# Patient Record
Sex: Male | Born: 1940
Health system: Southern US, Community
[De-identification: ages and names within clinical notes are randomized; demographics above are authoritative.]

## PROBLEM LIST (undated history)

## (undated) DIAGNOSIS — G4730 Sleep apnea, unspecified: Secondary | ICD-10-CM

## (undated) DIAGNOSIS — J449 Chronic obstructive pulmonary disease, unspecified: Secondary | ICD-10-CM

## (undated) DIAGNOSIS — I509 Heart failure, unspecified: Secondary | ICD-10-CM

## (undated) DIAGNOSIS — C439 Malignant melanoma of skin, unspecified: Secondary | ICD-10-CM

## (undated) DIAGNOSIS — E119 Type 2 diabetes mellitus without complications: Secondary | ICD-10-CM

## (undated) DIAGNOSIS — M1990 Unspecified osteoarthritis, unspecified site: Secondary | ICD-10-CM

## (undated) DIAGNOSIS — I4892 Unspecified atrial flutter: Secondary | ICD-10-CM

## (undated) DIAGNOSIS — Z923 Personal history of irradiation: Secondary | ICD-10-CM

## (undated) DIAGNOSIS — IMO0001 Reserved for inherently not codable concepts without codable children: Secondary | ICD-10-CM

## (undated) DIAGNOSIS — H269 Unspecified cataract: Secondary | ICD-10-CM

## (undated) DIAGNOSIS — K635 Polyp of colon: Secondary | ICD-10-CM

## (undated) DIAGNOSIS — Z5189 Encounter for other specified aftercare: Secondary | ICD-10-CM

## (undated) DIAGNOSIS — H9190 Unspecified hearing loss, unspecified ear: Secondary | ICD-10-CM

## (undated) DIAGNOSIS — D649 Anemia, unspecified: Secondary | ICD-10-CM

## (undated) DIAGNOSIS — E7800 Pure hypercholesterolemia, unspecified: Secondary | ICD-10-CM

## (undated) DIAGNOSIS — K219 Gastro-esophageal reflux disease without esophagitis: Secondary | ICD-10-CM

## (undated) DIAGNOSIS — C3490 Malignant neoplasm of unspecified part of unspecified bronchus or lung: Secondary | ICD-10-CM

## (undated) DIAGNOSIS — I10 Essential (primary) hypertension: Secondary | ICD-10-CM

## (undated) DIAGNOSIS — G250 Essential tremor: Secondary | ICD-10-CM

## (undated) DIAGNOSIS — C61 Malignant neoplasm of prostate: Secondary | ICD-10-CM

## (undated) HISTORY — PX: CARDIAC CATHETERIZATION: SHX172

## (undated) HISTORY — PX: NECK SURGERY: SHX720

## (undated) HISTORY — DX: Gastro-esophageal reflux disease without esophagitis: K21.9

## (undated) HISTORY — DX: Heart failure, unspecified: I50.9

## (undated) HISTORY — DX: Chronic obstructive pulmonary disease, unspecified: J44.9

## (undated) HISTORY — DX: Anemia, unspecified: D64.9

## (undated) HISTORY — DX: Malignant neoplasm of unspecified part of unspecified bronchus or lung: C34.90

## (undated) HISTORY — PX: OTHER SURGICAL HISTORY: SHX169

## (undated) HISTORY — DX: Essential tremor: G25.0

## (undated) HISTORY — PX: CATARACT EXTRACTION: SUR2

## (undated) HISTORY — PX: PROSTATE SURGERY: SHX751

## (undated) HISTORY — DX: Malignant melanoma of skin, unspecified: C43.9

## (undated) HISTORY — PX: EYE SURGERY: SHX253

## (undated) HISTORY — DX: Encounter for other specified aftercare: Z51.89

## (undated) HISTORY — DX: Polyp of colon: K63.5

## (undated) HISTORY — DX: Unspecified cataract: H26.9

---

## 2002-10-22 ENCOUNTER — Encounter: Payer: Self-pay | Admitting: Orthopedic Surgery

## 2002-10-27 ENCOUNTER — Inpatient Hospital Stay (HOSPITAL_COMMUNITY): Admission: RE | Admit: 2002-10-27 | Discharge: 2002-10-30 | Payer: Self-pay | Admitting: Orthopedic Surgery

## 2002-10-30 ENCOUNTER — Inpatient Hospital Stay (HOSPITAL_COMMUNITY): Admission: EM | Admit: 2002-10-30 | Discharge: 2002-10-31 | Payer: Self-pay | Admitting: Emergency Medicine

## 2002-10-30 ENCOUNTER — Encounter: Payer: Self-pay | Admitting: Emergency Medicine

## 2002-10-31 ENCOUNTER — Encounter: Payer: Self-pay | Admitting: Internal Medicine

## 2002-11-17 ENCOUNTER — Encounter: Admission: RE | Admit: 2002-11-17 | Discharge: 2003-02-15 | Payer: Self-pay | Admitting: Orthopedic Surgery

## 2003-05-04 ENCOUNTER — Inpatient Hospital Stay (HOSPITAL_COMMUNITY): Admission: RE | Admit: 2003-05-04 | Discharge: 2003-05-07 | Payer: Self-pay | Admitting: Orthopedic Surgery

## 2003-05-25 ENCOUNTER — Encounter: Admission: RE | Admit: 2003-05-25 | Discharge: 2003-08-23 | Payer: Self-pay | Admitting: Orthopedic Surgery

## 2003-08-24 ENCOUNTER — Encounter: Admission: RE | Admit: 2003-08-24 | Discharge: 2003-08-25 | Payer: Self-pay | Admitting: Orthopedic Surgery

## 2005-07-03 HISTORY — PX: TOTAL HIP ARTHROPLASTY: SHX124

## 2005-11-21 ENCOUNTER — Inpatient Hospital Stay (HOSPITAL_COMMUNITY): Admission: RE | Admit: 2005-11-21 | Discharge: 2005-11-24 | Payer: Self-pay | Admitting: Orthopedic Surgery

## 2008-08-03 HISTORY — PX: COLONOSCOPY: SHX174

## 2009-07-03 HISTORY — PX: OTHER SURGICAL HISTORY: SHX169

## 2009-08-26 ENCOUNTER — Ambulatory Visit (HOSPITAL_COMMUNITY): Admission: RE | Admit: 2009-08-26 | Discharge: 2009-08-27 | Payer: Self-pay | Admitting: Orthopedic Surgery

## 2009-09-06 ENCOUNTER — Encounter: Admission: RE | Admit: 2009-09-06 | Discharge: 2009-12-07 | Payer: Self-pay | Admitting: Orthopedic Surgery

## 2009-12-08 ENCOUNTER — Encounter: Admission: RE | Admit: 2009-12-08 | Discharge: 2010-03-08 | Payer: Self-pay | Admitting: Orthopedic Surgery

## 2010-09-22 LAB — COMPREHENSIVE METABOLIC PANEL
ALT: 19 U/L (ref 0–53)
AST: 21 U/L (ref 0–37)
Albumin: 3.9 g/dL (ref 3.5–5.2)
Alkaline Phosphatase: 48 U/L (ref 39–117)
BUN: 8 mg/dL (ref 6–23)
CO2: 29 mEq/L (ref 19–32)
Calcium: 9.1 mg/dL (ref 8.4–10.5)
Chloride: 104 mEq/L (ref 96–112)
Creatinine, Ser: 0.87 mg/dL (ref 0.4–1.5)
GFR calc Af Amer: >60 mL/min (ref >60)
GFR calc non Af Amer: >60 mL/min (ref >60)
Glucose, Bld: 169 mg/dL — ABNORMAL HIGH (ref 70–99)
Potassium: 3.9 mEq/L (ref 3.5–5.1)
Sodium: 138 mEq/L (ref 135–145)
Total Bilirubin: 0.8 mg/dL (ref 0.3–1.2)
Total Protein: 6.4 g/dL (ref 6.0–8.3)

## 2010-09-22 LAB — PROTIME-INR
INR: 1.06 (ref 0.00–1.49)
Prothrombin Time: 13.7 seconds (ref 11.6–15.2)

## 2010-09-22 LAB — URINE MICROSCOPIC-ADD ON

## 2010-09-22 LAB — GLUCOSE, CAPILLARY
Glucose-Capillary: 105 mg/dL — ABNORMAL HIGH (ref 70–99)
Glucose-Capillary: 169 mg/dL — ABNORMAL HIGH (ref 70–99)
Glucose-Capillary: 225 mg/dL — ABNORMAL HIGH (ref 70–99)
Glucose-Capillary: 251 mg/dL — ABNORMAL HIGH (ref 70–99)
Glucose-Capillary: 86 mg/dL (ref 70–99)

## 2010-09-22 LAB — URINALYSIS, ROUTINE W REFLEX MICROSCOPIC
Bilirubin Urine: NEGATIVE
Glucose, UA: >1000 mg/dL — AB
Hgb urine dipstick: NEGATIVE
Ketones, ur: NEGATIVE mg/dL
Leukocytes, UA: NEGATIVE
Nitrite: NEGATIVE
Protein, ur: NEGATIVE mg/dL
Specific Gravity, Urine: 1.019 (ref 1.005–1.030)
Urobilinogen, UA: 0.2 mg/dL (ref 0.0–1.0)
pH: 7.5 (ref 5.0–8.0)

## 2010-09-22 LAB — CBC
HCT: 46.4 % (ref 39.0–52.0)
Hemoglobin: 15.7 g/dL (ref 13.0–17.0)
MCHC: 33.9 g/dL (ref 30.0–36.0)
MCV: 93.2 fL (ref 78.0–100.0)
Platelets: 202 10*3/uL (ref 150–400)
RBC: 4.97 MIL/uL (ref 4.22–5.81)
RDW: 13.1 % (ref 11.5–15.5)
WBC: 7.1 10*3/uL (ref 4.0–10.5)

## 2010-09-22 LAB — APTT: aPTT: 28 seconds (ref 24–37)

## 2010-11-18 NOTE — Op Note (Signed)
NAME:  Timothy Guerrero, Timothy Guerrero NO.:  [id number]   MEDICAL RECORD NO.:  [id number]          PATIENT TYPE:  INP   LOCATION:  0007                         FACILITY:  [facility]   PHYSICIAN:  Ollen Gross, M.D.    DATE OF BIRTH:  January 04, 1941   DATE OF PROCEDURE:  11/21/2005  DATE OF DISCHARGE:                                 OPERATIVE REPORT   PREOPERATIVE DIAGNOSIS:  Osteoarthritis, left hip.   POSTOPERATIVE DIAGNOSIS:  Osteoarthritis, left hip.   PROCEDURE:  Left total hip arthroplasty.   SURGEON:  Ollen Gross, M.D.   ASSISTANT:  Alexzandrew L. Julien Girt, P.A.   ANESTHESIA:  General.   ESTIMATED BLOOD LOSS:  300.   DRAINS:  Hemovac times one.   COMPLICATIONS:  None.  Condition stable to recovery.   BRIEF CLINICAL NOTE:  Timothy Guerrero is a 70 year old male with end-stage  osteoarthritis of left hip with intractable pain.  He presents now for left  total hip arthroplasty.   PROCEDURE IN DETAIL:  After successful administration of general anesthetic,  the patient was placed in right lateral decubitus position, left side up and  held with the hip positioner.  Left lower extremity was isolated from his  perineum with plastic drapes and prepped and draped in usual sterile  fashion.  Short posterolateral incision is made with 10 blade through subcu  tissue to level of the fascia lata which was incised in line with the skin  incision.  Sciatic nerve is palpated and protected and short rotators  isolated off the femur.  Capsulectomy is performed and hip dislocated.  Center of femoral head is marked and a trial prosthesis placed such that the  center of trial head corresponds to the center of his native femoral head.  Osteotomy lines marked on the femoral neck and osteotomy made with an  oscillating saw.  Femoral head removed and the hip retracted anteriorly to  gain acetabular closure.   Acetabular tractors were placed.  Acetabular osteophytes and labrum were  removed.   Acetabular reaming begins at 47 mm coursing in increments of 2 up  to 57 mm and a 58 mm Pinnacle acetabular shell was placed in anatomic  position and transfixed with two dome screws.  Trial 36 mm neutral liner was  placed.   The femur was prepared with the canal finder irrigation.  Axial reaming is  performed up to 15.5 mm, proximal reaming to a 20 F.  The sleeve machine to  a large.  20 F large sleeve was placed with a 20 x 15 stem and 36 + 12 neck.  We matched his native anteversion.  The 36+ 0 head and reduced quite easily.  With 36 + 6 the tension was more appropriate.  He had full extension, full  external rotation 70 degrees flexion, 40 degrees abduction, 90 degrees  internal rotation, 90 degrees flexion, 70 degrees internal rotation.  By  placing the left leg on top of the right, he was still a few millimeters  short.  At this time I decided to proceed with the extra large head and  liner  going to 40 mm.  This lead to more effective restoration of leg  length.  The trials were then removed and the permanent apex hole eliminator  was placed in the acetabular shell.  Permanent 40 mm neutral Ultamet metal  liner was placed.  It is a metal-on-metal hip replacement.  The permanent 20  F large sleeve is placed in the proximal femur with a 20 x 15 stem and 36  plus 12 neck again matching his native anteversion.  40 + 6 head is placed  and the hip is reduced to the same stability parameters.  Leg lengths were  then equalized.  Wound was copiously irrigated with saline solution and the  short rotators reattached the femur through drill holes.  Fascia was closed  over Hemovac drain with interrupted #1 Vicryl, subcu closed #1-0 and #2-0  Vicryl subcuticular running 4-0 Monocryl.  Incisions cleaned and dried and  Steri-Strips and bulky sterile dressing applied.  He was then awakened and  transported to recovery in stable condition.      Ollen Gross, M.D.  Electronically Signed      FA/MEDQ  D:  11/21/2005  T:  11/22/2005  Job:  [id number]

## 2010-11-18 NOTE — H&P (Signed)
NAME:  Timothy Guerrero, Timothy Guerrero                ACCOUNT NO.:  [id number]   MEDICAL RECORD NO.:  [id number]          PATIENT TYPE:  INP   LOCATION:  NA                           FACILITY:  [facility]   PHYSICIAN:  Ollen Gross, M.D.    DATE OF BIRTH:  10/20/1940   DATE OF ADMISSION:  11/21/2005  DATE OF DISCHARGE:                                HISTORY & PHYSICAL   DATE OF OFFICE VISIT HISTORY AND PHYSICAL:  Nov 08, 2005.   CHIEF COMPLAINT:  Left hip pain.   HISTORY OF PRESENT ILLNESS:  This is a 70 year old male who has been seen by  Dr. Lequita Halt for ongoing knee pain and also hip pain.  He previously has  undergone a right total knee replacement back in October of 2004, and also a  left total knee replacement about a year ago.  He has done extremely well  with both procedures.  Unfortunately, he has gone on to have continued  problems with his left hip, especially over the past 6 months.  He was seen  in the office where x-rays show end-stage arthritis of the left hip with  bone-on-bone formation.  The right hip looks normal.  It is felt the most  predictable means of improvement in his pain and function is hip  replacement.  Risks and benefits have been discussed, and the patient has  elected to proceed with surgery.   ALLERGIES:  CODEINE causes a rash.   CURRENT MEDICATIONS:  1.  Glipizide.  2.  Hydrochlorothiazide.  3.  Metformin.  4.  Atenolol.  5.  Lisinopril.   PAST MEDICAL HISTORY:  1.  Hypertension.  2.  History of prostate cancer.  3.  Type 2 diabetes mellitus.  4.  Benign essential tremor.   PAST SURGICAL HISTORY:  1.  Prostate surgery.  2.  Left knee ligament surgery.  3.  Left total knee arthroplasty.  4.  Right total knee arthroplasty.   SOCIAL HISTORY:  Married.  Retired.  Nonsmoker.  No alcohol.  Has 5  children.   FAMILY HISTORY:  Significant for a sister with hypertension.  Also a history  of liver disease in the family.   REVIEW OF SYSTEMS:  GENERAL:  No fever,  chills, night sweats.  NEUROLOGIC:  No seizures, syncope or paralysis.  RESPIRATORY:  No shortness of breath,  productive cough or hemoptysis.  CARDIOVASCULAR:  No chest pain, angina or  orthopnea.  GI:  No nausea, vomiting, diarrhea or constipation.  GU:  No  dysuria, hematuria or discharge.  MUSCULOSKELETAL:  Left hip.   PHYSICAL EXAMINATION:  VITAL SIGNS:  Pulse of 56, respirations 12, blood  pressure 142/72.  GENERAL:  A 70 year old white male, well-nourished, well-developed, in no  acute distress.  Alert, oriented and cooperative.  HEENT:  Normocephalic and atraumatic.  Pupils round and reactive.  Oropharynx clear.  Extraocular movements intact.  Does have partial lower  denture.  NECK:  Supple.  CHEST:  Clear, anterior and posterior chest walls.  No rhonchi, rales or  wheezing.  HEART:  Regular rate and rhythm.  No murmur.  S1 and S2 noted.  ABDOMEN:  Soft, nontender.  Bowel sounds present.  RECTAL/BREASTS/GENITALIA:  Not done.  No pertinent to present illness.  EXTREMITIES:  Left hip shows flexion of only 95 degrees.  There is zero  internal rotation, 20 degrees of external rotation, about 20 degrees of  abduction.   IMPRESSION:  1.  Osteoarthritis, left hip.  2.  Hypertension.  3.  History of prostate cancer.  4.  Type 2 diabetes mellitus.  5.  Benign essential tremor.   PLAN:  The patient was admitted to [facility] to undergo a left  total hip arthroplasty.  Surgery will be performed by Dr. Ollen Gross.      Alexzandrew L. Julien Girt, P.A.      Ollen Gross, M.D.  Electronically Signed    ALP/MEDQ  D:  11/20/2005  T:  11/20/2005  Job:  [id number]

## 2010-11-18 NOTE — Consult Note (Signed)
   NAME:  Timothy Guerrero, Timothy Guerrero                    ACCOUNT NO.:  [id number]   MEDICAL RECORD NO.:  [id number]                   PATIENT TYPE:  INP   LOCATION:  0004                                 FACILITY:  [facility]   PHYSICIAN:  Maretta Bees. Vonita Moss, M.D.             DATE OF BIRTH:  07-01-1941   DATE OF CONSULTATION:  10/27/2002  DATE OF DISCHARGE:                                   CONSULTATION   REASON FOR CONSULTATION:  I was asked to see this 70 year old white male  after he was put to sleep for knee surgery.  His Foley catheter could not be  inserted.  Review of the records reveal that he had a radical retropubic  prostatectomy in the past, and while checking the records that he very well  be my patient.  The OR staff could not insert a catheter which was necessary  for postoperative care.   PHYSICAL EXAMINATION:  ABDOMEN:  Soft and nontender.  GENITOURINARY:  Penis, urethra, meatus, scrotum, testicles, epididymis were  unremarkable.  RECTAL:  Prostate __________ rectal examination.   I inserted a Foley catheter.  It triggered a hang up at the bladder neck, so  I then dilated him with __________ from 16 to 20 [place], and then inserted a  16 [place] two-day catheter with return of clear urine.  Foley catheter was  attached to closed drainage, and we will follow this gentleman  postoperatively.                                               Maretta Bees. Vonita Moss, M.D.    LJP/MEDQ  D:  10/27/2002  T:  10/27/2002  Job:  [id number]   cc:   Ollen Gross, M.D.  [address]  [place]  [place] [address]  Fax: [phone]

## 2010-11-18 NOTE — Discharge Summary (Signed)
Timothy Guerrero, Timothy Guerrero                ACCOUNT NO.:  [id number]   MEDICAL RECORD NO.:  [id number]          PATIENT TYPE:  INP   LOCATION:  1605                         FACILITY:  [facility]   PHYSICIAN:  Ollen Gross, M.D.    DATE OF BIRTH:  [birth year]   DATE OF ADMISSION:  11/21/2005  DATE OF DISCHARGE:  11/24/2005                                 DISCHARGE SUMMARY   ADMISSION DIAGNOSES:  1.  Osteoarthritis of the left hip.  2.  Hypertension.  3.  History of prostate cancer.  4.  Type 2 diabetes mellitus.  5.  Benign essential tremor.   DISCHARGE DIAGNOSES:  1.  Osteoarthritis of the left hip status post left total hip arthroplasty.  2.  Mild postoperative blood loss anemia, did not require transfusion.  3.  Postoperative hypokalemia.  4.  Hypertension.  5.  History of prostate cancer.  6.  Type 2 diabetes mellitus.  7.  Benign essential tremor.   PROCEDURE:  On Nov 21, 2005 left total hip surgery by Dr. Lequita Halt, assistant  Alexzandrew Julien Girt, P. A.  Anesthesia, general.   CONSULTANTS:  None.   BRIEF HISTORY:  Timothy Guerrero is a 70 year old male with end-stage  osteoarthritis of the left hip with intractable pain who now presents for  total hip arthroplasty.   LABORATORY DATA:  Hemoglobin 15.5, hematocrit 45.7, hemoglobin dropped to  12.3.  Last admission it was 11.6 and 33.4.  PT and PTT preop 12.8 and 28  respectively.  INR 0.9.  Serial pro time is followed __________ and 1.7.  Chem panel on admission all within normal limits with the exception of  elevated glucose of 266.  Serial BMETs were followed.  His potassium did  drop from 4.1 to 3.7, last noted at 3.2.  Glucose improved to 135 and came  back up to 200 prior to discharge.  Preop UA positive glucose otherwise  negative.  Blood group type O positive.  EKG May 2007, unable to read date,  sinus rhythm, occasional premature contractures otherwise normal,  unconfirmed.  Left hip films Nov 15, 2005 advanced degenerative OA  changes  left hip.  Two-view chest Nov 15, 2005, mild chronic interstitial changes,  no acute cardiopulmonary process.  Left hip and pelvis films Nov 21, 2005,  satisfactory clearance left total hip.   HOSPITAL COURSE:  Admitted to [facility], tolerated the procedure  well, later to the recovery room and orthopedic floor.  Started on PCA and  p.o. analgesic for pain control following surgery.  Doing okay but was not  able to get much sleep and stayed in the PACU through the night and then  later transferred to the floor later that day, had been seen in rounds by  Dr. Lequita Halt.  Hemovac drain was pulled.  PCA was discontinued later that  morning.  Fluids were decreased.  Started to get up out of bed with physical  therapy and once she got up to the floor by day 2 she was doing much better,  under better control, being weaned over to p.o. meds, actually got up an  ambulated 120 feet, doing very well.  Dressings were changed.  Incision  looked good.  Doing so well felt to be ready to go home the following day.  Seen in rounds and discharged home on Nov 24, 2005.   DISCHARGE PLANS:  1.  The patient is discharged home on Nov 24, 2005.  2.  Discharge diagnosis, please see above.   DISCHARGE MEDICATIONS:  Coumadin, Percocet and Robaxin.   DIET:  Diabetic diet.   FOLLOW UP:  Follow up in 2 weeks.  Call for an appointment.   ACTIVITY:  Partial weightbearing 25-50% left lower extremity.  Hip  precautions, total hip protocol.   DISPOSITION:  Home.   CONDITION ON DISCHARGE:  Stable and improved.      Alexzandrew L. Julien Girt, P.A.      Ollen Gross, M.D.  Electronically Signed    ALP/MEDQ  D:  01/03/2006  T:  01/03/2006  Job:  [id number]   cc:   Ollen Gross, M.D.  Fax: [phone]

## 2013-06-13 ENCOUNTER — Emergency Department (HOSPITAL_COMMUNITY): Payer: Medicare Other

## 2013-06-13 ENCOUNTER — Emergency Department (HOSPITAL_COMMUNITY)
Admission: EM | Admit: 2013-06-13 | Discharge: 2013-06-14 | Disposition: A | Discharge status: Home / Self Care | Payer: Medicare Other | Attending: Emergency Medicine | Admitting: Emergency Medicine

## 2013-06-13 ENCOUNTER — Encounter (HOSPITAL_COMMUNITY): Payer: Self-pay | Admitting: Emergency Medicine

## 2013-06-13 DIAGNOSIS — E119 Type 2 diabetes mellitus without complications: Secondary | ICD-10-CM | POA: Insufficient documentation

## 2013-06-13 DIAGNOSIS — I10 Essential (primary) hypertension: Secondary | ICD-10-CM | POA: Insufficient documentation

## 2013-06-13 DIAGNOSIS — Z862 Personal history of diseases of the blood and blood-forming organs and certain disorders involving the immune mechanism: Secondary | ICD-10-CM | POA: Insufficient documentation

## 2013-06-13 DIAGNOSIS — M19029 Primary osteoarthritis, unspecified elbow: Secondary | ICD-10-CM | POA: Insufficient documentation

## 2013-06-13 DIAGNOSIS — Z8639 Personal history of other endocrine, nutritional and metabolic disease: Secondary | ICD-10-CM | POA: Insufficient documentation

## 2013-06-13 DIAGNOSIS — M7021 Olecranon bursitis, right elbow: Secondary | ICD-10-CM

## 2013-06-13 DIAGNOSIS — M7020 Olecranon bursitis, unspecified elbow: Secondary | ICD-10-CM | POA: Insufficient documentation

## 2013-06-13 HISTORY — DX: Pure hypercholesterolemia, unspecified: E78.00

## 2013-06-13 HISTORY — DX: Type 2 diabetes mellitus without complications: E11.9

## 2013-06-13 HISTORY — DX: Essential (primary) hypertension: I10

## 2013-06-13 MED ORDER — IBUPROFEN 400 MG PO TABS
400.0000 mg | ORAL_TABLET | Freq: Once | ORAL | Status: AC
  Administered 2013-06-13: 400 mg via ORAL
  Filled 2013-06-13: qty 1

## 2013-06-13 MED ORDER — HYDROCODONE-ACETAMINOPHEN 5-325 MG PO TABS
1.0000 | ORAL_TABLET | Freq: Once | ORAL | Status: DC
  Filled 2013-06-13: qty 1

## 2013-06-13 NOTE — ED Provider Notes (Signed)
CSN: [id number]     Arrival date & time 06/13/13  2156 History   First MD Initiated Contact with Patient 06/13/13 2235     Chief Complaint  Patient presents with  . Elbow Pain   (Consider location/radiation/quality/duration/timing/severity/associated sxs/prior Treatment) HPI Comments: Chief Complaint: right elbow pain/swelling. Pt is a 72 y/o male  who presents to the ED with c/o right elbow pain. Pt states he noticed this today about 3pm, and the swelling and pain have been getting worse since that time. Pt does not recall any injury to the elbow. He did notice a abrasion yesterday. No reported fever or chills. No previous operations or procedures reported.No problem with the left elbow. Pt does state he had a similar problem with the right knee and had this drained at the [facility] office.  The history is provided by the patient.    Past Medical History  Diagnosis Date  . Diabetes mellitus without complication   . Hypertension   . Hypercholesteremia    Past Surgical History  Procedure Laterality Date  . Prostate surgery    . Joint replacement    . Hip and knee replacement.      . Neck surgery     History reviewed. No pertinent family history. History  Substance Use Topics  . Smoking status: Never Smoker   . Smokeless tobacco: Not on file  . Alcohol Use: No    Review of Systems  Constitutional: Negative for activity change.       All ROS Neg except as noted in HPI  HENT: Negative for nosebleeds.   Eyes: Negative for photophobia and discharge.  Respiratory: Negative for cough, shortness of breath and wheezing.   Cardiovascular: Negative for chest pain and palpitations.  Gastrointestinal: Negative for abdominal pain and blood in stool.  Genitourinary: Negative for dysuria, frequency and hematuria.  Musculoskeletal: Positive for arthralgias. Negative for back pain and neck pain.  Skin: Negative.   Neurological: Negative for dizziness, seizures and speech  difficulty.  Psychiatric/Behavioral: Negative for hallucinations and confusion.    Allergies  Review of patient's allergies indicates no known allergies.  Home Medications  No current outpatient prescriptions on file. BP 162/76  Pulse 81  Temp(Src) 98.6 F (37 C) (Oral)  Resp 20  Ht 6' (1.829 m)  Wt 215 lb (97.523 kg)  BMI 29.15 kg/m2  SpO2 97% Physical Exam  Nursing note and vitals reviewed. Constitutional: He is oriented to person, place, and time. He appears well-developed and well-nourished.  Non-toxic appearance.  HENT:  Head: Normocephalic.  Right Ear: Tympanic membrane and external ear normal.  Left Ear: Tympanic membrane and external ear normal.  Eyes: EOM and lids are normal. Pupils are equal, round, and reactive to light.  Neck: Normal range of motion. Neck supple. Carotid bruit is not present.  Cardiovascular: Normal rate, regular rhythm, normal heart sounds, intact distal pulses and normal pulses.   Pulmonary/Chest: Breath sounds normal. No respiratory distress.  Abdominal: Soft. Bowel sounds are normal. There is no tenderness. There is no guarding.  Musculoskeletal: Normal range of motion.  Increase redness, and tenderness and mod swelling of the right elbow, mostly at the olecranon area.  Lymphadenopathy:       Head (right side): No submandibular adenopathy present.       Head (left side): No submandibular adenopathy present.    He has no cervical adenopathy.  Neurological: He is alert and oriented to person, place, and time. He has normal strength. No cranial nerve deficit  or sensory deficit.  Skin: Skin is warm and dry.  Psychiatric: He has a normal mood and affect. His speech is normal.    ED Course  Procedures (including critical care time) Labs Review Labs Reviewed - No data to display Imaging Review No results found.  EKG Interpretation   None       MDM  No diagnosis found. *I have reviewed nursing notes, vital signs, and all appropriate lab  and imaging results for this patient.** Xray of the elbow reveals DJD changes and question of a spur on the radial head. Temp 98.6, vital signs stable. Plan - keflex and doxycycline ordered. Continue ibuprofen 400mg  bid and add norco for pain. Pt to see MD at [facility] for additional evaluation. Pt placed in a sling.   Kathie Dike, PA-C 06/14/13 0010

## 2013-06-13 NOTE — ED Notes (Signed)
Red swollen rt elbow

## 2013-06-14 MED ORDER — CEPHALEXIN 500 MG PO CAPS
500.0000 mg | ORAL_CAPSULE | Freq: Once | ORAL | Status: AC
  Administered 2013-06-14: 500 mg via ORAL
  Filled 2013-06-14: qty 1

## 2013-06-14 MED ORDER — ONDANSETRON HCL 4 MG PO TABS
4.0000 mg | ORAL_TABLET | Freq: Once | ORAL | Status: AC
  Administered 2013-06-14: 4 mg via ORAL
  Filled 2013-06-14: qty 1

## 2013-06-14 MED ORDER — DOXYCYCLINE HYCLATE 100 MG PO TABS
100.0000 mg | ORAL_TABLET | Freq: Once | ORAL | Status: AC
  Administered 2013-06-14: 100 mg via ORAL
  Filled 2013-06-14: qty 1

## 2013-06-14 MED ORDER — DOXYCYCLINE HYCLATE 100 MG PO CAPS: 100.0000 mg | ORAL_CAPSULE | Freq: Two times a day (BID) | ORAL | Status: AC

## 2013-06-14 MED ORDER — CEPHALEXIN 500 MG PO CAPS: 500.0000 mg | ORAL_CAPSULE | Freq: Four times a day (QID) | ORAL | Status: DC

## 2013-06-14 MED ORDER — HYDROCODONE-ACETAMINOPHEN 5-325 MG PO TABS: 1.0000 | ORAL_TABLET | Freq: Four times a day (QID) | ORAL | Status: DC | PRN

## 2013-06-14 NOTE — ED Notes (Signed)
Pt alert & oriented x4, stable gait. Patient given discharge instructions, paperwork & prescription(s). Patient  instructed to stop at the registration desk to finish any additional paperwork. Patient verbalized understanding. Pt left department w/ no further questions. 

## 2013-06-14 NOTE — ED Provider Notes (Signed)
Elderly male with right bursal effusion of the elbow, minimally tender, no erythema or induration, no significant tenderness with range of motion of the elbow but does have tenderness with palpation over the olecranon. Normal grip, normal range of motion of the wrist and the shoulder, no other joint complaints. Afebrile. Pain medication, sling, orthopedic referral with antibiotic in case this has become infected. He does not have a septic arthritis though he may have infected bursa. Patient amenable to plan.  Medical screening examination/treatment/procedure(s) were conducted as a shared visit with non-physician practitioner(s) and myself.  I personally evaluated the patient during the encounter.        Vida Roller, MD 06/14/13 [phone]

## 2013-11-04 ENCOUNTER — Telehealth: Payer: Self-pay

## 2013-11-04 NOTE — Telephone Encounter (Signed)
Pt returned call. Scheduled for OV with Neil Crouch, PA on 12/08/2013 at 8:30 AM.

## 2013-11-04 NOTE — Telephone Encounter (Signed)
Pt was referred by [facility] in [place] for colonoscopy. Has hx of polyps. I called and left message for a return call to schedule OV first.

## 2013-11-05 NOTE — Telephone Encounter (Signed)
LMOM at the [facility] for Timothy Guerrero that date and time of the appt. [phone]).

## 2013-12-08 ENCOUNTER — Other Ambulatory Visit: Payer: Self-pay

## 2013-12-08 ENCOUNTER — Telehealth: Payer: Self-pay

## 2013-12-08 ENCOUNTER — Encounter: Payer: Self-pay | Admitting: Gastroenterology

## 2013-12-08 ENCOUNTER — Ambulatory Visit (INDEPENDENT_AMBULATORY_CARE_PROVIDER_SITE_OTHER): Payer: Medicare PPO | Admitting: Gastroenterology

## 2013-12-08 VITALS — BP 160/94 | HR 66 | Temp 97.2°F | Resp 20 | Ht 72.0 in | Wt 214.0 lb

## 2013-12-08 DIAGNOSIS — Z860100 Personal history of colon polyps, unspecified: Secondary | ICD-10-CM | POA: Diagnosis not present

## 2013-12-08 DIAGNOSIS — Z1211 Encounter for screening for malignant neoplasm of colon: Secondary | ICD-10-CM

## 2013-12-08 NOTE — Telephone Encounter (Signed)
Patient is straightforward without any problems. Limited time available given provider vacation. Ok to [employer].

## 2013-12-08 NOTE — Assessment & Plan Note (Signed)
Due for surveillance colonoscopy. He has already been given bowel prep and will call us with name so we can give instructions.  I have discussed the risks, alternatives, benefits with regards to but not limited to the risk of reaction to medication, bleeding, infection, perforation and the patient is agreeable to proceed. Written consent to be obtained.

## 2013-12-08 NOTE — Progress Notes (Signed)
Repeat BP improved as noted.

## 2013-12-08 NOTE — Telephone Encounter (Signed)
Pt called and said he has a jug of the Golytely and 4 ducolax tablets which are 5 mg each.  He is now scheduled for 01/12/2014 at 7:30 AM with Dr. Gala Romney for colonoscopy.  He had to be scheduled out since he is diabetic and needs an Am appt. The only thing Dr. Gala Romney had was on 12/25/2013 which would be needed for an OR procedure.   I have made a notation to update triage prior to his procedure.   Mailed the instructions.

## 2013-12-08 NOTE — Patient Instructions (Signed)
Colonoscopy as scheduled. See separate instructions.  

## 2013-12-08 NOTE — Progress Notes (Signed)
Primary Care Physician:  No PCP Per Patient  Primary Gastroenterologist:  Garfield Cornea, MD   Chief Complaint  Patient presents with  . Referral    new patient    HPI:  Timothy Guerrero is a 73 y.o. male here referred by the [facility] for surveillance colonoscopy with history of adenomatous colon polyps. Patient had a colonoscopy in February 2010 by Dr. Leafy Half at [facility]. He had internal grade 1 hemorrhoids. Prior colonoscopy by Dr. Marene Lenz in 2007 however showed polyp with early cancer requiring a one year followup exam which showed no residual polyp. Therefore patient is due for five-year surveillance colonoscopy.  He denies any constipation, diarrhea, melena, rectal bleeding, abdominal pain, vomiting, dysphagia. He seldom has heartburn and takes omeprazole when necessary.   Current Outpatient Prescriptions  Medication Sig Dispense Refill  . aspirin 325 MG tablet Take 325 mg by mouth daily.      [name] atenolol (TENORMIN) 100 MG tablet Take 100 mg by mouth daily.      [name] glipiZIDE (GLUCOTROL) 10 MG tablet Take 10 mg by mouth daily.      . hydrochlorothiazide (HYDRODIURIL) 25 MG tablet Take 25 mg by mouth daily.      [name] ibuprofen (ADVIL,MOTRIN) 400 MG tablet Take 400 mg by mouth 2 (two) times daily as needed. For pain      . lisinopril (PRINIVIL,ZESTRIL) 40 MG tablet Take 40 mg by mouth daily.      . metFORMIN (GLUCOPHAGE) 1000 MG tablet Take 1,000 mg by mouth 2 (two) times daily.      [name] omeprazole (PRILOSEC) 20 MG capsule Take 20 mg by mouth 2 (two) times daily as needed. For acid reflux      . simvastatin (ZOCOR) 40 MG tablet Take 40 mg by mouth at bedtime.       No current facility-administered medications for this visit.    Allergies as of 12/08/2013  . (No Known Allergies)    Past Medical History  Diagnosis Date  . Diabetes mellitus without complication   . Hypertension   . Hypercholesteremia   . Colon polyp     2007, polyp  with early cancer, one year f/u no residual polyp. next TCS 2010, see P[facility]. [facility].    Past Surgical History  Procedure Laterality Date  . Prostate surgery    . Knee replacements      bilateral. over 10 years ago  . Neck surgery    . Total hip arthroplasty  2007    left  . Total shoulder replacement  2011    left  . Colonoscopy  08/2008    Dr. Leafy Half, [place]: normal, internal grade 1 hemorrhoids. next TCS 08/2013    Family History  Problem Relation Age of Onset  . Colon cancer Neg Hx     History   Social History  . Marital Status: Legally Separated    Spouse Name: N/A    Number of Children: N/A  . Years of Education: N/A   Occupational History  . Not on file.   Social History Main Topics  . Smoking status: Never Smoker   . Smokeless tobacco: Not on file  . Alcohol Use: No  . Drug Use: No  . Sexual Activity: Not on file   Other Topics Concern  . Not on file   Social History Narrative   [employer].      ROS:  General: Negative  for anorexia, weight loss, fever, chills, fatigue, weakness. Eyes: Negative for vision changes.  ENT: Negative for hoarseness, difficulty swallowing , nasal congestion. CV: Negative for chest pain, angina, palpitations, dyspnea on exertion, peripheral edema.  Respiratory: Negative for dyspnea at rest, dyspnea on exertion, cough, sputum, wheezing.  GI: See history of present illness. GU:  Negative for dysuria, hematuria, urinary incontinence, urinary frequency, nocturnal urination.  MS: Negative for joint pain, low back pain.  Derm: Negative for rash or itching.  Neuro: Negative for weakness, abnormal sensation, seizure, frequent headaches, memory loss, confusion.  Psych: Negative for anxiety, depression, suicidal ideation, hallucinations.  Endo: Negative for unusual weight change.  Heme: Negative for bruising or bleeding. Allergy: Negative for rash or hives.    Physical Examination:  BP  191/90  Pulse 66  Temp(Src) 97.2 F (36.2 C) (Oral)  Resp 20  Ht 6' (1.829 m)  Wt 214 lb (97.07 kg)  BMI 29.02 kg/m2   General: Well-nourished, well-developed in no acute distress.  Head: Normocephalic, atraumatic.   Eyes: Conjunctiva pink, no icterus. Mouth: Oropharyngeal mucosa moist and pink , no lesions erythema or exudate. Neck: Supple without thyromegaly, masses, or lymphadenopathy.  Lungs: Clear to auscultation bilaterally.  Heart: Regular rate and rhythm, no murmurs rubs or gallops.  Abdomen: Bowel sounds are normal, nontender, nondistended, no hepatosplenomegaly or masses, no abdominal bruits or    hernia , no rebound or guarding.   Rectal: not performed Extremities: No lower extremity edema. No clubbing or deformities.  Neuro: Alert and oriented x 4 , grossly normal neurologically.  Skin: Warm and dry, no rash or jaundice.   Psych: Alert and cooperative, normal mood and affect.

## 2013-12-08 NOTE — Progress Notes (Signed)
No PCP 

## 2013-12-30 ENCOUNTER — Encounter (HOSPITAL_COMMUNITY): Payer: Self-pay | Admitting: Pharmacy Technician

## 2013-12-31 ENCOUNTER — Encounter: Payer: Self-pay | Admitting: Physician Assistant

## 2013-12-31 ENCOUNTER — Ambulatory Visit (INDEPENDENT_AMBULATORY_CARE_PROVIDER_SITE_OTHER): Payer: Commercial Managed Care - HMO | Admitting: Physician Assistant

## 2013-12-31 VITALS — BP 118/80 | HR 72 | Temp 97.9°F | Ht 72.0 in | Wt 206.0 lb

## 2013-12-31 DIAGNOSIS — C439 Malignant melanoma of skin, unspecified: Secondary | ICD-10-CM | POA: Insufficient documentation

## 2013-12-31 NOTE — Patient Instructions (Signed)
Melanoma Melanoma is the least common, but most dangerous, form of skin cancer. This is because it can spread (metastasize) to other organs and can be life-threatening. Melanoma is a cancerous (malignant) tumor that begins in a certain type of cells, called melanocytes. Melanocytes are the cells that produce the color (pigment) called melanin. Melanin colors our skin, hair, eyes, and moles. CAUSES  The exact cause of melanoma is unknown. You may have a higher risk if you:  Spend or have spent a lot of time in the sun. This includes sunlamp and tanning booth exposure.  Have had sunburns. This put you at a particularly increased risk for melanoma. The more blistering sunburns a person has, the higher the risk.  Spend time in parts of the world with more intense sunlight.  Have fair skin that does not tan easily. You may have a lower risk if you have a darker skin color. However, people with darker skin can get melanoma, especially on the hands and feet (acral areas).  Have a close relative (parent, sibling) who has melanoma.  Have a large number of skin moles (more than 100). SYMPTOMS  A skin mole is suspicious if it has any of these 5 traits. This is called the ABCDE's of melanoma:  Asymmetry: Irregular shape, not simply round or oval.  Border: Edge of the mole is irregular, not smooth.  Color: Mole may have multiple colors in it, including brown, black, blue, red, or tan.  Diameter: More than 0.2 inches (6 mm) across.  Evolving: Any unusual change or symptoms in the mole, such as pain, itching, stinging, sensitivity, or bleeding. A mole that is noticeably changing in appearance, or any new mole, should be checked for melanoma. In general, people develop new moles until age 76. New moles after this age should be brought to the attention of your caregiver. DIAGNOSIS  Your caregiver can look at your skin and find lesions or moles that may be suspicious. A patient may also notice a mole  with symptoms or a mole that does not look like most of the other moles on his or her body. This is called the "ugly duckling" sign. A tissue sample (biopsy) examined under a microscope is needed to determine if it is melanoma. The size and extent of the biopsy will depend on the location, size, and appearance of the skin lesion or mole. The biopsy can also reveal whether melanoma has spread to deeper layers of the skin. TREATMENT  Surgery to completely remove the melanoma is required. Lymph nodes may also be removed. If the melanoma has spread to other organs, such as the liver, lungs, bone, or brain, cancer-fighting drugs (chemotherapy) must be used. Your caregiver will discuss your treatment options with you. You can ask about being included in a clinical trial to evaluate new forms of treatment. Melanoma can occasionally recur years after the initial diagnosis. If you have melanoma, you will need follow-up visits with your caregiver for many years. PREVENTION  Risk for melanoma can be reduced by minimizing sun exposure. Practice the 3 S's:  Slip on a shirt.  Slop on sunscreen.  Slap on a hat. Do not spend time in the sun during peak midafternoon hours. Sunscreen/sunblock with SPF 30 or higher and UVA/UVB block should be applied regularly. You should do this even during brief exposure to sunlight. You should also do this on cloudy days and in winter, even though the perceived sunlight is less. Always avoid sunburn! Wear sunglasses that block UV  light. Be sure to see your caregiver if you have any new or changing moles. HOME CARE INSTRUCTIONS   Follow wound care instructions after surgical removal of your melanoma.  Practice good sun avoidance and protective measures as described above.  Let your close family members (parents, children, siblings) know about your diagnosis. This puts them at a higher risk of getting melanoma than the general population. SEEK MEDICAL CARE IF:   You notice any  new moles, or you have any moles that are changing.  You have had a melanoma removed and you notice a new growth near the same location.  You have had a melanoma removed and you experience any new or unexplained health problems. Document Released: 06/19/2005 Document Revised: 09/11/2011 Document Reviewed: 10/08/2009 [facility] Patient Information 2015 [place], [place]. This information is not intended to replace advice given to you by your health care provider. Make sure you discuss any questions you have with your health care provider.

## 2013-12-31 NOTE — Progress Notes (Signed)
Subjective:     Patient ID: Timothy Guerrero, male   DOB: September 17, 1940, 73 y.o.   MRN: [id number]  HPI Pt is here as a new pt He [facility] with a hx of melanoma to the L shoulder and post cerv region He is currently under a 3 month surveillance program The ins co he is currently with would not allow him to f/u w/o referral and they did not recognize the [place] physicians Pt just needing referral  Review of Systems  Skin: Negative.        Objective:   Physical Exam Well healed surgical lesions to the lateral L shoulder and L cervical area    Assessment:     Melanoma    Plan:     Referral back to the Derm F/U here prn

## 2014-01-07 ENCOUNTER — Telehealth: Payer: Self-pay

## 2014-01-07 NOTE — Telephone Encounter (Signed)
Pt was seen in the office by Neil Crouch, PA on 12/08/2013. Scheduled his colonoscopy for 01/12/2014 due to vacations and needed early appt due to being a diabetic.  I have called pt and he has not had any change in his medications and no new problems since he was seen in the office on 12/08/2013.

## 2014-01-08 ENCOUNTER — Telehealth: Payer: Self-pay

## 2014-01-08 NOTE — Telephone Encounter (Signed)
That should be fine. He is straightforward surveillance TCS for h/o polyps.

## 2014-01-08 NOTE — Telephone Encounter (Signed)
Pt called and said he is not feeling good, seems to have a virus with chills, fever and no appetite.  He has rescheduled his colonoscopy from 01/12/2014 to 02/11/2014 at 9:30 AM and is aware to be at the hospital at 8:30 AM. Maudie Mercury is aware.  He is aware that I will have to update triage prior to that appt.

## 2014-01-08 NOTE — Telephone Encounter (Signed)
Routing to Dr. Rourk  

## 2014-01-20 ENCOUNTER — Ambulatory Visit (INDEPENDENT_AMBULATORY_CARE_PROVIDER_SITE_OTHER): Payer: Commercial Managed Care - HMO | Admitting: *Deleted

## 2014-01-20 DIAGNOSIS — Z4800 Encounter for change or removal of nonsurgical wound dressing: Secondary | ICD-10-CM

## 2014-01-20 NOTE — Progress Notes (Signed)
Spoke to patient at front desk. He noticed a possible insect bite to right hand close to wrist 1 week ago while vacationing in [place]. He is concerned that it may be a spider bite. Advised that it is difficult to determine what type of insect unless it was witnessed.  There is a round area of localized erythema approximately 2.5cm wide and an area to the center of approx 2 cm where the top layer of skin has peeled off. The tissue appears healthy and healing. It is a dry wound. No induration, necrosis, drainage, pain or warmth.  Advised patient to continue keeping the area clean with soap and water and to apply an antibiotic ointment and dressing. He should keep it covered most of the day but can remove the bandage at night if he wishes. Offered to clean and dress the wound but patient stated that he has supplies at home and can redress it himself.   Area of erythema was marked with a pen. Advised patient to monitor the size. If it increases in size or if he develops any streaking, increased redness, pain, drainage or warmth he will need to be seen. Patient stated understanding and agreement to plan.

## 2014-02-02 ENCOUNTER — Telehealth: Payer: Self-pay

## 2014-02-02 NOTE — Telephone Encounter (Signed)
Appropriate. Hold diabetes medications the day of the procedure.

## 2014-02-02 NOTE — Telephone Encounter (Signed)
Pt is scheduled for colonoscopy on 02/11/2014. I called to update triage. Pt said he has not had any change in meds and no new medical problems since he was scheduled.  Routing to Laban Emperor, NP to sign off since Neil Crouch, [place] is on vacation.

## 2014-02-04 NOTE — Telephone Encounter (Signed)
Pt has his instructions.

## 2014-02-06 ENCOUNTER — Telehealth: Payer: Self-pay

## 2014-02-06 NOTE — Telephone Encounter (Signed)
Pt called to cancel his TCS on 02/11/14. He just does not want to have it done. Left Kim a message.

## 2014-02-07 NOTE — Telephone Encounter (Signed)
noted 

## 2014-02-09 NOTE — Telephone Encounter (Signed)
noted 

## 2014-02-11 ENCOUNTER — Other Ambulatory Visit: Payer: Self-pay

## 2014-02-11 ENCOUNTER — Ambulatory Visit (HOSPITAL_COMMUNITY): Admission: RE | Admit: 2014-02-11 | Payer: Non-veteran care | Source: Ambulatory Visit | Admitting: Internal Medicine

## 2014-02-11 ENCOUNTER — Encounter (HOSPITAL_COMMUNITY): Admission: RE | Payer: Self-pay | Source: Ambulatory Visit

## 2014-02-11 DIAGNOSIS — Z1211 Encounter for screening for malignant neoplasm of colon: Secondary | ICD-10-CM

## 2014-02-11 SURGERY — COLONOSCOPY
Anesthesia: Moderate Sedation

## 2014-02-11 NOTE — Telephone Encounter (Signed)
Noted  

## 2014-02-11 NOTE — Telephone Encounter (Signed)
Pt called back to reschedule his appt for his colonoscopy. Previously scheduled for today, but he had called and cancelled.  He called today and said he now has someone who can go with him to the hospital for the procedure. He has been rescheduled for 03/04/2014 at 7:30 AM with Dr. Gala Romney. He is aware to be at the hospital at 6:30 AM.  He has his prep and had misplaced his instructions so I am mailing him new instructions.  He said he has not had any change in his meds since he was triaged.

## 2014-02-17 ENCOUNTER — Encounter (HOSPITAL_COMMUNITY): Payer: Self-pay | Admitting: Pharmacy Technician

## 2014-03-04 ENCOUNTER — Ambulatory Visit (HOSPITAL_COMMUNITY)
Admission: RE | Admit: 2014-03-04 | Discharge: 2014-03-04 | Disposition: A | Discharge status: Home / Self Care | Payer: Non-veteran care | Source: Ambulatory Visit | Attending: Internal Medicine | Admitting: Internal Medicine

## 2014-03-04 ENCOUNTER — Encounter (HOSPITAL_COMMUNITY)
Admission: RE | Disposition: A | Discharge status: Home / Self Care | Payer: Self-pay | Source: Ambulatory Visit | Attending: Internal Medicine

## 2014-03-04 ENCOUNTER — Encounter (HOSPITAL_COMMUNITY): Payer: Self-pay | Admitting: *Deleted

## 2014-03-04 DIAGNOSIS — Z860100 Personal history of colon polyps, unspecified: Secondary | ICD-10-CM

## 2014-03-04 DIAGNOSIS — E7800 Pure hypercholesterolemia, unspecified: Secondary | ICD-10-CM | POA: Insufficient documentation

## 2014-03-04 DIAGNOSIS — Z96619 Presence of unspecified artificial shoulder joint: Secondary | ICD-10-CM | POA: Diagnosis not present

## 2014-03-04 DIAGNOSIS — Z96659 Presence of unspecified artificial knee joint: Secondary | ICD-10-CM | POA: Insufficient documentation

## 2014-03-04 DIAGNOSIS — E119 Type 2 diabetes mellitus without complications: Secondary | ICD-10-CM | POA: Diagnosis not present

## 2014-03-04 DIAGNOSIS — D126 Benign neoplasm of colon, unspecified: Secondary | ICD-10-CM

## 2014-03-04 DIAGNOSIS — Z1211 Encounter for screening for malignant neoplasm of colon: Secondary | ICD-10-CM

## 2014-03-04 DIAGNOSIS — K5730 Diverticulosis of large intestine without perforation or abscess without bleeding: Secondary | ICD-10-CM

## 2014-03-04 DIAGNOSIS — I10 Essential (primary) hypertension: Secondary | ICD-10-CM | POA: Insufficient documentation

## 2014-03-04 DIAGNOSIS — Z09 Encounter for follow-up examination after completed treatment for conditions other than malignant neoplasm: Secondary | ICD-10-CM | POA: Diagnosis present

## 2014-03-04 HISTORY — PX: COLONOSCOPY: SHX5424

## 2014-03-04 LAB — GLUCOSE, CAPILLARY: Glucose-Capillary: 127 mg/dL — ABNORMAL HIGH (ref 70–99)

## 2014-03-04 SURGERY — COLONOSCOPY
Anesthesia: Moderate Sedation

## 2014-03-04 MED ORDER — MIDAZOLAM HCL 5 MG/5ML IJ SOLN
INTRAMUSCULAR | Status: DC
Start: 2014-03-04 — End: 2014-03-04
  Filled 2014-03-04: qty 10

## 2014-03-04 MED ORDER — MEPERIDINE HCL 100 MG/ML IJ SOLN
INTRAMUSCULAR | Status: DC | PRN
  Administered 2014-03-04: 50 mg via INTRAVENOUS
  Administered 2014-03-04: 25 mg via INTRAVENOUS

## 2014-03-04 MED ORDER — MIDAZOLAM HCL 5 MG/5ML IJ SOLN
INTRAMUSCULAR | Status: DC | PRN
  Administered 2014-03-04: 2 mg via INTRAVENOUS
  Administered 2014-03-04: 1 mg via INTRAVENOUS

## 2014-03-04 MED ORDER — SODIUM CHLORIDE 0.9 % IV SOLN
INTRAVENOUS | Status: DC
  Administered 2014-03-04: 1000 mL via INTRAVENOUS

## 2014-03-04 MED ORDER — ONDANSETRON HCL 4 MG/2ML IJ SOLN
INTRAMUSCULAR | Status: DC
Start: 2014-03-04 — End: 2014-03-04
  Filled 2014-03-04: qty 2

## 2014-03-04 MED ORDER — ONDANSETRON HCL 4 MG/2ML IJ SOLN
INTRAMUSCULAR | Status: DC | PRN
  Administered 2014-03-04: 4 mg via INTRAVENOUS

## 2014-03-04 MED ORDER — STERILE WATER FOR IRRIGATION IR SOLN
Status: DC | PRN
  Administered 2014-03-04: 08:00:00

## 2014-03-04 MED ORDER — MEPERIDINE HCL 100 MG/ML IJ SOLN
INTRAMUSCULAR | Status: AC
  Filled 2014-03-04: qty 2

## 2014-03-04 NOTE — Op Note (Signed)
[facility] [address] [place], [address]   COLONOSCOPY PROCEDURE REPORT  PATIENT: Timothy Guerrero, Timothy Guerrero.  MR#:         [id number] BIRTHDATE: 10-24-1940 , 72  yrs. old GENDER: Male ENDOSCOPIST: R.  Garfield Cornea, MD FACP FACG REFERRED BY:  Redge Gainer, M.D. PROCEDURE DATE:  03/04/2014 PROCEDURE:     Ileocolonoscopy with biopsy  INDICATIONS: History of high-grade colonic adenoma  INFORMED CONSENT:  The risks, benefits, alternatives and imponderables including but not limited to bleeding, perforation as well as the possibility of a missed lesion have been reviewed.  The potential for biopsy, lesion removal, etc. have also been discussed.  Questions have been answered.  All parties agreeable. Please see the history and physical in the medical record for more information.  MEDICATIONS: Versed 3 mg IV and Demerol 75 mg IV in divided doses. Zofran 4 mg IV  DESCRIPTION OF PROCEDURE:  After a digital rectal exam was performed, the EC-[id number] ([id number])  colonoscope was advanced from the anus through the rectum and colon to the area of the cecum, ileocecal valve and appendiceal orifice.  The cecum was deeply intubated.  These structures were well-seen and photographed for the record.  From the level of the cecum and ileocecal valve, the scope was slowly and cautiously withdrawn.  The mucosal surfaces were carefully surveyed utilizing scope tip deflection to facilitate fold flattening as needed.  The scope was pulled down into the rectum where a thorough examination including retroflexion was performed.    FINDINGS:  Adequate preparation. Normal rectum. Scattered left-sided diverticula; (2) diminutive polyps in mid descending segment; otherwise, the remainder of the colonic mucosa appeared normal. The distal 5 cm of terminal ileal mucosa also appeared normal.  THERAPEUTIC / DIAGNOSTIC MANEUVERS PERFORMED:  The above-mentioned polyps were cold  biopsied/removed  COMPLICATIONS: none  CECAL WITHDRAWAL TIME:  12 minutes  IMPRESSION:  Colonic polyps-removed as described above. Colonic diverticulosis  RECOMMENDATIONS: Followup on pathology.   _______________________________ eSigned:  R. Garfield Cornea, MD FACP [facility] 03/04/2014 8:13 AM   CC:    PATIENT NAME:  Tahje, Borawski. MR#: [id number]

## 2014-03-04 NOTE — Discharge Instructions (Addendum)
Colonoscopy Discharge Instructions  Read the instructions outlined below and refer to this sheet in the next few weeks. These discharge instructions provide you with general information on caring for yourself after you leave the hospital. Your doctor may also give you specific instructions. While your treatment has been planned according to the most current medical practices available, unavoidable complications occasionally occur. If you have any problems or questions after discharge, call Dr. Gala Romney at [phone]. ACTIVITY  You may resume your regular activity, but move at a slower pace for the next 24 hours.   Take frequent rest periods for the next 24 hours.   Walking will help get rid of the air and reduce the bloated feeling in your belly (abdomen).   No driving for 24 hours (because of the medicine (anesthesia) used during the test).    Do not sign any important legal documents or operate any machinery for 24 hours (because of the anesthesia used during the test).  NUTRITION  Drink plenty of fluids.   You may resume your normal diet as instructed by your doctor.   Begin with a light meal and progress to your normal diet. Heavy or fried foods are harder to digest and may make you feel sick to your stomach (nauseated).   Avoid alcoholic beverages for 24 hours or as instructed.  MEDICATIONS  You may resume your normal medications unless your doctor tells you otherwise.  WHAT YOU CAN EXPECT TODAY  Some feelings of bloating in the abdomen.   Passage of more gas than usual.   Spotting of blood in your stool or on the toilet paper.  IF YOU HAD POLYPS REMOVED DURING THE COLONOSCOPY:  No aspirin products for 7 days or as instructed.   No alcohol for 7 days or as instructed.   Eat a soft diet for the next 24 hours.  FINDING OUT THE RESULTS OF YOUR TEST Not all test results are available during your visit. If your test results are not back during the visit, make an appointment  with your caregiver to find out the results. Do not assume everything is normal if you have not heard from your caregiver or the medical facility. It is important for you to follow up on all of your test results.  SEEK IMMEDIATE MEDICAL ATTENTION IF:  You have more than a spotting of blood in your stool.   Your belly is swollen (abdominal distention).   You are nauseated or vomiting.   You have a temperature over 101.   You have abdominal pain or discomfort that is severe or gets worse throughout the day.    Polyp and diverticulosis information provided  Further recommendations to follow pending review of pathology report  Your blood pressure was elevated today. I recommend you have it rechecked by your primary care doctor later in the week  Diverticulosis Diverticulosis is the condition that develops when small pouches (diverticula) form in the wall of your colon. Your colon, or large intestine, is where water is absorbed and stool is formed. The pouches form when the inside layer of your colon pushes through weak spots in the outer layers of your colon. CAUSES  No one knows exactly what causes diverticulosis. RISK FACTORS Being older than 85. Your risk for this condition increases with age. Diverticulosis is rare in people younger than 40 years. By age 74, almost everyone has it. Eating a low-fiber diet. Being frequently constipated. Being overweight. Not getting enough exercise. Smoking. Taking over-the-counter pain medicines, like aspirin  and ibuprofen. SYMPTOMS  Most people with diverticulosis do not have symptoms. DIAGNOSIS  Because diverticulosis often has no symptoms, health care providers often discover the condition during an exam for other colon problems. In many cases, a health care provider will diagnose diverticulosis while using a flexible scope to examine the colon (colonoscopy). TREATMENT  If you have never developed an infection related to diverticulosis, you  may not need treatment. If you have had an infection before, treatment may include: Eating more fruits, vegetables, and grains. Taking a fiber supplement. Taking a live bacteria supplement (probiotic). Taking medicine to relax your colon. HOME CARE INSTRUCTIONS  Drink at least 6-8 glasses of water each day to prevent constipation. Try not to strain when you have a bowel movement. Keep all follow-up appointments. If you have had an infection before: Increase the fiber in your diet as directed by your health care provider or dietitian. Take a dietary fiber supplement if your health care provider approves. Only take medicines as directed by your health care provider. SEEK MEDICAL CARE IF:  You have abdominal pain. You have bloating. You have cramps. You have not gone to the bathroom in 3 days. SEEK IMMEDIATE MEDICAL CARE IF:  Your pain gets worse. Yourbloating becomes very bad. You have a fever or chills, and your symptoms suddenly get worse. You begin vomiting. You have bowel movements that are bloody or black. MAKE SURE YOU: Understand these instructions. Will watch your condition. Will get help right away if you are not doing well or get worse. Document Released: 03/16/2004 Document Revised: 06/24/2013 Document Reviewed: 05/14/2013 [facility] Patient Information 2015 [place], [place]. This information is not intended to replace advice given to you by your health care provider. Make sure you discuss any questions you have with your health care provider. Colon Polyps Polyps are lumps of extra tissue growing inside the body. Polyps can grow in the large intestine (colon). Most colon polyps are noncancerous (benign). However, some colon polyps can become cancerous over time. Polyps that are larger than a pea may be harmful. To be safe, caregivers remove and test all polyps. CAUSES  Polyps form when mutations in the genes cause your cells to grow and divide even though no more tissue is  needed. RISK FACTORS There are a number of risk factors that can increase your chances of getting colon polyps. They include: Being older than 50 years. Family history of colon polyps or colon cancer. Long-term colon diseases, such as colitis or Crohn disease. Being overweight. Smoking. Being inactive. Drinking too much alcohol. SYMPTOMS  Most small polyps do not cause symptoms. If symptoms are present, they may include: Blood in the stool. The stool may look dark red or black. Constipation or diarrhea that lasts longer than 1 week. DIAGNOSIS People often do not know they have polyps until their caregiver finds them during a regular checkup. Your caregiver can use 4 tests to check for polyps: Digital rectal exam. The caregiver wears gloves and feels inside the rectum. This test would find polyps only in the rectum. Barium enema. The caregiver puts a liquid called barium into your rectum before taking X-rays of your colon. Barium makes your colon look white. Polyps are dark, so they are easy to see in the X-ray pictures. Sigmoidoscopy. A thin, flexible tube (sigmoidoscope) is placed into your rectum. The sigmoidoscope has a light and tiny camera in it. The caregiver uses the sigmoidoscope to look at the last third of your colon. Colonoscopy. This test is like  sigmoidoscopy, but the caregiver looks at the entire colon. This is the most common method for finding and removing polyps. TREATMENT  Any polyps will be removed during a sigmoidoscopy or colonoscopy. The polyps are then tested for cancer. PREVENTION  To help lower your risk of getting more colon polyps: Eat plenty of fruits and vegetables. Avoid eating fatty foods. Do not smoke. Avoid drinking alcohol. Exercise every day. Lose weight if recommended by your caregiver. Eat plenty of calcium and folate. Foods that are rich in calcium include milk, cheese, and broccoli. Foods that are rich in folate include chickpeas, kidney beans, and  spinach. HOME CARE INSTRUCTIONS Keep all follow-up appointments as directed by your caregiver. You may need periodic exams to check for polyps. SEEK MEDICAL CARE IF: You notice bleeding during a bowel movement. Document Released: 03/15/2004 Document Revised: 09/11/2011 Document Reviewed: 08/29/2011 [facility] Patient Information 2015 [place], [place]. This information is not intended to replace advice given to you by your health care provider. Make sure you discuss any questions you have with your health care provider.

## 2014-03-04 NOTE — H&P (Signed)
@LOGO @   Primary Care Physician:  Redge Gainer, MD Primary Gastroenterologist:  Dr. Gala Romney  Pre-Procedure History & Physical: HPI:  Timothy Guerrero is a 73 y.o. male here for surveillance colonoscopy. History of high-grade adenoma removed elsewhere 2007 reported negative colonoscopy 2010. No bowel symptoms currently.  Past Medical History  Diagnosis Date  . Diabetes mellitus without complication   . Hypertension   . Hypercholesteremia   . Colon polyp     2007, polyp with early cancer, one year f/u no residual polyp. next TCS 2010, see PSH. [facility].    Past Surgical History  Procedure Laterality Date  . Prostate surgery    . Knee replacements      bilateral. over 10 years ago  . Neck surgery    . Total hip arthroplasty  2007    left  . Total shoulder replacement  2011    left  . Colonoscopy  08/2008    Dr. Leafy Half, [place]: normal, internal grade 1 hemorrhoids. next TCS 08/2013    Prior to Admission medications   Medication Sig Start Date End Date Taking? Authorizing Provider  glipiZIDE (GLUCOTROL) 10 MG tablet Take 10 mg by mouth daily. 05/01/13  Yes Historical Provider, MD  ibuprofen (ADVIL,MOTRIN) 400 MG tablet Take 400 mg by mouth 2 (two) times daily.  05/01/13  Yes Historical Provider, MD  aspirin 325 MG tablet Take 325 mg by mouth daily.    Historical Provider, MD  atenolol (TENORMIN) 100 MG tablet Take 100 mg by mouth daily. 05/01/13   Historical Provider, MD  cloNIDine (CATAPRES) 0.1 MG tablet Take 0.1 mg by mouth 2 (two) times daily.    Historical Provider, MD  hydrochlorothiazide (HYDRODIURIL) 25 MG tablet Take 25 mg by mouth daily. 05/01/13   Historical Provider, MD  lisinopril (PRINIVIL,ZESTRIL) 40 MG tablet Take 40 mg by mouth daily. 05/01/13   Historical Provider, MD  metFORMIN (GLUCOPHAGE) 1000 MG tablet Take 1,000 mg by mouth 2 (two) times daily. 05/01/13   Historical Provider, MD  omeprazole (PRILOSEC) 20 MG capsule  Take 20 mg by mouth 2 (two) times daily as needed. For acid reflux 03/17/13   Historical Provider, MD  simvastatin (ZOCOR) 40 MG tablet Take 40 mg by mouth at bedtime. 05/01/13   Historical Provider, MD  vitamin B-12 (CYANOCOBALAMIN) 100 MCG tablet Take 100 mcg by mouth daily.    Historical Provider, MD    Allergies as of 02/11/2014  . (No Known Allergies)    Family History  Problem Relation Age of Onset  . Colon cancer Neg Hx     History   Social History  . Marital Status: Divorced    Spouse Name: N/A    Number of Children: N/A  . Years of Education: N/A   Occupational History  . Not on file.   Social History Main Topics  . Smoking status: Never Smoker   . Smokeless tobacco: Not on file  . Alcohol Use: No  . Drug Use: No  . Sexual Activity: Not on file   Other Topics Concern  . Not on file   Social History Narrative   [employer].    Review of Systems: See HPI, otherwise negative ROS  Physical Exam: BP 201/82  Pulse 94  Temp(Src) 98.1 F (36.7 C) (Oral)  Resp 17  SpO2 97% General:   Alert,  Well-developed, well-nourished, pleasant and cooperative in NAD Skin:  Intact without significant lesions or rashes. Eyes:  Sclera clear,  no icterus.   Conjunctiva pink. Ears:  Normal auditory acuity. Nose:  No deformity, discharge,  or lesions. Mouth:  No deformity or lesions. Neck:  Supple; no masses or thyromegaly. No significant cervical adenopathy. Lungs:  Clear throughout to auscultation.   No wheezes, crackles, or rhonchi. No acute distress. Heart:  Regular rate and rhythm; no murmurs, clicks, rubs,  or gallops. Abdomen: Non-distended, normal bowel sounds.  Soft and nontender without appreciable mass or hepatosplenomegaly.  Pulses:  Normal pulses noted. Extremities:  Without clubbing or edema.  Impression:   73 year old male with a history of high-grade adenomas due for surveillance colonoscopy this time.  The risks, benefits, limitations, alternatives and  imponderables have been reviewed with the patient. Questions have been answered. All parties are agreeable.        Notice: This dictation was prepared with Dragon dictation along with smaller phrase technology. Any transcriptional errors that result from this process are unintentional and may not be corrected upon review.

## 2014-03-07 ENCOUNTER — Encounter: Payer: Self-pay | Admitting: Internal Medicine

## 2014-03-10 ENCOUNTER — Encounter (HOSPITAL_COMMUNITY): Payer: Self-pay | Admitting: Internal Medicine

## 2014-07-06 ENCOUNTER — Ambulatory Visit (INDEPENDENT_AMBULATORY_CARE_PROVIDER_SITE_OTHER): Payer: Commercial Managed Care - HMO

## 2014-07-06 ENCOUNTER — Ambulatory Visit (INDEPENDENT_AMBULATORY_CARE_PROVIDER_SITE_OTHER): Payer: Commercial Managed Care - HMO | Admitting: Family Medicine

## 2014-07-06 ENCOUNTER — Encounter: Payer: Self-pay | Admitting: Family Medicine

## 2014-07-06 VITALS — BP 162/86 | HR 82 | Temp 97.9°F | Ht 72.0 in | Wt 219.2 lb

## 2014-07-06 DIAGNOSIS — M79601 Pain in right arm: Secondary | ICD-10-CM

## 2014-07-06 DIAGNOSIS — M79604 Pain in right leg: Secondary | ICD-10-CM

## 2014-07-06 DIAGNOSIS — S8991XA Unspecified injury of right lower leg, initial encounter: Secondary | ICD-10-CM

## 2014-07-06 DIAGNOSIS — Z96651 Presence of right artificial knee joint: Secondary | ICD-10-CM

## 2014-07-06 DIAGNOSIS — S79821A Other specified injuries of right thigh, initial encounter: Secondary | ICD-10-CM

## 2014-07-06 DIAGNOSIS — D509 Iron deficiency anemia, unspecified: Secondary | ICD-10-CM

## 2014-07-06 DIAGNOSIS — R58 Hemorrhage, not elsewhere classified: Secondary | ICD-10-CM

## 2014-07-06 DIAGNOSIS — S61411A Laceration without foreign body of right hand, initial encounter: Secondary | ICD-10-CM | POA: Diagnosis not present

## 2014-07-06 DIAGNOSIS — R269 Unspecified abnormalities of gait and mobility: Secondary | ICD-10-CM | POA: Diagnosis not present

## 2014-07-06 LAB — POCT CBC
Granulocyte percent: 77.6 %G (ref 37–80)
HCT, POC: 40.8 % — AB (ref 43.5–53.7)
Hemoglobin: 12.2 g/dL — AB (ref 14.1–18.1)
Lymph, poc: 1.6 (ref 0.6–3.4)
MCH, POC: 25.8 pg — AB (ref 27–31.2)
MCHC: 29.8 g/dL — AB (ref 31.8–35.4)
MCV: 86.4 fL (ref 80–97)
MPV: 8.4 fL (ref 0–99.8)
POC Granulocyte: 6.3 (ref 2–6.9)
POC LYMPH PERCENT: 19.7 %L (ref 10–50)
Platelet Count, POC: 255 10*3/uL (ref 142–424)
RBC: 4.7 M/uL (ref 4.69–6.13)
RDW, POC: 18.4 %
WBC: 8.1 10*3/uL (ref 4.6–10.2)

## 2014-07-06 MED ORDER — IRON 325 (65 FE) MG PO TABS: 325.0000 mg | ORAL_TABLET | ORAL | Status: DC
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
## 2014-07-14 ENCOUNTER — Ambulatory Visit: Payer: Non-veteran care | Admitting: Physical Therapy

## 2014-08-12 ENCOUNTER — Other Ambulatory Visit: Payer: Self-pay | Admitting: Dermatology

## 2014-08-12 DIAGNOSIS — C4359 Malignant melanoma of other part of trunk: Secondary | ICD-10-CM | POA: Diagnosis not present

## 2014-08-12 DIAGNOSIS — Z85820 Personal history of malignant melanoma of skin: Secondary | ICD-10-CM | POA: Diagnosis not present

## 2014-08-12 DIAGNOSIS — D1801 Hemangioma of skin and subcutaneous tissue: Secondary | ICD-10-CM | POA: Diagnosis not present

## 2014-08-12 DIAGNOSIS — L570 Actinic keratosis: Secondary | ICD-10-CM | POA: Diagnosis not present

## 2014-08-12 DIAGNOSIS — Z85828 Personal history of other malignant neoplasm of skin: Secondary | ICD-10-CM | POA: Diagnosis not present

## 2014-08-12 DIAGNOSIS — L821 Other seborrheic keratosis: Secondary | ICD-10-CM | POA: Diagnosis not present

## 2014-08-31 ENCOUNTER — Other Ambulatory Visit: Payer: Self-pay | Admitting: Dermatology

## 2014-08-31 DIAGNOSIS — L905 Scar conditions and fibrosis of skin: Secondary | ICD-10-CM | POA: Diagnosis not present

## 2014-08-31 DIAGNOSIS — Z85828 Personal history of other malignant neoplasm of skin: Secondary | ICD-10-CM | POA: Diagnosis not present

## 2014-08-31 DIAGNOSIS — C4359 Malignant melanoma of other part of trunk: Secondary | ICD-10-CM | POA: Diagnosis not present

## 2014-08-31 DIAGNOSIS — Z85820 Personal history of malignant melanoma of skin: Secondary | ICD-10-CM | POA: Diagnosis not present

## 2014-11-24 ENCOUNTER — Telehealth: Payer: Self-pay | Admitting: Family Medicine

## 2014-11-24 DIAGNOSIS — C7801 Secondary malignant neoplasm of right lung: Secondary | ICD-10-CM

## 2014-11-24 NOTE — Telephone Encounter (Signed)
Referral has been placed. Detailed message left for patient.

## 2014-11-24 NOTE — Telephone Encounter (Signed)
The surgeon already has his records they just need referral for right lower lobe carcinoma.

## 2014-11-24 NOTE — Telephone Encounter (Signed)
Please do a referral for this patient

## 2014-12-08 ENCOUNTER — Other Ambulatory Visit: Payer: Self-pay | Admitting: *Deleted

## 2014-12-08 ENCOUNTER — Institutional Professional Consult (permissible substitution) (INDEPENDENT_AMBULATORY_CARE_PROVIDER_SITE_OTHER): Payer: Commercial Managed Care - HMO | Admitting: Thoracic Surgery (Cardiothoracic Vascular Surgery)

## 2014-12-08 ENCOUNTER — Encounter: Payer: Self-pay | Admitting: Thoracic Surgery (Cardiothoracic Vascular Surgery)

## 2014-12-08 DIAGNOSIS — R918 Other nonspecific abnormal finding of lung field: Secondary | ICD-10-CM | POA: Diagnosis not present

## 2014-12-08 DIAGNOSIS — I10 Essential (primary) hypertension: Secondary | ICD-10-CM

## 2014-12-08 DIAGNOSIS — E119 Type 2 diabetes mellitus without complications: Secondary | ICD-10-CM | POA: Diagnosis not present

## 2014-12-08 DIAGNOSIS — C61 Malignant neoplasm of prostate: Secondary | ICD-10-CM

## 2014-12-08 DIAGNOSIS — Z8546 Personal history of malignant neoplasm of prostate: Secondary | ICD-10-CM | POA: Insufficient documentation

## 2014-12-08 NOTE — Progress Notes (Signed)
PCP is [place], Orson Ape, FNP Referring Provider is Lysbeth Penner, FNP  Chief Complaint  Patient presents with  . Lung Cancer    Surgical eval, PET Scan, Chest CT 09/17/14, CT guided neddle BX 11/06/14    HPI: [name] is a 74 year old gentleman with a remote history of tobacco abuse and history of multiple melanomas who to have a right lower lobe mass on CT scan done at the [place] in [place].   He recently was found to have a 3.5 cm spiculated mass in the right lower lobe on a CT scan that was not present 2 years prior. A PET CT was done and the mass was hypermetabolic. There also was a relatively normal-appearing right paratracheal node that was mildly hypermetabolic as well. He had a bronchoscopic biopsy which was unrevealing and then underwent a CT-guided biopsy on May 6. He never received result from that. We subsequently received word from [facility] that it showed atypical cells.  He has had some issues with chest pain dating back for a couple of years. This is mostly right side and sometimes exertional. He's been worked up with multiple cardiac studies and pulmonary function testing, which have failed to show any underlying cause. Of note his is recent Cardiolite was in September and was low risk for ischemia. He says the pain has been worse since the needle biopsy was done. There is a positional component is worse when he lies on his right side.  He has had no change in appetite or weight loss. He does complain of decreased energy. Has arthritis and has had knee, hip, and shoulder surgeries. He had prostate cancer in 2001 and had a resection. He has had 3 melanomas resected most recently was on his back. He has type 2 diabetes and is on Lantus 60 units daily in addition to Glucotrol and metformin.   Zubrod Score: At the time of surgery this patient's most appropriate activity status/level should be described as: '[]'$     0    Normal activity, no symptoms '[x]'$     1    Restricted in  physical strenuous activity but ambulatory, able to do out light work '[]'$     2    Ambulatory and capable of self care, unable to do work activities, up and about >50 % of waking hours                              '[]'$     3    Only limited self care, in bed greater than 50% of waking hours '[]'$     4    Completely disabled, no self care, confined to bed or chair '[]'$     5    Moribund  Past Medical History  Diagnosis Date  . Diabetes mellitus without complication   . Hypertension   . Hypercholesteremia   . Colon polyp     2007, polyp with early cancer, one year f/u no residual polyp. next TCS 2010, see PSH. [facility].  . Skin melanoma     Past Surgical History  Procedure Laterality Date  . Prostate surgery    . Knee replacements      bilateral. over 10 years ago  . Neck surgery    . Total hip arthroplasty  2007    left  . Total shoulder replacement  2011    left  . Colonoscopy  08/2008    Dr.  Leafy Half, [place]: normal, internal grade 1 hemorrhoids. next TCS 08/2013  . Colonoscopy N/A 03/04/2014    Procedure: COLONOSCOPY;  Surgeon: Daneil Dolin, MD;  Location: [facility];  Service: Endoscopy;  Laterality: N/A;  7:30am    Family History  Problem Relation Age of Onset  . Colon cancer Neg Hx     Social History History  Substance Use Topics  . Smoking status: Former Smoker -- 2.00 packs/day    Types: Cigarettes    Quit date: 07/03/1986  . Smokeless tobacco: Not on file  . Alcohol Use: No    Current Outpatient Prescriptions  Medication Sig Dispense Refill  . aspirin 325 MG tablet Take 325 mg by mouth daily.    . cloNIDine (CATAPRES) 0.2 MG tablet Take 0.2 mg by mouth daily.     [name] glipiZIDE (GLUCOTROL) 10 MG tablet Take 10 mg by mouth daily.    . hydrochlorothiazide (HYDRODIURIL) 25 MG tablet Take 25 mg by mouth daily.    [name] ibuprofen (ADVIL,MOTRIN) 400 MG tablet Take 400 mg by mouth 2 (two) times daily.     . insulin glargine (LANTUS) 100  UNIT/ML injection Inject 60 Units into the skin daily.    [name] lisinopril (PRINIVIL,ZESTRIL) 40 MG tablet Take 40 mg by mouth daily.    . metFORMIN (GLUCOPHAGE) 1000 MG tablet Take 1,000 mg by mouth 2 (two) times daily.    . metoprolol succinate (TOPROL-XL) 100 MG 24 hr tablet Take 100 mg by mouth daily.      No current facility-administered medications for this visit.    No Known Allergies  Review of Systems  Constitutional: Positive for fatigue. Negative for fever, chills, activity change, appetite change and unexpected weight change.  Eyes: Positive for redness (right eye started a week ago). Negative for discharge and visual disturbance.  Respiratory: Positive for cough (non productive) and shortness of breath (with heavy activity).   Cardiovascular: Positive for chest pain (right sided, worse since needle biopsy, CP with exertion with multiple prior negative cardiolites).  Gastrointestinal: Negative.   Endocrine: Negative for cold intolerance and heat intolerance.  Genitourinary: Negative for dysuria, urgency, frequency and difficulty urinating.       Prostatectomy in 2001  Musculoskeletal: Positive for arthralgias.  Neurological: Positive for dizziness. Negative for syncope, numbness and headaches.  Hematological: Negative for adenopathy. Bruises/bleeds easily.  All other systems reviewed and are negative.   BP 130/74 mmHg  Pulse 86  Resp 20  Ht 6' (1.829 m)  Wt 210 lb (95.255 kg)  BMI 28.47 kg/m2  SpO2 97% Physical Exam  Constitutional: He is oriented to person, place, and time. He appears well-developed and well-nourished. No distress.  HENT:  Head: Normocephalic and atraumatic.  Eyes: EOM are normal. Pupils are equal, round, and reactive to light.  Right Sclera red, no exudate  Neck: Neck supple. No thyromegaly present.  Cardiovascular: Normal rate, regular rhythm, normal heart sounds and intact distal pulses.  Exam reveals no gallop and no friction rub.   No murmur  heard. Pulmonary/Chest: Effort normal and breath sounds normal. He has no wheezes. He has no rales.  Abdominal: Soft. There is no tenderness.  Musculoskeletal: He exhibits no edema.  Lymphadenopathy:    He has no cervical adenopathy.  Neurological: He is alert and oriented to person, place, and time. No cranial nerve deficit.  No motor deficit  Skin: Skin is warm and dry.  Psychiatric: He has a normal mood and affect.  Vitals  reviewed.    Diagnostic Tests: I personally reviewed the images and reports of the CT and PET/CT from [facility].  CT showed a spiculated 3.5- 4 cm right lower lobe mass abutting the major fissure. There was no mediastinal or hilar adenopathy.  PET/CT showed the right lower lobe mass was hyper metabolic with an SUV of 45.8. 1 cm right paratracheal node with a fatty hilum and SUV max of 3.8. No other suspicious findings  Pulmonary function testing showed normal FVC and FEV1 and a DLCO of 60%  Impression: 74 year old man with a remote history of tobacco abuse as well as a history of melanoma and prostate cancer who presents with a spiculated right lower lobe nodule abutting the major fissure. It is markedly hypermetabolic on PET CT with an SUV of 11.5. Biopsies done at the [facility] showed atypical cells, but were nondiagnostic.  I discussed our options. We could try to repeat biopsies. However given the previously unsuccessful biopsy attempts in the highly suspicious nature of the lesion I think it is best to go ahead with surgical resection. This will give [place] a definitive diagnosis as well as potentially definitively treat the lesion.  I recommended that we proceed with a right VATS, wedge resection, possible right lower lobectomy depending on frozen section findings. I described the general nature of the operation, including the need for general anesthesia, the incisions to be used, postoperative chest tube drainage, expected hospital stay, and overall  recovery. I reviewed the indications, risks, benefits, and alternatives. He understands the risks include, but are not limited to death, MI, stroke, DVT, PE, bleeding, possible need for transfusion, infection, prolonged air leak, cardiac arrhythmias, as well as the possibility of other unforeseeable complications. He accepts the risks and wishes to proceed.  Plan: Right VATS, wedge resection, possible right lower lobectomy on Thursday, 12/17/2014.  Melrose Nakayama, MD [facility] [phone]

## 2014-12-15 ENCOUNTER — Encounter (HOSPITAL_COMMUNITY)
Admission: RE | Admit: 2014-12-15 | Discharge: 2014-12-15 | Disposition: A | Discharge status: Home / Self Care | Payer: Commercial Managed Care - HMO | Source: Ambulatory Visit | Attending: Thoracic Surgery (Cardiothoracic Vascular Surgery) | Admitting: Thoracic Surgery (Cardiothoracic Vascular Surgery)

## 2014-12-15 ENCOUNTER — Encounter (HOSPITAL_COMMUNITY): Payer: Self-pay

## 2014-12-15 ENCOUNTER — Other Ambulatory Visit: Payer: Self-pay

## 2014-12-15 VITALS — BP 126/60 | HR 77 | Temp 98.3°F | Resp 20 | Ht 72.0 in | Wt 216.5 lb

## 2014-12-15 DIAGNOSIS — I471 Supraventricular tachycardia: Secondary | ICD-10-CM | POA: Diagnosis not present

## 2014-12-15 DIAGNOSIS — I2510 Atherosclerotic heart disease of native coronary artery without angina pectoris: Secondary | ICD-10-CM | POA: Diagnosis not present

## 2014-12-15 DIAGNOSIS — R222 Localized swelling, mass and lump, trunk: Secondary | ICD-10-CM | POA: Diagnosis not present

## 2014-12-15 DIAGNOSIS — Z8546 Personal history of malignant neoplasm of prostate: Secondary | ICD-10-CM | POA: Diagnosis not present

## 2014-12-15 DIAGNOSIS — E119 Type 2 diabetes mellitus without complications: Secondary | ICD-10-CM | POA: Diagnosis not present

## 2014-12-15 DIAGNOSIS — Z85820 Personal history of malignant melanoma of skin: Secondary | ICD-10-CM | POA: Diagnosis not present

## 2014-12-15 DIAGNOSIS — Z01818 Encounter for other preprocedural examination: Secondary | ICD-10-CM | POA: Diagnosis not present

## 2014-12-15 DIAGNOSIS — Z96653 Presence of artificial knee joint, bilateral: Secondary | ICD-10-CM | POA: Diagnosis present

## 2014-12-15 DIAGNOSIS — Z96642 Presence of left artificial hip joint: Secondary | ICD-10-CM | POA: Diagnosis present

## 2014-12-15 DIAGNOSIS — Z01812 Encounter for preprocedural laboratory examination: Secondary | ICD-10-CM | POA: Diagnosis not present

## 2014-12-15 DIAGNOSIS — R0602 Shortness of breath: Secondary | ICD-10-CM | POA: Diagnosis not present

## 2014-12-15 DIAGNOSIS — Z7982 Long term (current) use of aspirin: Secondary | ICD-10-CM | POA: Diagnosis not present

## 2014-12-15 DIAGNOSIS — Z96612 Presence of left artificial shoulder joint: Secondary | ICD-10-CM | POA: Diagnosis present

## 2014-12-15 DIAGNOSIS — D0221 Carcinoma in situ of right bronchus and lung: Secondary | ICD-10-CM | POA: Diagnosis not present

## 2014-12-15 DIAGNOSIS — Z87891 Personal history of nicotine dependence: Secondary | ICD-10-CM | POA: Diagnosis not present

## 2014-12-15 DIAGNOSIS — J9811 Atelectasis: Secondary | ICD-10-CM | POA: Diagnosis not present

## 2014-12-15 DIAGNOSIS — I483 Typical atrial flutter: Secondary | ICD-10-CM | POA: Diagnosis not present

## 2014-12-15 DIAGNOSIS — Z01810 Encounter for preprocedural cardiovascular examination: Secondary | ICD-10-CM | POA: Diagnosis not present

## 2014-12-15 DIAGNOSIS — I4892 Unspecified atrial flutter: Secondary | ICD-10-CM | POA: Diagnosis not present

## 2014-12-15 DIAGNOSIS — R918 Other nonspecific abnormal finding of lung field: Secondary | ICD-10-CM

## 2014-12-15 DIAGNOSIS — K567 Ileus, unspecified: Secondary | ICD-10-CM | POA: Diagnosis not present

## 2014-12-15 DIAGNOSIS — I10 Essential (primary) hypertension: Secondary | ICD-10-CM | POA: Diagnosis not present

## 2014-12-15 DIAGNOSIS — Z79899 Other long term (current) drug therapy: Secondary | ICD-10-CM | POA: Diagnosis not present

## 2014-12-15 DIAGNOSIS — G4730 Sleep apnea, unspecified: Secondary | ICD-10-CM | POA: Diagnosis present

## 2014-12-15 DIAGNOSIS — J941 Fibrothorax: Secondary | ICD-10-CM | POA: Diagnosis not present

## 2014-12-15 DIAGNOSIS — Z4682 Encounter for fitting and adjustment of non-vascular catheter: Secondary | ICD-10-CM | POA: Diagnosis not present

## 2014-12-15 DIAGNOSIS — Z01811 Encounter for preprocedural respiratory examination: Secondary | ICD-10-CM | POA: Diagnosis not present

## 2014-12-15 DIAGNOSIS — E780 Pure hypercholesterolemia: Secondary | ICD-10-CM | POA: Diagnosis present

## 2014-12-15 DIAGNOSIS — J9383 Other pneumothorax: Secondary | ICD-10-CM | POA: Diagnosis not present

## 2014-12-15 DIAGNOSIS — Z794 Long term (current) use of insulin: Secondary | ICD-10-CM | POA: Diagnosis not present

## 2014-12-15 DIAGNOSIS — J939 Pneumothorax, unspecified: Secondary | ICD-10-CM | POA: Diagnosis not present

## 2014-12-15 DIAGNOSIS — C3431 Malignant neoplasm of lower lobe, right bronchus or lung: Secondary | ICD-10-CM | POA: Diagnosis not present

## 2014-12-15 DIAGNOSIS — H9190 Unspecified hearing loss, unspecified ear: Secondary | ICD-10-CM | POA: Diagnosis present

## 2014-12-15 HISTORY — DX: Reserved for inherently not codable concepts without codable children: IMO0001

## 2014-12-15 HISTORY — DX: Sleep apnea, unspecified: G47.30

## 2014-12-15 HISTORY — DX: Unspecified hearing loss, unspecified ear: H91.90

## 2014-12-15 LAB — CBC
HCT: 43 % (ref 39.0–52.0)
Hemoglobin: 14.7 g/dL (ref 13.0–17.0)
MCH: 30.5 pg (ref 26.0–34.0)
MCHC: 34.2 g/dL (ref 30.0–36.0)
MCV: 89.2 fL (ref 78.0–100.0)
Platelets: 162 10*3/uL (ref 150–400)
RBC: 4.82 MIL/uL (ref 4.22–5.81)
RDW: 14.5 % (ref 11.5–15.5)
WBC: 6.9 10*3/uL (ref 4.0–10.5)

## 2014-12-15 LAB — BLOOD GAS, ARTERIAL
Acid-Base Excess: 1.1 mmol/L (ref 0.0–2.0)
Bicarbonate: 25.1 mEq/L — ABNORMAL HIGH (ref 20.0–24.0)
Drawn by: 421801
FIO2: 0.21 %
O2 Saturation: 97.6 %
Patient temperature: 98.6
TCO2: 26.3 mmol/L (ref 0–100)
pCO2 arterial: 39.2 mmHg (ref 35.0–45.0)
pH, Arterial: 7.422 (ref 7.350–7.450)
pO2, Arterial: 99.7 mmHg (ref 80.0–100.0)

## 2014-12-15 LAB — URINALYSIS, ROUTINE W REFLEX MICROSCOPIC
Bilirubin Urine: NEGATIVE
Glucose, UA: >1000 mg/dL — AB
Hgb urine dipstick: NEGATIVE
Ketones, ur: NEGATIVE mg/dL
Leukocytes, UA: NEGATIVE
Nitrite: NEGATIVE
Protein, ur: NEGATIVE mg/dL
Specific Gravity, Urine: 1.038 — ABNORMAL HIGH (ref 1.005–1.030)
Urobilinogen, UA: 0.2 mg/dL (ref 0.0–1.0)
pH: 5 (ref 5.0–8.0)

## 2014-12-15 LAB — COMPREHENSIVE METABOLIC PANEL
ALT: 16 U/L — ABNORMAL LOW (ref 17–63)
AST: 20 U/L (ref 15–41)
Albumin: 3.5 g/dL (ref 3.5–5.0)
Alkaline Phosphatase: 63 U/L (ref 38–126)
Anion gap: 12 (ref 5–15)
BUN: 15 mg/dL (ref 6–20)
CO2: 21 mmol/L — ABNORMAL LOW (ref 22–32)
Calcium: 9.6 mg/dL (ref 8.9–10.3)
Chloride: 106 mmol/L (ref 101–111)
Creatinine, Ser: 0.93 mg/dL (ref 0.61–1.24)
GFR calc Af Amer: >60 mL/min (ref >60)
GFR calc non Af Amer: >60 mL/min (ref >60)
Glucose, Bld: 293 mg/dL — ABNORMAL HIGH (ref 65–99)
Potassium: 4.2 mmol/L (ref 3.5–5.1)
Sodium: 139 mmol/L (ref 135–145)
Total Bilirubin: 0.5 mg/dL (ref 0.3–1.2)
Total Protein: 6.5 g/dL (ref 6.5–8.1)

## 2014-12-15 LAB — PROTIME-INR
INR: 1.02 (ref 0.00–1.49)
Prothrombin Time: 13.6 seconds (ref 11.6–15.2)

## 2014-12-15 LAB — SURGICAL PCR SCREEN
MRSA, PCR: NEGATIVE
Staphylococcus aureus: NEGATIVE

## 2014-12-15 LAB — APTT: aPTT: 26 seconds (ref 24–37)

## 2014-12-15 LAB — URINE MICROSCOPIC-ADD ON

## 2014-12-15 LAB — GLUCOSE, CAPILLARY: Glucose-Capillary: 253 mg/dL — ABNORMAL HIGH (ref 65–99)

## 2014-12-15 LAB — TYPE AND SCREEN
ABO/RH(D): O POS
Antibody Screen: NEGATIVE

## 2014-12-15 NOTE — Progress Notes (Signed)
PCP is Stevan Born, FNP. Patient denied having any acute cardiac or pulmonary issues. Patient informed Nurse that he had a cardiac cath at the [facility] in [place], [place].  Nurse inquired about blood glucose and patient informed Nurse that he did not check it this morning, however the highest glucose has been was 325 and the lowest was 55. CBG upon arrival to short stay was 253. Patient stated he had consumed a egg omelette biscuit and drank some coffee this morning, and that he had taken 50 units of Lantus this morning.   Patients friend Luellen Pucker at chair side during PAT visit.

## 2014-12-15 NOTE — Pre-Procedure Instructions (Signed)
Timothy Guerrero  12/15/2014    Your procedure is scheduled on: Thursday December 17, 2014 at 10:36 AM.  Report to [facility] Admitting at 8:30 A.M.  Call this number if you have problems the morning of surgery: [phone]    Remember:  Do not eat food or drink liquids after midnight.  Take these medicines the morning of surgery with A SIP OF WATER: Clonidine (Catapres), Metoprolol (Toprol XL), and Spiriva inhaler   Take 25 units of Lantus the morning of your surgery   Do NOT take any diabetic pills the morning of your surgery (Ex. Glipizide/Glucotrol or Metformin/Glucophage)   Bring CPAP mask the day of your surgery   Do not wear jewelry.  Do not wear lotions, powders, or cologne.  You may NOT wear deodorant.  Men may shave face and neck.  Do not bring valuables to the hospital.  [facility] is not responsible for any belongings or valuables.  Contacts, dentures or bridgework may not be worn into surgery.  Leave your suitcase in the car.  After surgery it may be brought to your room.  For patients admitted to the hospital, discharge time will be determined by your treatment team.  Patients discharged the day of surgery will not be allowed to drive home.   Name and phone number of your driver:    Special instructions: Shower using CHG soap the night before and the morning of your surgery  Please read over the following fact sheets that you were given. Pain Booklet, Coughing and Deep Breathing, Blood Transfusion Information, MRSA Information and Surgical Site Infection Prevention

## 2014-12-15 NOTE — Progress Notes (Addendum)
Anesthesia Chart Review:  Pt is 74 year old male scheduled for VATS/wedge resection, possible R lower lobectomy on 12/17/14 with Dr. Roxan Hockey.   PMH includes: CAD (cath [facility]/19/12 at [facility]:  50% mid CX, 90% proximal RCA lesion (small nondominant vessel, not amenable to PCI)), HTN, DM, hypercholesterolemia, OSA, melanoma. Pt is hard of hearing. Former smoker. BMI 29.   Medications include: ASA, clonidine, glipizide, HCTZ, insulin, lisinopril, metformin, metoprolol.   Preoperative labs reviewed.  Glucose 293. HgbA1c 9.9. Pt reports morning blood sugars have been between 55 and 325. Notified Ryan in Dr. Leonarda Salon office.   Chest x-ray 12/15/2014 reviewed. R lung mass, likely in the superior segment of the right lower lobe.   EKG 12/15/2014: NSR. Nonspecific T wave abnormality  Echo 09/10/2014: -LV is normal in size -LV concentric hypertrophy of moderate degree is present -LV systolic function is normal -LV longitudinal strain is normal -mitral flow pattern and tissue doppler imaging suggest presence of LV diastolic dysfunction in the form of impaired relaxation (Grade I mild diastolic dysfunction)  Nuclear stress test 03/11/2014: 1. There is no evidence of ischemia 2. Normal LV cavity.  3. Normal LV regional wall motion 4. LV EF 55%  PFT 06/19/2014: -FVC and FEV1 normal.  -diffusing capacity is 59% of predicted which is moderately low -lung volumes showed a TLC of 6.15L which is 87% of predicted.   Willeen Cass, FNP-BC [facility]/Anesthesiology Phone: [phone] 12/16/2014 1:51 PM

## 2014-12-16 LAB — ABO/RH: ABO/RH(D): O POS

## 2014-12-16 LAB — HEMOGLOBIN A1C
Hgb A1c MFr Bld: 9.9 % — ABNORMAL HIGH (ref 4.8–5.6)
Mean Plasma Glucose: 237 mg/dL

## 2014-12-16 MED ORDER — CEFUROXIME SODIUM 1.5 G IJ SOLR
1.5000 g | INTRAMUSCULAR | Status: AC
  Administered 2014-12-17: 1.5 g via INTRAVENOUS
  Filled 2014-12-16: qty 1.5

## 2014-12-17 ENCOUNTER — Inpatient Hospital Stay (HOSPITAL_COMMUNITY): Payer: Commercial Managed Care - HMO | Admitting: Certified Registered"

## 2014-12-17 ENCOUNTER — Inpatient Hospital Stay (HOSPITAL_COMMUNITY)
Admission: RE | Admit: 2014-12-17 | Discharge: 2014-12-23 | DRG: 164 | Disposition: A | Discharge status: Home / Self Care | Payer: Commercial Managed Care - HMO | Source: Ambulatory Visit | Attending: Thoracic Surgery (Cardiothoracic Vascular Surgery) | Admitting: Thoracic Surgery (Cardiothoracic Vascular Surgery)

## 2014-12-17 ENCOUNTER — Encounter (HOSPITAL_COMMUNITY)
Admission: RE | Disposition: A | Discharge status: Home / Self Care | Payer: Self-pay | Source: Ambulatory Visit | Attending: Thoracic Surgery (Cardiothoracic Vascular Surgery)

## 2014-12-17 ENCOUNTER — Encounter (HOSPITAL_COMMUNITY): Payer: Self-pay | Admitting: Certified Registered"

## 2014-12-17 ENCOUNTER — Inpatient Hospital Stay (HOSPITAL_COMMUNITY): Payer: Commercial Managed Care - HMO | Admitting: Emergency Medicine

## 2014-12-17 ENCOUNTER — Inpatient Hospital Stay (HOSPITAL_COMMUNITY): Payer: Commercial Managed Care - HMO

## 2014-12-17 DIAGNOSIS — I4892 Unspecified atrial flutter: Secondary | ICD-10-CM | POA: Diagnosis not present

## 2014-12-17 DIAGNOSIS — R918 Other nonspecific abnormal finding of lung field: Secondary | ICD-10-CM

## 2014-12-17 DIAGNOSIS — E119 Type 2 diabetes mellitus without complications: Secondary | ICD-10-CM | POA: Diagnosis present

## 2014-12-17 DIAGNOSIS — Z79899 Other long term (current) drug therapy: Secondary | ICD-10-CM | POA: Diagnosis not present

## 2014-12-17 DIAGNOSIS — C3431 Malignant neoplasm of lower lobe, right bronchus or lung: Secondary | ICD-10-CM | POA: Diagnosis present

## 2014-12-17 DIAGNOSIS — Z87891 Personal history of nicotine dependence: Secondary | ICD-10-CM | POA: Diagnosis not present

## 2014-12-17 DIAGNOSIS — Z794 Long term (current) use of insulin: Secondary | ICD-10-CM

## 2014-12-17 DIAGNOSIS — Z96612 Presence of left artificial shoulder joint: Secondary | ICD-10-CM | POA: Diagnosis present

## 2014-12-17 DIAGNOSIS — Z96642 Presence of left artificial hip joint: Secondary | ICD-10-CM | POA: Diagnosis present

## 2014-12-17 DIAGNOSIS — H9190 Unspecified hearing loss, unspecified ear: Secondary | ICD-10-CM | POA: Diagnosis present

## 2014-12-17 DIAGNOSIS — I483 Typical atrial flutter: Secondary | ICD-10-CM | POA: Diagnosis not present

## 2014-12-17 DIAGNOSIS — R222 Localized swelling, mass and lump, trunk: Secondary | ICD-10-CM | POA: Diagnosis not present

## 2014-12-17 DIAGNOSIS — K567 Ileus, unspecified: Secondary | ICD-10-CM | POA: Diagnosis not present

## 2014-12-17 DIAGNOSIS — Z7982 Long term (current) use of aspirin: Secondary | ICD-10-CM | POA: Diagnosis not present

## 2014-12-17 DIAGNOSIS — Z8546 Personal history of malignant neoplasm of prostate: Secondary | ICD-10-CM

## 2014-12-17 DIAGNOSIS — Z01810 Encounter for preprocedural cardiovascular examination: Secondary | ICD-10-CM

## 2014-12-17 DIAGNOSIS — I10 Essential (primary) hypertension: Secondary | ICD-10-CM | POA: Diagnosis present

## 2014-12-17 DIAGNOSIS — Z09 Encounter for follow-up examination after completed treatment for conditions other than malignant neoplasm: Secondary | ICD-10-CM

## 2014-12-17 DIAGNOSIS — Z96653 Presence of artificial knee joint, bilateral: Secondary | ICD-10-CM | POA: Diagnosis present

## 2014-12-17 DIAGNOSIS — Z01812 Encounter for preprocedural laboratory examination: Secondary | ICD-10-CM | POA: Diagnosis not present

## 2014-12-17 DIAGNOSIS — G4730 Sleep apnea, unspecified: Secondary | ICD-10-CM | POA: Diagnosis present

## 2014-12-17 DIAGNOSIS — I471 Supraventricular tachycardia: Secondary | ICD-10-CM | POA: Diagnosis not present

## 2014-12-17 DIAGNOSIS — E780 Pure hypercholesterolemia: Secondary | ICD-10-CM | POA: Diagnosis present

## 2014-12-17 DIAGNOSIS — Z01818 Encounter for other preprocedural examination: Secondary | ICD-10-CM | POA: Diagnosis not present

## 2014-12-17 DIAGNOSIS — I2510 Atherosclerotic heart disease of native coronary artery without angina pectoris: Secondary | ICD-10-CM | POA: Diagnosis present

## 2014-12-17 DIAGNOSIS — Z902 Acquired absence of lung [part of]: Secondary | ICD-10-CM

## 2014-12-17 DIAGNOSIS — Z85820 Personal history of malignant melanoma of skin: Secondary | ICD-10-CM

## 2014-12-17 DIAGNOSIS — J939 Pneumothorax, unspecified: Secondary | ICD-10-CM

## 2014-12-17 HISTORY — PX: LOBECTOMY: SHX5089

## 2014-12-17 HISTORY — PX: VIDEO ASSISTED THORACOSCOPY (VATS)/WEDGE RESECTION: SHX6174

## 2014-12-17 HISTORY — DX: Unspecified atrial flutter: I48.92

## 2014-12-17 HISTORY — PX: OTHER SURGICAL HISTORY: SHX169

## 2014-12-17 HISTORY — DX: Malignant neoplasm of prostate: C61

## 2014-12-17 HISTORY — PX: LYMPH NODE DISSECTION: SHX5087

## 2014-12-17 LAB — GLUCOSE, CAPILLARY
Glucose-Capillary: 180 mg/dL — ABNORMAL HIGH (ref 65–99)
Glucose-Capillary: 189 mg/dL — ABNORMAL HIGH (ref 65–99)
Glucose-Capillary: 247 mg/dL — ABNORMAL HIGH (ref 65–99)
Glucose-Capillary: 266 mg/dL — ABNORMAL HIGH (ref 65–99)

## 2014-12-17 SURGERY — VIDEO ASSISTED THORACOSCOPY (VATS)/WEDGE RESECTION
Anesthesia: General | Site: Chest | Laterality: Right

## 2014-12-17 MED ORDER — POTASSIUM CHLORIDE 10 MEQ/50ML IV SOLN
10.0000 meq | Freq: Every day | INTRAVENOUS | Status: DC | PRN
  Administered 2014-12-19 (×2): 10 meq via INTRAVENOUS
  Filled 2014-12-17 (×3): qty 50

## 2014-12-17 MED ORDER — CLONIDINE HCL 0.2 MG PO TABS: 0.2000 mg | ORAL_TABLET | Freq: Once | ORAL | Status: DC

## 2014-12-17 MED ORDER — GLYCOPYRROLATE 0.2 MG/ML IJ SOLN
INTRAMUSCULAR | Status: AC
  Filled 2014-12-17: qty 4

## 2014-12-17 MED ORDER — FENTANYL CITRATE (PF) 250 MCG/5ML IJ SOLN
INTRAMUSCULAR | Status: AC
  Filled 2014-12-17: qty 5

## 2014-12-17 MED ORDER — ROCURONIUM BROMIDE 100 MG/10ML IV SOLN
INTRAVENOUS | Status: DC | PRN
  Administered 2014-12-17 (×3): 10 mg via INTRAVENOUS
  Administered 2014-12-17: 5 mg via INTRAVENOUS
  Administered 2014-12-17: 50 mg via INTRAVENOUS
  Administered 2014-12-17: 5 mg via INTRAVENOUS
  Administered 2014-12-17: 20 mg via INTRAVENOUS

## 2014-12-17 MED ORDER — NEOSTIGMINE METHYLSULFATE 10 MG/10ML IV SOLN
INTRAVENOUS | Status: AC
  Filled 2014-12-17: qty 1

## 2014-12-17 MED ORDER — LABETALOL HCL 5 MG/ML IV SOLN
INTRAVENOUS | Status: AC
  Filled 2014-12-17: qty 8

## 2014-12-17 MED ORDER — ONDANSETRON HCL 4 MG/2ML IJ SOLN
INTRAMUSCULAR | Status: DC | PRN
  Administered 2014-12-17: 4 mg via INTRAVENOUS

## 2014-12-17 MED ORDER — METOPROLOL SUCCINATE ER 100 MG PO TB24
100.0000 mg | ORAL_TABLET | Freq: Every day | ORAL | Status: DC
  Administered 2014-12-18 – 2014-12-23 (×6): 100 mg via ORAL
  Filled 2014-12-17 (×7): qty 1

## 2014-12-17 MED ORDER — PROPOFOL 10 MG/ML IV BOLUS
INTRAVENOUS | Status: AC
  Filled 2014-12-17: qty 20

## 2014-12-17 MED ORDER — CLONIDINE HCL 0.2 MG PO TABS
0.2000 mg | ORAL_TABLET | Freq: Every day | ORAL | Status: DC
  Administered 2014-12-18 – 2014-12-22 (×5): 0.2 mg via ORAL
  Filled 2014-12-17 (×6): qty 1

## 2014-12-17 MED ORDER — HYDROCHLOROTHIAZIDE 25 MG PO TABS
25.0000 mg | ORAL_TABLET | Freq: Every day | ORAL | Status: DC
Start: 2014-12-18 — End: 2014-12-23
  Administered 2014-12-18 – 2014-12-23 (×6): 25 mg via ORAL
  Filled 2014-12-17 (×6): qty 1

## 2014-12-17 MED ORDER — ASPIRIN 325 MG PO TABS
325.0000 mg | ORAL_TABLET | Freq: Every day | ORAL | Status: DC
  Administered 2014-12-18 – 2014-12-22 (×5): 325 mg via ORAL
  Filled 2014-12-17 (×6): qty 1

## 2014-12-17 MED ORDER — HYDROMORPHONE HCL 1 MG/ML IJ SOLN
0.2500 mg | INTRAMUSCULAR | Status: DC | PRN
  Administered 2014-12-17 (×4): 0.5 mg via INTRAVENOUS

## 2014-12-17 MED ORDER — BISACODYL 5 MG PO TBEC
10.0000 mg | DELAYED_RELEASE_TABLET | Freq: Every day | ORAL | Status: DC
  Administered 2014-12-18 – 2014-12-21 (×4): 10 mg via ORAL
  Filled 2014-12-17 (×4): qty 2

## 2014-12-17 MED ORDER — FENTANYL CITRATE (PF) 100 MCG/2ML IJ SOLN
INTRAMUSCULAR | Status: DC | PRN
  Administered 2014-12-17 (×3): 50 ug via INTRAVENOUS
  Administered 2014-12-17: 150 ug via INTRAVENOUS

## 2014-12-17 MED ORDER — DIPHENHYDRAMINE HCL 50 MG/ML IJ SOLN: 12.5000 mg | Freq: Four times a day (QID) | INTRAMUSCULAR | Status: DC | PRN

## 2014-12-17 MED ORDER — ACETAMINOPHEN 160 MG/5ML PO SOLN
1000.0000 mg | Freq: Four times a day (QID) | ORAL | Status: AC
  Administered 2014-12-21: 1000 mg via ORAL
  Filled 2014-12-17: qty 40

## 2014-12-17 MED ORDER — OXYCODONE HCL 5 MG PO TABS
5.0000 mg | ORAL_TABLET | ORAL | Status: DC | PRN
  Administered 2014-12-17 – 2014-12-18 (×2): 10 mg via ORAL
  Filled 2014-12-17: qty 2

## 2014-12-17 MED ORDER — ONDANSETRON HCL 4 MG/2ML IJ SOLN: 4.0000 mg | Freq: Four times a day (QID) | INTRAMUSCULAR | Status: DC | PRN

## 2014-12-17 MED ORDER — METOPROLOL SUCCINATE ER 100 MG PO TB24
100.0000 mg | ORAL_TABLET | Freq: Every day | ORAL | Status: DC
  Administered 2014-12-17: 100 mg via ORAL
  Filled 2014-12-17: qty 1

## 2014-12-17 MED ORDER — OXYCODONE HCL 5 MG/5ML PO SOLN: 5.0000 mg | Freq: Once | ORAL | Status: DC | PRN

## 2014-12-17 MED ORDER — FENTANYL CITRATE (PF) 100 MCG/2ML IJ SOLN
INTRAMUSCULAR | Status: AC
Start: 2014-12-17 — End: 2014-12-17
  Filled 2014-12-17: qty 2

## 2014-12-17 MED ORDER — SODIUM CHLORIDE 0.9 % IJ SOLN: 9.0000 mL | INTRAMUSCULAR | Status: DC | PRN

## 2014-12-17 MED ORDER — LABETALOL HCL 5 MG/ML IV SOLN
10.0000 mg | INTRAVENOUS | Status: DC | PRN
  Administered 2014-12-22: 10 mg via INTRAVENOUS
  Filled 2014-12-17 (×3): qty 4

## 2014-12-17 MED ORDER — BUPIVACAINE 0.5 % ON-Q PUMP SINGLE CATH 400 ML
INJECTION | Status: DC | PRN
  Administered 2014-12-17: 400 mL

## 2014-12-17 MED ORDER — INSULIN ASPART 100 UNIT/ML ~~LOC~~ SOLN
0.0000 [IU] | SUBCUTANEOUS | Status: DC
  Administered 2014-12-17 (×2): 8 [IU] via SUBCUTANEOUS
  Administered 2014-12-17: 12 [IU] via SUBCUTANEOUS

## 2014-12-17 MED ORDER — FENTANYL CITRATE (PF) 100 MCG/2ML IJ SOLN
100.0000 ug | Freq: Once | INTRAMUSCULAR | Status: AC
Start: 2014-12-17 — End: 2014-12-17
  Administered 2014-12-17: 50 ug via INTRAVENOUS

## 2014-12-17 MED ORDER — PHENYLEPHRINE 40 MCG/ML (10ML) SYRINGE FOR IV PUSH (FOR BLOOD PRESSURE SUPPORT)
PREFILLED_SYRINGE | INTRAVENOUS | Status: AC
  Filled 2014-12-17: qty 10

## 2014-12-17 MED ORDER — ONDANSETRON HCL 4 MG/2ML IJ SOLN
INTRAMUSCULAR | Status: AC
  Filled 2014-12-17: qty 2

## 2014-12-17 MED ORDER — HEMOSTATIC AGENTS (NO CHARGE) OPTIME
TOPICAL | Status: DC | PRN
  Administered 2014-12-17: 1 via TOPICAL

## 2014-12-17 MED ORDER — LIDOCAINE HCL (CARDIAC) 20 MG/ML IV SOLN
INTRAVENOUS | Status: AC
  Filled 2014-12-17: qty 5

## 2014-12-17 MED ORDER — DIPHENHYDRAMINE HCL 12.5 MG/5ML PO ELIX
12.5000 mg | ORAL_SOLUTION | Freq: Four times a day (QID) | ORAL | Status: DC | PRN
  Filled 2014-12-17: qty 5

## 2014-12-17 MED ORDER — ARTIFICIAL TEARS OP OINT
TOPICAL_OINTMENT | OPHTHALMIC | Status: AC
  Filled 2014-12-17: qty 3.5

## 2014-12-17 MED ORDER — LABETALOL HCL 5 MG/ML IV SOLN: 10.0000 mg | INTRAVENOUS | Status: DC | PRN

## 2014-12-17 MED ORDER — TRAMADOL HCL 50 MG PO TABS: 50.0000 mg | ORAL_TABLET | Freq: Four times a day (QID) | ORAL | Status: DC | PRN

## 2014-12-17 MED ORDER — HYDROMORPHONE HCL 1 MG/ML IJ SOLN
INTRAMUSCULAR | Status: AC
  Filled 2014-12-17: qty 2

## 2014-12-17 MED ORDER — MIDAZOLAM HCL 2 MG/2ML IJ SOLN
2.0000 mg | Freq: Once | INTRAMUSCULAR | Status: AC
  Administered 2014-12-17: 1 mg via INTRAVENOUS

## 2014-12-17 MED ORDER — LACTATED RINGERS IV SOLN
INTRAVENOUS | Status: DC
  Administered 2014-12-17: 10:00:00 via INTRAVENOUS

## 2014-12-17 MED ORDER — LABETALOL HCL 5 MG/ML IV SOLN
5.0000 mg | Freq: Once | INTRAVENOUS | Status: AC
  Administered 2014-12-17: 5 mg via INTRAVENOUS

## 2014-12-17 MED ORDER — OXYCODONE HCL 5 MG PO TABS: 5.0000 mg | ORAL_TABLET | Freq: Once | ORAL | Status: DC | PRN

## 2014-12-17 MED ORDER — BUPIVACAINE HCL (PF) 0.5 % IJ SOLN
INTRAMUSCULAR | Status: DC | PRN
  Administered 2014-12-17: 10 mL

## 2014-12-17 MED ORDER — SENNOSIDES-DOCUSATE SODIUM 8.6-50 MG PO TABS
1.0000 | ORAL_TABLET | Freq: Every day | ORAL | Status: DC
  Administered 2014-12-17 – 2014-12-22 (×4): 1 via ORAL
  Filled 2014-12-17 (×8): qty 1

## 2014-12-17 MED ORDER — CETYLPYRIDINIUM CHLORIDE 0.05 % MT LIQD
7.0000 mL | Freq: Two times a day (BID) | OROMUCOSAL | Status: DC
  Administered 2014-12-17 – 2014-12-22 (×10): 7 mL via OROMUCOSAL

## 2014-12-17 MED ORDER — LIDOCAINE HCL (CARDIAC) 20 MG/ML IV SOLN
INTRAVENOUS | Status: DC | PRN
  Administered 2014-12-17: 100 mg via INTRAVENOUS

## 2014-12-17 MED ORDER — LABETALOL HCL 5 MG/ML IV SOLN
10.0000 mg | INTRAVENOUS | Status: AC | PRN
  Administered 2014-12-17 (×3): 10 mg via INTRAVENOUS

## 2014-12-17 MED ORDER — BUPIVACAINE HCL (PF) 0.5 % IJ SOLN
INTRAMUSCULAR | Status: AC
  Filled 2014-12-17: qty 10

## 2014-12-17 MED ORDER — GLYCOPYRROLATE 0.2 MG/ML IJ SOLN
INTRAMUSCULAR | Status: DC | PRN
  Administered 2014-12-17: .8 mg via INTRAVENOUS

## 2014-12-17 MED ORDER — PROMETHAZINE HCL 25 MG/ML IJ SOLN: 6.2500 mg | INTRAMUSCULAR | Status: DC | PRN

## 2014-12-17 MED ORDER — LISINOPRIL 40 MG PO TABS
40.0000 mg | ORAL_TABLET | Freq: Every day | ORAL | Status: DC
  Administered 2014-12-18 – 2014-12-23 (×6): 40 mg via ORAL
  Filled 2014-12-17 (×6): qty 1

## 2014-12-17 MED ORDER — LABETALOL HCL 5 MG/ML IV SOLN
INTRAVENOUS | Status: DC | PRN
  Administered 2014-12-17 (×4): 5 mg via INTRAVENOUS

## 2014-12-17 MED ORDER — DEXTROSE-NACL 5-0.9 % IV SOLN
INTRAVENOUS | Status: DC
  Administered 2014-12-17 – 2014-12-18 (×2): via INTRAVENOUS

## 2014-12-17 MED ORDER — 0.9 % SODIUM CHLORIDE (POUR BTL) OPTIME
TOPICAL | Status: DC | PRN
  Administered 2014-12-17: 1000 mL
  Administered 2014-12-17: 2000 mL

## 2014-12-17 MED ORDER — MIDAZOLAM HCL 2 MG/2ML IJ SOLN
INTRAMUSCULAR | Status: AC
  Filled 2014-12-17: qty 2

## 2014-12-17 MED ORDER — ROCURONIUM BROMIDE 50 MG/5ML IV SOLN
INTRAVENOUS | Status: AC
  Filled 2014-12-17: qty 1

## 2014-12-17 MED ORDER — HYDROCHLOROTHIAZIDE 25 MG PO TABS: 25.0000 mg | ORAL_TABLET | Freq: Once | ORAL | Status: DC

## 2014-12-17 MED ORDER — PROPOFOL 10 MG/ML IV BOLUS
INTRAVENOUS | Status: DC | PRN
  Administered 2014-12-17: 200 mg via INTRAVENOUS

## 2014-12-17 MED ORDER — ALBUTEROL SULFATE (2.5 MG/3ML) 0.083% IN NEBU
2.5000 mg | INHALATION_SOLUTION | RESPIRATORY_TRACT | Status: DC
  Filled 2014-12-17: qty 3

## 2014-12-17 MED ORDER — NEOSTIGMINE METHYLSULFATE 10 MG/10ML IV SOLN
INTRAVENOUS | Status: DC | PRN
  Administered 2014-12-17: 5 mg via INTRAVENOUS

## 2014-12-17 MED ORDER — FENTANYL 10 MCG/ML IV SOLN
INTRAVENOUS | Status: DC
  Administered 2014-12-17: 15:00:00 via INTRAVENOUS
  Administered 2014-12-17: 100 ug via INTRAVENOUS
  Administered 2014-12-18: 60 ug via INTRAVENOUS
  Administered 2014-12-18: 20 ug via INTRAVENOUS
  Administered 2014-12-18: 80 ug via INTRAVENOUS
  Administered 2014-12-18: 11:00:00 via INTRAVENOUS
  Administered 2014-12-18: 190 ug via INTRAVENOUS
  Administered 2014-12-18: 0 ug via INTRAVENOUS
  Administered 2014-12-19: 40 ug via INTRAVENOUS
  Administered 2014-12-19: 20 ug via INTRAVENOUS
  Administered 2014-12-19: 30 ug via INTRAVENOUS
  Administered 2014-12-19: 10 ug via INTRAVENOUS
  Filled 2014-12-17 (×3): qty 50

## 2014-12-17 MED ORDER — ACETAMINOPHEN 325 MG PO TABS
ORAL_TABLET | ORAL | Status: AC
  Administered 2014-12-17: 975 mg via ORAL
  Filled 2014-12-17: qty 3

## 2014-12-17 MED ORDER — PANTOPRAZOLE SODIUM 40 MG PO TBEC
40.0000 mg | DELAYED_RELEASE_TABLET | Freq: Every day | ORAL | Status: DC
  Administered 2014-12-18 – 2014-12-23 (×6): 40 mg via ORAL
  Filled 2014-12-17 (×5): qty 1

## 2014-12-17 MED ORDER — EPHEDRINE SULFATE 50 MG/ML IJ SOLN
INTRAMUSCULAR | Status: DC | PRN
  Administered 2014-12-17 (×3): 5 mg via INTRAVENOUS

## 2014-12-17 MED ORDER — OXYCODONE HCL 5 MG PO TABS
ORAL_TABLET | ORAL | Status: AC
  Administered 2014-12-17: 10 mg via ORAL
  Filled 2014-12-17: qty 2

## 2014-12-17 MED ORDER — NALOXONE HCL 0.4 MG/ML IJ SOLN: 0.4000 mg | INTRAMUSCULAR | Status: DC | PRN

## 2014-12-17 MED ORDER — DEXTROSE 5 % IV SOLN
1.5000 g | Freq: Two times a day (BID) | INTRAVENOUS | Status: AC
  Administered 2014-12-17 – 2014-12-18 (×2): 1.5 g via INTRAVENOUS
  Filled 2014-12-17 (×2): qty 1.5

## 2014-12-17 MED ORDER — LABETALOL HCL 5 MG/ML IV SOLN
INTRAVENOUS | Status: AC
  Filled 2014-12-17: qty 4

## 2014-12-17 MED ORDER — PHENYLEPHRINE HCL 10 MG/ML IJ SOLN
INTRAMUSCULAR | Status: DC | PRN
  Administered 2014-12-17 (×5): 40 ug via INTRAVENOUS

## 2014-12-17 MED ORDER — TIOTROPIUM BROMIDE MONOHYDRATE 18 MCG IN CAPS
18.0000 ug | ORAL_CAPSULE | Freq: Every day | RESPIRATORY_TRACT | Status: DC
  Administered 2014-12-18 – 2014-12-23 (×6): 18 ug via RESPIRATORY_TRACT
  Filled 2014-12-17 (×2): qty 5

## 2014-12-17 MED ORDER — BUPIVACAINE 0.5 % ON-Q PUMP SINGLE CATH 400 ML
400.0000 mL | INJECTION | Status: DC
  Filled 2014-12-17: qty 400

## 2014-12-17 MED ORDER — ACETAMINOPHEN 500 MG PO TABS
1000.0000 mg | ORAL_TABLET | Freq: Four times a day (QID) | ORAL | Status: AC
  Administered 2014-12-17: 1000 mg via ORAL
  Administered 2014-12-17: 975 mg via ORAL
  Administered 2014-12-18 – 2014-12-22 (×14): 1000 mg via ORAL
  Filled 2014-12-17 (×20): qty 2

## 2014-12-17 SURGICAL SUPPLY — 96 items
ADH SKN CLS APL DERMABOND .7 (GAUZE/BANDAGES/DRESSINGS) ×1
APL SKNCLS STERI-STRIP NONHPOA (GAUZE/BANDAGES/DRESSINGS) ×1
BAG SPEC RTRVL LRG 6X4 10 (ENDOMECHANICALS) ×1
BENZOIN TINCTURE PRP APPL 2/3 (GAUZE/BANDAGES/DRESSINGS) ×2 IMPLANT
CANISTER SUCTION 2500CC (MISCELLANEOUS) ×2 IMPLANT
CATH KIT ON Q 5IN SLV (PAIN MANAGEMENT) ×2 IMPLANT
CATH THORACIC 28FR (CATHETERS) ×2 IMPLANT
CATH THORACIC 36FR (CATHETERS) IMPLANT
CATH THORACIC 36FR RT ANG (CATHETERS) IMPLANT
CLIP TI MEDIUM 6 (CLIP) ×4 IMPLANT
CONN ST 1/4X3/8  BEN (MISCELLANEOUS) ×1
CONN ST 1/4X3/8 BEN (MISCELLANEOUS) ×1 IMPLANT
CONN Y 3/8X3/8X3/8  BEN (MISCELLANEOUS)
CONN Y 3/8X3/8X3/8 BEN (MISCELLANEOUS) IMPLANT
CONT SPEC 4OZ CLIKSEAL STRL BL (MISCELLANEOUS) ×24 IMPLANT
COVER SURGICAL LIGHT HANDLE (MISCELLANEOUS) ×2 IMPLANT
CUTTER ECHEON FLEX ENDO 45 340 (ENDOMECHANICALS) ×2 IMPLANT
DERMABOND ADVANCED (GAUZE/BANDAGES/DRESSINGS) ×1
DERMABOND ADVANCED .7 DNX12 (GAUZE/BANDAGES/DRESSINGS) ×1 IMPLANT
DRAIN CHANNEL 28F RND 3/8 FF (WOUND CARE) ×2 IMPLANT
DRAIN CHANNEL 32F RND 10.7 FF (WOUND CARE) IMPLANT
DRAPE LAPAROSCOPIC ABDOMINAL (DRAPES) ×2 IMPLANT
DRAPE WARM FLUID 44X44 (DRAPE) ×2 IMPLANT
DRSG TEGADERM 4X4.75 (GAUZE/BANDAGES/DRESSINGS) ×2 IMPLANT
ELECT BLADE 6.5 EXT (BLADE) ×4 IMPLANT
ELECT REM PT RETURN 9FT ADLT (ELECTROSURGICAL) ×2
ELECTRODE REM PT RTRN 9FT ADLT (ELECTROSURGICAL) ×1 IMPLANT
GAUZE SPONGE 4X4 12PLY STRL (GAUZE/BANDAGES/DRESSINGS) ×2 IMPLANT
GLOVE BIO SURGEON STRL SZ7.5 (GLOVE) ×2 IMPLANT
GLOVE BIOGEL PI IND STRL 6.5 (GLOVE) ×4 IMPLANT
GLOVE BIOGEL PI INDICATOR 6.5 (GLOVE) ×4
GLOVE ECLIPSE 6.5 STRL STRAW (GLOVE) ×4 IMPLANT
GLOVE SURG SIGNA 7.5 PF LTX (GLOVE) ×4 IMPLANT
GLOVE SURG SS PI 6.5 STRL IVOR (GLOVE) ×2 IMPLANT
GOWN STRL REUS W/ TWL LRG LVL3 (GOWN DISPOSABLE) ×3 IMPLANT
GOWN STRL REUS W/ TWL XL LVL3 (GOWN DISPOSABLE) ×2 IMPLANT
GOWN STRL REUS W/TWL LRG LVL3 (GOWN DISPOSABLE) ×6
GOWN STRL REUS W/TWL XL LVL3 (GOWN DISPOSABLE) ×4
HANDLE STAPLE ENDO GIA SHORT (STAPLE)
HEMOSTAT SURGICEL 2X14 (HEMOSTASIS) ×2 IMPLANT
KIT BASIN OR (CUSTOM PROCEDURE TRAY) ×2 IMPLANT
KIT ROOM TURNOVER OR (KITS) ×2 IMPLANT
KIT SUCTION CATH 14FR (SUCTIONS) IMPLANT
NS IRRIG 1000ML POUR BTL (IV SOLUTION) ×4 IMPLANT
PACK CHEST (CUSTOM PROCEDURE TRAY) ×2 IMPLANT
PAD ARMBOARD 7.5X6 YLW CONV (MISCELLANEOUS) ×4 IMPLANT
PENCIL BUTTON HOLSTER BLD 10FT (ELECTRODE) ×2 IMPLANT
POUCH ENDO CATCH II 15MM (MISCELLANEOUS) ×2 IMPLANT
POUCH SPECIMEN RETRIEVAL 10MM (ENDOMECHANICALS) ×2 IMPLANT
RELOAD GOLD ECHELON 45 (STAPLE) ×14 IMPLANT
RELOAD GREEN ECHELON 45 (STAPLE) ×4 IMPLANT
SCISSORS ENDO CVD 5DCS (MISCELLANEOUS) IMPLANT
SEALANT PROGEL (MISCELLANEOUS) IMPLANT
SEALANT SURG COSEAL 4ML (VASCULAR PRODUCTS) IMPLANT
SEALANT SURG COSEAL 8ML (VASCULAR PRODUCTS) IMPLANT
SOLUTION ANTI FOG 6CC (MISCELLANEOUS) ×2 IMPLANT
SPECIMEN JAR MEDIUM (MISCELLANEOUS) IMPLANT
SPONGE GAUZE 4X4 12PLY STER LF (GAUZE/BANDAGES/DRESSINGS) ×2 IMPLANT
SPONGE INTESTINAL PEANUT (DISPOSABLE) ×2 IMPLANT
STAPLE RELOAD 2.5MM WHITE (STAPLE) ×6 IMPLANT
STAPLER ENDO GIA 12MM SHORT (STAPLE) IMPLANT
STAPLER VASCULAR ECHELON 35 (CUTTER) ×2 IMPLANT
SUT PROLENE 4 0 RB 1 (SUTURE)
SUT PROLENE 4-0 RB1 .5 CRCL 36 (SUTURE) IMPLANT
SUT SILK  1 MH (SUTURE) ×2
SUT SILK 1 MH (SUTURE) ×2 IMPLANT
SUT SILK 1 TIES 10X30 (SUTURE) ×2 IMPLANT
SUT SILK 2 0 SH (SUTURE) IMPLANT
SUT SILK 2 0SH CR/8 30 (SUTURE) IMPLANT
SUT SILK 3 0 SH 30 (SUTURE) ×2 IMPLANT
SUT SILK 3 0SH CR/8 30 (SUTURE) IMPLANT
SUT VIC AB 0 CTX 27 (SUTURE) IMPLANT
SUT VIC AB 1 CTX 27 (SUTURE) ×2 IMPLANT
SUT VIC AB 2-0 CT1 27 (SUTURE)
SUT VIC AB 2-0 CT1 TAPERPNT 27 (SUTURE) IMPLANT
SUT VIC AB 2-0 CTX 36 (SUTURE) ×2 IMPLANT
SUT VIC AB 3-0 MH 27 (SUTURE) ×4 IMPLANT
SUT VIC AB 3-0 SH 27 (SUTURE)
SUT VIC AB 3-0 SH 27X BRD (SUTURE) IMPLANT
SUT VIC AB 3-0 X1 27 (SUTURE) ×2 IMPLANT
SUT VICRYL 0 UR6 27IN ABS (SUTURE) IMPLANT
SUT VICRYL 2 TP 1 (SUTURE) IMPLANT
SWAB COLLECTION DEVICE MRSA (MISCELLANEOUS) IMPLANT
SYSTEM SAHARA CHEST DRAIN ATS (WOUND CARE) ×2 IMPLANT
TAPE CLOTH SURG 4X10 WHT LF (GAUZE/BANDAGES/DRESSINGS) ×2 IMPLANT
TIP APPLICATOR SPRAY EXTEND 16 (VASCULAR PRODUCTS) IMPLANT
TOWEL OR 17X24 6PK STRL BLUE (TOWEL DISPOSABLE) IMPLANT
TOWEL OR 17X26 10 PK STRL BLUE (TOWEL DISPOSABLE) ×2 IMPLANT
TRAP SPECIMEN MUCOUS 40CC (MISCELLANEOUS) IMPLANT
TRAY FOLEY CATH 16FRSI W/METER (SET/KITS/TRAYS/PACK) ×2 IMPLANT
TROCAR FLEXIPATH THORACIC 15MM (ENDOMECHANICALS) ×2 IMPLANT
TROCAR XCEL BLADELESS 5X75MML (TROCAR) ×2 IMPLANT
TROCAR XCEL NON-BLD 5MMX100MML (ENDOMECHANICALS) IMPLANT
TUBE ANAEROBIC SPECIMEN COL (MISCELLANEOUS) IMPLANT
TUNNELER SHEATH ON-Q 11GX8 DSP (PAIN MANAGEMENT) ×2 IMPLANT
WATER STERILE IRR 1000ML POUR (IV SOLUTION) ×2 IMPLANT

## 2014-12-17 NOTE — Interval H&P Note (Signed)
History and Physical Interval Note:  12/17/2014 9:47 AM  Timothy Guerrero  has presented today for surgery, with the diagnosis of RLL MASS  The various methods of treatment have been discussed with the patient and family. After consideration of risks, benefits and other options for treatment, the patient has consented to  Procedure(s): VIDEO ASSISTED THORACOSCOPY (VATS)/WEDGE RESECTION (Right) POSSIBLE RIGHT LOWER LOBECTOMY (Right) as a surgical intervention .  The patient's history has been reviewed, patient examined, no change in status, stable for surgery.  I have reviewed the patient's chart and labs.  Questions were answered to the patient's satisfaction.     Melrose Nakayama

## 2014-12-17 NOTE — Anesthesia Procedure Notes (Addendum)
Procedure Name: Intubation Date/Time: 12/17/2014 10:47 AM Performed by: Barrington Ellison Pre-anesthesia Checklist: Patient identified, Emergency Drugs available, Suction available, Patient being monitored and Timeout performed Patient Re-evaluated:Patient Re-evaluated prior to inductionOxygen Delivery Method: Circle system utilized Preoxygenation: Pre-oxygenation with 100% oxygen Intubation Type: IV induction Ventilation: Mask ventilation without difficulty Laryngoscope Size: Mac and 3 Grade View: Grade II Tube type: Oral Endobronchial tube: Left and Double lumen EBT and 39 Fr Number of attempts: 1 Airway Equipment and Method: Stylet and Fiberoptic brochoscope Placement Confirmation: ETT inserted through vocal cords under direct vision,  positive ETCO2 and breath sounds checked- equal and bilateral Secured at: 30 cm Tube secured with: Tape Dental Injury: Teeth and Oropharynx as per pre-operative assessment

## 2014-12-17 NOTE — Anesthesia Postprocedure Evaluation (Signed)
Anesthesia Post Note  Patient: Timothy Guerrero  Procedure(s) Performed: Procedure(s) (LRB): RIGHT VIDEO ASSISTED THORACOSCOPY (VATS) (Right) RIGHT LOWER LOBECTOMY (Right) LYMPH NODE DISSECTION (Right)  Anesthesia type: General  Patient location: PACU  Post pain: Pain level controlled and Adequate analgesia  Post assessment: Post-op Vital signs reviewed, Patient's Cardiovascular Status Stable, Respiratory Function Stable, Patent Airway and Pain level controlled  Last Vitals:  Filed Vitals:   12/17/14 1600  BP: 144/71  Pulse: 83  Temp:   Resp: 14    Post vital signs: Reviewed and stable  Level of consciousness: awake, alert  and oriented  Complications: No apparent anesthesia complications

## 2014-12-17 NOTE — Progress Notes (Signed)
      [address]       [place] [address]             [phone]      Some incisional pain, using PCA  BP 145/63 mmHg  Pulse 87  Temp(Src) 98 F (36.7 C) (Oral)  Resp 28  Wt 216 lb 8 oz (98.204 kg)  SpO2 94%   Intake/Output Summary (Last 24 hours) at 12/17/14 1925 Last data filed at 12/17/14 1800  Gross per 24 hour  Intake 1898.17 ml  Output    878 ml  Net 1020.17 ml    No air leak  Doing well early postop  Labetalol PRN for HTN  Steven C. Roxan Hockey, MD [facility] [phone]

## 2014-12-17 NOTE — Anesthesia Preprocedure Evaluation (Addendum)
Anesthesia Evaluation  Patient identified by MRN, date of birth, ID band Patient awake    Reviewed: Allergy & Precautions, NPO status , Patient's Chart, lab work & pertinent test results, reviewed documented beta blocker date and time   Airway Mallampati: II  TM Distance: >3 FB Neck ROM: Full    Dental  (+) Poor Dentition, Partial Lower, Missing, Dental Advisory Given   Pulmonary sleep apnea , former smoker,  RLL lung mass breath sounds clear to auscultation        Cardiovascular hypertension, Pt. on medications and Pt. on home beta blockers + CAD Rhythm:Regular Rate:Normal     Neuro/Psych negative neurological ROS     GI/Hepatic negative GI ROS, Neg liver ROS,   Endo/Other  diabetes, Type 2, Oral Hypoglycemic Agents  Renal/GU negative Renal ROS     Musculoskeletal   Abdominal   Peds  Hematology negative hematology ROS (+)   Anesthesia Other Findings   Reproductive/Obstetrics                           Anesthesia Physical Anesthesia Plan  ASA: III  Anesthesia Plan: General   Post-op Pain Management:    Induction: Intravenous  Airway Management Planned: Double Lumen EBT  Additional Equipment: Arterial line, CVP and Ultrasound Guidance Line Placement  Intra-op Plan:   Post-operative Plan: Extubation in OR  Informed Consent: I have reviewed the patients History and Physical, chart, labs and discussed the procedure including the risks, benefits and alternatives for the proposed anesthesia with the patient or authorized representative who has indicated his/her understanding and acceptance.   Dental advisory given  Plan Discussed with: CRNA  Anesthesia Plan Comments:         Anesthesia Quick Evaluation

## 2014-12-17 NOTE — Brief Op Note (Addendum)
12/17/2014      [address]       ,[place] [address]             [phone]     12/17/2014  2:17 PM  PATIENT:  Timothy Guerrero  74 y.o. male  PRE-OPERATIVE DIAGNOSIS:   RIGHT LOWER LOBE LUNG MASS  POST-OPERATIVE DIAGNOSIS:  RIGHT LOWER LOBE NON-SMALL CELL CARCINOMA, CLINICAL STAGE IB  PROCEDURE:  Procedure(s):  RIGHT VIDEO ASSISTED THORACOSCOPY (VATS) THORACOSCOPIC RLL SUPERIOR SEGMENTECTOMY THORACOSCOPIC RIGHT LOWER LOBECTOMY MEDIASTINAL LYMPH NODE DISSECTION PLEURAL BX ON-Q LOCAL ANESTHETIC CATHETER PLACEMENT  SURGEON:  Surgeon(s): Melrose Nakayama, MD  PHYSICIAN ASSISTANT: WAYNE GOLD PA-C  ANESTHESIA:   general  SPECIMEN:  Source of Specimen:  AS ABOVE  DISPOSITION OF SPECIMEN:  Pathology  DRAINS: 2 Chest Tube(s) in the RIGHT HEMITHORAX   PATIENT CONDITION:  PACU - hemodynamically stable.  PRE-OPERATIVE WEIGHT: 98kg  FROZEN: NON-SMALL CELL CARCINOMA, FAVORS SQUAMOUS CELL FOR LUNG MASS. PLEURAL PLAQUE- BENIGN CALCIFICATION. BRONCHIAL MARGIN NEGATIVE  COMPLICATIONS: NO KNOWN  EBL: 150 ML

## 2014-12-17 NOTE — Transfer of Care (Signed)
Immediate Anesthesia Transfer of Care Note  Patient: Timothy Guerrero  Procedure(s) Performed: Procedure(s): RIGHT VIDEO ASSISTED THORACOSCOPY (VATS) (Right) RIGHT LOWER LOBECTOMY (Right) LYMPH NODE DISSECTION (Right)  Patient Location: PACU  Anesthesia Type:General  Level of Consciousness: awake  Airway & Oxygen Therapy: Patient Spontanous Breathing and Patient connected to face mask oxygen  Post-op Assessment: Report given to RN  Post vital signs: Reviewed and stable  Last Vitals:  Filed Vitals:   12/17/14 1005  BP: 240/104  Pulse: 82  Temp:   Resp: 9    Complications: No apparent anesthesia complications

## 2014-12-17 NOTE — H&P (View-Only) (Signed)
PCP is Oxford, Orson Ape, FNP Referring Provider is Lysbeth Penner, FNP  Chief Complaint  Patient presents with  . Lung Cancer    Surgical eval, PET Scan, Chest CT 09/17/14, CT guided neddle BX 11/06/14    HPI: Timothy Guerrero is a 74 year old gentleman with a remote history of tobacco abuse and history of multiple melanomas who to have a right lower lobe mass on CT scan done at the [place] in [place].   He recently was found to have a 3.5 cm spiculated mass in the right lower lobe on a CT scan that was not present 2 years prior. A PET CT was done and the mass was hypermetabolic. There also was a relatively normal-appearing right paratracheal node that was mildly hypermetabolic as well. He had a bronchoscopic biopsy which was unrevealing and then underwent a CT-guided biopsy on May 6. He never received result from that. We subsequently received word from [facility] that it showed atypical cells.  He has had some issues with chest pain dating back for a couple of years. This is mostly right side and sometimes exertional. He's been worked up with multiple cardiac studies and pulmonary function testing, which have failed to show any underlying cause. Of note his is recent Cardiolite was in September and was low risk for ischemia. He says the pain has been worse since the needle biopsy was done. There is a positional component is worse when he lies on his right side.  He has had no change in appetite or weight loss. He does complain of decreased energy. Has arthritis and has had knee, hip, and shoulder surgeries. He had prostate cancer in 2001 and had a resection. He has had 3 melanomas resected most recently was on his back. He has type 2 diabetes and is on Lantus 60 units daily in addition to Glucotrol and metformin.   Zubrod Score: At the time of surgery this patient's most appropriate activity status/level should be described as: '[]'$     0    Normal activity, no symptoms '[x]'$     1    Restricted in  physical strenuous activity but ambulatory, able to do out light work '[]'$     2    Ambulatory and capable of self care, unable to do work activities, up and about >50 % of waking hours                              '[]'$     3    Only limited self care, in bed greater than 50% of waking hours '[]'$     4    Completely disabled, no self care, confined to bed or chair '[]'$     5    Moribund  Past Medical History  Diagnosis Date  . Diabetes mellitus without complication   . Hypertension   . Hypercholesteremia   . Colon polyp     2007, polyp with early cancer, one year f/u no residual polyp. next TCS 2010, see PSH. [facility].  . Skin melanoma     Past Surgical History  Procedure Laterality Date  . Prostate surgery    . Knee replacements      bilateral. over 10 years ago  . Neck surgery    . Total hip arthroplasty  2007    left  . Total shoulder replacement  2011    left  . Colonoscopy  08/2008    Dr.  Leafy Half, [place]: normal, internal grade 1 hemorrhoids. next TCS 08/2013  . Colonoscopy N/A 03/04/2014    Procedure: COLONOSCOPY;  Surgeon: Daneil Dolin, MD;  Location: [facility];  Service: Endoscopy;  Laterality: N/A;  7:30am    Family History  Problem Relation Age of Onset  . Colon cancer Neg Hx     Social History History  Substance Use Topics  . Smoking status: Former Smoker -- 2.00 packs/day    Types: Cigarettes    Quit date: 07/03/1986  . Smokeless tobacco: Not on file  . Alcohol Use: No    Current Outpatient Prescriptions  Medication Sig Dispense Refill  . aspirin 325 MG tablet Take 325 mg by mouth daily.    . cloNIDine (CATAPRES) 0.2 MG tablet Take 0.2 mg by mouth daily.     [name] glipiZIDE (GLUCOTROL) 10 MG tablet Take 10 mg by mouth daily.    . hydrochlorothiazide (HYDRODIURIL) 25 MG tablet Take 25 mg by mouth daily.    [name] ibuprofen (ADVIL,MOTRIN) 400 MG tablet Take 400 mg by mouth 2 (two) times daily.     . insulin glargine (LANTUS) 100  UNIT/ML injection Inject 60 Units into the skin daily.    [name] lisinopril (PRINIVIL,ZESTRIL) 40 MG tablet Take 40 mg by mouth daily.    . metFORMIN (GLUCOPHAGE) 1000 MG tablet Take 1,000 mg by mouth 2 (two) times daily.    . metoprolol succinate (TOPROL-XL) 100 MG 24 hr tablet Take 100 mg by mouth daily.      No current facility-administered medications for this visit.    No Known Allergies  Review of Systems  Constitutional: Positive for fatigue. Negative for fever, chills, activity change, appetite change and unexpected weight change.  Eyes: Positive for redness (right eye started a week ago). Negative for discharge and visual disturbance.  Respiratory: Positive for cough (non productive) and shortness of breath (with heavy activity).   Cardiovascular: Positive for chest pain (right sided, worse since needle biopsy, CP with exertion with multiple prior negative cardiolites).  Gastrointestinal: Negative.   Endocrine: Negative for cold intolerance and heat intolerance.  Genitourinary: Negative for dysuria, urgency, frequency and difficulty urinating.       Prostatectomy in 2001  Musculoskeletal: Positive for arthralgias.  Neurological: Positive for dizziness. Negative for syncope, numbness and headaches.  Hematological: Negative for adenopathy. Bruises/bleeds easily.  All other systems reviewed and are negative.   BP 130/74 mmHg  Pulse 86  Resp 20  Ht 6' (1.829 m)  Wt 210 lb (95.255 kg)  BMI 28.47 kg/m2  SpO2 97% Physical Exam  Constitutional: He is oriented to person, place, and time. He appears well-developed and well-nourished. No distress.  HENT:  Head: Normocephalic and atraumatic.  Eyes: EOM are normal. Pupils are equal, round, and reactive to light.  Right Sclera red, no exudate  Neck: Neck supple. No thyromegaly present.  Cardiovascular: Normal rate, regular rhythm, normal heart sounds and intact distal pulses.  Exam reveals no gallop and no friction rub.   No murmur  heard. Pulmonary/Chest: Effort normal and breath sounds normal. He has no wheezes. He has no rales.  Abdominal: Soft. There is no tenderness.  Musculoskeletal: He exhibits no edema.  Lymphadenopathy:    He has no cervical adenopathy.  Neurological: He is alert and oriented to person, place, and time. No cranial nerve deficit.  No motor deficit  Skin: Skin is warm and dry.  Psychiatric: He has a normal mood and affect.  Vitals  reviewed.    Diagnostic Tests: I personally reviewed the images and reports of the CT and PET/CT from [facility].  CT showed a spiculated 3.5- 4 cm right lower lobe mass abutting the major fissure. There was no mediastinal or hilar adenopathy.  PET/CT showed the right lower lobe mass was hyper metabolic with an SUV of 35.3. 1 cm right paratracheal node with a fatty hilum and SUV max of 3.8. No other suspicious findings  Pulmonary function testing showed normal FVC and FEV1 and a DLCO of 60%  Impression: 74 year old man with a remote history of tobacco abuse as well as a history of melanoma and prostate cancer who presents with a spiculated right lower lobe nodule abutting the major fissure. It is markedly hypermetabolic on PET CT with an SUV of 11.5. Biopsies done at the [facility] showed atypical cells, but were nondiagnostic.  I discussed our options. We could try to repeat biopsies. However given the previously unsuccessful biopsy attempts in the highly suspicious nature of the lesion I think it is best to go ahead with surgical resection. This will give [place] a definitive diagnosis as well as potentially definitively treat the lesion.  I recommended that we proceed with a right VATS, wedge resection, possible right lower lobectomy depending on frozen section findings. I described the general nature of the operation, including the need for general anesthesia, the incisions to be used, postoperative chest tube drainage, expected hospital stay, and overall  recovery. I reviewed the indications, risks, benefits, and alternatives. He understands the risks include, but are not limited to death, MI, stroke, DVT, PE, bleeding, possible need for transfusion, infection, prolonged air leak, cardiac arrhythmias, as well as the possibility of other unforeseeable complications. He accepts the risks and wishes to proceed.  Plan: Right VATS, wedge resection, possible right lower lobectomy on Thursday, 12/17/2014.  Melrose Nakayama, MD [facility] [phone]

## 2014-12-18 ENCOUNTER — Inpatient Hospital Stay (HOSPITAL_COMMUNITY): Payer: Commercial Managed Care - HMO

## 2014-12-18 ENCOUNTER — Encounter (HOSPITAL_COMMUNITY): Payer: Self-pay | Admitting: Thoracic Surgery (Cardiothoracic Vascular Surgery)

## 2014-12-18 LAB — POCT I-STAT 3, ART BLOOD GAS (G3+)
Acid-Base Excess: 1 mmol/L (ref 0.0–2.0)
Bicarbonate: 26.9 mEq/L — ABNORMAL HIGH (ref 20.0–24.0)
O2 Saturation: 90 %
Patient temperature: 97.8
TCO2: 28 mmol/L (ref 0–100)
pCO2 arterial: 47.7 mmHg — ABNORMAL HIGH (ref 35.0–45.0)
pH, Arterial: 7.357 (ref 7.350–7.450)
pO2, Arterial: 59 mmHg — ABNORMAL LOW (ref 80.0–100.0)

## 2014-12-18 LAB — BASIC METABOLIC PANEL
Anion gap: 5 (ref 5–15)
BUN: 12 mg/dL (ref 6–20)
CO2: 28 mmol/L (ref 22–32)
Calcium: 8.3 mg/dL — ABNORMAL LOW (ref 8.9–10.3)
Chloride: 110 mmol/L (ref 101–111)
Creatinine, Ser: 0.74 mg/dL (ref 0.61–1.24)
GFR calc Af Amer: >60 mL/min (ref >60)
GFR calc non Af Amer: >60 mL/min (ref >60)
Glucose, Bld: 103 mg/dL — ABNORMAL HIGH (ref 65–99)
Potassium: 3.6 mmol/L (ref 3.5–5.1)
Sodium: 143 mmol/L (ref 135–145)

## 2014-12-18 LAB — GLUCOSE, CAPILLARY
Glucose-Capillary: 205 mg/dL — ABNORMAL HIGH (ref 65–99)
Glucose-Capillary: 100 mg/dL — ABNORMAL HIGH (ref 65–99)
Glucose-Capillary: 140 mg/dL — ABNORMAL HIGH (ref 65–99)
Glucose-Capillary: 315 mg/dL — ABNORMAL HIGH (ref 65–99)
Glucose-Capillary: 223 mg/dL — ABNORMAL HIGH (ref 65–99)

## 2014-12-18 LAB — CBC
HCT: 37 % — ABNORMAL LOW (ref 39.0–52.0)
Hemoglobin: 11.9 g/dL — ABNORMAL LOW (ref 13.0–17.0)
MCH: 29.7 pg (ref 26.0–34.0)
MCHC: 32.2 g/dL (ref 30.0–36.0)
MCV: 92.3 fL (ref 78.0–100.0)
Platelets: 164 10*3/uL (ref 150–400)
RBC: 4.01 MIL/uL — ABNORMAL LOW (ref 4.22–5.81)
RDW: 15.1 % (ref 11.5–15.5)
WBC: 10.9 10*3/uL — ABNORMAL HIGH (ref 4.0–10.5)

## 2014-12-18 MED ORDER — ENOXAPARIN SODIUM 40 MG/0.4ML ~~LOC~~ SOLN
40.0000 mg | SUBCUTANEOUS | Status: DC
  Administered 2014-12-18 – 2014-12-23 (×6): 40 mg via SUBCUTANEOUS
  Filled 2014-12-18 (×8): qty 0.4

## 2014-12-18 MED ORDER — INSULIN ASPART 100 UNIT/ML ~~LOC~~ SOLN
0.0000 [IU] | Freq: Every day | SUBCUTANEOUS | Status: DC
  Administered 2014-12-18 – 2014-12-20 (×3): 2 [IU] via SUBCUTANEOUS

## 2014-12-18 MED ORDER — SODIUM CHLORIDE 0.45 % IV SOLN
INTRAVENOUS | Status: DC
  Administered 2014-12-18: 08:00:00 via INTRAVENOUS

## 2014-12-18 MED ORDER — POTASSIUM CHLORIDE 10 MEQ/50ML IV SOLN
10.0000 meq | INTRAVENOUS | Status: AC
  Administered 2014-12-18 (×3): 10 meq via INTRAVENOUS
  Filled 2014-12-18 (×2): qty 50

## 2014-12-18 MED ORDER — METOCLOPRAMIDE HCL 5 MG/ML IJ SOLN
10.0000 mg | Freq: Four times a day (QID) | INTRAMUSCULAR | Status: AC
  Administered 2014-12-18 (×4): 10 mg via INTRAVENOUS
  Filled 2014-12-18 (×3): qty 2

## 2014-12-18 MED ORDER — INSULIN ASPART 100 UNIT/ML ~~LOC~~ SOLN
0.0000 [IU] | Freq: Three times a day (TID) | SUBCUTANEOUS | Status: DC
  Administered 2014-12-18: 2 [IU] via SUBCUTANEOUS
  Administered 2014-12-18: 11 [IU] via SUBCUTANEOUS
  Administered 2014-12-19 – 2014-12-20 (×4): 5 [IU] via SUBCUTANEOUS
  Administered 2014-12-20: 8 [IU] via SUBCUTANEOUS
  Administered 2014-12-20 – 2014-12-21 (×4): 5 [IU] via SUBCUTANEOUS
  Administered 2014-12-22: 2 [IU] via SUBCUTANEOUS
  Administered 2014-12-22: 3 [IU] via SUBCUTANEOUS
  Administered 2014-12-23: 2 [IU] via SUBCUTANEOUS

## 2014-12-18 MED ORDER — ALBUTEROL SULFATE (2.5 MG/3ML) 0.083% IN NEBU: 2.5000 mg | INHALATION_SOLUTION | Freq: Four times a day (QID) | RESPIRATORY_TRACT | Status: DC | PRN

## 2014-12-18 NOTE — Progress Notes (Signed)
Utilization review completed.  

## 2014-12-18 NOTE — Progress Notes (Signed)
Inpatient Diabetes Program Recommendations  AACE/ADA: New Consensus Statement on Inpatient Glycemic Control (2013)  Target Ranges:  Prepandial:   less than 140 mg/dL      Peak postprandial:   less than 180 mg/dL (1-2 hours)      Critically ill patients:  140 - 180 mg/dL   Reason for Visit: Hyperglycemia  Diabetes history: DM2 Outpatient Diabetes medications: Lantus 50 units QD, metformin 1000 mg bid, glipizide 10 mg QD Current orders for Inpatient glycemic control: Novolog moderate tidwc and hs  Results for Timothy Guerrero, Timothy Guerrero (MRN [id number]) as of 12/18/2014 09:38  Ref. Range 12/17/2014 17:05 12/17/2014 19:46 12/17/2014 23:25 12/18/2014 04:17 12/18/2014 07:41  Glucose-Capillary Latest Ref Range: 65-99 mg/dL 247 (H) 266 (H) 205 (H) 100 (H) 140 (H)   Results for Timothy Guerrero, Timothy Guerrero (MRN [id number]) as of 12/18/2014 09:38  Ref. Range 12/15/2014 11:12  Hemoglobin A1C Latest Ref Range: 4.8-5.6 % 9.9 (H)    Blood sugars much improved today. Pt has not received Lantus since being inpatient. HgbA1C of 9.9% indicates poor glycemic control prior to admission. Will speak with pt regarding home meds and monitoring this afternoon.  Thank you. Lorenda Peck, RD, LDN, CDE Inpatient Diabetes Coordinator [phone]

## 2014-12-18 NOTE — Op Note (Signed)
Timothy Guerrero, Timothy Guerrero                ACCOUNT NO.:  [id number]  MEDICAL RECORD NO.:  [id number]  LOCATION:                                 FACILITY:  PHYSICIAN:  Revonda Standard. Roxan Hockey, M.D.DATE OF BIRTH:  [birth year]  DATE OF PROCEDURE:  12/17/2014 DATE OF DISCHARGE:                              OPERATIVE REPORT   PREOPERATIVE DIAGNOSIS:  Right lower lobe mass.  POSTOPERATIVE DIAGNOSIS:  Non-small cell carcinoma, right lower lobe, clinical stage IB.  PROCEDURE:   Right video-assisted thoracoscopy Pleural biopsy Thoracoscopic right lower lobe superior segmentectomy Thoracoscopic right lower lobectomy Mediastinal lymph node dissection ON-Q local anesthetic catheter placement.  SURGEONS:  Revonda Standard. Roxan Hockey, M.D.  ASSISTANT:  John Giovanni, P.A.-C.  ANESTHESIA:  General.  FINDINGS:  Nodular pleural plaques, frozen section revealed benign fibrosis and calcification.  A 3 cm mass in the superior segment of the right lower lobe with possible involvement through the major fissure. Frozen section revealed non-small cell carcinoma with involvement of the parenchymal margin.  Right lower lobectomy, bronchial margins negative.  CLINICAL NOTE:  Timothy Guerrero is a 74 year old gentleman, with a remote history of tobacco abuse and also with a history of multiple cutaneous melanomas.  He recently was found to have a 3.5 cm spiculated mass in the right lower lobe.  This was a new mass that was not present 2 years prior.  A PET-CT was done and the mass was hypermetabolic.  A bronchoscopic biopsy was unrevealing and a CT-guided biopsy was done, which showed atypical cells.  Given the highly suspicious nature of the lesion, the patient was advised to undergo a wedge resection for diagnosis and possible lobectomy if this turned out to be a lung primary.  The indications, risks, benefits, and alternatives were discussed in detail with the patient.  He understood and accepted the risks and agreed  to proceed.  OPERATIVE NOTE:  Timothy Guerrero was brought to the preoperative holding area on December 17, 2014. Anesthesia placed a central line and an arterial blood pressure monitoring line.  He was taken to the operating room, anesthetized, and intubated.  Intravenous antibiotics were administered. A Foley catheter was placed.  Sequential compression devices were placed on the calves for DVT prophylaxis.  He was placed in a left lateral decubitus position and the right chest was prepped and draped in usual sterile fashion.  Single lung ventilation of the left lung was initiated and was tolerated well throughout the procedure.  An incision made in the seventh intercostal space in the midaxillary Line. It was carried through the skin and subcutaneous tissue.  The chest was entered using a 5 mm port and the thoracoscope was advanced into the chest.  Inspection revealed no pleural fluid, but there was extensive plaquing on the parietal pleura which had a nodular appearance.  This was a worrisome appearance and the decision made to biopsy the plaque prior to proceeding with any lung resection.  A small working incision 5 cm in length was made in the fifth interspace anterolaterally.  No rib spreading was performed during the procedure.  A representative parietal pleural plaque was identified and was removed and sent for frozen section.  While awaiting the results of the frozen, a general inspection of the chest was made.  There was a relatively large, but otherwise benign-appearing lymph node in the paraesophageal (level 8) region.  The pleura overlying this was incised and the node was removed and sent for frozen section as well.  Frozen section subsequently returned showing benign fibrotic reaction and calcification in the pleural plaque and the lymph node was benign as well.  The decision was made to proceed with wedge resection.  The inferior ligament was divided with electrocautery up to  the level of the inferior pulmonary vein.  The pleura was divided at the hilum posteriorly.  The mass was palpable in the superior segment.  The major fissure was obliterated in a small area.  It was unclear if there was definite invasion of the right upper lobe, but this could not be ruled out.  The decision was made to take a small cuff of the right upper lobe en bloc with the mass to ensure an adequate margin.  The major fissure then was dissected out.  It was relatively complete anteriorly.  The basilar segmental branches of the lower lobe were easily visible.  The pleura overlying these was divided more laterally between the superior segment and the upper lobe.  There was an incomplete fissure.  The superior segmental branch of the pulmonary artery as well as the posterior ascending branch to the upper lobe were identified and preserved.  The staple lines were begun in the fissure here, taking a portion of the right upper lobe.  These were done with sequential firings of the endoscopic stapler using gold cartridges, more laterally the lymph nodes were dissected off the bronchus intermedius in this area and the upper lobe bronchus to ensure the bronchus was not captured in the staple line.  After completing this staple line and freeing up the mass superiorly, it was determined that a wedge resection would compromise the superior segmental artery and possibly the bronchus as well.  The decision was made to proceed with the superior segmentectomy.  The superior segmental arterial branches were dissected out.  There were 2 branches  The more antrior branch was larger, it was dissected out and encircled and divided with an endoscopic vascular stapler.  The second branch likewise was divided with the endoscopic vascular stapler.  The segmentectomy was completed with sequential firings of the endoscopic Stapler again using the gold cartridges. The specimen was placed into an endoscopic  retrieval bag and removed through the working incision and sent for frozen section.  The margin appeared relatively close.  A second port incision was made anterior to the first for additional instrumentation.  While awaiting the results of the frozen section, additional lymph nodes were removed, including the right paratracheal nodes and a level 10 hilar node, these all grossly appeared benign.  The frozen section subsequently returned showing non-small cell carcinoma, probable squamous cell carcinoma.  It was definitely not metastatic melanoma. There was a tumor at the closest margin, as suspected.  The decision was made to proceed with lobectomy as discussed with the patient preoperatively.  The basilar segmental branches of the PA were already dissected out from the previous dissection and removal of lymph nodes.  These were encircled and divided with an endoscopic vascular stapler.  The inferior pulmonary vein was nearly completely dissected out.  Dissection was completed and this vessel was also divided with an endoscopic vascular stapler.  The bronchus was now isolated. A stapler, using a  green cartridge, was placed across the bronchus and closed.  A test inflation of the right lung showed good aeration of the upper and middle lobes. The stapler was fired transecting the right lower lobe bronchus.  The right lower lobe was placed into an endoscopic retrieval bag and removed through the incision and sent for frozen section.  The bronchial margin was free of tumor.  It was noted during the procedure that the patient had an azygos lobe of the right upper lobe.  The subcarinal space was relatively unapproachable and no dissection was performed in this area.  The chest was copiously irrigated with warm saline.  A test inflation to 30 cm of water revealed no leakage from the bronchial stump.  An ON-Q local anesthetic catheter was placed through a separate stab incision posteriorly  and tunneled into a subpleural location.  It was primed with 5 mL of 0.5% Marcaine.  A 28-French chest tube was placed through the more anterior port incision and a 28-French Blake drain was placed through the more posterior port incision.  Both were secured at the skin with #1 silk sutures.  The upper and middle lobes were reinflated.  There was good aeration of both.  The incision was closed in 3 layers with a running #1 Vicryl fascial suture, followed by a 2-0 Vicryl subcutaneous suture and a 3-0 Vicryl subcuticular suture.  All sponge, needle, and instrument counts were correct at the end of the procedure.  The patient was extubated in the operating room and taken the postanesthetic care unit in good condition.     Revonda Standard Roxan Hockey, M.D.     SCH/MEDQ  D:  12/17/2014  T:  12/18/2014  Job:  [id number]

## 2014-12-18 NOTE — Discharge Summary (Signed)
Physician Discharge Summary  Patient ID: Timothy Guerrero MRN: [id number] DOB/AGE: Jul 09, 1940 74 y.o.  Admit date: 12/17/2014 Discharge date: 12/18/2014  Admission Diagnoses: Right lower lobe mass  Patient Active Problem List   Diagnosis Date Noted  . S/P lobectomy of lung 12/17/2014  . Prostate cancer 12/08/2014  . Diabetes mellitus 12/08/2014  . Hypertension 12/08/2014  . Lung mass 12/08/2014  . Melanoma of skin, site unspecified 12/31/2013  . History of colonic polyps 12/08/2013   Discharge Diagnoses: Squamous cell carcinoma right lower lobe, stage IA  Patient Active Problem List   Diagnosis Date Noted  . S/P lobectomy of lung 12/17/2014  . Prostate cancer 12/08/2014  . Diabetes mellitus 12/08/2014  . Hypertension 12/08/2014  . Lung mass 12/08/2014  . Melanoma of skin, site unspecified 12/31/2013  . History of colonic polyps 12/08/2013   Discharged Condition: good  History of Present Illness:  [name] is a 74 yo male with known history of tobacco abuse and multiple melanomas.  He underwent a CT scan which showed a right lower lobe mass.  This was measured to be a 3.5 spiculated mass in the right lower lobe.  Further workup with PET CT scan was done and the mass was found to be hypermetabolic.  There was also a a right paratracheal lymph which was mildly hypermetabolic.  He underwent a bronchoscopic biopsy which was unrevealing.  He underwent a CT guided biopsy on 11/06/2014, which showed atypical cells.  He was referred to [facility] and evaluated by Dr. Roxan Hockey on 12/08/2014 at which time he felt he should undergo surgical resection for definitive diagnosis.  The risks and benefits of the procedure were explained to the patient and he was agreeable to proceed.   Hospital Course:   [name] presented to [facility] on 12[facility]17/2014.  He was taken to the operating room and underwent Right Video Assisted Thoracoscopy with RLL Superior Segmentectomy, RLL Lobectomy lymph node  dissection, and pleural biopsy.  He tolerated the procedure without difficulty, was extubated and taken to the SICU in stable condition.  He progressed well post operatively.  His chest tubes did not show evidence of air leak and were transitioned to water seal on POD #1. They were able to be d/c's without difficulty.  He was having issues with hypertension and was restarted on his home blood pressure medications.  He was felt to possibly have an early ileus with bowel distention.  He was placed on reglan and this resolved.  He was felt medically stable for transfer to the step down unit on POD #1. He has had post op atrial flutter and he has been seen by cardiology and started on eliquis and amiodarone. He is otherwise progressing quite well and is stable for discharge.  Treatments: surgery:   Right video-assisted thoracoscopy, pleural biopsy, right lower lobe superior segmentectomy, thoracoscopic right lower lobectomy, mediastinal lymph node dissection, and ON-Q local anesthetic catheter placement.  Disposition: 01-Home or Self Care   Discharge Medications:    Medication List    STOP taking these medications        aspirin 325 MG tablet     ibuprofen 400 MG tablet  Commonly known as:  ADVIL,MOTRIN      TAKE these medications        amiodarone 400 MG tablet  Commonly known as:  PACERONE  Take 1 tablet (400 mg total) by mouth 2 (two) times daily. For 7 days, then once daily     apixaban 5 MG  Tabs tablet  Commonly known as:  ELIQUIS  Take 1 tablet (5 mg total) by mouth 2 (two) times daily.     cholecalciferol 1000 UNITS tablet  Commonly known as:  VITAMIN D  Take 1,000 Units by mouth daily.     cloNIDine 0.3 MG tablet  Commonly known as:  CATAPRES  Take 1 tablet (0.3 mg total) by mouth daily.     glipiZIDE 10 MG tablet  Commonly known as:  GLUCOTROL  Take 10 mg by mouth daily.     hydrochlorothiazide 25 MG tablet  Commonly known as:  HYDRODIURIL  Take 25 mg by mouth daily.      insulin glargine 100 UNIT/ML injection  Commonly known as:  LANTUS  Inject 50 Units into the skin daily. Takes in the morning     lisinopril 40 MG tablet  Commonly known as:  PRINIVIL,ZESTRIL  Take 40 mg by mouth daily.     metFORMIN 1000 MG tablet  Commonly known as:  GLUCOPHAGE  Take 1,000 mg by mouth 2 (two) times daily.     metoprolol succinate 100 MG 24 hr tablet  Commonly known as:  TOPROL-XL  Take 100 mg by mouth daily.     oxyCODONE 5 MG immediate release tablet  Commonly known as:  Oxy IR/ROXICODONE  Take 1-2 tablets (5-10 mg total) by mouth every 4 (four) hours as needed for severe pain.     tiotropium 18 MCG inhalation capsule  Commonly known as:  SPIRIVA  Place 18 mcg into inhaler and inhale daily.       Follow-up Information    Follow up with Melrose Nakayama, MD On 01/05/2015.   Specialty:  Cardiothoracic Surgery   Why:  Appointment is at 12:00   Contact information:   [place] [address] [phone]       Follow up with [facility] On 01/05/2015.   Why:  Appointment is at 11:30   Contact information:   [facility]       Follow up with Minus Breeding, MD.   Specialty:  Cardiology   Why:  the office will contact you regarding an appointment to be seen in the atrial fibrillation clinic in a few weeks   Contact information:   [place] [address] [phone]       Signed: Ellwood Handler 12/18/2014, 11:13 AM

## 2014-12-18 NOTE — Progress Notes (Signed)
1 Day Post-Op Procedure(s) (LRB): RIGHT VIDEO ASSISTED THORACOSCOPY (VATS) (Right) RIGHT LOWER LOBECTOMY (Right) LYMPH NODE DISSECTION (Right) Subjective: Hurts when I cough Denies nausea  Objective: Vital signs in last 24 hours: Temp:  [96.8 F (36 C)-98.5 F (36.9 C)] 97.8 F (36.6 C) (06/17 0400) Pulse Rate:  [69-89] 78 (06/17 0700) Cardiac Rhythm:  [-] Normal sinus rhythm (06/17 0600) Resp:  [9-28] 15 (06/17 0700) BP: (117-257)/(50-105) 171/75 mmHg (06/17 0700) SpO2:  [88 %-100 %] 97 % (06/17 0700) Arterial Line BP: (135-221)/(54-119) 215/81 mmHg (06/17 0700) Weight:  [216 lb 8 oz (98.204 kg)] 216 lb 8 oz (98.204 kg) (06/16 0857)  Hemodynamic parameters for last 24 hours:    Intake/Output from previous day: 06/16 0701 - 06/17 0700 In: 3813.2 [P.O.:240; I.V.:3523.2; IV Piggyback:50] Out: 1953 [TGPQD:8264; Blood:100; Chest Tube:698] Intake/Output this shift:    General appearance: alert and no distress Neurologic: intact Heart: regular rate and rhythm Lungs: diminished breath sounds bibasilar Abdomen: distended, tympanitic, nontender no air leak, serosanguinous output from CT  Lab Results:  Recent Labs  12/15/14 1113 12/18/14 0500  WBC 6.9 10.9*  HGB 14.7 11.9*  HCT 43.0 37.0*  PLT 162 164   BMET:  Recent Labs  12/15/14 1113 12/18/14 0500  NA 139 143  K 4.2 3.6  CL 106 110  CO2 21* 28  GLUCOSE 293* 103*  BUN 15 12  CREATININE 0.93 0.74  CALCIUM 9.6 8.3*    PT/INR:  Recent Labs  12/15/14 1113  LABPROT 13.6  INR 1.02   ABG    Component Value Date/Time   PHART 7.357 12/18/2014 0418   HCO3 26.9* 12/18/2014 0418   TCO2 28 12/18/2014 0418   O2SAT 90.0 12/18/2014 0418   CBG (last 3)   Recent Labs  12/17/14 1946 12/17/14 2325 12/18/14 0417  GLUCAP 266* 205* 100*    Assessment/Plan: S/P Procedure(s) (LRB): RIGHT VIDEO ASSISTED THORACOSCOPY (VATS) (Right) RIGHT LOWER LOBECTOMY (Right) LYMPH NODE DISSECTION (Right) -  POD # 1  RLL  CV- hypertension, continue PO meds, restart lisinopril, labetaolo PRN  RESP_ CXR looks good, no air leak, CT to water seal  RENAL- creatinine OK, keep Foley today  PAIN CONTROL- well controlled with PCA  GI- distended but nontender, probable ileus- reglan  DVT prophylaxis- SCD + enoxaparin  Transfer to stepdown when bed available   LOS: 1 day    Melrose Nakayama 12/18/2014

## 2014-12-19 ENCOUNTER — Inpatient Hospital Stay (HOSPITAL_COMMUNITY): Payer: Commercial Managed Care - HMO

## 2014-12-19 LAB — COMPREHENSIVE METABOLIC PANEL
ALT: 12 U/L — ABNORMAL LOW (ref 17–63)
AST: 14 U/L — ABNORMAL LOW (ref 15–41)
Albumin: 2.4 g/dL — ABNORMAL LOW (ref 3.5–5.0)
Alkaline Phosphatase: 41 U/L (ref 38–126)
Anion gap: 4 — ABNORMAL LOW (ref 5–15)
BUN: 9 mg/dL (ref 6–20)
CO2: 31 mmol/L (ref 22–32)
Calcium: 8.3 mg/dL — ABNORMAL LOW (ref 8.9–10.3)
Chloride: 105 mmol/L (ref 101–111)
Creatinine, Ser: 0.78 mg/dL (ref 0.61–1.24)
GFR calc Af Amer: >60 mL/min (ref >60)
GFR calc non Af Amer: >60 mL/min (ref >60)
Glucose, Bld: 157 mg/dL — ABNORMAL HIGH (ref 65–99)
Potassium: 3.5 mmol/L (ref 3.5–5.1)
Sodium: 140 mmol/L (ref 135–145)
Total Bilirubin: 0.8 mg/dL (ref 0.3–1.2)
Total Protein: 4.9 g/dL — ABNORMAL LOW (ref 6.5–8.1)

## 2014-12-19 LAB — GLUCOSE, CAPILLARY
Glucose-Capillary: 207 mg/dL — ABNORMAL HIGH (ref 65–99)
Glucose-Capillary: 225 mg/dL — ABNORMAL HIGH (ref 65–99)
Glucose-Capillary: 203 mg/dL — ABNORMAL HIGH (ref 65–99)
Glucose-Capillary: 213 mg/dL — ABNORMAL HIGH (ref 65–99)

## 2014-12-19 LAB — CBC
HCT: 35.3 % — ABNORMAL LOW (ref 39.0–52.0)
Hemoglobin: 11.4 g/dL — ABNORMAL LOW (ref 13.0–17.0)
MCH: 29.8 pg (ref 26.0–34.0)
MCHC: 32.3 g/dL (ref 30.0–36.0)
MCV: 92.2 fL (ref 78.0–100.0)
Platelets: 151 10*3/uL (ref 150–400)
RBC: 3.83 MIL/uL — ABNORMAL LOW (ref 4.22–5.81)
RDW: 15.2 % (ref 11.5–15.5)
WBC: 9.3 10*3/uL (ref 4.0–10.5)

## 2014-12-19 MED ORDER — POTASSIUM CHLORIDE CRYS ER 20 MEQ PO TBCR
20.0000 meq | EXTENDED_RELEASE_TABLET | Freq: Once | ORAL | Status: AC
  Administered 2014-12-19: 20 meq via ORAL
  Filled 2014-12-19: qty 1

## 2014-12-19 MED ORDER — METFORMIN HCL 500 MG PO TABS
1000.0000 mg | ORAL_TABLET | Freq: Two times a day (BID) | ORAL | Status: DC
  Administered 2014-12-19 – 2014-12-23 (×8): 1000 mg via ORAL
  Filled 2014-12-19 (×10): qty 2

## 2014-12-19 MED ORDER — POTASSIUM CHLORIDE CRYS ER 20 MEQ PO TBCR: 40.0000 meq | EXTENDED_RELEASE_TABLET | Freq: Once | ORAL | Status: DC

## 2014-12-19 NOTE — Progress Notes (Addendum)
      [address]       [place] [address]             [phone]       2 Days Post-Op Procedure(s) (LRB): RIGHT VIDEO ASSISTED THORACOSCOPY (VATS) (Right) RIGHT LOWER LOBECTOMY (Right) LYMPH NODE DISSECTION (Right)  Subjective: Patient's throat does not hurt as much this am.  Objective: Vital signs in last 24 hours: Temp:  [98.2 F (36.8 C)-98.9 F (37.2 C)] 98.2 F (36.8 C) (06/18 0741) Pulse Rate:  [69-85] 78 (06/18 0722) Cardiac Rhythm:  [-] Normal sinus rhythm (06/18 0800) Resp:  [17-21] 17 (06/18 0722) BP: (135-179)/(67-93) 167/73 mmHg (06/18 0722) SpO2:  [97 %-100 %] 99 % (06/18 0722)     Intake/Output from previous day: 06/17 0701 - 06/18 0700 In: 1700 [P.O.:600; I.V.:900; IV Piggyback:200] Out: 2865 [DDUKG:2542; Chest Tube:410]   Physical Exam:  Cardiovascular: RRR Pulmonary: Diminished at right base, mostly clear on left; no rales, wheezes, or rhonchi. Abdomen: Soft, non tender, bowel sounds present. Extremities: Mild bilateral lower extremity edema. Wounds: Dressing is clean and dry.   Chest Tubes: to water seal, no air leak  Lab Results: CBC: Recent Labs  12/18/14 0500 12/19/14 0420  WBC 10.9* 9.3  HGB 11.9* 11.4*  HCT 37.0* 35.3*  PLT 164 151   BMET:  Recent Labs  12/18/14 0500 12/19/14 0420  NA 143 140  K 3.6 3.5  CL 110 105  CO2 28 31  GLUCOSE 103* 157*  BUN 12 9  CREATININE 0.74 0.78  CALCIUM 8.3* 8.3*    PT/INR: No results for input(s): LABPROT, INR in the last 72 hours. ABG:  INR: Will add last result for INR, ABG once components are confirmed Will add last 4 CBG results once components are confirmed  Assessment/Plan:  1. CV - SR in the 70's. SBP increasing into the 150-160's. On Toprol XL 100 mg daily, Catapres 0.2 mg daily, HCTZ 25 mg daily, and Lisinopril 40 mg daily as taken pre op. 2.  Pulmonary - Chest tubes with 450 cc for last 24 hours? Chest tubes to water seal. CXR this am shows small right  pneumothorax and right base atelectasis. Wait until am to remove one chest tube.Encourage incentive spirometer. 3. H and H stable at 11.4 and 35.3 4. Supplement potassium 5. DM-CBGs 315/223/207. On Insulin PRN. Will restart Metformin as taken pre op.  6. Decrease IVF  ZIMMERMAN,DONIELLE MPA-C 12/19/2014,9:40 AM  patient examined and medical record reviewed,agree with above note.cont chest tubes to water seal Tharon Aquas Trigt III 12/19/2014

## 2014-12-20 ENCOUNTER — Inpatient Hospital Stay (HOSPITAL_COMMUNITY): Payer: Commercial Managed Care - HMO

## 2014-12-20 LAB — GLUCOSE, CAPILLARY
Glucose-Capillary: 212 mg/dL — ABNORMAL HIGH (ref 65–99)
Glucose-Capillary: 235 mg/dL — ABNORMAL HIGH (ref 65–99)
Glucose-Capillary: 276 mg/dL — ABNORMAL HIGH (ref 65–99)
Glucose-Capillary: 242 mg/dL — ABNORMAL HIGH (ref 65–99)

## 2014-12-20 MED ORDER — INSULIN GLARGINE 100 UNIT/ML ~~LOC~~ SOLN
10.0000 [IU] | Freq: Every day | SUBCUTANEOUS | Status: DC
  Administered 2014-12-20: 10 [IU] via SUBCUTANEOUS
  Filled 2014-12-20 (×2): qty 0.1

## 2014-12-20 NOTE — Progress Notes (Addendum)
      [address]       [place] [address]             [phone]       3 Days Post-Op Procedure(s) (LRB): RIGHT VIDEO ASSISTED THORACOSCOPY (VATS) (Right) RIGHT LOWER LOBECTOMY (Right) LYMPH NODE DISSECTION (Right)  Subjective: Patient feels fairly well this am.  Objective: Vital signs in last 24 hours: Temp:  [98.2 F (36.8 C)-98.8 F (37.1 C)] 98.2 F (36.8 C) (06/19 0737) Pulse Rate:  [66-81] 73 (06/19 0352) Cardiac Rhythm:  [-] Normal sinus rhythm (06/19 0735) Resp:  [19-22] 20 (06/19 0352) BP: (130-169)/(65-79) 169/66 mmHg (06/19 0352) SpO2:  [97 %-100 %] 97 % (06/19 0759)     Intake/Output from previous day: 06/18 0701 - 06/19 0700 In: 9485 [P.O.:960; I.V.:334] Out: 1510 [Urine:1200; Chest Tube:310]   Physical Exam:  Cardiovascular: RRR Pulmonary: Coarse with rub on right,clear on left Abdomen: Soft, non tender, bowel sounds present. Extremities: Mild bilateral lower extremity edema. Wounds: Dressing is clean and dry.   Chest Tubes: to water seal, no air leak  Lab Results: CBC:  Recent Labs  12/18/14 0500 12/19/14 0420  WBC 10.9* 9.3  HGB 11.9* 11.4*  HCT 37.0* 35.3*  PLT 164 151   BMET:   Recent Labs  12/18/14 0500 12/19/14 0420  NA 143 140  K 3.6 3.5  CL 110 105  CO2 28 31  GLUCOSE 103* 157*  BUN 12 9  CREATININE 0.74 0.78  CALCIUM 8.3* 8.3*    PT/INR: No results for input(s): LABPROT, INR in the last 72 hours. ABG:  INR: Will add last result for INR, ABG once components are confirmed Will add last 4 CBG results once components are confirmed  Assessment/Plan:  1. CV - SR in the 70's. SBP mostly in the 150-160's. On Toprol XL 100 mg daily, Catapres 0.2 mg daily, HCTZ 25 mg daily, and Lisinopril 40 mg daily as taken pre op. 2.  Pulmonary - Chest tubes with 310 cc for last 24 hours. Chest tubes to water seal. CXR this am shows small right pneumothorax and bibasilar atelectasis. Per Dr. Prescott Gum, remove  anterior chest tube today.Encourage incentive spirometer. 3. Last H and H stable at 11.4 and 35.3 5. DM-CBGs 203/213/212. On Metformin. Will increase Insulin for better glucose control. 6. Remove On Q  ZIMMERMAN,DONIELLE MPA-C 12/20/2014,10:09 AM  CXR clear- will get ant CT out and leave post tube for persistent drainage patient examined and medical record reviewed,agree with above note. Tharon Aquas Trigt III 12/20/2014

## 2014-12-21 ENCOUNTER — Inpatient Hospital Stay (HOSPITAL_COMMUNITY): Payer: Commercial Managed Care - HMO

## 2014-12-21 LAB — GLUCOSE, CAPILLARY
Glucose-Capillary: 239 mg/dL — ABNORMAL HIGH (ref 65–99)
Glucose-Capillary: 207 mg/dL — ABNORMAL HIGH (ref 65–99)
Glucose-Capillary: 221 mg/dL — ABNORMAL HIGH (ref 65–99)
Glucose-Capillary: 170 mg/dL — ABNORMAL HIGH (ref 65–99)

## 2014-12-21 MED ORDER — INSULIN GLARGINE 100 UNIT/ML ~~LOC~~ SOLN
50.0000 [IU] | Freq: Every day | SUBCUTANEOUS | Status: DC
  Administered 2014-12-21 – 2014-12-23 (×3): 50 [IU] via SUBCUTANEOUS
  Filled 2014-12-21 (×3): qty 0.5

## 2014-12-21 MED ORDER — GLIPIZIDE 10 MG PO TABS
10.0000 mg | ORAL_TABLET | Freq: Every day | ORAL | Status: DC
  Administered 2014-12-21 – 2014-12-23 (×3): 10 mg via ORAL
  Filled 2014-12-21 (×5): qty 1

## 2014-12-21 MED ORDER — METOPROLOL TARTRATE 1 MG/ML IV SOLN
5.0000 mg | Freq: Once | INTRAVENOUS | Status: AC
  Administered 2014-12-21: 5 mg via INTRAVENOUS
  Filled 2014-12-21: qty 5

## 2014-12-21 MED ORDER — LEVALBUTEROL HCL 0.63 MG/3ML IN NEBU: 0.6300 mg | INHALATION_SOLUTION | Freq: Three times a day (TID) | RESPIRATORY_TRACT | Status: DC | PRN

## 2014-12-21 NOTE — Care Management Note (Signed)
Case Management Note  Patient Details  Name: Timothy Guerrero MRN: [id number] Date of Birth: [birth year]  Subjective/Objective:                 Pt from home alone s/p  VAT's/ RLL lobectomy.   Action/Plan: Return to home when medically stable.CM to f/u with d/c disposition.  Expected Discharge Date:                  Expected Discharge Plan:  Home/Self Care  In-House Referral:     Discharge planning Services  CM Consult  Post Acute Care Choice:    Choice offered to:     DME Arranged:    DME Agency:     HH Arranged:    HH Agency:     Status of Service:  In process, will continue to follow  Medicare Important Message Given:    Date Medicare IM Given:    Medicare IM give by:    Date Additional Medicare IM Given:    Additional Medicare Important Message give by:     If discussed at [place] of Stay Meetings, dates discussed:    Additional Comments: Pt states will live with girlfriend([name]), [phone] , once d/c for one week then home alone. Whitman Hero [place], RN 12/21/2014, 3:24 PM

## 2014-12-21 NOTE — Progress Notes (Addendum)
430 mcg IV fentanyl wasted in sink with Pricilla Holm RN.  Verified.

## 2014-12-21 NOTE — Progress Notes (Signed)
Report called to Santiago Glad, Therapist, sports, all questions answered.  No complaints. Patient aware.

## 2014-12-21 NOTE — Progress Notes (Signed)
Utilization review completed.  

## 2014-12-21 NOTE — Progress Notes (Signed)
Had burst of SVT this am 0735, tolerated without event, now SR with PAC's.  Dr. Skipper Cliche aware and ordered metoprolol po given now.  Had another burst of SVT when ambulating, HR 150's.  Back to bed now SR with frequent PAC's.  Erin Barrett, notified and 5 mg of metoprolol ordered.  Patient without complaints.  Chest tube and rt. Central line removed intact with out event, tolerated.  Continue to watch.

## 2014-12-21 NOTE — Progress Notes (Addendum)
Medicare Important Message given? YES (If response is "NO", the following Medicare IM given date fields will be blank) Date Medicare IM given:12/21/2014 Medicare IM given by: Whitman Hero

## 2014-12-21 NOTE — Progress Notes (Signed)
4 Days Post-Op Procedure(s) (LRB): RIGHT VIDEO ASSISTED THORACOSCOPY (VATS) (Right) RIGHT LOWER LOBECTOMY (Right) LYMPH NODE DISSECTION (Right) Subjective: No complaints this AM  Objective: Vital signs in last 24 hours: Temp:  [98.3 F (36.8 C)-99.8 F (37.7 C)] 99.8 F (37.7 C) (06/20 0716) Pulse Rate:  [74-96] 74 (06/19 2310) Cardiac Rhythm:  [-] Normal sinus rhythm (06/20 0426) Resp:  [19-27] 27 (06/20 0733) BP: (118-171)/(56-84) 171/84 mmHg (06/20 0426) SpO2:  [95 %-97 %] 97 % (06/20 0733)  Hemodynamic parameters for last 24 hours:    Intake/Output from previous day: 06/19 0701 - 06/20 0700 In: 1210 [P.O.:1080; I.V.:130] Out: 1350 [Urine:1000; Chest Tube:350] Intake/Output this shift:    General appearance: alert and no distress Neurologic: intact Heart: slightly irregular Lungs: diminished breath sounds right base Wound: clean and dry no air leak  Lab Results:  Recent Labs  12/19/14 0420  WBC 9.3  HGB 11.4*  HCT 35.3*  PLT 151   BMET:  Recent Labs  12/19/14 0420  NA 140  K 3.5  CL 105  CO2 31  GLUCOSE 157*  BUN 9  CREATININE 0.78  CALCIUM 8.3*    PT/INR: No results for input(s): LABPROT, INR in the last 72 hours. ABG    Component Value Date/Time   PHART 7.357 12/18/2014 0418   HCO3 26.9* 12/18/2014 0418   TCO2 28 12/18/2014 0418   O2SAT 90.0 12/18/2014 0418   CBG (last 3)   Recent Labs  12/20/14 1611 12/20/14 2114 12/21/14 0714  GLUCAP 276* 242* 239*    Assessment/Plan: S/P Procedure(s) (LRB): RIGHT VIDEO ASSISTED THORACOSCOPY (VATS) (Right) RIGHT LOWER LOBECTOMY (Right) LYMPH NODE DISSECTION (Right) -  Doing well CBG elevated- increase lantus to home dose DC chest tube Dc PCA  DC central line Continue IS Transfer to 2W Possibly home tomorrow Path still pending   LOS: 4 days    Timothy Guerrero 12/21/2014

## 2014-12-22 ENCOUNTER — Inpatient Hospital Stay (HOSPITAL_COMMUNITY): Payer: Commercial Managed Care - HMO

## 2014-12-22 LAB — GLUCOSE, CAPILLARY
Glucose-Capillary: 156 mg/dL — ABNORMAL HIGH (ref 65–99)
Glucose-Capillary: 165 mg/dL — ABNORMAL HIGH (ref 65–99)
Glucose-Capillary: 81 mg/dL (ref 65–99)
Glucose-Capillary: 142 mg/dL — ABNORMAL HIGH (ref 65–99)

## 2014-12-22 MED ORDER — AMIODARONE HCL 200 MG PO TABS
400.0000 mg | ORAL_TABLET | Freq: Two times a day (BID) | ORAL | Status: DC
  Administered 2014-12-22 – 2014-12-23 (×3): 400 mg via ORAL
  Filled 2014-12-22 (×4): qty 2

## 2014-12-22 NOTE — Progress Notes (Addendum)
[address]       [place] [address]             [phone]      5 Days Post-Op Procedure(s) (LRB): RIGHT VIDEO ASSISTED THORACOSCOPY (VATS) (Right) RIGHT LOWER LOBECTOMY (Right) LYMPH NODE DISSECTION (Right) Subjective: Having paroxysms of SVT/Afib- has had palps  in past but no specific H/O Afib Overall feels pretty well Objective: Vital signs in last 24 hours: Temp:  [97.9 F (36.6 C)-98.3 F (36.8 C)] 97.9 F (36.6 C) (06/21 0520) Pulse Rate:  [74-82] 77 (06/21 0520) Cardiac Rhythm:  [-] Normal sinus rhythm (06/20 1945) Resp:  [19-21] 20 (06/20 2057) BP: (135-172)/(49-80) 164/69 mmHg (06/21 0650) SpO2:  [97 %-100 %] 97 % (06/21 0520)  Hemodynamic parameters for last 24 hours:    Intake/Output from previous day: 06/20 0701 - 06/21 0700 In: 620 [P.O.:560; I.V.:60] Out: 1476 [Urine:1225; Stool:1; Chest Tube:250] Intake/Output this shift:    General appearance: alert, cooperative and no distress Heart: slightly irregular Lungs: clear to auscultation bilaterally Abdomen: mild distension, soft, nontender Extremities: no edema Wound: incis healing well  Lab Results: No results for input(s): WBC, HGB, HCT, PLT in the last 72 hours. BMET: No results for input(s): NA, K, CL, CO2, GLUCOSE, BUN, CREATININE, CALCIUM in the last 72 hours.  PT/INR: No results for input(s): LABPROT, INR in the last 72 hours. ABG    Component Value Date/Time   PHART 7.357 12/18/2014 0418   HCO3 26.9* 12/18/2014 0418   TCO2 28 12/18/2014 0418   O2SAT 90.0 12/18/2014 0418   CBG (last 3)   Recent Labs  12/21/14 1656 12/21/14 2053 12/22/14 0611  GLUCAP 221* 170* 156*    Meds Scheduled Meds: . acetaminophen  1,000 mg Oral 4 times per day   Or  . acetaminophen (TYLENOL) oral liquid 160 mg/5 mL  1,000 mg Oral 4 times per day  . antiseptic oral rinse  7 mL Mouth Rinse BID  . aspirin  325 mg Oral Daily  . bisacodyl  10 mg Oral Daily  . cloNIDine  0.2 mg  Oral Daily  . enoxaparin (LOVENOX) injection  40 mg Subcutaneous Q24H  . glipiZIDE  10 mg Oral QAC breakfast  . hydrochlorothiazide  25 mg Oral Daily  . insulin aspart  0-15 Units Subcutaneous TID WC  . insulin aspart  0-5 Units Subcutaneous QHS  . insulin glargine  50 Units Subcutaneous Daily  . lisinopril  40 mg Oral Daily  . metFORMIN  1,000 mg Oral BID WC  . metoprolol succinate  100 mg Oral Daily  . pantoprazole  40 mg Oral Daily  . senna-docusate  1 tablet Oral QHS  . tiotropium  18 mcg Inhalation Daily   Continuous Infusions: . sodium chloride 10 mL/hr at 12/20/14 2000   PRN Meds:.diphenhydrAMINE **OR** diphenhydrAMINE, labetalol, levalbuterol, naloxone **AND** sodium chloride, ondansetron (ZOFRAN) IV, oxyCODONE, potassium chloride, traMADol  Xrays Dg Chest 2 View  12/22/2014   CLINICAL DATA:  Right upper lobectomy.  EXAM: CHEST  2 VIEW  COMPARISON:  12/21/2014.  FINDINGS: Interim removal right IJ line. Interval removal of right chest tube. Mediastinum hilar structures stable. Heart size normal. Stable right apical pneumothorax. Stable bibasilar subsegmental atelectasis. Tiny calcified pulmonary nodules noted most likely from prior granulomas disease. Small right pleural effusion. Left shoulder replacement.  IMPRESSION: 1. Interim removal of right IJ line and right chest tube. 2. Stable small right apical pneumothorax. 3. Mild bibasilar subsegmental atelectasis. Small right  pleural effusion.   Electronically Signed   By: Marcello Moores  Register   On: 12/22/2014 07:38   Dg Chest 1v Repeat Same Day  12/21/2014   CLINICAL DATA:  Post chest tube removal  EXAM: CHEST - 1 VIEW SAME DAY  COMPARISON:  Prior film same day  FINDINGS: Cardiomediastinal silhouette is stable. Right lower chest tube has been removed. Stable small right apical pneumothorax. No acute infiltrate or pulmonary edema. Mild right basilar atelectasis. Left lung is clear. Right IJ central line has been removed.  IMPRESSION: Right  lower chest tube has been removed. Stable small right apical pneumothorax. No infiltrate or pulmonary edema. Small right basilar atelectasis.   Electronically Signed   By: Lahoma Crocker M.D.   On: 12/21/2014 11:46   Dg Chest Port 1 View  12/21/2014   CLINICAL DATA:  Pneumothorax.  EXAM: PORTABLE CHEST - 1 VIEW  COMPARISON:  12/20/2014 .  FINDINGS: Interim removal of bilateral most chest tube. Medial most chest tube in stable position. Right IJ line stable position. Stable small right pneumothorax. Postsurgical changes right lung. Low lung volumes with basilar atelectasis. Stable cardiomegaly with normal pulmonary vascularity. Left shoulder replacement.  IMPRESSION: 1. Interim removal of lateral most chest tube. Remaining medial most chest tube is in stable position. Stable small right apical pneumothorax. 2. Postsurgical changes right lung. Persistent bibasilar atelectasis. 3. Stable cardiomegaly.  No pulmonary venous congestion.   Electronically Signed   By: Marcello Moores  Register   On: 12/21/2014 07:46   pppppp DIAGNOSIS Diagnosis 1. Pleura, biopsy, right - FIBROSIS AND DYSTROPHIC CALCIFICATIONS. - THERE IS NO EVIDENCE OF MALIGNANCY. 2. Lymph node, biopsy, 8 - THERE IS NO EVIDENCE OF CARCINOMA IN 1 OF 1 LYMPH NODE (0/1). 3. Lung, wedge biopsy/resection, right lower lobe superior segment - INVASIVE SQUAMOUS CELL CARCINOMA, POORLY DIFFERENTIATED, SPANNING 2.8 CM. - CARCINOMA IS PRESENT AT THE SURGICAL RESECTION MARGIN OF SPECIMEN #3. - SEE ONCOLOGY TABLE BELOW. 4. Lung, resection (segmental or lobe), right lower lobe - BENIGN LUNG PARENCHYMA WITH HEMORRHAGE AND SCATTERED FOCI OF ANTHRACOSIS. - THERE IS NO EVIDENCE OF CARCINOMA IN ONE OF LYMPH NODE (0/1). - THERE IS NO EVIDENCE OF MALIGNANCY. 5. Pleura, biopsy, right #2 - FIBROSIS AND DYSTROPHIC CALCIFICATIONS. - THERE IS NO EVIDENCE OF MALIGNANCY. 6. Lymph node, biopsy, 12R - THERE IS NO EVIDENCE OF CARCINOMA IN 1 OF 1 LYMPH NODE (0/1) 7. Lymph  node, biopsy, 12R #2 - THERE IS NO EVIDENCE OF CARCINOMA IN 1 OF 1 LYMPH NODE (0/1) 8. Lymph node, biopsy, 11R - THERE IS NO EVIDENCE OF CARCINOMA IN 1 OF 1 LYMPH NODE (0/1) 9. Lymph node, biopsy, 12R #3 - THERE IS NO EVIDENCE OF CARCINOMA IN 1 OF 1 LYMPH NODE (0/1) 10. Lymph node, biopsy, 12R #4 - THERE IS NO EVIDENCE OF CARCINOMA IN 1 OF 1 LYMPH NODE (0/1) 11. Lymph node, biopsy, 12R #5 - THERE IS NO EVIDENCE OF CARCINOMA IN 1 OF 1 LYMPH NODE (0/1) 12. Lymph node, biopsy, 4R 1 of 4 Duplicate copy FINAL for GENTLE, HOGE ([id number]) Diagnosis(continued) - THERE IS NO EVIDENCE OF CARCINOMA IN 1 OF 1 LYMPH NODE (0/1) 13. Lymph node, biopsy, 10R - THERE IS NO EVIDENCE OF CARCINOMA IN 1 OF 1 LYMPH NODE (0/1) 14. Lymph node, biopsy, 4R #2 - THERE IS NO EVIDENCE OF CARCINOMA IN 1 OF 1 LYMPH NODE (0/1) 15. Lymph node, biopsy, 4R #3 - THERE IS NO EVIDENCE OF CARCINOMA IN 1 OF 1 LYMPH NODE (0/1) Microscopic Comment  3. LUNG Specimen, including laterality: Right lower lobe Procedure: Complete lobectomy Specimen integrity (intact/disrupted): Intact (with staples) Tumor site: Right lower lobe, subpleural Tumor focality: Unifocal Maximum tumor size (cm): 2.8 cm (gross measurement) Histologic type: Squamous cell carcinoma Grade: High grade (poorly differentiated) Margins: Negative for carcinoma Distance to closest margin (cm): At least 4.0 cm to margins (gross measurement) Visceral pleura invasion: Not identified Tumor extension: Confined to lung parenchyma Treatment effect (if treated with neoadjuvant therapy): N/A Lymph -Vascular invasion: Not identified Lymph nodes: Number examined - 12; Number N1 nodes positive 0; Number N2 nodes positive 0 TNM code: pT1b, pNX Ancillary studies: Can be performed upon clinician request Non-neoplastic lung: Foci of acute inflammation with necrosis, anthracosis Enid Cutter MD Pathologist, Electronic Signature (Case signed 12/21/2014) Intraoperative  Diagnosis 1. RIGHT LUNG PLEURAL BIOPSY, FROZEN SECTION: FIBROSIS AND CALCIFICATIONS. (JBK) 2. LYMPH NODE, LEVEL 8, FROZEN SECTION: ANTHRACOSIS. (JBK) 3. LUNG, RIGHT LOWER LOBE SUPERIOR SEGMENT, MARGIN, FROZEN SECTION: MALIGNANT (JBK) 4. LUNG, RIGHT LOBE RESECTION, BRONCHIAL MARGIN, FROZEN SECTION: NEGATIVE FOR MALIGNANCY. (JBK) Specimen Gross and Clinical Information Specimen(s) Obtained: 1. Pleura, biopsy, right 2. Lymph node, biopsy, 8 2 of 4 Duplicate copy FINAL for BECK, COFER ([id number]) Specimen(s) Obtained:(continued) 3. Lung, wedge biopsy/resection, right lower lobe superior segment 4. Lung, resection (segmental or lobe), right lower lobe 5. Pleura, biopsy, right #2 6. Lymph node, biopsy, 12R 7. Lymph node, biopsy, 12R #2 8. Lymph node, biopsy, 11R 9. Lymph node, biopsy, 12R #3 10. Lymph node, biopsy, 12R #4 11. Lymph node, biopsy, 12R #5 12. Lymph node, biopsy, 4R 13. Lymph node, biopsy, 10R 14. Lymph node, biopsy, 4R #2 15. Lymph node, biopsy, 4R #3 Specimen Clinical Information 1. right lower lobe lung mass (cm) Gross 1. Received fresh is a 2.4 cm in diameter and up to 0.2 cm thick gray white to vaguely yellow fibrous-like tissue with scattered nodularity and gritty cut surfaces. Representative sections are submitted in block A for frozen section, with remaining specimen submitted in block B for routine histology. 2. Received fresh is a 1.1 x 1 x 0.3 cm aggregate of tan to anthracotic soft tissue, submitted in one block for frozen section. 3. Received fresh is a 48 gram, 9 x 5 x 4 cm wedge of lung, clinically right lower lobe superior segment. The pleura is tan pink to red purple with scattered anthracosis, and few scattered adhesions. There are are three stapled areas, one of which has a suture and is clinically identified as closest margin. Found just beneath this stapled margin, and grossly involved with the staple line is a 2.8 x 2.6 x 2.3 cm tan white to  anthracotic firm ill defined mass. Separate from the mass is a second area of tan red vaguely nodular and indurated parenchyma, 1.4 x 1.1 x 0.9 cm. Tissue at the staple line clinically identified as closest margin is submitted for frozen section in block A. Additional sections for routine histology are submitted as follows: B= mass. C= mass with overlying pleura. D= mass and and adjacent parenchyma. E= sections of nodular area of parenchyma separate from mass. Total 5 blocks. 4. Specimen: Right lower lobectomy. Specimen integrity (intact/incised/disrupted): There are staple lines over the superior segment. Size, weight: 198 grams, 11 x 9.6 x 4 cm. Pleura: Tan pink to hyperemic with moderate scattered anthracosis. Lesion: No residual lesion or mass is identified. Margin(s): Grossly free of lesions. Hilar vessels: Unremarkable. Nonneoplastic parenchyma: Tan red to dark red, spongy. Lymph nodes, level 12: At the hilum is  a 0.8 cm soft anthracotic lymph node. Block Summary: A shave of the bronchial margin is submitted in block A for frozen section. Additional sections for routine histology are submitted as follows: B= vascular margins. C-E= tissue adjacent to stapled areas over superior segment. F= random parenchyma. G= one node, bisected. 3 of 4 Duplicate copy FINAL for MAXIMUM, REILAND ([id number]) Gross(continued) Total 7 blocks. 5. Received fresh are three irregular pieces of tan pink to yellow, soft to firm fibromembranous and fibrous-like tissues ranging from 1 x 0.6 x 0.2 cm to 3.1 x 2.5 x 0.5 cm. The firm areas have nodular tissues with gritty cut surfaces. Representative sections in one block. 6. Received fresh labeled 12R node, is a 1 cm soft to rubbery anthracotic node, bisected, one block. 7. Received fresh labeled 12R node #2, is a 0.6 cm soft anthracotic node, bisected, one block. 8. Received fresh labeled 11R node, is a 1.7 x 1 x 0.3 cm aggregate of anthracotic, soft to  rubbery tissue, submitted in one block. 9. Received fresh labeled 12R node #3 is a 0.9 cm soft rubbery, anthracotic node which is sectioned and submitted in one block. 10. Received in fresh labeled 12R node #4 is a 1 x 0.8 x 0.3 cm aggregate of anthracotic soft to rubbery tissue, submitted in one block. 11. Received fresh labeled 12R node #5, is a 0.6 x 0.4 x 0.2 cm aggregate of anthracotic soft tissue, submitted in one block. 12. Received fresh labeled labeled 4R node, is a 0.9 cm gray white to anthracotic soft to rubbery node, bisected, one block. 13. Received fresh labeled 10R node, is a 1 x 0.9 x 0.3 cm tan yellow to anthracotic soft tissue in toto, one block. 14. Received fresh labeled 4R node# 2 is a 1.6 x 1.4 x 0.3 cm aggregate of tan yellow to anthracotic soft rubbery tissue, entirely submitted in one block. 15. Received fresh labeled 4R node #3 is a 1 cm soft rubbery, anthracotic node, bisected, one block. (SW:gt, 12/21/14) Report signed out from the following location(s) Technical component and interpretation was performed at [facility], [place], [place] [address]. CLIA #: [id number], 4 of Assessment/Plan: S/P Procedure(s) (LRB): RIGHT VIDEO ASSISTED THORACOSCOPY (VATS) (Right) RIGHT LOWER LOBECTOMY (Right) LYMPH NODE DISSECTION (Right)  1 Dysrhythmias- currently on beta blocker Toprol XL 100 daily, may need to consider amio of other agents. 2 hypertension- will increase clonidine to 0.3 3 path noted 4 cont rehab/pulm toilet 5 pntx/space is stable   LOS: 5 days    GOLD,WAYNE E 12/22/2014  Patient seen and examined, agree with above Discussed pathology results- stage IA Will add amiodarone PO for SVT  Kymani Laursen C. Roxan Hockey, MD [facility] [phone]

## 2014-12-22 NOTE — Care Management Note (Signed)
Case Management Note CM note started by Whitman Hero RNCM  Patient Details  Name: Timothy Guerrero MRN: [id number] Date of Birth: 11/01/1940  Subjective/Objective:                 Pt from home alone s/p  VAT's/ RLL lobectomy.   Action/Plan: Return to home when medically stable.CM to f/u with d/c disposition.  Expected Discharge Date:                  Expected Discharge Plan:  Home/Self Care  In-House Referral:     Discharge planning Services  CM Consult  Post Acute Care Choice:    Choice offered to:     DME Arranged:    DME Agency:     HH Arranged:    HH Agency:     Status of Service:  In process, will continue to follow  Medicare Important Message Given:  Yes Date Medicare IM Given:  12/22/14 Medicare IM give by:  Marvetta Gibbons RN, BSN  Date Additional Medicare IM Given:    Additional Medicare Important Message give by:     If discussed at [place] of Stay Meetings, dates discussed:    Additional Comments: Pt states will live with girlfriend(Timothy Guerrero), [phone] , once d/c for one week then home alone.  Dawayne Patricia, RN 12/22/2014, 10:35 AM

## 2014-12-23 ENCOUNTER — Encounter (HOSPITAL_COMMUNITY): Payer: Self-pay | Admitting: Cardiology

## 2014-12-23 DIAGNOSIS — I483 Typical atrial flutter: Secondary | ICD-10-CM

## 2014-12-23 DIAGNOSIS — I4892 Unspecified atrial flutter: Secondary | ICD-10-CM | POA: Diagnosis present

## 2014-12-23 DIAGNOSIS — I2510 Atherosclerotic heart disease of native coronary artery without angina pectoris: Secondary | ICD-10-CM

## 2014-12-23 LAB — GLUCOSE, CAPILLARY: Glucose-Capillary: 126 mg/dL — ABNORMAL HIGH (ref 65–99)

## 2014-12-23 MED ORDER — OXYCODONE HCL 5 MG PO TABS: 5.0000 mg | ORAL_TABLET | ORAL | Status: DC | PRN

## 2014-12-23 MED ORDER — AMIODARONE HCL 400 MG PO TABS: 400.0000 mg | ORAL_TABLET | Freq: Two times a day (BID) | ORAL | Status: DC

## 2014-12-23 MED ORDER — APIXABAN 5 MG PO TABS: 5.0000 mg | ORAL_TABLET | Freq: Two times a day (BID) | ORAL | Status: DC

## 2014-12-23 MED ORDER — CLONIDINE HCL 0.3 MG PO TABS
0.3000 mg | ORAL_TABLET | Freq: Every day | ORAL | Status: DC
  Administered 2014-12-23: 0.3 mg via ORAL
  Filled 2014-12-23: qty 1

## 2014-12-23 MED ORDER — CLONIDINE HCL 0.3 MG PO TABS: 0.3000 mg | ORAL_TABLET | Freq: Every day | ORAL | Status: DC

## 2014-12-23 NOTE — Discharge Instructions (Signed)
Video-Assisted Thoracic Surgery Care After Refer to this sheet in the next few weeks. These instructions provide you with information on caring for yourself after your procedure. Your caregiver may also give you more specific instructions. Your procedure has been planned according to current medical practices, but problems sometimes occur. Call your caregiver if you have any problems or questions after your procedure. HOME CARE INSTRUCTIONS   Only take over-the-counter or prescription medications as directed.  Only take pain medications (narcotics) as directed.  Do not drive until your caregiver approves. Driving while taking narcotics or soon after surgery can be dangerous, so discuss the specific timing with your caregiver.  Avoid activities that use your chest muscles, such as lifting heavy objects, for at least 3-4 weeks.   Take deep breaths to expand the lungs and to protect against pneumonia.  Do breathing exercises as directed by your caregiver. If you were given an incentive spirometer to help with breathing, use it as directed.  You may resume a normal diet and activities when you feel you are able to or as directed.  Do not take a bath until your caregiver says it is OK. Use the shower instead.   Keep the bandage (dressing) covering the area where the chest tube was inserted (incision site) dry for 48 hours. After 48 hours, remove the dressing unless there is new drainage.  Remove dressings as directed by your caregiver.  Change dressings if necessary or as directed.  Keep all follow-up appointments. It is important for you to see your caregiver after surgery to discuss appropriate follow-up care and surveillance, if it is necessary. SEEK MEDICAL CARE:  You feel excessive or increasing pain at an incision site.  You notice bleeding, skin irritation, drainage, swelling, or redness at an incision site.  There is a bad smell coming from an incision or dressing.  It feels  like your heart is fluttering or beating rapidly.  Your pain medication does not relieve your pain. SEEK IMMEDIATE MEDICAL CARE IF:   You have a fever.   You have chest pain.  You have a rash.  You have shortness of breath.  You have trouble breathing.   You feel weak, lightheaded, dizzy, or faint.  MAKE SURE YOU:   Understand these instructions.   Will watch your condition.   Will get help right away if you are not doing well or get worse. Document Released: 10/14/2012 Document Reviewed: 10/14/2012 [facility] Patient Information 2015 [place]. This information is not intended to replace advice given to you by your health care provider. Make sure you discuss any questions you have with your health care provider. Apixaban oral tablets What is this medicine? APIXABAN (a PIX a ban) is an anticoagulant (blood thinner). It is used to lower the chance of stroke in people with a medical condition called atrial fibrillation. It is also used to treat or prevent blood clots in the lungs or in the veins. This medicine may be used for other purposes; ask your health care provider or pharmacist if you have questions. COMMON BRAND NAME(S): Eliquis What should I tell my health care provider before I take this medicine? They need to know if you have any of these conditions: -bleeding disorders -bleeding in the brain -blood in your stools (black or tarry stools) or if you have blood in your vomit -history of stomach bleeding -kidney disease -liver disease -mechanical heart valve -an unusual or allergic reaction to apixaban, other medicines, foods, dyes, or preservatives -pregnant or  trying to get pregnant -breast-feeding How should I use this medicine? Take this medicine by mouth with a glass of water. Follow the directions on the prescription label. You can take it with or without food. If it upsets your stomach, take it with food. Take your medicine at regular intervals. Do not  take it more often than directed. Do not stop taking except on your doctor's advice. Stopping this medicine may increase your risk of a blot clot. Be sure to refill your prescription before you run out of medicine. Talk to your pediatrician regarding the use of this medicine in children. Special care may be needed. Overdosage: If you think you have taken too much of this medicine contact a poison control center or emergency room at once. NOTE: This medicine is only for you. Do not share this medicine with others. What if I miss a dose? If you miss a dose, take it as soon as you can. If it is almost time for your next dose, take only that dose. Do not take double or extra doses. What may interact with this medicine? This medicine may interact with the following: -aspirin and aspirin-like medicines -certain medicines for fungal infections like ketoconazole and itraconazole -certain medicines for seizures like carbamazepine and phenytoin -certain medicines that treat or prevent blood clots like warfarin, enoxaparin, and dalteparin -clarithromycin -NSAIDs, medicines for pain and inflammation, like ibuprofen or naproxen -rifampin -ritonavir -St. John's wort This list may not describe all possible interactions. Give your health care provider a list of all the medicines, herbs, non-prescription drugs, or dietary supplements you use. Also tell them if you smoke, drink alcohol, or use illegal drugs. Some items may interact with your medicine. What should I watch for while using this medicine? Notify your doctor or health care professional and seek emergency treatment if you develop breathing problems; changes in vision; chest pain; severe, sudden headache; pain, swelling, warmth in the leg; trouble speaking; sudden numbness or weakness of the face, arm, or leg. These can be signs that your condition has gotten worse. If you are going to have surgery, tell your doctor or health care professional that you  are taking this medicine. Tell your health care professional that you use this medicine before you have a spinal or epidural procedure. Sometimes people who take this medicine have bleeding problems around the spine when they have a spinal or epidural procedure. This bleeding is very rare. If you have a spinal or epidural procedure while on this medicine, call your health care professional immediately if you have back pain, numbness or tingling (especially in your legs and feet), muscle weakness, paralysis, or loss of bladder or bowel control. Avoid sports and activities that might cause injury while you are using this medicine. Severe falls or injuries can cause unseen bleeding. Be careful when using sharp tools or knives. Consider using an Copy. Take special care brushing or flossing your teeth. Report any injuries, bruising, or red spots on the skin to your doctor or health care professional. What side effects may I notice from receiving this medicine? Side effects that you should report to your doctor or health care professional as soon as possible: -allergic reactions like skin rash, itching or hives, swelling of the face, lips, or tongue -signs and symptoms of bleeding such as bloody or black, tarry stools; red or dark-brown urine; spitting up blood or brown material that looks like coffee grounds; red spots on the skin; unusual bruising or bleeding from the eye,  gums, or nose This list may not describe all possible side effects. Call your doctor for medical advice about side effects. You may report side effects to FDA at 1-800-FDA-1088. Where should I keep my medicine? Keep out of the reach of children. Store at room temperature between 20 and 25 degrees C (68 and 77 degrees F). Throw away any unused medicine after the expiration date. NOTE: This sheet is a summary. It may not cover all possible information. If you have questions about this medicine, talk to your doctor, pharmacist, or  health care provider.  2015, [employer] Standard. (2013-02-21 11:59:24) Atrial Fibrillation Atrial fibrillation is a condition that causes your heart to beat irregularly. It may also cause your heart to beat faster than normal. Atrial fibrillation can prevent your heart from pumping blood normally. It increases your risk of stroke and heart problems. HOME CARE  Take medications as told by your doctor.  Only take medications that your doctor says are safe. Some medications can make the condition worse or happen again.  If blood thinners were prescribed by your doctor, take them exactly as told. Too much can cause bleeding. Too little and you will not have the needed protection against stroke and other problems.  Perform blood tests at home if told by your doctor.  Perform blood tests exactly as told by your doctor.  Do not drink alcohol.  Do not drink beverages with caffeine such as coffee, soda, and some teas.  Maintain a healthy weight.  Do not use diet pills unless your doctor says they are safe. They may make heart problems worse.  Follow diet instructions as told by your doctor.  Exercise regularly as told by your doctor.  Keep all follow-up appointments. GET HELP IF:  You notice a change in the speed, rhythm, or strength of your heartbeat.  You suddenly begin peeing (urinating) more often.  You get tired more easily when moving or exercising. GET HELP RIGHT AWAY IF:   You have chest or belly (abdominal) pain.  You feel sick to your stomach (nauseous).  You are short of breath.  You suddenly have swollen feet and ankles.  You feel dizzy.  You face, arms, or legs feel numb or weak.  There is a change in your vision or speech. MAKE SURE YOU:   Understand these instructions.  Will watch your condition.  Will get help right away if you are not doing well or get worse. Document Released: 03/28/2008 Document Revised: 11/03/2013 Document Reviewed:  07/30/2012 [facility] Patient Information 2015 [place], [place]. This information is not intended to replace advice given to you by your health care provider. Make sure you discuss any questions you have with your health care provider.

## 2014-12-23 NOTE — Consult Note (Signed)
CARDIOLOGY CONSULT NOTE  Patient ID: Timothy Guerrero MRN: [id number] DOB/AGE: 01/14/1941 74 y.o.  Admit date: 12/17/2014 Primary Physician [place], Orson Ape, FNP Primary Cardiologist None Chief Complaint  Atrial fib.    HPI:  The patient is admitted for RLL lobectomy for non small cell cancer.  He has been noted to have intermittent atrial flutter on telemetry.   He has had a history of chest pain and [place]  He apparently failed a stress perfusion study.  However, follow up cath demonstrated apparently small vessel disease.  He was told there was a blockage but that it needed to be managed medically.  We don't yet have these records.    He reports that he's had some chest discomfort on and off for a couple of years. It seems to happen more with exertion. She says it's a stable pattern. He describes a 5 out of 10 intensity discomfort that's below his sternum and upper epigastric area. There is no other radiation. He does get short of breath with this but doesn't describe any associated symptoms such as nausea vomiting or diaphoresis. He thinks his heart might go fast with this. However, when he's been in flutter for very short periods of time here in the hospital he hasn't had any symptoms. He doesn't have any irregular rhythms. He denies any presyncope or syncope. Aside from doing a little slow dancing he's not particularly active. He did have a workup mentioned above for this chest discomfort. During this workup he was incidentally found to have a lung nodule.  The patient denies any weight gain or edema. He's had no PND or orthopnea.   Past Medical History  Diagnosis Date  . Diabetes mellitus without complication   . Hypertension   . Hypercholesteremia   . Colon polyp     2007, polyp with early cancer, one year f/u no residual polyp. next TCS 2010, see P[facility]. [facility].  . Skin melanoma   . Sleep apnea     wears CPAP sometimes  . Shortness of  breath dyspnea     due to lung mass  . HOH (hard of hearing)     Past Surgical History  Procedure Laterality Date  . Prostate surgery    . Knee replacements      bilateral. over 10 years ago  . Neck surgery    . Total hip arthroplasty  2007    left  . Total shoulder replacement  2011    left  . Colonoscopy  08/2008    Dr. Leafy Half, [place]: normal, internal grade 1 hemorrhoids. next TCS 08/2013  . Colonoscopy N/A 03/04/2014    Procedure: COLONOSCOPY;  Surgeon: Daneil Dolin, MD;  [facility];  Service: Endoscopy;  Laterality: N/A;  7:30am  . Cardiac catheterization      no PCI  . Video assisted thoracoscopy (vats)/wedge resection Right 12/17/2014    Procedure: RIGHT VIDEO ASSISTED THORACOSCOPY (VATS);  Surgeon: Melrose Nakayama, MD;  Location: [place];  Service: Thoracic;  Laterality: Right;  . Lobectomy Right 12/17/2014    Procedure: RIGHT LOWER LOBECTOMY;  Surgeon: Melrose Nakayama, MD;  Location: [place];  Service: Thoracic;  Laterality: Right;  . Lymph node dissection Right 12/17/2014    Procedure: LYMPH NODE DISSECTION;  Surgeon: Melrose Nakayama, MD;  Location: [place];  Service: Thoracic;  Laterality: Right;    No Known Allergies Prescriptions  prior to admission  Medication Sig Dispense Refill Last Dose  . aspirin 325 MG tablet Take 325 mg by mouth daily.   12/16/2014 at Unknown time  . cholecalciferol (VITAMIN D) 1000 UNITS tablet Take 1,000 Units by mouth daily.   12/16/2014 at Unknown time  . cloNIDine (CATAPRES) 0.2 MG tablet Take 0.2 mg by mouth daily.    12/16/2014 at Unknown time  . glipiZIDE (GLUCOTROL) 10 MG tablet Take 10 mg by mouth daily.   12/16/2014 at Unknown time  . hydrochlorothiazide (HYDRODIURIL) 25 MG tablet Take 25 mg by mouth daily.   12/16/2014 at Unknown time  . ibuprofen (ADVIL,MOTRIN) 400 MG tablet Take 400 mg by mouth daily.    12/16/2014 at Unknown time  . insulin glargine (LANTUS) 100 UNIT/ML injection Inject 50  Units into the skin daily. Takes in the morning   12/17/2014 25 unitts at 0630  . lisinopril (PRINIVIL,ZESTRIL) 40 MG tablet Take 40 mg by mouth daily.   12/16/2014 at Unknown time  . metFORMIN (GLUCOPHAGE) 1000 MG tablet Take 1,000 mg by mouth 2 (two) times daily.   12/16/2014 at Unknown time  . metoprolol succinate (TOPROL-XL) 100 MG 24 hr tablet Take 100 mg by mouth daily.    12/16/2014 at Unknown time  . tiotropium (SPIRIVA) 18 MCG inhalation capsule Place 18 mcg into inhaler and inhale daily.   12/17/2014 at Unknown time   Family History  Problem Relation Age of Onset  . Colon cancer Neg Hx     History   Social History  . Marital Status: Divorced    Spouse Name: N/A  . Number of Children: 3  . Years of Education: N/A   Occupational History  . retired    Social History Main Topics  . Smoking status: Former Smoker -- 2.00 packs/day    Types: Cigarettes    Quit date: 07/03/1986  . Smokeless tobacco: Not on file  . Alcohol Use: No  . Drug Use: No  . Sexual Activity: Not on file   Other Topics Concern  . Not on file   Social History Narrative   [employer].     ROS:    As stated in the HPI and negative for all other systems.  Physical Exam: Blood pressure 186/76, pulse 91, temperature 97.7 F (36.5 C), temperature source Oral, resp. rate 18, height 6' (1.829 m), weight 211 lb 6.4 oz (95.89 kg), SpO2 100 %.  GENERAL:  Well appearing HEENT:  Pupils equal round and reactive, fundi not visualized, oral mucosa unremarkable NECK:  No jugular venous distention, waveform within normal limits, carotid upstroke brisk and symmetric, no bruits, no thyromegaly LYMPHATICS:  No cervical, inguinal adenopathy LUNGS:   Decreased breath sounds right lung BACK:  No CVA tenderness CHEST:  Thoracotomy scars HEART:  PMI not displaced or sustained,S1 and S2 within normal limits, no S3, no S4, no clicks, no rubs, no murmurs ABD:  Flat, positive bowel sounds normal in frequency in pitch, no bruits, no  rebound, no guarding, no midline pulsatile mass, no hepatomegaly, no splenomegaly EXT:  2 plus pulses throughout, no edema, no cyanosis no clubbing SKIN:  No rashes no nodules NEURO:  Cranial nerves II through XII grossly intact, motor grossly intact throughout PSYCH:  Cognitively intact, oriented to person place and time   Labs: Lab Results  Component Value Date   BUN 9 12/19/2014   Lab Results  Component Value Date   CREATININE 0.78 12/19/2014   Lab Results  Component Value Date  NA 140 12/19/2014   K 3.5 12/19/2014   CL 105 12/19/2014   CO2 31 12/19/2014   No results found for: TROPONINI Lab Results  Component Value Date   WBC 9.3 12/19/2014   HGB 11.4* 12/19/2014   HCT 35.3* 12/19/2014   MCV 92.2 12/19/2014   PLT 151 12/19/2014   No results found for: CHOL, HDL, LDLCALC, LDLDIRECT, TRIG, CHOLHDL Lab Results  Component Value Date   ALT 12* 12/19/2014   AST 14* 12/19/2014   ALKPHOS 41 12/19/2014   BILITOT 0.8 12/19/2014     Radiology:   CXR: Right lower chest tube has been removed. Stable small right apical pneumothorax. No infiltrate or pulmonary edema. Small right basilar  Atelectasis.  EKG: 12/15/14  Sinus rhythm, rate 86, axis within normal limits, intervals within normal limits, no acute ST-T wave changes.   ASSESSMENT AND PLAN:   ATRIAL FLUTTER:  I reviewed his rhythm strips. He has short bursts of P [place] did not see fibrillation. He has no contraindication to anticoagulation and he will start Eliquis.  We will try to get the records from the [facility] as he's had complete cardiac workup. I will set him up to be seen in the Atrial Fib clinic.  Further outpatient monitoring to see if he is having further paroxysms would be indicated.  Rather than committing him to long-term anticoagulation flutter ablation might also be appropriate in his future.  Timothy Guerrero has a CHA2DS2 - VASc score of 4 with a risk of stroke of 4%  .  He will stop ASA  also he takes Motrin daily and he was told to stop this. He actually doesn't need it apparently for pain.   CAD:  I will try to get his [place] records.  He needs continued risk reduction.  He can follow up with me in six months or so for this.  I can see him in my [place] clinic.       SignedMinus Breeding 12/23/2014, 9:42 AM

## 2014-12-23 NOTE — Progress Notes (Signed)
[address]       [place] [address]             [phone]      6 Days Post-Op Procedure(s) (LRB): RIGHT VIDEO ASSISTED THORACOSCOPY (VATS) (Right) RIGHT LOWER LOBECTOMY (Right) LYMPH NODE DISSECTION (Right) Subjective: Still with PAF albeit better rate control  Objective: Vital signs in last 24 hours: Temp:  [98.3 F (36.8 C)-98.7 F (37.1 C)] 98.7 F (37.1 C) (06/22 0534) Pulse Rate:  [76-86] 78 (06/22 0829) Cardiac Rhythm:  [-] Normal sinus rhythm (06/22 0820) Resp:  [16-18] 18 (06/22 0829) BP: (128-151)/(66-84) 151/66 mmHg (06/22 0534) SpO2:  [98 %-100 %] 99 % (06/22 0829) Weight:  [211 lb 6.4 oz (95.89 kg)] 211 lb 6.4 oz (95.89 kg) (06/21 1721)  Hemodynamic parameters for last 24 hours:    Intake/Output from previous day: 06/21 0701 - 06/22 0700 In: 840 [P.O.:840] Out: 300 [Urine:300] Intake/Output this shift:    General appearance: alert, cooperative and no distress Heart: regular rate and rhythm Lungs: clear to auscultation bilaterally Abdomen: soft , non-tender Extremities: no edema Wound: incis healing well  Lab Results: No results for input(s): WBC, HGB, HCT, PLT in the last 72 hours. BMET: No results for input(s): NA, K, CL, CO2, GLUCOSE, BUN, CREATININE, CALCIUM in the last 72 hours.  PT/INR: No results for input(s): LABPROT, INR in the last 72 hours. ABG    Component Value Date/Time   PHART 7.357 12/18/2014 0418   HCO3 26.9* 12/18/2014 0418   TCO2 28 12/18/2014 0418   O2SAT 90.0 12/18/2014 0418   CBG (last 3)   Recent Labs  12/22/14 1613 12/22/14 2110 12/23/14 0613  GLUCAP 81 142* 126*    Meds Scheduled Meds: . amiodarone  400 mg Oral BID  . antiseptic oral rinse  7 mL Mouth Rinse BID  . aspirin  325 mg Oral Daily  . bisacodyl  10 mg Oral Daily  . cloNIDine  0.2 mg Oral Daily  . enoxaparin (LOVENOX) injection  40 mg Subcutaneous Q24H  . glipiZIDE  10 mg Oral QAC breakfast  . hydrochlorothiazide  25 mg  Oral Daily  . insulin aspart  0-15 Units Subcutaneous TID WC  . insulin aspart  0-5 Units Subcutaneous QHS  . insulin glargine  50 Units Subcutaneous Daily  . lisinopril  40 mg Oral Daily  . metFORMIN  1,000 mg Oral BID WC  . metoprolol succinate  100 mg Oral Daily  . pantoprazole  40 mg Oral Daily  . senna-docusate  1 tablet Oral QHS  . tiotropium  18 mcg Inhalation Daily   Continuous Infusions: . sodium chloride 10 mL/hr at 12/20/14 2000   PRN Meds:.diphenhydrAMINE **OR** diphenhydrAMINE, labetalol, levalbuterol, naloxone **AND** sodium chloride, ondansetron (ZOFRAN) IV, oxyCODONE, potassium chloride, traMADol  Xrays Dg Chest 2 View  12/22/2014   CLINICAL DATA:  Right upper lobectomy.  EXAM: CHEST  2 VIEW  COMPARISON:  12/21/2014.  FINDINGS: Interim removal right IJ line. Interval removal of right chest tube. Mediastinum hilar structures stable. Heart size normal. Stable right apical pneumothorax. Stable bibasilar subsegmental atelectasis. Tiny calcified pulmonary nodules noted most likely from prior granulomas disease. Small right pleural effusion. Left shoulder replacement.  IMPRESSION: 1. Interim removal of right IJ line and right chest tube. 2. Stable small right apical pneumothorax. 3. Mild bibasilar subsegmental atelectasis. Small right pleural effusion.   Electronically Signed   By: Marcello Moores  Register   On: 12/22/2014 07:38   Dg Chest  1v Repeat Same Day  12/21/2014   CLINICAL DATA:  Post chest tube removal  EXAM: CHEST - 1 VIEW SAME DAY  COMPARISON:  Prior film same day  FINDINGS: Cardiomediastinal silhouette is stable. Right lower chest tube has been removed. Stable small right apical pneumothorax. No acute infiltrate or pulmonary edema. Mild right basilar atelectasis. Left lung is clear. Right IJ central line has been removed.  IMPRESSION: Right lower chest tube has been removed. Stable small right apical pneumothorax. No infiltrate or pulmonary edema. Small right basilar atelectasis.    Electronically Signed   By: Lahoma Crocker M.D.   On: 12/21/2014 11:46    Assessment/Plan: S/P Procedure(s) (LRB): RIGHT VIDEO ASSISTED THORACOSCOPY (VATS) (Right) RIGHT LOWER LOBECTOMY (Right) LYMPH NODE DISSECTION (Right)  1 pretty stable, cont po amio load for afib 2 cont to push rehab and pulm toilet 3 recheck K,Mg, also check TSH  LOS: 6 days    Kahleb Mcclane E 12/23/2014

## 2014-12-23 NOTE — Progress Notes (Signed)
Discussed with nurse, filled out form to obtain Medical record from [facility] in [place]. Followup with a-fib clinic already arranged.   Hilbert Corrigan PA Pager: [phone]

## 2014-12-24 ENCOUNTER — Telehealth: Payer: Self-pay | Admitting: Cardiology

## 2014-12-24 NOTE — Telephone Encounter (Signed)
I called the patient back.  I stopped his clonidine.  He will not be taking amiodarone.  He will take 20 mg of lisinopril.  He will stop the HCTZ.  Other meds will remain as listed.  He has follow up on 6/30 in the A Fib Clinic.  His blood pressure is still running slightly low.

## 2014-12-24 NOTE — Telephone Encounter (Signed)
Pt called in wanting to speak with Dr. Percival Spanish about some of the medications he is currently taking, he says that his BP is dropping and would like to know what to do. Please call  Thanks

## 2014-12-24 NOTE — Telephone Encounter (Signed)
Returned call to patient he stated he was discharged from hospital yesterday.Stated he is very weak,low B/P 80/50,70/50 pulse 70's.Stated he is taking clonidine 0.3 mg daily.Stated he thinks clonidine is too strong.Stated he took clonidine in the past,took 0.1 mg and that caused low B/P, he was unable to take.Advised I will send message to Dr.Hochrein for advice.

## 2014-12-25 ENCOUNTER — Telehealth: Payer: Self-pay | Admitting: Cardiology

## 2014-12-25 MED ORDER — LISINOPRIL 40 MG PO TABS
20.0000 mg | ORAL_TABLET | Freq: Every day | ORAL | Status: DC
Start: 2014-12-25 — End: 2015-02-10

## 2014-12-25 NOTE — Telephone Encounter (Signed)
Spoke with pt, he voiced understanding of medication changes. He is aware of the time and location of his f/u appt in the atrial fib clinic.

## 2014-12-25 NOTE — Telephone Encounter (Signed)
[facility] from [facility] for appointment on 02/10/15 with Dr Percival Spanish..  Records given to [facility] (medical records) for Dr Hochrein's schedule on 02/10/15. lp

## 2014-12-30 ENCOUNTER — Other Ambulatory Visit: Payer: Self-pay | Admitting: Thoracic Surgery (Cardiothoracic Vascular Surgery)

## 2014-12-30 DIAGNOSIS — R918 Other nonspecific abnormal finding of lung field: Secondary | ICD-10-CM

## 2014-12-31 ENCOUNTER — Encounter (HOSPITAL_COMMUNITY): Payer: Self-pay | Admitting: Nurse Practitioner

## 2014-12-31 ENCOUNTER — Ambulatory Visit (HOSPITAL_COMMUNITY)
Admit: 2014-12-31 | Discharge: 2014-12-31 | Disposition: A | Discharge status: Home / Self Care | Payer: Non-veteran care | Source: Ambulatory Visit | Attending: Nurse Practitioner | Admitting: Nurse Practitioner

## 2014-12-31 VITALS — BP 170/100 | HR 85 | Ht 72.0 in | Wt 213.6 lb

## 2014-12-31 DIAGNOSIS — I4891 Unspecified atrial fibrillation: Secondary | ICD-10-CM | POA: Diagnosis not present

## 2014-12-31 DIAGNOSIS — I4892 Unspecified atrial flutter: Secondary | ICD-10-CM | POA: Diagnosis not present

## 2014-12-31 DIAGNOSIS — R9431 Abnormal electrocardiogram [ECG] [EKG]: Secondary | ICD-10-CM | POA: Insufficient documentation

## 2014-12-31 MED ORDER — HYDROCHLOROTHIAZIDE 25 MG PO TABS: 25.0000 mg | ORAL_TABLET | Freq: Every day | ORAL | Status: DC

## 2014-12-31 NOTE — Progress Notes (Signed)
Patient ID: Timothy Guerrero, male   DOB: October 29, 1940, 74 y.o.   MRN: [id number]    Primary Care Physician: Lysbeth Penner, FNP Referring Physician: Dr. Gilmer Mor Timothy Guerrero is a 74 y.o. male with a h/o tobacco abuse, HTN, CAD medically managed, DM, who had a rt video assisted thoracoscopy with RLL superior segmentectomy, RLL lobectomy, lymph node dissection and pleural biopsy. He was noted to have aflutter on telemetry from which pt was asymptomatic. He was started on apixaban for chadsvasc of at least 4 and amiodarone 400 mg bid, for which he is being evaluated in the afib clinic.  However, he had issues with hypotension with systolic BP around 88, day after discharge, and was instructed to stop HCTZ, clonidine and amiodarone. Pt is not aware of any aflutter. Denies any palpitations, shortness of breath or fatigue. He feels like he is improving daily. Denies any previous issues with atrial arrhythmia. Has had a cardiology work up in the past at the [place] in [place], [place], and was told he had small vessel disease and would be medically managed. Doing well with blood thinner wihout any signs of bleeding. BP  today though is poorly controlled.  Today, he denies symptoms of palpitations, chest pain, shortness of breath, orthopnea, PND, lower extremity edema, dizziness, presyncope, syncope, or neurologic sequela. The patient is tolerating medications without difficulties and is otherwise without complaint today.   Past Medical History  Diagnosis Date  . Diabetes mellitus without complication     10 years  . Hypertension     10 years  . Hypercholesteremia   . Colon polyp     2007, polyp with early cancer, one year f/u no residual polyp. next TCS 2010, see PSH. [facility].  . Skin melanoma   . Sleep apnea     wears CPAP sometimes  . Shortness of breath dyspnea     due to lung mass  . HOH (hard of hearing)   . Prostate cancer   . Atrial flutter     post op following lobectomy    Past Surgical History  Procedure Laterality Date  . Prostate surgery    . Knee replacements      bilateral. over 10 years ago  . Neck surgery    . Total hip arthroplasty  2007    left  . Total shoulder replacement  2011    left  . Colonoscopy  08/2008    Dr. Leafy Half, [place]: normal, internal grade 1 hemorrhoids. next TCS 08/2013  . Colonoscopy N/A 03/04/2014    Procedure: COLONOSCOPY;  Surgeon: Daneil Dolin, MD;  Location: [facility];  Service: Endoscopy;  Laterality: N/A;  7:30am  . Cardiac catheterization      no PCI  . Video assisted thoracoscopy (vats)/wedge resection Right 12/17/2014    Procedure: RIGHT VIDEO ASSISTED THORACOSCOPY (VATS);  Surgeon: Melrose Nakayama, MD;  Location: [facility];  Service: Thoracic;  Laterality: Right;  . Lobectomy Right 12/17/2014    Procedure: RIGHT LOWER LOBECTOMY;  Surgeon: Melrose Nakayama, MD;  Location: [facility];  Service: Thoracic;  Laterality: Right;  . Lymph node dissection Right 12/17/2014    Procedure: LYMPH NODE DISSECTION;  Surgeon: Melrose Nakayama, MD;  Location: [facility];  Service: Thoracic;  Laterality: Right;    Current Outpatient Prescriptions  Medication Sig Dispense Refill  . apixaban (ELIQUIS) 5 MG TABS tablet Take 1 tablet (5 mg total) by mouth 2 (  two) times daily. 60 tablet 1  . cholecalciferol (VITAMIN D) 1000 UNITS tablet Take 1,000 Units by mouth daily.    [name] glipiZIDE (GLUCOTROL) 10 MG tablet Take 10 mg by mouth daily.    . insulin glargine (LANTUS) 100 UNIT/ML injection Inject 50 Units into the skin daily. Takes in the morning    . isosorbide mononitrate (IMDUR) 60 MG 24 hr tablet Take 60 mg by mouth daily.    [name] lisinopril (PRINIVIL,ZESTRIL) 40 MG tablet Take 0.5 tablets (20 mg total) by mouth daily.    . metFORMIN (GLUCOPHAGE) 1000 MG tablet Take 1,000 mg by mouth 2 (two) times daily.    . metoprolol succinate (TOPROL-XL) 100 MG 24 hr tablet Take 100 mg by mouth daily.     [name] oxyCODONE (OXY  IR/ROXICODONE) 5 MG immediate release tablet Take 1-2 tablets (5-10 mg total) by mouth every 4 (four) hours as needed for severe pain. 50 tablet 0  . tiotropium (SPIRIVA) 18 MCG inhalation capsule Place 18 mcg into inhaler and inhale daily.    . hydrochlorothiazide (HYDRODIURIL) 25 MG tablet Take 1 tablet (25 mg total) by mouth daily.     No current facility-administered medications for this encounter.    No Known Allergies  History   Social History  . Marital Status: Divorced    Spouse Name: N/A  . Number of Children: 3  . Years of Education: N/A   Occupational History  . retired    Social History Main Topics  . Smoking status: Former Smoker -- 2.00 packs/day    Types: Cigarettes    Quit date: 07/03/1986  . Smokeless tobacco: Not on file  . Alcohol Use: No  . Drug Use: No  . Sexual Activity: Not on file   Other Topics Concern  . Not on file   Social History Narrative   [employer].  Lives alone.    Family History  Problem Relation Age of Onset  . Colon cancer Neg Hx   . Cancer Mother     unknown type  . COPD Sister   . Breast cancer Sister   . Prostate cancer Son     died age 49    ROS- All systems are reviewed and negative except as per the HPI above  Physical Exam: Filed Vitals:   12/31/14 1322 12/31/14 1357  BP: 178/102 170/100  Pulse: 85   Height: 6' (1.829 m)   Weight: 213 lb 9.6 oz (96.888 kg)     GEN- The patient is well appearing, alert and oriented x 3 today.   Head- normocephalic, atraumatic Eyes-  Sclera clear, conjunctiva pink Ears- hearing intact Oropharynx- clear Neck- supple, no JVP Lymph- no cervical lymphadenopathy Lungs- Clear to ausculation bilaterally, normal work of breathing, incisions healing without signs of infection Heart- Regular rate and rhythm, no murmurs, rubs or gallops, PMI not laterally displaced GI- soft, NT, ND, + BS Extremities- no clubbing, cyanosis, or edema MS- no significant deformity or atrophy Skin- no rash or  lesion Psych- euthymic mood, full affect Neuro- strength and sensation are intact  EKG-NSR with PR int 162 ms, QRS 88 ms, Qtc 480 ms. Epic records reviewed  Assessment and Plan:  1. Asymptomatic paroxysmal aflutter In NSR today Off amiodarone Continue metoprolol Would only pursue antiarrhythmic/ablation if aflutter is persistent and symptomatic  2. Chadsvasc score of at least 4 Continue apixaban No bleeding issues  3. S/p RLL lobectomy Doing well post procedure  4. HTN Poorly controlled Restart HCTZ 25 mg qd in  addition to lisinopril and metoprolol Has appointment with Dr. Roxan Hockey 7/5 at which time  BP can be reassessed. May need additional medication  F/u with Dr. Percival Spanish as scheduled 8/10 and afib clinic as needed.

## 2014-12-31 NOTE — Patient Instructions (Signed)
Restart HCTZ 

## 2015-01-01 ENCOUNTER — Telehealth: Payer: Self-pay | Admitting: *Deleted

## 2015-01-01 ENCOUNTER — Telehealth: Payer: Self-pay | Admitting: Cardiology

## 2015-01-01 NOTE — Telephone Encounter (Signed)
Received a call from patient B/P at present 117/64.Stated he wants to know what to do this weekend if B/P becomes elevated again.Advised he can take clonidine as directed by PCP.Advised he can call Dr on call if needed.Advised he just restarted HCTZ 25 mg yesterday.Message sent to [place] for advice.

## 2015-01-01 NOTE — Telephone Encounter (Signed)
Pt called in stating that his BP has been elevated his Dr. Percival Spanish adjusted his medications and he would like to speak with a nurse about it. Please advise  Thanks

## 2015-01-01 NOTE — Telephone Encounter (Signed)
Returned call to patient he stated he saw Roderic Palau NP in AFib clinic 12/31/14, B/P elevated 178/102 and she started HCTZ 25 mg daily.Stated B/P elevated this morning 213/103.He called PCP and was told to take clonidine 0.2 mg 1/2 tablet now and if B/P still elevated in 1/2 hour take the other 1/2 tablet.Advised to monitor B/P and if continues to be elevated to call back.Message sent to [place].

## 2015-01-01 NOTE — Telephone Encounter (Signed)
Patient recently discharged from the hospital. Calls today because his blood pressure is elevated at 211/113. He was at rehab when this was taken and the nurse advised him to call his PCP. Did not complain of any headache or chest pain.  Reviewed his chart and noted Dr Percival Spanish d/c his clonidine about 1 week ago due to hypotension. He has clonidine 0.2 mg strength on hand.  Asked him to take 1/2 tablet now and rck in 30 minutes. If blood pressure has not dropped below 150/90 take the 2nd half.  In the meantime call Dr Hochrein's office and let them know so they can adjust his medication since he recently changed it.  Patient stated understanding and agreement to plan.

## 2015-01-01 NOTE — Telephone Encounter (Signed)
His BP is not controlled.  I have told him to start clonidine 0.1 mg bid.

## 2015-01-05 ENCOUNTER — Ambulatory Visit
Admission: RE | Admit: 2015-01-05 | Discharge: 2015-01-05 | Disposition: A | Discharge status: Home / Self Care | Payer: Commercial Managed Care - HMO | Source: Ambulatory Visit | Attending: Thoracic Surgery (Cardiothoracic Vascular Surgery) | Admitting: Thoracic Surgery (Cardiothoracic Vascular Surgery)

## 2015-01-05 ENCOUNTER — Ambulatory Visit (INDEPENDENT_AMBULATORY_CARE_PROVIDER_SITE_OTHER): Payer: Self-pay | Admitting: Thoracic Surgery (Cardiothoracic Vascular Surgery)

## 2015-01-05 ENCOUNTER — Encounter: Payer: Self-pay | Admitting: Thoracic Surgery (Cardiothoracic Vascular Surgery)

## 2015-01-05 VITALS — BP 115/74 | HR 93 | Resp 16 | Ht 72.0 in | Wt 209.0 lb

## 2015-01-05 DIAGNOSIS — R918 Other nonspecific abnormal finding of lung field: Secondary | ICD-10-CM

## 2015-01-05 DIAGNOSIS — J90 Pleural effusion, not elsewhere classified: Secondary | ICD-10-CM | POA: Diagnosis not present

## 2015-01-05 DIAGNOSIS — C3431 Malignant neoplasm of lower lobe, right bronchus or lung: Secondary | ICD-10-CM

## 2015-01-05 NOTE — Progress Notes (Signed)
[address]       [place] [address]             [phone]      HPI:  Mr. Weigelt returns for a scheduled postoperative follow-up visit.  He is a 74 year old man who had a thoracoscopic right lower lobectomy on June 16 for what turned out to be a T1b, N0, stage IA non-small cell carcinoma. Postoperative course was, again by atrial fibrillation. In retrospect he said he been having some palpitations prior to surgery and may have been having atrial fibrillation previously. He was seen by cardiology and started on apixaban.  He is not having any significant incisional pain. He has not taken any oxycodone since discharge. He does complain of a numbness and uncomfortable sensation along the right costal margin. He also complains of a lump in his upper abdomen. Finally he is complaining of a knot on the left side of his neck that is more tender. That not has been there about 2 months.  He has not had any shortness of breath or swelling in his legs.  Past Medical History  Diagnosis Date  . Diabetes mellitus without complication     10 years  . Hypertension     10 years  . Hypercholesteremia   . Colon polyp     2007, polyp with early cancer, one year f/u no residual polyp. next TCS 2010, see PSH. [facility].  . Skin melanoma   . Sleep apnea     wears CPAP sometimes  . Shortness of breath dyspnea     due to lung mass  . HOH (hard of hearing)   . Prostate cancer   . Atrial flutter     post op following lobectomy      Current Outpatient Prescriptions  Medication Sig Dispense Refill  . apixaban (ELIQUIS) 5 MG TABS tablet Take 1 tablet (5 mg total) by mouth 2 (two) times daily. 60 tablet 1  . cholecalciferol (VITAMIN D) 1000 UNITS tablet Take 1,000 Units by mouth daily.    . cloNIDine (CATAPRES) 0.1 MG tablet Take 0.1 mg by mouth 2 (two) times daily.    [name] glipiZIDE (GLUCOTROL) 10 MG tablet Take 10 mg by mouth daily.    . insulin glargine  (LANTUS) 100 UNIT/ML injection Inject 50 Units into the skin daily. Takes in the morning    . metFORMIN (GLUCOPHAGE) 1000 MG tablet Take 1,000 mg by mouth 2 (two) times daily.     . isosorbide mononitrate (IMDUR) 60 MG 24 hr tablet Take 60 mg by mouth daily.    [name] lisinopril (PRINIVIL,ZESTRIL) 40 MG tablet Take 0.5 tablets (20 mg total) by mouth daily.    . metoprolol succinate (TOPROL-XL) 100 MG 24 hr tablet Take 100 mg by mouth daily.     [name] oxyCODONE (OXY IR/ROXICODONE) 5 MG immediate release tablet Take 1-2 tablets (5-10 mg total) by mouth every 4 (four) hours as needed for severe pain. 50 tablet 0  . tiotropium (SPIRIVA) 18 MCG inhalation capsule Place 18 mcg into inhaler and inhale daily.     No current facility-administered medications for this visit.    Physical Exam BP 115/74 mmHg  Pulse 93  Resp 16  Ht 6' (1.829 m)  Wt 209 lb (94.802 kg)  BMI 28.34 kg/m2  SpO9 13% 74 year old man in no acute distress Well-developed and well-nourished Neck 1 cm subcutaneous nodule left posterior neck consistent with sebaceous cyst  Lungs diminished breath sounds right base, otherwise clear Cardiac regular rate and rhythm normal S1 and S2  Diagnostic Tests: I reviewed his chest x-ray. It shows postoperative changes.  Impression: 74 year old man who is to a half weeks out from a thoracoscopic right lower lobectomy for a stage IA non-small cell carcinoma. He is doing extremely well at this point in time.  I reassured him that the sensations he feels along his costal margin and the "bulge" in his right upper quadrant are normal. The bulge is due to laxity of the abdominal musculature. Both of those issues improve with time in most cases. He is not having to take any narcotic Pain relievers.  May begin driving, appropriate precautions were discussed. There are no restrictions on physical activities although he was cautioned to start with very mild activity and build up from there as tolerated.  He  has stage IA disease and she does not need adjuvant chemotherapy. However I do want him to be seen and evaluated by an oncologist. He's requesting to do that at [facility]. We will arrange an appointment with Dr. Whitney Muse.  Plan: Refer to Dr. Ancil Linsey.  I will see him back in 2 months with a chest x-ray  Melrose Nakayama, MD [facility] [phone]

## 2015-01-12 DIAGNOSIS — L720 Epidermal cyst: Secondary | ICD-10-CM | POA: Diagnosis not present

## 2015-01-12 DIAGNOSIS — L0213 Carbuncle of neck: Secondary | ICD-10-CM | POA: Diagnosis not present

## 2015-01-12 DIAGNOSIS — Z85828 Personal history of other malignant neoplasm of skin: Secondary | ICD-10-CM | POA: Diagnosis not present

## 2015-01-12 DIAGNOSIS — L0293 Carbuncle, unspecified: Secondary | ICD-10-CM | POA: Diagnosis not present

## 2015-01-12 DIAGNOSIS — Z85820 Personal history of malignant melanoma of skin: Secondary | ICD-10-CM | POA: Diagnosis not present

## 2015-01-15 ENCOUNTER — Telehealth: Payer: Self-pay | Admitting: Cardiology

## 2015-01-15 NOTE — Telephone Encounter (Signed)
Spoke to patient   He states his blood pressure has been elevated most of the time since last office vist in 12/2014 with Roderic Palau Feeling well other than increased blood pressure. Range 180-200/80. Today 's 197/97. Patient wanted to know if he should increase clonidine from 1/2 tablet of 0.2 mg (0.1 mg) twice a day. RN informed patient will defer to Dr Percival Spanish and Roderic Palau NP

## 2015-01-15 NOTE — Telephone Encounter (Signed)
Increase to the whole clonidine pill twice daily 0.'2mg'$  bid.

## 2015-01-15 NOTE — Telephone Encounter (Signed)
Please call,having problems with his blood pressure.

## 2015-01-18 ENCOUNTER — Encounter (HOSPITAL_COMMUNITY): Payer: Commercial Managed Care - HMO | Attending: Hematology & Oncology | Admitting: Hematology & Oncology

## 2015-01-18 ENCOUNTER — Encounter (HOSPITAL_COMMUNITY): Payer: Self-pay | Admitting: Hematology & Oncology

## 2015-01-18 VITALS — BP 199/91 | HR 77 | Temp 98.1°F | Resp 20 | Ht 72.0 in | Wt 218.2 lb

## 2015-01-18 DIAGNOSIS — M2550 Pain in unspecified joint: Secondary | ICD-10-CM | POA: Diagnosis not present

## 2015-01-18 DIAGNOSIS — Z8546 Personal history of malignant neoplasm of prostate: Secondary | ICD-10-CM

## 2015-01-18 DIAGNOSIS — Z902 Acquired absence of lung [part of]: Secondary | ICD-10-CM

## 2015-01-18 DIAGNOSIS — Z803 Family history of malignant neoplasm of breast: Secondary | ICD-10-CM

## 2015-01-18 DIAGNOSIS — C3431 Malignant neoplasm of lower lobe, right bronchus or lung: Secondary | ICD-10-CM

## 2015-01-18 DIAGNOSIS — Z87891 Personal history of nicotine dependence: Secondary | ICD-10-CM

## 2015-01-18 DIAGNOSIS — J929 Pleural plaque without asbestos: Secondary | ICD-10-CM

## 2015-01-18 DIAGNOSIS — Z8042 Family history of malignant neoplasm of prostate: Secondary | ICD-10-CM

## 2015-01-18 DIAGNOSIS — R918 Other nonspecific abnormal finding of lung field: Secondary | ICD-10-CM

## 2015-01-18 NOTE — Progress Notes (Signed)
[place] [facility] NOTE  Patient Care Team: Lysbeth Penner, FNP as PCP - General (Family Medicine) Daneil Dolin, MD as Consulting Physician (Gastroenterology)  CHIEF COMPLAINTS/PURPOSE OF CONSULTATION:  CT of the chest on 09/09/2014 at [facility] with stable 2 cm adrenal nodule, felt to be an adrenal adenoma on the right, pleural plaques, calcified indicating asbestos related pleural disease, 2.2 cm irregular nodule in the superior segment of the right lower lobe  PET/CT reported as showing mass in RL was hypermetabolic (not available) PET/CT showed the right lower lobe mass was hyper metabolic with an SUV of 57.8. 1 cm right paratracheal node with a fatty hilum and SUV max of 3.8. No other suspicious findings  PFT's with normal FVC and FEV1 and DLCO of 60% Remote history of tobacco abuse History of multiple melanomas Prostate Cancer in 2001, s/p prostatectomy Cardiolite in 03/2014 low risk for ischemia  R vats,  R pleural biopsy, RLL superior segmentectomy, Thorascopic RL lobectomy, Mediastinal LN dissection Poorly differentiated Squamous cell carcinoma, T1b, N0 M0  HISTORY OF PRESENTING ILLNESS:  Timothy Guerrero 74 y.o. male is here because of newly diagnosed Stage IB squamous cell carcinoma of the R lung.  He has done well since surgery.  He has minimal to no post-operative pain.  The patient is here alone today and prefers to go by [place].  His hobbies include fishing and playing golf.  He is unable to do both right now due to his recent surgery.  He also says that his appetite since then has been terrible. He does however note a slow and steady improvement in his energy, his appetite although still poor is getting better.  The patient notes he was a heavy smoker but quit many years ago.  He had a CT scan done at the [place] in [place].  CT imaging showed a mass in the RLL that was new. He ultimately underwent a CT-guided biopsy on May  6, by report it showed "atypical cells."  He had previously undergone a bronchoscopy that was non-diagnostic as well.  Repeat biopsy was discussed with the patient by Dr. Roxan Hockey, ultimately however it was decided to proceed with surgical resection.  As previously noted, he did remarkably well with surgery and is making a nice recovery.  Final pathology showed a poorly differentiated squamous cell carcinoma, T1b, N0M0. He is here today for additional discussion and recommendations of ongoing follow-up. He notes he has been instructed he will not need chemotherapy and is relieved about that.  The patient has complaints of pain in his chest when he exerts himself.  This is apparently more chronic in nature and has been evaluated several times. He had a Cardiolite in September that was low risk for ischemia. He previously had a stress test completed a couple of years ago but did not have one before his surgery.  He was diagnosed with Prostate cancer 15 years ago. His Primary, Dr. Laurance Flatten follows his PSA levels.  Follow up in 2 months.  He no longer sees his Urologist for his prostate.  He is up to date on his screening colonoscopies.   MEDICAL HISTORY:  Past Medical History  Diagnosis Date  . Diabetes mellitus without complication     10 years  . Hypertension     10 years  . Hypercholesteremia   . Colon polyp     2007, polyp with early cancer, one year f/u no residual polyp. next  TCS 2010, see PSH. [facility].  . Skin melanoma   . Sleep apnea     wears CPAP sometimes  . Shortness of breath dyspnea     due to lung mass  . HOH (hard of hearing)   . Prostate cancer   . Atrial flutter     post op following lobectomy  . Lung cancer     SURGICAL HISTORY: Past Surgical History  Procedure Laterality Date  . Prostate surgery    . Knee replacements      bilateral. over 10 years ago  . Neck surgery    . Total hip arthroplasty  2007    left  . Total shoulder replacement   2011    left  . Colonoscopy  08/2008    Dr. Leafy Half, [place]: normal, internal grade 1 hemorrhoids. next TCS 08/2013  . Colonoscopy N/A 03/04/2014    Procedure: COLONOSCOPY;  Surgeon: Daneil Dolin, MD;  [facility];  Service: Endoscopy;  Laterality: N/A;  7:30am  . Cardiac catheterization      no PCI  . Video assisted thoracoscopy (vats)/wedge resection Right 12/17/2014    Procedure: RIGHT VIDEO ASSISTED THORACOSCOPY (VATS);  Surgeon: Melrose Nakayama, MD;  Location: [place];  Service: Thoracic;  Laterality: Right;  . Lobectomy Right 12/17/2014    Procedure: RIGHT LOWER LOBECTOMY;  Surgeon: Melrose Nakayama, MD;  Location: [place];  Service: Thoracic;  Laterality: Right;  . Lymph node dissection Right 12/17/2014    Procedure: LYMPH NODE DISSECTION;  Surgeon: Melrose Nakayama, MD;  Location: [facility];  Service: Thoracic;  Laterality: Right;    SOCIAL HISTORY: History   Social History  . Marital Status: Divorced    Spouse Name: N/A  . Number of Children: 3  . Years of Education: N/A   Occupational History  . retired    Social History Main Topics  . Smoking status: Former Smoker -- 2.00 packs/day    Types: Cigarettes    Quit date: 07/03/1986  . Smokeless tobacco: Not on file  . Alcohol Use: No  . Drug Use: No  . Sexual Activity: Not on file   Other Topics Concern  . Not on file   Social History Narrative   [employer].  Lives alone.  3 children, 1 passed from prostate cancer at age 76 16 grandchildren Ex-smoker. Started at 31, stopped in 1988.  2.5 ppd  FAMILY HISTORY: Family History  Problem Relation Age of Onset  . Colon cancer Neg Hx   . Cancer Mother     unknown type  . COPD Sister   . Breast cancer Sister   . Prostate cancer Son     died age 6   has no family status information on file.  Mother deceased, 74, possibly cancer Father deceased, 81, car wreck 2 sisters both deceased; youngest at 26, breast cancer and oldest at  3, COPD 1 brother living  ALLERGIES:  has No Known Allergies.  MEDICATIONS:  Current Outpatient Prescriptions  Medication Sig Dispense Refill  . apixaban (ELIQUIS) 5 MG TABS tablet Take 1 tablet (5 mg total) by mouth 2 (two) times daily. 60 tablet 1  . Cholecalciferol 1000 UNITS TBDP Take 1,000 Int'l Units by mouth daily.    . cloNIDine (CATAPRES) 0.2 MG tablet Take 0.2 mg by mouth 2 (two) times daily.    [name] glipiZIDE (GLUCOTROL) 10 MG tablet Take 10 mg by mouth daily.    [name]  insulin glargine (LANTUS) 100 UNIT/ML injection Inject 50 Units into the skin daily. Takes in the morning    . lisinopril (PRINIVIL,ZESTRIL) 40 MG tablet Take 0.5 tablets (20 mg total) by mouth daily.    . metFORMIN (GLUCOPHAGE) 1000 MG tablet Take 1,000 mg by mouth 2 (two) times daily.     . metoprolol succinate (TOPROL-XL) 100 MG 24 hr tablet Take 100 mg by mouth daily.     [name] tiotropium (SPIRIVA) 18 MCG inhalation capsule Place 18 mcg into inhaler and inhale daily.     No current facility-administered medications for this visit.    Review of Systems  Constitutional: Negative.   HENT: Negative.   Eyes: Negative.   Respiratory: Negative.   Cardiovascular: Positive for chest pain. Negative for palpitations, orthopnea, claudication, leg swelling and PND.       During self-exertion    Gastrointestinal: Negative.   Genitourinary: Negative.   Musculoskeletal: Positive for joint pain.  Skin: Negative.   Neurological: Negative.   Endo/Heme/Allergies: Negative.   Psychiatric/Behavioral: Negative.   All other systems reviewed and are negative.  14 point ROS was done and is otherwise as detailed above or in HPI  PHYSICAL EXAMINATION: ECOG PERFORMANCE STATUS: 0 - Asymptomatic  Filed Vitals:   01/18/15 1432  BP: 199/91  Pulse: 77  Temp: 98.1 F (36.7 C)  Resp: 20   Filed Weights   01/18/15 1432  Weight: 218 lb 3.2 oz (98.975 kg)     Physical Exam  Constitutional: He is oriented to person, place, and  time and well-developed, well-nourished, and in no distress.  HENT:  Head: Normocephalic and atraumatic.  Right Ear: External ear normal.  Left Ear: External ear normal.  Nose: Nose normal.  Mouth/Throat: Oropharynx is clear and moist. No oropharyngeal exudate.  Eyes: Conjunctivae and EOM are normal. Pupils are equal, round, and reactive to light. Right eye exhibits no discharge. Left eye exhibits no discharge. No scleral icterus.  Neck: Normal range of motion. Neck supple. No tracheal deviation present. No thyromegaly present.  Cardiovascular: Normal rate, regular rhythm and normal heart sounds.  Exam reveals no gallop and no friction rub.   No murmur heard. Pulmonary/Chest: Effort normal and breath sounds normal. He has no wheezes. He has no rales.  Well healed surgical incision sites  Abdominal: Soft. Bowel sounds are normal. He exhibits no distension and no mass. There is no tenderness. There is no rebound and no guarding.  Musculoskeletal: Normal range of motion. He exhibits no edema.  Lymphadenopathy:    He has no cervical adenopathy.  Neurological: He is alert and oriented to person, place, and time. He has normal reflexes. No cranial nerve deficit. Gait normal. Coordination normal.  Skin: Skin is warm and dry. No rash noted.  Psychiatric: Mood, memory, affect and judgment normal.  Nursing note and vitals reviewed.   PATHOLOGY: FINAL DIAGNOSIS Diagnosis 1. Pleura, biopsy, right - FIBROSIS AND DYSTROPHIC CALCIFICATIONS. - THERE IS NO EVIDENCE OF MALIGNANCY. 2. Lymph node, biopsy, 8 - THERE IS NO EVIDENCE OF CARCINOMA IN 1 OF 1 LYMPH NODE (0/1). 3. Lung, wedge biopsy/resection, right lower lobe superior segment - INVASIVE SQUAMOUS CELL CARCINOMA, POORLY DIFFERENTIATED, SPANNING 2.8 CM. - CARCINOMA IS PRESENT AT THE SURGICAL RESECTION MARGIN OF SPECIMEN #3. - SEE ONCOLOGY TABLE BELOW. 4. Lung, resection (segmental or lobe), right lower lobe - BENIGN LUNG PARENCHYMA WITH  HEMORRHAGE AND SCATTERED FOCI OF ANTHRACOSIS. - THERE IS NO EVIDENCE OF CARCINOMA IN ONE OF LYMPH NODE (0/1). - THERE  IS NO EVIDENCE OF MALIGNANCY. 5. Pleura, biopsy, right #2 - FIBROSIS AND DYSTROPHIC CALCIFICATIONS. - THERE IS NO EVIDENCE OF MALIGNANCY. 6. Lymph node, biopsy, 12R - THERE IS NO EVIDENCE OF CARCINOMA IN 1 OF 1 LYMPH NODE (0/1) 7. Lymph node, biopsy, 12R #2 - THERE IS NO EVIDENCE OF CARCINOMA IN 1 OF 1 LYMPH NODE (0/1) 8. Lymph node, biopsy, 11R - THERE IS NO EVIDENCE OF CARCINOMA IN 1 OF 1 LYMPH NODE (0/1) 9. Lymph node, biopsy, 12R #3 - THERE IS NO EVIDENCE OF CARCINOMA IN 1 OF 1 LYMPH NODE (0/1) 10. Lymph node, biopsy, 12R #4 - THERE IS NO EVIDENCE OF CARCINOMA IN 1 OF 1 LYMPH NODE (0/1) 1 of 4 Amended copy Amended FINAL for Timothy Guerrero, Timothy Guerrero ([id number]) Diagnosis(continued) 11. Lymph node, biopsy, 12R #5 - THERE IS NO EVIDENCE OF CARCINOMA IN 1 OF 1 LYMPH NODE (0/1) 12. Lymph node, biopsy, 4R - THERE IS NO EVIDENCE OF CARCINOMA IN 1 OF 1 LYMPH NODE (0/1) 13. Lymph node, biopsy, 10R - THERE IS NO EVIDENCE OF CARCINOMA IN 1 OF 1 LYMPH NODE (0/1) 14. Lymph node, biopsy, 4R #2 - THERE IS NO EVIDENCE OF CARCINOMA IN 1 OF 1 LYMPH NODE (0/1) 15. Lymph node, biopsy, 4R #3 - THERE IS NO EVIDENCE OF CARCINOMA IN 1 OF 1 LYMPH NODE (0/1) Microscopic Comment 3. LUNG Specimen, including laterality: Right lower lobe Procedure: Complete lobectomy Specimen integrity (intact/disrupted): Intact (with staples) Tumor site: Right lower lobe, subpleural Tumor focality: Unifocal Maximum tumor size (cm): 2.8 cm (gross measurement) Histologic type: Squamous cell carcinoma Grade: High grade (poorly differentiated) Margins: Negative for carcinoma Distance to closest margin (cm): At least 4.0 cm to margins (gross measurement) Visceral pleura invasion: Not identified Tumor extension: Confined to lung parenchyma Treatment effect (if treated with neoadjuvant therapy): N/A Lymph  -Vascular invasion: Not identified Lymph nodes: Number examined - 12; Number N1 nodes positive 0; Number N2 nodes positive 0 TNM code: pT1b, pN0 Ancillary studies: Can be performed upon clinician request Non-neoplastic lung: Foci of acute inflammation with necrosis, anthracosis Enid Cutter MD Pathologist, Electronic Signature (Case signed 12/21/2014) Corrected Report Signer Enid Cutter MD Pathologist, Electronic Signature (Case signed 12/24/2014) 2 of 4 Amended copy Amended FINAL for Timothy Guerrero, Timothy Guerrero ([id number]) Intraoperative Diagnosis 1. RIGHT LUNG PLEURAL BIOPSY, FROZEN SECTION: FIBROSIS AND CALCIFICATIONS. (JBK) 2. LYMPH NODE, LEVEL 8, FROZEN SECTION: ANTHRACOSIS. (JBK) 3. LUNG, RIGHT LOWER LOBE SUPERIOR SEGMENT, MARGIN, FROZEN SECTION: MALIGNANT (JBK) 4. LUNG, RIGHT LOBE RESECTION, BRONCHIAL MARGIN, FROZEN SECTION: NEGATIVE FOR MALIGNANCY. (JBK) Specimen Gross and Clinical Information Specimen(s) Obtained: 1. Pleura, biopsy, right 2. Lymph node, biopsy, 8 3. Lung, wedge biopsy/resection, right lower lobe superior segment 4. Lung, resection (segmental or lobe), right lower lobe 5. Pleura, biopsy, right #2 6. Lymph node, biopsy, 12R 7. Lymph node, biopsy, 12R #2 8. Lymph node, biopsy, 11R 9. Lymph node, biopsy, 12R #3 10. Lymph node, biopsy, 12R #4 11. Lymph node, biopsy, 12R #5 12. Lymph node, biopsy, 4R 13. Lymph node, biopsy, 10R 14. Lymph node, biopsy, 4R #2 15. Lymph node, biopsy, 4R #3 Specimen Clinical Information 1. right lower lobe lung mass (cm) Gross 1. Received fresh is a 2.4 cm in diameter and up to 0.2 cm thick gray white to vaguely yellow fibrous-like tissue with scattered nodularity and gritty cut surfaces. Representative sections are submitted in block A for frozen section, with remaining specimen submitted in block B for routine histology. 2. Received fresh is a 1.1 x 1 x  0.3 cm aggregate of tan to anthracotic soft tissue, submitted in one block for  frozen section. 3. Received fresh is a 48 gram, 9 x 5 x 4 cm wedge of lung, clinically right lower lobe superior segment. The pleura is tan pink to red purple with scattered anthracosis, and few scattered adhesions. There are are three stapled areas, one of which has a suture and is clinically identified as closest margin. Found just beneath this stapled margin, and grossly involved with the staple line is a 2.8 x 2.6 x 2.3 cm tan white to anthracotic firm ill defined mass. Separate from the mass is a second area of tan red vaguely nodular and indurated parenchyma, 1.4 x 1.1 x 0.9 cm. Tissue at the staple line clinically identified as closest margin is submitted for frozen section in block A. Additional sections for routine histology are submitted as follows: B= mass. C= mass with overlying pleura. D= mass and and adjacent parenchyma. E= sections of nodular area of parenchyma separate from mass. Total 5 blocks. 4. Specimen: Right lower lobectomy. Specimen integrity (intact/incised/disrupted): There are staple lines over the superior segment. Size, weight: 198 grams, 11 x 9.6 x 4 cm. Pleura: Tan pink to hyperemic with moderate scattered anthracosis. Lesion: No residual lesion or mass is identified. Margin(s): Grossly free of lesions. 3 of 4 Amended copy Amended FINAL for Timothy Guerrero, Timothy Guerrero ([id number]) Gross(continued) Hilar vessels: Unremarkable. Nonneoplastic parenchyma: Tan red to dark red, spongy. Lymph nodes, level 12: At the hilum is a 0.8 cm soft anthracotic lymph node. Block Summary: A shave of the bronchial margin is submitted in block A for frozen section. Additional sections for routine histology are submitted as follows: B= vascular margins. C-E= tissue adjacent to stapled areas over superior segment. F= random parenchyma. G= one node, bisected. Total 7 blocks. 5. Received fresh are three irregular pieces of tan pink to yellow, soft to firm fibromembranous and fibrous-like  tissues ranging from 1 x 0.6 x 0.2 cm to 3.1 x 2.5 x 0.5 cm. The firm areas have nodular tissues with gritty cut surfaces. Representative sections in one block. 6. Received fresh labeled 12R node, is a 1 cm soft to rubbery anthracotic node, bisected, one block. 7. Received fresh labeled 12R node #2, is a 0.6 cm soft anthracotic node, bisected, one block. 8. Received fresh labeled 11R node, is a 1.7 x 1 x 0.3 cm aggregate of anthracotic, soft to rubbery tissue, submitted in one block. 9. Received fresh labeled 12R node #3 is a 0.9 cm soft rubbery, anthracotic node which is sectioned and submitted in one block. 10. Received in fresh labeled 12R node #4 is a 1 x 0.8 x 0.3 cm aggregate of anthracotic soft to rubbery tissue, submitted in one block. 11. Received fresh labeled 12R node #5, is a 0.6 x 0.4 x 0.2 cm aggregate of anthracotic soft tissue, submitted in one block. 12. Received fresh labeled labeled 4R node, is a 0.9 cm gray white to anthracotic soft to rubbery node, bisected, one block. 13. Received fresh labeled 10R node, is a 1 x 0.9 x 0.3 cm tan yellow to anthracotic soft tissue in toto, one block. 14. Received fresh labeled 4R node# 2 is a 1.6 x 1.4 x 0.3 cm aggregate of tan yellow to anthracotic soft rubbery tissue, entirely submitted in one block. 15. Received fresh labeled 4R node #3 is a 1 cm soft rubbery, anthracotic node, bisected, one block. (SW:gt, 12/21/14) Report signed out from the following location(s) Technical Component was  performed at [facility]. [address],[place] [address].[id number]., Interpretation was performed at [address], [place], [place] [address]. CLIA #: [id number], 4 of  LABORATORY DATA:  I have reviewed the data as listed Lab Results  Component Value Date   WBC 9.3 12/19/2014   HGB 11.4* 12/19/2014   HCT 35.3* 12/19/2014   MCV 92.2 12/19/2014   PLT 151 12/19/2014      RADIOGRAPHIC STUDIES: I have personally reviewed the radiological images as listed and agreed with the findings in the report. CLINICAL DATA: Follow-up from right lower lobectomy and lymph node dissection on December 17, 2014, currently no symptoms  EXAM: CHEST 2 VIEW  COMPARISON: Chest x-ray of December 22, 2014  FINDINGS: The left lung is well-expanded. There is stable volume loss on the right. The pulmonary interstitial markings have improved and normalized. The right-sided pneumothorax is no longer evident. There is a small right pleural effusion which appears smaller. The heart and pulmonary vascularity are normal. The mediastinum is normal in width. There is a prosthetic left shoulder.  IMPRESSION: Interval resolution of mild pulmonary interstitial edema. Interval resolution of the small right pneumothorax and interval decrease in the small right pleural effusion.   Electronically Signed  By: David Martinique M.D.  On: 01/05/2015 11:47  ASSESSMENT & PLAN:  T1bN0M0 squamous cell carcinoma of the RLL Prior history of tobacco abuse Pleural plaques suggestive of prior asbestos exposure   Patient and I reviewed his diagnosis in detail. I discussed with the patient that there is no indication for chemotherapy for his disease stage. I advised him that chemotherapy is typically only offered for Stage IB patients who we deem high risk, with features such as vascular invasion, tumor size > 4cm, visceral pleural involvement or inadequate LN sampling.  He has none of these features.  We reviewed the patient guidelines for NSCLC in detail.  Moving forward I reviewed surveillance guidelines:  NCCN guidelines for Non-Small Cell Lung Cancer Surveillance are as follows:  A. H+P every 6-12 months and CT of chest every 6 months for the first two years  B. Low-dose spiral CT chest annually after first two years of surveillance CTs   C. PET scan not typically indicated for  surveillance  D. Smoking cessation advice, counseling, and pharmacotherapy  We also addressed survivorship issues. He seems to be doing well recovering and emotionally is coping well. I advised him we will recommend an annual influenza vaccine, staying updated with his pneumococcal vaccination. It believes he obtains these through the [place].  I will see him back in 3 months to regroup. Additional follow-up with be in accordance with guidelines as detailed above.   All questions were answered. The patient knows to call the clinic with any problems, questions or concerns.   This document serves as a record of services personally performed by Ancil Linsey, MD. It was created on her behalf by Janace Hoard, a trained medical scribe. The creation of this record is based on the scribe's personal observations and the provider's statements to them. This document has been checked and approved by the attending provider.  I have reviewed the above documentation for accuracy and completeness, and I agree with the above.  This note was electronically signed.   Kelby Fam. Whitney Muse, MD

## 2015-01-18 NOTE — Telephone Encounter (Signed)
Spoke with pt about increasing his Clonidine to 0.2 mg BID, pt verbally understand he has 0.2 mg at home he is going to take 1 tablet twice a day

## 2015-01-18 NOTE — Patient Instructions (Signed)
[place] at [facility] Discharge Instructions  RECOMMENDATIONS MADE BY THE CONSULTANT AND ANY TEST RESULTS WILL BE SENT TO YOUR REFERRING PHYSICIAN.  Exam completed by Dr Whitney Muse today Return in 3 months to see the doctor and to have lab work completed. Please call the clinic if you have any questions or concerns     Thank you for choosing [place] at [facility] to provide your oncology and hematology care.  To afford each patient quality time with our provider, please arrive at least 15 minutes before your scheduled appointment time.    You need to re-schedule your appointment should you arrive 10 or more minutes late.  We strive to give you quality time with our providers, and arriving late affects you and other patients whose appointments are after yours.  Also, if you no show three or more times for appointments you may be dismissed from the clinic at the providers discretion.     Again, thank you for choosing [facility].  Our hope is that these requests will decrease the amount of time that you wait before being seen by our physicians.       _____________________________________________________________  Should you have questions after your visit to [facility], please contact our office at [phone] between the hours of 8:30 a.m. and 4:30 p.m.  Voicemails left after 4:30 p.m. will not be returned until the following business day.  For prescription refill requests, have your pharmacy contact our office.

## 2015-01-25 DIAGNOSIS — L300 Nummular dermatitis: Secondary | ICD-10-CM | POA: Diagnosis not present

## 2015-01-25 DIAGNOSIS — Z85820 Personal history of malignant melanoma of skin: Secondary | ICD-10-CM | POA: Diagnosis not present

## 2015-01-25 DIAGNOSIS — D1801 Hemangioma of skin and subcutaneous tissue: Secondary | ICD-10-CM | POA: Diagnosis not present

## 2015-01-25 DIAGNOSIS — L570 Actinic keratosis: Secondary | ICD-10-CM | POA: Diagnosis not present

## 2015-01-25 DIAGNOSIS — Z85828 Personal history of other malignant neoplasm of skin: Secondary | ICD-10-CM | POA: Diagnosis not present

## 2015-01-25 DIAGNOSIS — L821 Other seborrheic keratosis: Secondary | ICD-10-CM | POA: Diagnosis not present

## 2015-01-25 DIAGNOSIS — L723 Sebaceous cyst: Secondary | ICD-10-CM | POA: Diagnosis not present

## 2015-02-10 ENCOUNTER — Encounter: Payer: Self-pay | Admitting: Cardiology

## 2015-02-10 ENCOUNTER — Ambulatory Visit (INDEPENDENT_AMBULATORY_CARE_PROVIDER_SITE_OTHER): Payer: Commercial Managed Care - HMO | Admitting: Cardiology

## 2015-02-10 VITALS — BP 150/100 | HR 83 | Ht 72.0 in | Wt 220.0 lb

## 2015-02-10 DIAGNOSIS — I4892 Unspecified atrial flutter: Secondary | ICD-10-CM

## 2015-02-10 DIAGNOSIS — I10 Essential (primary) hypertension: Secondary | ICD-10-CM

## 2015-02-10 NOTE — Progress Notes (Signed)
Cardiology Office Note   Date:  02/10/2015   ID:  Timothy Guerrero, Timothy Guerrero February 28, 1941, MRN [id number]  PCP:  No primary care provider on file.  Cardiologist:   Minus Breeding, MD   Chief Complaint  Patient presents with  . Atrial Fibrillation      History of Present Illness: Timothy Guerrero is a 74 y.o. male who presents for follow-up of atrial flutter. He was doing this paroxysmally in the hospital when he was admitted recently for lobectomy for non-small cell lung cancer. We started him on anticoagulation. I did send him to the Atrial Fibrillation Clinic. However, they did not think ablation was indicated inject him on his anticoagulation. I been on the phone multiple times with him also to discuss his medications and blood pressure management. Finally I today reviewed extensive outpatient medical records to understand his past cardiac history. He's had nonobstructive disease and small vessel disease with high-grade stenosis and a nondominant right coronary artery on cath in 2013 in the [place]. He had a stress test in 2015 which was negative. He's been managed medically. He's had a well preserved ejection fraction.  Since going home he's had some palpitations but is not sure that he's had any fibrillation. He denies any chest pressure, neck or arm discomfort. He's had some pain related to his thoracotomy incision. He's had a little shortness of breath but is not having any PND or orthopnea. He's had no weight gain or edema.   Past Medical History  Diagnosis Date  . Diabetes mellitus without complication     10 years  . Hypertension     10 years  . Hypercholesteremia   . Colon polyp     2007, polyp with early cancer, one year f/u no residual polyp. next TCS 2010, see PSH. [facility].  . Skin melanoma   . Sleep apnea     wears CPAP sometimes  . Shortness of breath dyspnea     due to lung mass  . HOH (hard of hearing)   . Prostate cancer   . Atrial flutter     post op  following lobectomy  . Lung cancer     Past Surgical History  Procedure Laterality Date  . Prostate surgery    . Knee replacements      bilateral. over 10 years ago  . Neck surgery    . Total hip arthroplasty  2007    left  . Total shoulder replacement  2011    left  . Colonoscopy  08/2008    Dr. Leafy Half, [place]: normal, internal grade 1 hemorrhoids. next TCS 08/2013  . Colonoscopy N/A 03/04/2014    Procedure: COLONOSCOPY;  Surgeon: Daneil Dolin, MD;  [facility];  Service: Endoscopy;  Laterality: N/A;  7:30am  . Cardiac catheterization      no PCI  . Video assisted thoracoscopy (vats)/wedge resection Right 12/17/2014    Procedure: RIGHT VIDEO ASSISTED THORACOSCOPY (VATS);  Surgeon: Melrose Nakayama, MD;  Location: [place];  Service: Thoracic;  Laterality: Right;  . Lobectomy Right 12/17/2014    Procedure: RIGHT LOWER LOBECTOMY;  Surgeon: Melrose Nakayama, MD;  Location: [facility];  Service: Thoracic;  Laterality: Right;  . Lymph node dissection Right 12/17/2014    Procedure: LYMPH NODE DISSECTION;  Surgeon: Melrose Nakayama, MD;  Location: [facility];  Service: Thoracic;  Laterality: Right;     Current Outpatient Prescriptions  Medication Sig  Dispense Refill  . apixaban (ELIQUIS) 5 MG TABS tablet Take 1 tablet (5 mg total) by mouth 2 (two) times daily. 60 tablet 1  . Cholecalciferol 1000 UNITS TBDP Take 1,000 Int'l Units by mouth daily.    . cloNIDine (CATAPRES) 0.2 MG tablet Take 0.2 mg by mouth 2 (two) times daily.    [name] glipiZIDE (GLUCOTROL) 10 MG tablet Take 10 mg by mouth daily.    . insulin glargine (LANTUS) 100 UNIT/ML injection Inject 50 Units into the skin daily. Takes in the morning    . lisinopril (PRINIVIL,ZESTRIL) 40 MG tablet Take 40 mg by mouth daily.    . metFORMIN (GLUCOPHAGE) 1000 MG tablet Take 1,000 mg by mouth 2 (two) times daily.     . metoprolol succinate (TOPROL-XL) 100 MG 24 hr tablet Take 100 mg by mouth daily.     [name]  tiotropium (SPIRIVA) 18 MCG inhalation capsule Place 18 mcg into inhaler and inhale daily.     No current facility-administered medications for this visit.    Allergies:   Review of patient's allergies indicates no known allergies.    ROS:  Please see the history of present illness.   Otherwise, review of systems are positive for none.   All other systems are reviewed and negative.    PHYSICAL EXAM: VS:  BP 150/100 mmHg  Pulse 83  Ht 6' (1.829 m)  Wt 220 lb (99.791 kg)  BMI 29.83 kg/m2 , BMI Body mass index is 29.83 kg/(m^2). GENERAL:  Well appearing HEENT:  Pupils equal round and reactive, fundi not visualized, oral mucosa unremarkable NECK:  No jugular venous distention, waveform within normal limits, carotid upstroke brisk and symmetric, no bruits, no thyromegaly LYMPHATICS:  No cervical, inguinal adenopathy LUNGS:  Clear to auscultation bilaterally BACK:  No CVA tenderness CHEST: Well healed right thoracotomy scar HEART:  PMI not displaced or sustained,S1 and S2 within normal limits, no S3, no S4, no clicks, no rubs, no murmurs ABD:  Flat, positive bowel sounds normal in frequency in pitch, no bruits, no rebound, no guarding, no midline pulsatile mass, no hepatomegaly, no splenomegaly EXT:  2 plus pulses throughout, no edema, no cyanosis no clubbing SKIN:  No rashes no nodules NEURO:  Cranial nerves II through XII grossly intact, motor grossly intact throughout PSYCH:  Cognitively intact, oriented to person place and time    EKG:  EKG is ordered today. The ekg ordered today demonstrates sinus rhythm, rate 83, axis within normal limits, intervals within normal limits, no acute ST-T wave changes.   Recent Labs: 12/19/2014: ALT 12*; BUN 9; Creatinine, Ser 0.78; Hemoglobin 11.4*; Platelets 151; Potassium 3.5; Sodium 140    Lipid Panel No results found for: CHOL, TRIG, HDL, CHOLHDL, VLDL, LDLCALC, LDLDIRECT    Wt Readings from Last 3 Encounters:  02/10/15 220 lb (99.791  kg)  01/18/15 218 lb 3.2 oz (98.975 kg)  01/05/15 209 lb (94.802 kg)      Other studies Reviewed: Additional studies/ records that were reviewed today include: Hospital records. Review of the above records demonstrates:  Please see elsewhere in the note.     ASSESSMENT AND PLAN:  ATRIAL FLUTTER:  The patient had a long discussion about this. I think it is very likely that he has paroxysmal flutter. He would always detect this. Timothy Guerrero has a CHA2DS2 - VASc score of 4 with a risk of stroke of 4%.  I think anticoagulation chronically is indicated. He understands and accepts this risk. He however  does not want to keep pain for Eliquis. We will send him back to the Coumadin Clinic. He will be switched over to warfarin.  HTN:  His blood pressure is elevated today but is well controlled otherwise. He will continue the meds as listed.   CAD:  As mentioned he has small vessel disease but no symptoms. I will continue to manage this medically.  Current medicines are reviewed at length with the patient today.  The patient does not have concerns regarding medicines.  The following changes have been made:  As above  Labs/ tests ordered today include: None  No orders of the defined types were placed in this encounter.     Disposition:   FU with me in six months.     Signed, Minus Breeding, MD  02/10/2015 4:02 PM    [facility]

## 2015-02-10 NOTE — Patient Instructions (Signed)
Medication Instructions:  Your physician recommends that you continue on your current medications as directed. Please refer to the Current Medication list given to you today.  Follow-Up: Follow up in 6 months with Dr. Hochrein.  You will receive a letter in the mail 2 months before you are due.  Please call us when you receive this letter to schedule your follow up appointment.  Thank you for choosing [facility]!!       

## 2015-02-15 ENCOUNTER — Ambulatory Visit (INDEPENDENT_AMBULATORY_CARE_PROVIDER_SITE_OTHER): Payer: Commercial Managed Care - HMO | Admitting: Family Medicine

## 2015-02-15 ENCOUNTER — Ambulatory Visit: Payer: Commercial Managed Care - HMO | Admitting: Family Medicine

## 2015-02-15 ENCOUNTER — Encounter: Payer: Self-pay | Admitting: Family Medicine

## 2015-02-15 VITALS — BP 160/90 | HR 72 | Temp 97.5°F | Ht 72.0 in | Wt 218.2 lb

## 2015-02-15 DIAGNOSIS — I4892 Unspecified atrial flutter: Secondary | ICD-10-CM | POA: Diagnosis not present

## 2015-02-15 DIAGNOSIS — Z902 Acquired absence of lung [part of]: Secondary | ICD-10-CM

## 2015-02-15 DIAGNOSIS — R5383 Other fatigue: Secondary | ICD-10-CM | POA: Insufficient documentation

## 2015-02-15 DIAGNOSIS — Z9889 Other specified postprocedural states: Secondary | ICD-10-CM | POA: Diagnosis not present

## 2015-02-15 DIAGNOSIS — E119 Type 2 diabetes mellitus without complications: Secondary | ICD-10-CM

## 2015-02-15 DIAGNOSIS — I10 Essential (primary) hypertension: Secondary | ICD-10-CM

## 2015-02-15 LAB — POCT GLYCOSYLATED HEMOGLOBIN (HGB A1C): Hemoglobin A1C: 7.6

## 2015-02-15 LAB — POCT INR: INR: 1.1

## 2015-02-15 MED ORDER — CLONIDINE HCL 0.1 MG PO TABS
0.2000 mg | ORAL_TABLET | Freq: Once | ORAL | Status: AC
  Administered 2015-02-15: 0.2 mg via ORAL

## 2015-02-15 MED ORDER — HYDROCHLOROTHIAZIDE 25 MG PO TABS: 25.0000 mg | ORAL_TABLET | Freq: Every day | ORAL | Status: DC

## 2015-02-15 MED ORDER — WARFARIN SODIUM 5 MG PO TABS: 5.0000 mg | ORAL_TABLET | Freq: Every day | ORAL | Status: DC

## 2015-02-15 NOTE — Assessment & Plan Note (Signed)
Now with some exertional dyspnea Unclear whether this is due to lobectomy or more serious etiology

## 2015-02-15 NOTE — Progress Notes (Signed)
Patient ID: Timothy Guerrero, male   DOB: [birth year], 74 y.o.   MRN: [id number]   HPI  Patient presents today for discussion about anticoagulation and hypertension  Anticoagulation Patient has atrial flutter, he can afford his eliquis  He understands the risks involved with anticoagulation He would like to use warfarin No tachycardia  Hypertension Current headache described as mild, has noticed that his headaches have been associated with high blood pressure recently. No chest pain, dyspnea, palpitations, leg edema currently Has had more dyspnea on exertion since his surgery  Fatigue Describes 4 months of generalized fatigue. He feels run down and feels like he gets plenty of sleep but still never feels refreshed. He sleeping about 7-9 hours per night with a 2-3 hour nap during the day. He wonders if he should return to exercise - I encouraged him to do so.   PMH: Smoking status noted ROS: Per HPI, otherwise negative except for below Some exertional dyspnea, no orthopnea or PND No chest pain   Objective: BP 160/90 mmHg  Pulse 72  Temp(Src) 97.5 F (36.4 C) (Oral)  Ht 6' (1.829 m)  Wt 218 lb 3.2 oz (98.975 kg)  BMI 29.59 kg/m2 Gen: NAD, alert, cooperative with exam HEENT: NCAT CV: RRR, good S1/S2, no murmur Resp: CTABL, no wheezes, non-labored Ext: No edema, warm Neuro: Alert and oriented, No gross deficits  Assessment and plan:  S/P lobectomy of lung Now with some exertional dyspnea Unclear whether this is due to lobectomy or more serious etiology  Hypertension Extremity elevated today, initially 195/95, then down to 160/90 with just rest.  Considering that he has a headache currently on when he had and gave him his evening dose of clonidine in the clinic Start HCTZ Follow up 2 weeks  Atrial flutter Rate controlled On eliquis but wants warfarin due to cost Co-pay is 47$ per month, too much for him Start warfarin 5 mg Friday and come back Monday for INR check  with our clinical pharmacist for titration   Fatigue Generalized fatigue for 4 + months TSH, CBC  No clear etiology yet Encouraged return to exercise, he agrees    Orders Placed This Encounter  Procedures  . CBC with Differential  . TSH  . POCT glycosylated hemoglobin (Hb A1C)  . POCT INR    Meds ordered this encounter  Medications  . cloNIDine (CATAPRES) tablet 0.2 mg    Sig:   . warfarin (COUMADIN) 5 MG tablet    Sig: Take 1 tablet (5 mg total) by mouth daily.    Dispense:  30 tablet    Refill:  3  . hydrochlorothiazide (HYDRODIURIL) 25 MG tablet    Sig: Take 1 tablet (25 mg total) by mouth daily.    Dispense:  90 tablet    Refill:  [place], MD [place] 02/15/2015, 5:36 PM

## 2015-02-15 NOTE — Assessment & Plan Note (Signed)
Generalized fatigue for 4 + months TSH, CBC  No clear etiology yet Encouraged return to exercise, he agrees

## 2015-02-15 NOTE — Assessment & Plan Note (Signed)
Extremity elevated today, initially 195/95, then down to 160/90 with just rest.  Considering that he has a headache currently on when he had and gave him his evening dose of clonidine in the clinic Start HCTZ Follow up 2 weeks

## 2015-02-15 NOTE — Assessment & Plan Note (Signed)
Rate controlled On eliquis but wants warfarin due to cost Co-pay is 47$ per month, too much for him Start warfarin 5 mg Friday and come back Monday for INR check with our clinical pharmacist for titration

## 2015-02-15 NOTE — Patient Instructions (Signed)
Stop eliquis and start warfarin on Mod[place] 8/22  Make an appointment to see our clinical pharmacist on that Wednesday (2 days later)  Start HCTZ again  Nice to meet you!

## 2015-02-16 ENCOUNTER — Other Ambulatory Visit: Payer: Self-pay | Admitting: Family Medicine

## 2015-02-16 DIAGNOSIS — R7989 Other specified abnormal findings of blood chemistry: Secondary | ICD-10-CM | POA: Insufficient documentation

## 2015-02-16 LAB — CBC WITH DIFFERENTIAL/PLATELET
Basophils Absolute: 0.1 10*3/uL (ref 0.0–0.2)
Basos: 1 %
EOS (ABSOLUTE): 0.4 10*3/uL (ref 0.0–0.4)
Eos: 5 %
Hematocrit: 36.1 % — ABNORMAL LOW (ref 37.5–51.0)
Hemoglobin: 11.7 g/dL — ABNORMAL LOW (ref 12.6–17.7)
Immature Grans (Abs): 0 10*3/uL (ref 0.0–0.1)
Immature Granulocytes: 0 %
Lymphocytes Absolute: 1.6 10*3/uL (ref 0.7–3.1)
Lymphs: 23 %
MCH: 27.3 pg (ref 26.6–33.0)
MCHC: 32.4 g/dL (ref 31.5–35.7)
MCV: 84 fL (ref 79–97)
Monocytes Absolute: 0.8 10*3/uL (ref 0.1–0.9)
Monocytes: 11 %
Neutrophils Absolute: 4.1 10*3/uL (ref 1.4–7.0)
Neutrophils: 60 %
Platelets: 243 10*3/uL (ref 150–379)
RBC: 4.28 x10E6/uL (ref 4.14–5.80)
RDW: 15 % (ref 12.3–15.4)
WBC: 6.9 10*3/uL (ref 3.4–10.8)

## 2015-02-16 LAB — TSH: TSH: 6.31 u[IU]/mL — ABNORMAL HIGH (ref 0.450–4.500)

## 2015-02-17 ENCOUNTER — Other Ambulatory Visit (INDEPENDENT_AMBULATORY_CARE_PROVIDER_SITE_OTHER): Payer: Commercial Managed Care - HMO

## 2015-02-17 DIAGNOSIS — R7989 Other specified abnormal findings of blood chemistry: Secondary | ICD-10-CM

## 2015-02-17 DIAGNOSIS — R946 Abnormal results of thyroid function studies: Secondary | ICD-10-CM | POA: Diagnosis not present

## 2015-02-18 LAB — T4, FREE: Free T4: 1.11 ng/dL (ref 0.82–1.77)

## 2015-02-18 LAB — TSH: TSH: 4.73 u[IU]/mL — ABNORMAL HIGH (ref 0.450–4.500)

## 2015-02-24 ENCOUNTER — Other Ambulatory Visit: Payer: Self-pay | Admitting: *Deleted

## 2015-02-24 DIAGNOSIS — R7989 Other specified abnormal findings of blood chemistry: Secondary | ICD-10-CM

## 2015-02-24 MED ORDER — LEVOTHYROXINE SODIUM 50 MCG PO TABS: 50.0000 ug | ORAL_TABLET | Freq: Every day | ORAL | Status: DC

## 2015-03-01 ENCOUNTER — Telehealth: Payer: Self-pay | Admitting: Family Medicine

## 2015-03-01 ENCOUNTER — Ambulatory Visit (INDEPENDENT_AMBULATORY_CARE_PROVIDER_SITE_OTHER): Payer: Commercial Managed Care - HMO | Admitting: Pharmacist

## 2015-03-01 DIAGNOSIS — I4892 Unspecified atrial flutter: Secondary | ICD-10-CM | POA: Diagnosis not present

## 2015-03-01 LAB — POCT INR: INR: 2

## 2015-03-01 NOTE — Patient Instructions (Signed)
Anticoagulation Dose Instructions as of 03/01/2015      Dorene Grebe Tue Wed Thu Fri Sat   New Dose 5 mg 5 mg 5 mg 5 mg 5 mg 5 mg 5 mg    Description        Continue warfarin '5mg'$  - take 1 tablet daily     INR was 2.0 today (goal today is 2.0 to 3.0)

## 2015-03-01 NOTE — Telephone Encounter (Signed)
Spoke with patient and given an appointment today with Tammy to check protime.

## 2015-03-05 ENCOUNTER — Other Ambulatory Visit: Payer: Self-pay | Admitting: Thoracic Surgery (Cardiothoracic Vascular Surgery)

## 2015-03-05 DIAGNOSIS — R918 Other nonspecific abnormal finding of lung field: Secondary | ICD-10-CM

## 2015-03-09 ENCOUNTER — Encounter: Payer: Self-pay | Admitting: Thoracic Surgery (Cardiothoracic Vascular Surgery)

## 2015-03-09 ENCOUNTER — Ambulatory Visit (INDEPENDENT_AMBULATORY_CARE_PROVIDER_SITE_OTHER): Payer: Self-pay | Admitting: Thoracic Surgery (Cardiothoracic Vascular Surgery)

## 2015-03-09 ENCOUNTER — Ambulatory Visit (INDEPENDENT_AMBULATORY_CARE_PROVIDER_SITE_OTHER): Payer: Commercial Managed Care - HMO | Admitting: Pharmacist Clinician (PhC)/ Clinical Pharmacy Specialist

## 2015-03-09 ENCOUNTER — Ambulatory Visit
Admission: RE | Admit: 2015-03-09 | Discharge: 2015-03-09 | Disposition: A | Discharge status: Home / Self Care | Payer: Commercial Managed Care - HMO | Source: Ambulatory Visit | Attending: Thoracic Surgery (Cardiothoracic Vascular Surgery) | Admitting: Thoracic Surgery (Cardiothoracic Vascular Surgery)

## 2015-03-09 VITALS — BP 103/67 | HR 98 | Resp 16 | Ht 71.0 in | Wt 216.0 lb

## 2015-03-09 DIAGNOSIS — Z902 Acquired absence of lung [part of]: Secondary | ICD-10-CM

## 2015-03-09 DIAGNOSIS — C3490 Malignant neoplasm of unspecified part of unspecified bronchus or lung: Secondary | ICD-10-CM | POA: Insufficient documentation

## 2015-03-09 DIAGNOSIS — Z9889 Other specified postprocedural states: Secondary | ICD-10-CM

## 2015-03-09 DIAGNOSIS — R0602 Shortness of breath: Secondary | ICD-10-CM | POA: Diagnosis not present

## 2015-03-09 DIAGNOSIS — I4892 Unspecified atrial flutter: Secondary | ICD-10-CM

## 2015-03-09 DIAGNOSIS — R918 Other nonspecific abnormal finding of lung field: Secondary | ICD-10-CM

## 2015-03-09 DIAGNOSIS — C3491 Malignant neoplasm of unspecified part of right bronchus or lung: Secondary | ICD-10-CM

## 2015-03-09 DIAGNOSIS — C3431 Malignant neoplasm of lower lobe, right bronchus or lung: Secondary | ICD-10-CM

## 2015-03-09 LAB — POCT INR: INR: 2.4

## 2015-03-09 NOTE — Progress Notes (Signed)
[address]       [place] [address]             [phone]       HPI:  Timothy Guerrero returns today for a scheduled follow-up visit.  He is a 74 year old man who had a thoracoscopic right upper lobectomy for stage IA non-small cell carcinoma in June. He saw Dr. Whitney Muse. He does not require adjuvant chemotherapy.  He feels well. He's not having any incisional pain. He does have some laxity of the abdominal musculature in the right upper quadrant. Overall he feels well.  He recently saw Dr. Percival Spanish regarding his atrial flutter. He has switched from Eliquis to warfarin due to cost.  Past Medical History  Diagnosis Date  . Diabetes mellitus without complication     10 years  . Hypertension     10 years  . Hypercholesteremia   . Colon polyp     2007, polyp with early cancer, one year f/u no residual polyp. next TCS 2010, see PSH. [facility].  . Skin melanoma   . Sleep apnea     wears CPAP sometimes  . Shortness of breath dyspnea     due to lung mass  . HOH (hard of hearing)   . Prostate cancer   . Atrial flutter     post op following lobectomy  . Lung cancer       Current Outpatient Prescriptions  Medication Sig Dispense Refill  . Cholecalciferol 1000 UNITS TBDP Take 1,000 Int'l Units by mouth daily.    . cloNIDine (CATAPRES) 0.2 MG tablet Take 0.2 mg by mouth 2 (two) times daily.    [name] glipiZIDE (GLUCOTROL) 10 MG tablet Take 10 mg by mouth daily.    . hydrochlorothiazide (HYDRODIURIL) 25 MG tablet Take 1 tablet (25 mg total) by mouth daily. 90 tablet 3  . insulin glargine (LANTUS) 100 UNIT/ML injection Inject 50 Units into the skin daily. Takes in the morning    . levothyroxine (SYNTHROID, LEVOTHROID) 50 MCG tablet Take 1 tablet (50 mcg total) by mouth daily. 30 tablet 3  . lisinopril (PRINIVIL,ZESTRIL) 40 MG tablet Take 40 mg by mouth daily.    . metFORMIN (GLUCOPHAGE) 1000 MG tablet Take 1,000 mg by mouth 2 (two) times daily.     .  metoprolol succinate (TOPROL-XL) 100 MG 24 hr tablet Take 100 mg by mouth daily.     [name] tiotropium (SPIRIVA) 18 MCG inhalation capsule Place 18 mcg into inhaler and inhale daily.    [name] warfarin (COUMADIN) 5 MG tablet Take 1 tablet (5 mg total) by mouth daily. 30 tablet 3   No current facility-administered medications for this visit.    Physical Exam BP 103/67 mmHg  Pulse 98  Resp 16  Ht '5\' 11"'$  (1.803 m)  Wt 216 lb (97.977 kg)  BMI 30.14 kg/m2  SpO33 5% 74 year old man in no acute distress Well-developed and well-nourished Alert and oriented 3 with no focal deficits Cardiac regular rate and rhythm, no murmur Lungs diminished breath sounds right base, otherwise clear Incisions well healed  Diagnostic Tests: I personally reviewed his chest x-ray. Shows postoperative changes. There is no evidence recurrent disease.  Impression: Timothy Guerrero is a 74 year old gentleman who had a thoracoscopic right upper lobectomy for stage IA non-small cell carcinoma in June 2016. He is recovering from surgery without any significant issues.  He is being followed by Dr. Percival Spanish for atrial flutter and is seen  him recently. He is on warfarin for that.  He saw Dr. Whitney Muse. He has a follow-up appointment scheduled with her.  Plan: I will plan to see Timothy Guerrero back in 9 months for a 1 year follow-up visit after he has the CT scan done up in [place].  Melrose Nakayama, MD [facility] [phone]

## 2015-03-31 ENCOUNTER — Encounter: Payer: Self-pay | Admitting: Pharmacist

## 2015-03-31 ENCOUNTER — Ambulatory Visit (INDEPENDENT_AMBULATORY_CARE_PROVIDER_SITE_OTHER): Payer: Commercial Managed Care - HMO | Admitting: Pharmacist

## 2015-03-31 DIAGNOSIS — Z23 Encounter for immunization: Secondary | ICD-10-CM | POA: Diagnosis not present

## 2015-03-31 DIAGNOSIS — I4892 Unspecified atrial flutter: Secondary | ICD-10-CM

## 2015-03-31 LAB — POCT INR: INR: 2.4

## 2015-03-31 NOTE — Patient Instructions (Signed)
Anticoagulation Dose Instructions as of 03/31/2015      Timothy Guerrero Tue Wed Thu Fri Sat   New Dose 5 mg 5 mg 5 mg 5 mg 5 mg 5 mg 5 mg    Description        Continue warfarin '5mg'$  - take 1 tablet daily     INR was 2.4 today

## 2015-04-02 ENCOUNTER — Encounter: Payer: Self-pay | Admitting: Family Medicine

## 2015-04-02 ENCOUNTER — Ambulatory Visit (INDEPENDENT_AMBULATORY_CARE_PROVIDER_SITE_OTHER): Payer: Commercial Managed Care - HMO | Admitting: Family Medicine

## 2015-04-02 VITALS — BP 146/88 | HR 77 | Temp 97.4°F | Ht 72.0 in | Wt 214.4 lb

## 2015-04-02 DIAGNOSIS — S39012A Strain of muscle, fascia and tendon of lower back, initial encounter: Secondary | ICD-10-CM | POA: Diagnosis not present

## 2015-04-02 MED ORDER — MELOXICAM 7.5 MG PO TABS: 7.5000 mg | ORAL_TABLET | Freq: Every day | ORAL | Status: DC

## 2015-04-02 MED ORDER — TIZANIDINE HCL 2 MG PO TABS: 2.0000 mg | ORAL_TABLET | Freq: Three times a day (TID) | ORAL | Status: DC | PRN

## 2015-04-02 MED ORDER — PREDNISONE 20 MG PO TABS: ORAL_TABLET | ORAL | Status: DC

## 2015-04-02 NOTE — Progress Notes (Signed)
BP 146/88 mmHg  Pulse 77  Temp(Src) 97.4 F (36.3 C) (Oral)  Ht 6' (1.829 m)  Wt 214 lb 6.4 oz (97.251 kg)  BMI 29.07 kg/m2   Subjective:    Patient ID: Timothy Guerrero, male    DOB: May 18, 1941, 74 y.o.   MRN: [id number]  HPI: Timothy Guerrero is a 74 y.o. male presenting on 04/02/2015 for Back Pain   HPI Low back pain Patient presents today with a 2 day history of low back pain. He feels like he hurt himself while helping somebody move and lifting something. The pain is midline lower lumbar and left-sided paraspinal per patient. He denies any numbness, weakness, tingling in the lower extremities. He denies any fevers or chills. He has been having a lot of difficulties sleeping because of pain and took a friend's hydrocodone to help last night so that he could sleep. He had a pinched nerve in his lower back a long time ago but has not had any other issues with his back. Pain is described as a 5 out of 10 and nonradiating. He denies loss of bowel or bladder function. Improved with standing and walking and worsened with prolonged sitting.  Relevant past medical, surgical, family and social history reviewed and updated as indicated. Interim medical history since our last visit reviewed. Allergies and medications reviewed and updated.  Review of Systems  Constitutional: Negative for fever.  HENT: Negative for ear discharge and ear pain.   Eyes: Negative for discharge and visual disturbance.  Respiratory: Negative for shortness of breath and wheezing.   Cardiovascular: Negative for chest pain and leg swelling.  Gastrointestinal: Negative for abdominal pain, diarrhea and constipation.  Genitourinary: Negative for difficulty urinating.  Musculoskeletal: Positive for back pain. Negative for gait problem.  Skin: Negative for rash.  Neurological: Negative for syncope, weakness, light-headedness, numbness and headaches.  All other systems reviewed and are negative.   Per HPI unless  specifically indicated above     Medication List       This list is accurate as of: 04/02/15  2:27 PM.  Always use your most recent med list.               Cholecalciferol 1000 UNITS Tbdp  Take 1,000 Int'l Units by mouth daily.     cloNIDine 0.2 MG tablet  Commonly known as:  CATAPRES  Take 0.2 mg by mouth 2 (two) times daily.     glipiZIDE 10 MG tablet  Commonly known as:  GLUCOTROL  Take 10 mg by mouth daily.     hydrochlorothiazide 25 MG tablet  Commonly known as:  HYDRODIURIL  Take 1 tablet (25 mg total) by mouth daily.     insulin glargine 100 UNIT/ML injection  Commonly known as:  LANTUS  Inject 50 Units into the skin daily. Takes in the morning     levothyroxine 50 MCG tablet  Commonly known as:  SYNTHROID, LEVOTHROID  Take 1 tablet (50 mcg total) by mouth daily.     lisinopril 40 MG tablet  Commonly known as:  PRINIVIL,ZESTRIL  Take 40 mg by mouth daily.     meloxicam 7.5 MG tablet  Commonly known as:  MOBIC  Take 1 tablet (7.5 mg total) by mouth daily.     metFORMIN 1000 MG tablet  Commonly known as:  GLUCOPHAGE  Take 1,000 mg by mouth 2 (two) times daily.     metoprolol succinate 100 MG 24 hr tablet  Commonly known as:  TOPROL-XL  Take 100 mg by mouth daily.     predniSONE 20 MG tablet  Commonly known as:  DELTASONE  2 po at same time daily for 5 days     tiotropium 18 MCG inhalation capsule  Commonly known as:  SPIRIVA  Place 18 mcg into inhaler and inhale daily.     tiZANidine 2 MG tablet  Commonly known as:  ZANAFLEX  Take 1 tablet (2 mg total) by mouth every 8 (eight) hours as needed for muscle spasms.     warfarin 5 MG tablet  Commonly known as:  COUMADIN  Take 1 tablet (5 mg total) by mouth daily.           Objective:    BP 146/88 mmHg  Pulse 77  Temp(Src) 97.4 F (36.3 C) (Oral)  Ht 6' (1.829 m)  Wt 214 lb 6.4 oz (97.251 kg)  BMI 29.07 kg/m2  Wt Readings from Last 3 Encounters:  04/02/15 214 lb 6.4 oz (97.251 kg)    03/09/15 216 lb (97.977 kg)  02/15/15 218 lb 3.2 oz (98.975 kg)    Physical Exam  Constitutional: He is oriented to person, place, and time. He appears well-developed and well-nourished. No distress.  Eyes: Conjunctivae and EOM are normal. Right eye exhibits no discharge. No scleral icterus.  Cardiovascular: Normal rate, regular rhythm, normal heart sounds and intact distal pulses.   No murmur heard. Pulmonary/Chest: Effort normal and breath sounds normal. No respiratory distress. He has no wheezes.  Musculoskeletal: Normal range of motion. He exhibits no edema.       Lumbar back: He exhibits tenderness (midline low back tenderness around L4-L5 and left paraspinal tenderness in same region.). He exhibits normal range of motion, no swelling, no edema and no deformity.  Neurological: He is alert and oriented to person, place, and time. Coordination normal.  Skin: Skin is warm and dry. No rash noted. He is not diaphoretic. No erythema.  Psychiatric: He has a normal mood and affect. His behavior is normal.  Vitals reviewed.   Results for orders placed or performed in visit on 03/31/15  POCT INR  Result Value Ref Range   INR 2.4       Assessment & Plan:   Problem List Items Addressed This Visit    None    Visit Diagnoses    Low back strain, initial encounter    -  Primary    Acute low back pain 2 days, no radicular symptoms we will give Mobic, prednisone, Flexeril. Recheck INR in 2 weeks    Relevant Medications    predniSONE (DELTASONE) 20 MG tablet    meloxicam (MOBIC) 7.5 MG tablet    tiZANidine (ZANAFLEX) 2 MG tablet      patient was instructed not to start Mobic until after prednisone was finished.  Follow up plan: Return if symptoms worsen or fail to improve.  Caryl Pina, MD [facility] 04/02/2015, 2:27 PM

## 2015-04-07 ENCOUNTER — Telehealth: Payer: Self-pay | Admitting: Family Medicine

## 2015-04-07 NOTE — Telephone Encounter (Signed)
Pt's back is better after taking steroid med, instructed to take the mobic for a couple days, then if back is still good not to take anything else. To let us know how he is doing.

## 2015-04-21 ENCOUNTER — Encounter (HOSPITAL_COMMUNITY): Payer: Commercial Managed Care - HMO | Attending: Hematology & Oncology | Admitting: Hematology & Oncology

## 2015-04-21 ENCOUNTER — Encounter (HOSPITAL_COMMUNITY): Payer: Self-pay | Admitting: Hematology & Oncology

## 2015-04-21 ENCOUNTER — Encounter (HOSPITAL_BASED_OUTPATIENT_CLINIC_OR_DEPARTMENT_OTHER): Payer: Commercial Managed Care - HMO

## 2015-04-21 VITALS — BP 160/74 | HR 90 | Temp 98.4°F | Resp 18 | Wt 214.8 lb

## 2015-04-21 DIAGNOSIS — Z87891 Personal history of nicotine dependence: Secondary | ICD-10-CM

## 2015-04-21 DIAGNOSIS — Z902 Acquired absence of lung [part of]: Secondary | ICD-10-CM | POA: Diagnosis not present

## 2015-04-21 DIAGNOSIS — C3491 Malignant neoplasm of unspecified part of right bronchus or lung: Secondary | ICD-10-CM

## 2015-04-21 DIAGNOSIS — J929 Pleural plaque without asbestos: Secondary | ICD-10-CM | POA: Diagnosis not present

## 2015-04-21 DIAGNOSIS — C3431 Malignant neoplasm of lower lobe, right bronchus or lung: Secondary | ICD-10-CM | POA: Diagnosis not present

## 2015-04-21 DIAGNOSIS — Z9889 Other specified postprocedural states: Secondary | ICD-10-CM | POA: Insufficient documentation

## 2015-04-21 DIAGNOSIS — R918 Other nonspecific abnormal finding of lung field: Secondary | ICD-10-CM | POA: Diagnosis not present

## 2015-04-21 LAB — COMPREHENSIVE METABOLIC PANEL
ALT: 18 U/L (ref 17–63)
AST: 18 U/L (ref 15–41)
Albumin: 3.6 g/dL (ref 3.5–5.0)
Alkaline Phosphatase: 58 U/L (ref 38–126)
Anion gap: 6 (ref 5–15)
BUN: 13 mg/dL (ref 6–20)
CO2: 30 mmol/L (ref 22–32)
Calcium: 9.6 mg/dL (ref 8.9–10.3)
Chloride: 102 mmol/L (ref 101–111)
Creatinine, Ser: 0.87 mg/dL (ref 0.61–1.24)
GFR calc Af Amer: >60 mL/min (ref >60)
GFR calc non Af Amer: >60 mL/min (ref >60)
Glucose, Bld: 257 mg/dL — ABNORMAL HIGH (ref 65–99)
Potassium: 3.9 mmol/L (ref 3.5–5.1)
Sodium: 138 mmol/L (ref 135–145)
Total Bilirubin: 0.4 mg/dL (ref 0.3–1.2)
Total Protein: 6.7 g/dL (ref 6.5–8.1)

## 2015-04-21 LAB — CBC WITH DIFFERENTIAL/PLATELET
Basophils Absolute: 0 10*3/uL (ref 0.0–0.1)
Basophils Relative: 0 %
Eosinophils Absolute: 0.3 10*3/uL (ref 0.0–0.7)
Eosinophils Relative: 4 %
HCT: 38.5 % — ABNORMAL LOW (ref 39.0–52.0)
Hemoglobin: 12.1 g/dL — ABNORMAL LOW (ref 13.0–17.0)
Lymphocytes Relative: 16 %
Lymphs Abs: 1.3 10*3/uL (ref 0.7–4.0)
MCH: 26.5 pg (ref 26.0–34.0)
MCHC: 31.4 g/dL (ref 30.0–36.0)
MCV: 84.2 fL (ref 78.0–100.0)
Monocytes Absolute: 0.6 10*3/uL (ref 0.1–1.0)
Monocytes Relative: 7 %
Neutro Abs: 6.1 10*3/uL (ref 1.7–7.7)
Neutrophils Relative %: 73 %
Platelets: 243 10*3/uL (ref 150–400)
RBC: 4.57 MIL/uL (ref 4.22–5.81)
RDW: 16.9 % — ABNORMAL HIGH (ref 11.5–15.5)
WBC: 8.4 10*3/uL (ref 4.0–10.5)

## 2015-04-21 NOTE — Patient Instructions (Signed)
..  [facility] at [facility] Discharge Instructions  RECOMMENDATIONS MADE BY THE CONSULTANT AND ANY TEST RESULTS WILL BE SENT TO YOUR REFERRING PHYSICIAN.  Return in 4 months CT scans in December   Thank you for choosing [place] at [facility] to provide your oncology and hematology care.  To afford each patient quality time with our provider, please arrive at least 15 minutes before your scheduled appointment time.    You need to re-schedule your appointment should you arrive 10 or more minutes late.  We strive to give you quality time with our providers, and arriving late affects you and other patients whose appointments are after yours.  Also, if you no show three or more times for appointments you may be dismissed from the clinic at the providers discretion.     Again, thank you for choosing [facility].  Our hope is that these requests will decrease the amount of time that you wait before being seen by our physicians.       _____________________________________________________________  Should you have questions after your visit to [facility], please contact our office at [phone] between the hours of 8:30 a.m. and 4:30 p.m.  Voicemails left after 4:30 p.m. will not be returned until the following business day.  For prescription refill requests, have your pharmacy contact our office.

## 2015-04-21 NOTE — Progress Notes (Signed)
[facility] NOTE  Patient Care Team: Timothy Euler, MD as PCP - General (Family Medicine) Timothy Dolin, MD as Consulting Physician (Gastroenterology)  CHIEF COMPLAINTS/PURPOSE OF CONSULTATION:  CT of the chest on 09/09/2014 at [facility] with stable 2 cm adrenal nodule, felt to be an adrenal adenoma on the right, pleural plaques, calcified indicating asbestos related pleural disease, 2.2 cm irregular nodule in the superior segment of the right lower lobe  PET/CT reported as showing mass in RL was hypermetabolic (not available) PET/CT showed the right lower lobe mass was hyper metabolic with an SUV of 68.3. 1 cm right paratracheal node with a fatty hilum and SUV max of 3.8. No other suspicious findings  PFT's with normal FVC and FEV1 and DLCO of 60% Remote history of tobacco abuse History of multiple melanomas Prostate Cancer in 2001, s/p prostatectomy Cardiolite in 03/2014 low risk for ischemia  R vats,  R pleural biopsy, RLL superior segmentectomy, Thorascopic RL lobectomy, Mediastinal LN dissection Poorly differentiated Squamous cell carcinoma, T1b, N0 M0  HISTORY OF PRESENTING ILLNESS:  Timothy Guerrero 74 y.o. male is here because of newly diagnosed Stage IB squamous cell carcinoma of the R lung.  He has done well since surgery.  He has minimal to no post-operative pain.  The patient is here alone today and prefers to go by Timothy Guerrero.  He states that he has had his flu shot with his PCP.  Timothy Guerrero says he's been feeling okay, and eating okay. He confirms that he's been staying "out of trouble" and that his breathing has been well. He says his breathing used to be short but it's gotten better. He says he hasn't had many problems.  Timothy Guerrero states that he hasn't really been out much, but he has been fishing; but he notes he hasn't golfed in over a year.  He denies being worried about anything, and also denies smoking, drinking  ETOH.  He denies any headaches and blurry vision. He has ongoing follow-up with Timothy Guerrero.                               MEDICAL HISTORY:  Past Medical History  Diagnosis Date  . Diabetes mellitus without complication ([place])     10 years  . Hypertension     10 years  . Hypercholesteremia   . Colon polyp     2007, polyp with early cancer, one year f/u no residual polyp. next TCS 2010, see PSH. [facility].  . Skin melanoma ([place])   . Sleep apnea     wears CPAP sometimes  . Shortness of breath dyspnea     due to lung mass  . HOH (hard of hearing)   . Prostate cancer ([place])   . Atrial flutter ([place])     post op following lobectomy  . Lung cancer [facility])     SURGICAL HISTORY: Past Surgical History  Procedure Laterality Date  . Prostate surgery    . Knee replacements      bilateral. over 10 years ago  . Neck surgery    . Total hip arthroplasty  2007    left  . Total shoulder replacement  2011    left  . Colonoscopy  08/2008    Dr. Leafy Guerrero, [place]: normal, internal grade 1 hemorrhoids. next TCS 08/2013  . Colonoscopy  N/A 03/04/2014    Procedure: COLONOSCOPY;  Surgeon: Timothy Dolin, MD;  [facility];  Service: Endoscopy;  Laterality: N/A;  7:30am  . Cardiac catheterization      no PCI  . Video assisted thoracoscopy (vats)/wedge resection Right 12/17/2014    Procedure: RIGHT VIDEO ASSISTED THORACOSCOPY (VATS);  Surgeon: Timothy Nakayama, MD;  Location: [facility];  Service: Thoracic;  Laterality: Right;  . Lobectomy Right 12/17/2014    Procedure: RIGHT LOWER LOBECTOMY;  Surgeon: Timothy Nakayama, MD;  Location: [facility];  Service: Thoracic;  Laterality: Right;  . Lymph node dissection Right 12/17/2014    Procedure: LYMPH NODE DISSECTION;  Surgeon: Timothy Nakayama, MD;  Location: [facility];  Service: Thoracic;  Laterality: Right;  . Bottom lobe of right lung removed  12/17/14    SOCIAL HISTORY: Social History   Social  History  . Marital Status: Divorced    Spouse Name: N/A  . Number of Children: 3  . Years of Education: N/A   Occupational History  . retired    Social History Main Topics  . Smoking status: Former Smoker -- 2.00 packs/day    Types: Cigarettes    Quit date: 07/03/1986  . Smokeless tobacco: Not on file  . Alcohol Use: No  . Drug Use: No  . Sexual Activity: Not on file   Other Topics Concern  . Not on file   Social History Narrative   [employer].  Lives alone.  3 children, 1 passed from prostate cancer at age 62 16 grandchildren Ex-smoker. Started at 19, stopped in 1988.  2.5 ppd  FAMILY HISTORY: Family History  Problem Relation Age of Onset  . Colon cancer Neg Hx   . Cancer Mother     unknown type  . COPD Sister   . Breast cancer Sister   . Prostate cancer Son     died age 60   has no family status information on file.  Mother deceased, 40, possibly cancer Father deceased, 83, car wreck 2 sisters both deceased; youngest at 48, breast cancer and oldest at 63, COPD 1 brother living  ALLERGIES:  has No Known Allergies.  MEDICATIONS:  Current Outpatient Prescriptions  Medication Sig Dispense Refill  . Cholecalciferol 1000 UNITS TBDP Take 1,000 Int'l Units by mouth daily.    . cloNIDine (CATAPRES) 0.2 MG tablet Take 0.2 mg by mouth 2 (two) times daily.    [name] glipiZIDE (GLUCOTROL) 10 MG tablet Take 10 mg by mouth daily.    . hydrochlorothiazide (HYDRODIURIL) 25 MG tablet Take 1 tablet (25 mg total) by mouth daily. 90 tablet 3  . insulin glargine (LANTUS) 100 UNIT/ML injection Inject 50 Units into the skin daily. Takes in the morning    . levothyroxine (SYNTHROID, LEVOTHROID) 50 MCG tablet Take 1 tablet (50 mcg total) by mouth daily. 30 tablet 3  . lisinopril (PRINIVIL,ZESTRIL) 40 MG tablet Take 40 mg by mouth daily.    . meloxicam (MOBIC) 7.5 MG tablet Take 1 tablet (7.5 mg total) by mouth daily. 30 tablet 0  . metFORMIN (GLUCOPHAGE) 1000 MG tablet Take 1,000 mg by mouth 2  (two) times daily.     . metoprolol succinate (TOPROL-XL) 100 MG 24 hr tablet Take 100 mg by mouth daily.     [name] tiotropium (SPIRIVA) 18 MCG inhalation capsule Place 18 mcg into inhaler and inhale daily.    [name] warfarin (COUMADIN) 5 MG tablet Take 1 tablet (5 mg total) by mouth daily. 30 tablet 3  No current facility-administered medications for this visit.    Review of Systems  Constitutional: Negative.   HENT: Negative.   Eyes: Negative.   Respiratory: Negative.   Cardiovascular: Positive for chest pain. Negative for palpitations, orthopnea, claudication, leg swelling and PND.       During self-exertion    Gastrointestinal: Negative.   Genitourinary: Negative.   Musculoskeletal: Positive for joint pain.  Skin: Negative.   Neurological: Negative.   Endo/Heme/Allergies: Negative.   Psychiatric/Behavioral: Negative.   All other systems reviewed and are negative.  14 point ROS was done and is otherwise as detailed above or in HPI    PHYSICAL EXAMINATION: ECOG PERFORMANCE STATUS: 0 - Asymptomatic  Filed Vitals:   04/21/15 1356  BP: 160/74  Pulse: 90  Temp: 98.4 F (36.9 C)  Resp: 18   Filed Weights   04/21/15 1356  Weight: 214 lb 12.8 oz (97.433 kg)    Physical Exam  Constitutional: He is oriented to person, place, and time and well-developed, well-nourished, and in no distress.  HENT:  Head: Normocephalic and atraumatic.  Right Ear: External ear normal.  Left Ear: External ear normal.  Nose: Nose normal.  Mouth/Throat: Oropharynx is clear and moist. No oropharyngeal exudate.  Eyes: Conjunctivae and EOM are normal. Pupils are equal, round, and reactive to light. Right eye exhibits no discharge. Left eye exhibits no discharge. No scleral icterus.  Neck: Normal range of motion. Neck supple. No tracheal deviation present. No thyromegaly present.  Cardiovascular: Normal rate, regular rhythm and normal heart sounds.  Exam reveals no gallop and no friction rub.   No  murmur heard. Pulmonary/Chest: Effort normal and breath sounds normal. He has no wheezes. He has no rales.  Well healed surgical incision sites  Abdominal: Soft. Bowel sounds are normal. He exhibits no distension and no mass. There is no tenderness. There is no rebound and no guarding.  Musculoskeletal: Normal range of motion. He exhibits no edema.  Lymphadenopathy:    He has no cervical adenopathy.  Neurological: He is alert and oriented to person, place, and time. He has normal reflexes. No cranial nerve deficit. Gait normal. Coordination normal.  Skin: Skin is warm and dry. No rash noted.  Psychiatric: Mood, memory, affect and judgment normal.  Nursing note and vitals reviewed.   PATHOLOGY: FINAL DIAGNOSIS Diagnosis 1. Pleura, biopsy, right - FIBROSIS AND DYSTROPHIC CALCIFICATIONS. - THERE IS NO EVIDENCE OF MALIGNANCY. 2. Lymph node, biopsy, 8 - THERE IS NO EVIDENCE OF CARCINOMA IN 1 OF 1 LYMPH NODE (0/1). 3. Lung, wedge biopsy/resection, right lower lobe superior segment - INVASIVE SQUAMOUS CELL CARCINOMA, POORLY DIFFERENTIATED, SPANNING 2.8 CM. - CARCINOMA IS PRESENT AT THE SURGICAL RESECTION MARGIN OF SPECIMEN #3. - SEE ONCOLOGY TABLE BELOW. 4. Lung, resection (segmental or lobe), right lower lobe - BENIGN LUNG PARENCHYMA WITH HEMORRHAGE AND SCATTERED FOCI OF ANTHRACOSIS. - THERE IS NO EVIDENCE OF CARCINOMA IN ONE OF LYMPH NODE (0/1). - THERE IS NO EVIDENCE OF MALIGNANCY. 5. Pleura, biopsy, right #2 - FIBROSIS AND DYSTROPHIC CALCIFICATIONS. - THERE IS NO EVIDENCE OF MALIGNANCY. 6. Lymph node, biopsy, 12R - THERE IS NO EVIDENCE OF CARCINOMA IN 1 OF 1 LYMPH NODE (0/1) 7. Lymph node, biopsy, 12R #2 - THERE IS NO EVIDENCE OF CARCINOMA IN 1 OF 1 LYMPH NODE (0/1) 8. Lymph node, biopsy, 11R - THERE IS NO EVIDENCE OF CARCINOMA IN 1 OF 1 LYMPH NODE (0/1) 9. Lymph node, biopsy, 12R #3 - THERE IS NO EVIDENCE OF CARCINOMA IN 1  OF 1 LYMPH NODE (0/1) 10. Lymph node, biopsy, 12R #4 -  THERE IS NO EVIDENCE OF CARCINOMA IN 1 OF 1 LYMPH NODE (0/1) 1 of 4 Amended copy Amended FINAL for RYLEIGH, BUENGER ([id number]) Diagnosis(continued) 11. Lymph node, biopsy, 12R #5 - THERE IS NO EVIDENCE OF CARCINOMA IN 1 OF 1 LYMPH NODE (0/1) 12. Lymph node, biopsy, 4R - THERE IS NO EVIDENCE OF CARCINOMA IN 1 OF 1 LYMPH NODE (0/1) 13. Lymph node, biopsy, 10R - THERE IS NO EVIDENCE OF CARCINOMA IN 1 OF 1 LYMPH NODE (0/1) 14. Lymph node, biopsy, 4R #2 - THERE IS NO EVIDENCE OF CARCINOMA IN 1 OF 1 LYMPH NODE (0/1) 15. Lymph node, biopsy, 4R #3 - THERE IS NO EVIDENCE OF CARCINOMA IN 1 OF 1 LYMPH NODE (0/1) Microscopic Comment 3. LUNG Specimen, including laterality: Right lower lobe Procedure: Complete lobectomy Specimen integrity (intact/disrupted): Intact (with staples) Tumor site: Right lower lobe, subpleural Tumor focality: Unifocal Maximum tumor size (cm): 2.8 cm (gross measurement) Histologic type: Squamous cell carcinoma Grade: High grade (poorly differentiated) Margins: Negative for carcinoma Distance to closest margin (cm): At least 4.0 cm to margins (gross measurement) Visceral pleura invasion: Not identified Tumor extension: Confined to lung parenchyma Treatment effect (if treated with neoadjuvant therapy): N/A Lymph -Vascular invasion: Not identified Lymph nodes: Number examined - 12; Number N1 nodes positive 0; Number N2 nodes positive 0 TNM code: pT1b, pN0 Ancillary studies: Can be performed upon clinician request Non-neoplastic lung: Foci of acute inflammation with necrosis, anthracosis Enid Cutter MD Pathologist, Electronic Signature (Case signed 12/21/2014) Corrected Report Signer Enid Cutter MD Pathologist, Electronic Signature (Case signed 12/24/2014) 2 of 4 Amended copy Amended FINAL for DAVINDER, HAFF ([id number]) Intraoperative Diagnosis 1. RIGHT LUNG PLEURAL BIOPSY, FROZEN SECTION: FIBROSIS AND CALCIFICATIONS. (JBK) 2. LYMPH NODE, LEVEL 8, FROZEN  SECTION: ANTHRACOSIS. (JBK) 3. LUNG, RIGHT LOWER LOBE SUPERIOR SEGMENT, MARGIN, FROZEN SECTION: MALIGNANT (JBK) 4. LUNG, RIGHT LOBE RESECTION, BRONCHIAL MARGIN, FROZEN SECTION: NEGATIVE FOR MALIGNANCY. (JBK) Specimen Gross and Clinical Information Specimen(s) Obtained: 1. Pleura, biopsy, right 2. Lymph node, biopsy, 8 3. Lung, wedge biopsy/resection, right lower lobe superior segment 4. Lung, resection (segmental or lobe), right lower lobe 5. Pleura, biopsy, right #2 6. Lymph node, biopsy, 12R 7. Lymph node, biopsy, 12R #2 8. Lymph node, biopsy, 11R 9. Lymph node, biopsy, 12R #3 10. Lymph node, biopsy, 12R #4 11. Lymph node, biopsy, 12R #5 12. Lymph node, biopsy, 4R 13. Lymph node, biopsy, 10R 14. Lymph node, biopsy, 4R #2 15. Lymph node, biopsy, 4R #3 Specimen Clinical Information 1. right lower lobe lung mass (cm) Gross 1. Received fresh is a 2.4 cm in diameter and up to 0.2 cm thick gray white to vaguely yellow fibrous-like tissue with scattered nodularity and gritty cut surfaces. Representative sections are submitted in block A for frozen section, with remaining specimen submitted in block B for routine histology. 2. Received fresh is a 1.1 x 1 x 0.3 cm aggregate of tan to anthracotic soft tissue, submitted in one block for frozen section. 3. Received fresh is a 48 gram, 9 x 5 x 4 cm wedge of lung, clinically right lower lobe superior segment. The pleura is tan pink to red purple with scattered anthracosis, and few scattered adhesions. There are are three stapled areas, one of which has a suture and is clinically identified as closest margin. Found just beneath this stapled margin, and grossly involved with the staple line is a 2.8 x 2.6 x 2.3 cm tan  white to anthracotic firm ill defined mass. Separate from the mass is a second area of tan red vaguely nodular and indurated parenchyma, 1.4 x 1.1 x 0.9 cm. Tissue at the staple line clinically identified as closest margin is  submitted for frozen section in block A. Additional sections for routine histology are submitted as follows: B= mass. C= mass with overlying pleura. D= mass and and adjacent parenchyma. E= sections of nodular area of parenchyma separate from mass. Total 5 blocks. 4. Specimen: Right lower lobectomy. Specimen integrity (intact/incised/disrupted): There are staple lines over the superior segment. Size, weight: 198 grams, 11 x 9.6 x 4 cm. Pleura: Tan pink to hyperemic with moderate scattered anthracosis. Lesion: No residual lesion or mass is identified. Margin(s): Grossly free of lesions. 3 of 4 Amended copy Amended FINAL for JIMMY, PLESSINGER ([id number]) Gross(continued) Hilar vessels: Unremarkable. Nonneoplastic parenchyma: Tan red to dark red, spongy. Lymph nodes, level 12: At the hilum is a 0.8 cm soft anthracotic lymph node. Block Summary: A shave of the bronchial margin is submitted in block A for frozen section. Additional sections for routine histology are submitted as follows: B= vascular margins. C-E= tissue adjacent to stapled areas over superior segment. F= random parenchyma. G= one node, bisected. Total 7 blocks. 5. Received fresh are three irregular pieces of tan pink to yellow, soft to firm fibromembranous and fibrous-like tissues ranging from 1 x 0.6 x 0.2 cm to 3.1 x 2.5 x 0.5 cm. The firm areas have nodular tissues with gritty cut surfaces. Representative sections in one block. 6. Received fresh labeled 12R node, is a 1 cm soft to rubbery anthracotic node, bisected, one block. 7. Received fresh labeled 12R node #2, is a 0.6 cm soft anthracotic node, bisected, one block. 8. Received fresh labeled 11R node, is a 1.7 x 1 x 0.3 cm aggregate of anthracotic, soft to rubbery tissue, submitted in one block. 9. Received fresh labeled 12R node #3 is a 0.9 cm soft rubbery, anthracotic node which is sectioned and submitted in one block. 10. Received in fresh labeled 12R node #4  is a 1 x 0.8 x 0.3 cm aggregate of anthracotic soft to rubbery tissue, submitted in one block. 11. Received fresh labeled 12R node #5, is a 0.6 x 0.4 x 0.2 cm aggregate of anthracotic soft tissue, submitted in one block. 12. Received fresh labeled labeled 4R node, is a 0.9 cm gray white to anthracotic soft to rubbery node, bisected, one block. 13. Received fresh labeled 10R node, is a 1 x 0.9 x 0.3 cm tan yellow to anthracotic soft tissue in toto, one block. 14. Received fresh labeled 4R node# 2 is a 1.6 x 1.4 x 0.3 cm aggregate of tan yellow to anthracotic soft rubbery tissue, entirely submitted in one block. 15. Received fresh labeled 4R node #3 is a 1 cm soft rubbery, anthracotic node, bisected, one block. (SW:gt, 12/21/14) Report signed out from the following location(s) Technical Component was performed at [facility]. [address],[place] [id number].FIEP:[id number]., Interpretation was performed at [facility] [address], [place], [place] [address]. CLIA #: [id number], 4 of  LABORATORY DATA:  I have reviewed the data as listed Lab Results  Component Value Date   WBC 8.4 04/21/2015   HGB 12.1* 04/21/2015   HCT 38.5* 04/21/2015   MCV 84.2 04/21/2015   PLT 243 04/21/2015   CMP     Component Value Date/Time   NA 143 05/13/2015 1133   NA 138 04/21/2015  1313   K 4.1 05/13/2015 1133   CL 103 05/13/2015 1133   CO2 25 05/13/2015 1133   GLUCOSE 182* 05/13/2015 1133   GLUCOSE 257* 04/21/2015 1313   BUN 12 05/13/2015 1133   BUN 13 04/21/2015 1313   CREATININE 0.84 05/13/2015 1133   CALCIUM 9.5 05/13/2015 1133   PROT 6.5 05/13/2015 1133   PROT 6.7 04/21/2015 1313   ALBUMIN 4.1 05/13/2015 1133   ALBUMIN 3.6 04/21/2015 1313   AST 17 05/13/2015 1133   ALT 14 05/13/2015 1133   ALKPHOS 62 05/13/2015 1133   BILITOT 0.2 05/13/2015 1133   BILITOT 0.4 04/21/2015 1313   GFRNONAA 87 05/13/2015 1133   GFRAA 100 05/13/2015 1133       RADIOGRAPHIC STUDIES: I have personally reviewed the radiological images as listed and agreed with the findings in the report. CLINICAL DATA: Follow-up from right lower lobectomy and lymph node dissection on December 17, 2014, currently no symptoms  EXAM: CHEST 2 VIEW  COMPARISON: Chest x-ray of December 22, 2014  FINDINGS: The left lung is well-expanded. There is stable volume loss on the right. The pulmonary interstitial markings have improved and normalized. The right-sided pneumothorax is no longer evident. There is a small right pleural effusion which appears smaller. The heart and pulmonary vascularity are normal. The mediastinum is normal in width. There is a prosthetic left shoulder.  IMPRESSION: Interval resolution of mild pulmonary interstitial edema. Interval resolution of the small right pneumothorax and interval decrease in the small right pleural effusion.   Electronically Signed  By: David Martinique M.D.  On: 01/05/2015 11:47  ASSESSMENT & PLAN:  T1bN0M0 squamous cell carcinoma of the RLL Prior history of tobacco abuse Pleural plaques suggestive of prior asbestos exposure   He will be due for upcoming surveillance CT imaging of the chest. We will order this for him.  Moving forward I reviewed surveillance guidelines:  NCCN guidelines for Non-Small Cell Lung Cancer Surveillance are as follows:  A. H+P every 6-12 months and CT of chest every 6 months for the first two years  B. Low-dose spiral CT chest annually after first two years of surveillance CTs   C. PET scan not typically indicated for surveillance  D. Smoking cessation advice, counseling, and pharmacotherapy  We also addressed survivorship issues. He seems to be doing well recovering and emotionally is coping well. I advised him we will recommend an annual influenza vaccine, staying updated with his pneumococcal vaccination. It believes he obtains these through the [place].  I will see him back  in 3 months to regroup. Additional follow-up with be in accordance with guidelines as detailed above. He is to continue follow-up with Dr Timothy Guerrero as scheduled.   All questions were answered. The patient knows to call the clinic with any problems, questions or concerns.   This document serves as a record of services personally performed by Ancil Linsey, MD. It was created on her behalf by Toni Amend, a trained medical scribe. The creation of this record is based on the scribe's personal observations and the provider's statements to them. This document has been checked and approved by the attending provider.  I have reviewed the above documentation for accuracy and completeness, and I agree with the above.  This note was electronically signed.   Kelby Fam. Whitney Muse, MD

## 2015-04-23 NOTE — Progress Notes (Signed)
LABS DRAWN

## 2015-04-27 LAB — HM DIABETES EYE EXAM

## 2015-04-30 DIAGNOSIS — Z85820 Personal history of malignant melanoma of skin: Secondary | ICD-10-CM | POA: Diagnosis not present

## 2015-04-30 DIAGNOSIS — L821 Other seborrheic keratosis: Secondary | ICD-10-CM | POA: Diagnosis not present

## 2015-04-30 DIAGNOSIS — D1801 Hemangioma of skin and subcutaneous tissue: Secondary | ICD-10-CM | POA: Diagnosis not present

## 2015-04-30 DIAGNOSIS — L570 Actinic keratosis: Secondary | ICD-10-CM | POA: Diagnosis not present

## 2015-04-30 DIAGNOSIS — Z85828 Personal history of other malignant neoplasm of skin: Secondary | ICD-10-CM | POA: Diagnosis not present

## 2015-05-05 ENCOUNTER — Ambulatory Visit: Payer: Self-pay | Admitting: Pharmacist

## 2015-05-11 ENCOUNTER — Other Ambulatory Visit: Payer: Self-pay | Admitting: Family Medicine

## 2015-05-13 ENCOUNTER — Encounter: Payer: Self-pay | Admitting: Pharmacist

## 2015-05-13 ENCOUNTER — Ambulatory Visit (INDEPENDENT_AMBULATORY_CARE_PROVIDER_SITE_OTHER): Payer: Commercial Managed Care - HMO | Admitting: Pharmacist

## 2015-05-13 VITALS — BP 156/82 | HR 69 | Ht 72.0 in | Wt 218.5 lb

## 2015-05-13 DIAGNOSIS — E119 Type 2 diabetes mellitus without complications: Secondary | ICD-10-CM | POA: Diagnosis not present

## 2015-05-13 DIAGNOSIS — I4892 Unspecified atrial flutter: Secondary | ICD-10-CM | POA: Diagnosis not present

## 2015-05-13 DIAGNOSIS — I10 Essential (primary) hypertension: Secondary | ICD-10-CM

## 2015-05-13 DIAGNOSIS — Z0000 Encounter for general adult medical examination without abnormal findings: Secondary | ICD-10-CM

## 2015-05-13 DIAGNOSIS — Z1211 Encounter for screening for malignant neoplasm of colon: Secondary | ICD-10-CM

## 2015-05-13 DIAGNOSIS — Z794 Long term (current) use of insulin: Secondary | ICD-10-CM

## 2015-05-13 LAB — POCT INR: INR: 2

## 2015-05-13 MED ORDER — ACETAMINOPHEN 500 MG PO TABS: 500.0000 mg | ORAL_TABLET | Freq: Four times a day (QID) | ORAL | Status: DC | PRN

## 2015-05-13 NOTE — Progress Notes (Signed)
Patient ID: Timothy Guerrero, male   DOB: 1941/02/28, 74 y.o.   MRN: [id number]    Subjective:   Timothy Guerrero is a 74 y.o. divorced white male who presents for an Initial Medicare Annual Wellness Visit.  He is retired and was a Administrator for 25 years.  He lives in [place] by himself.  He is also due to have INR rechecked.  Patient BP is elevated in office today but is improved slightly from last visit.  He reports that he has episodes of hypotension and hypoglycemia after lobectomy and thoracopy with wedge resection in June 2016.      Current Medications (verified) Outpatient Encounter Prescriptions as of 05/13/2015  Medication Sig  . Cholecalciferol 1000 UNITS TBDP Take 1,000 Int'l Units by mouth daily.  . cloNIDine (CATAPRES) 0.2 MG tablet Take 0.2 mg by mouth 2 (two) times daily.  [name] glipiZIDE (GLUCOTROL) 10 MG tablet Take 10 mg by mouth daily.  . hydrochlorothiazide (HYDRODIURIL) 25 MG tablet Take 1 tablet (25 mg total) by mouth daily.  . insulin glargine (LANTUS) 100 UNIT/ML injection Inject 52-54 Units into the skin daily.  [name] levothyroxine (SYNTHROID, LEVOTHROID) 50 MCG tablet TAKE 1 TABLET EVERY DAY  . lisinopril (PRINIVIL,ZESTRIL) 40 MG tablet Take 40 mg by mouth every evening.  . metFORMIN (GLUCOPHAGE) 1000 MG tablet Take 1,000 mg by mouth 2 (two) times daily.   . metoprolol succinate (TOPROL-XL) 100 MG 24 hr tablet Take 100 mg by mouth daily.   [name] tiotropium (SPIRIVA) 18 MCG inhalation capsule Place 18 mcg into inhaler and inhale daily.  [name] warfarin (COUMADIN) 5 MG tablet TAKE 1 TABLET EVERY DAY  . acetaminophen (TYLENOL) 500 MG tablet Take 1 tablet (500 mg total) by mouth every 6 (six) hours as needed.  . [DISCONTINUED] meloxicam (MOBIC) 7.5 MG tablet Take 1 tablet (7.5 mg total) by mouth daily. (Patient not taking: Reported on 05/13/2015)   No facility-administered encounter medications on file as of 05/13/2015.    Allergies (verified) Review of patient's allergies  indicates no known allergies.   History: Past Medical History  Diagnosis Date  . Diabetes mellitus without complication ([place])     10 years  . Hypertension     10 years  . Hypercholesteremia   . Colon polyp     2007, polyp with early cancer, one year f/u no residual polyp. next TCS 2010, see PSH. [facility].  . Skin melanoma ([place])   . Sleep apnea     wears CPAP sometimes  . Shortness of breath dyspnea     due to lung mass  . HOH (hard of hearing)   . Prostate cancer ([place])   . Atrial flutter ([place])     post op following lobectomy  . Lung cancer ([place])   . Cataract   . Benign essential tremor    Past Surgical History  Procedure Laterality Date  . Prostate surgery    . Knee replacements      bilateral. over 10 years ago  . Neck surgery    . Total hip arthroplasty  2007    left  . Total shoulder replacement  2011    left  . Colonoscopy  08/2008    Dr. Leafy Half, [place]: normal, internal grade 1 hemorrhoids. next TCS 08/2013  . Colonoscopy N/A 03/04/2014    Procedure: COLONOSCOPY;  Surgeon: Daneil Dolin, MD;  [facility];  Service: Endoscopy;  Laterality: N/A;  7:30am  .  Cardiac catheterization      no PCI  . Video assisted thoracoscopy (vats)/wedge resection Right 12/17/2014    Procedure: RIGHT VIDEO ASSISTED THORACOSCOPY (VATS);  Surgeon: Melrose Nakayama, MD;  Location: [place];  Service: Thoracic;  Laterality: Right;  . Lobectomy Right 12/17/2014    Procedure: RIGHT LOWER LOBECTOMY;  Surgeon: Melrose Nakayama, MD;  Location: [place];  Service: Thoracic;  Laterality: Right;  . Lymph node dissection Right 12/17/2014    Procedure: LYMPH NODE DISSECTION;  Surgeon: Melrose Nakayama, MD;  Location: [facility];  Service: Thoracic;  Laterality: Right;  . Bottom lobe of right lung removed  12/17/14   Family History  Problem Relation Age of Onset  . Colon cancer Neg Hx   . Cancer Mother     unknown type  . COPD Sister   . Breast  cancer Sister   . Cancer Sister 22    breast  . Prostate cancer Son     died age 83   Social History   Occupational History  . retired    Social History Main Topics  . Smoking status: Former Smoker -- 2.00 packs/day    Types: Cigarettes    Quit date: 07/03/1986  . Smokeless tobacco: Never Used  . Alcohol Use: No  . Drug Use: No  . Sexual Activity: Yes    Do you feel safe at home?  Yes  Dietary issues and exercise activities discussed: Current Exercise Habits:: Structured exercise class, Type of exercise: strength training/weights, Time (Minutes): 45, Frequency (Times/Week): 3, Weekly Exercise (Minutes/Week): 135, Intensity: Moderate  Current Dietary habits:  Patient in not following any specific diet at this time.    Cardiac Risk Factors include: advanced age (>44men, >67 women);diabetes mellitus;dyslipidemia;hypertension;male gender  Objective:    Today's Vitals   05/13/15 1012  BP: 156/82  Pulse: 69  Height: 6' (1.829 m)  Weight: 218 lb 8 oz (99.111 kg)  PainSc: 2   PainLoc: Head   Body mass index is 29.63 kg/(m^2).   INR was 2.0 today in office   Activities of Daily Living In your present state of health, do you have any difficulty performing the following activities: 05/13/2015 12/18/2014  Hearing? Tempie Donning  Vision? Y N  Difficulty concentrating or making decisions? Y N  Walking or climbing stairs? N N  Dressing or bathing? N N  Doing errands, shopping? N N  Preparing Food and eating ? N -  Using the Toilet? N -  In the past six months, have you accidently leaked urine? N -  Do you have problems with loss of bowel control? N -  Managing your Medications? N -  Managing your Finances? N -  Housekeeping or managing your Housekeeping? N -    Are there smokers in your home (other than you)? No    Depression Screen PHQ 2/9 Scores 05/13/2015 04/02/2015 02/15/2015 07/06/2014  PHQ - 2 Score 0 0 5 0  PHQ- 9 Score - - 11 -    Fall Risk Fall Risk  05/13/2015  04/02/2015 02/15/2015 07/06/2014 12/31/2013  Falls in the past year? No No No No No    Cognitive Function: MMSE - Mini Mental State Exam 05/13/2015  Orientation to time 5  Orientation to Place 5  Registration 3  Attention/ Calculation 5  Recall 3  Language- name 2 objects 2  Language- repeat 1  Language- follow 3 step command 3  Language- read & follow direction 1  Write a sentence 0  Copy  design 0  Copy design-comments tremor make it difficult for patient to draw and write  Total score 28    Immunizations and Health Maintenance Immunization History  Administered Date(s) Administered  . Influenza,inj,Quad PF,36+ Mos 03/31/2015   Health Maintenance Due  Topic Date Due  . FOOT EXAM  05/25/1951  . OPHTHALMOLOGY EXAM  05/25/1951  . ZOSTAVAX  05/24/2001    Patient Care Team: [name], MD as PCP - General (Family Medicine) Daneil Dolin, MD as Consulting Physician (Gastroenterology) Amy Martinique, MD as Consulting Physician (Dermatology) Minus Breeding, MD as Consulting Physician (Cardiology)   Indicate any recent Medical Services you may have received from other than [facility] providers in the past year (date may be approximate). Patient also received some care from [place] in [place] and [place], [place]    Assessment:    Annual Wellness Visit  Therapeutic anticoagulation HTN  Screening Tests Health Maintenance  Topic Date Due  . FOOT EXAM  05/25/1951  . OPHTHALMOLOGY EXAM  05/25/1951  . ZOSTAVAX  05/24/2001  . PNA vac Low Risk Adult (1 of 2 - PCV13) 04/01/2016 (Originally 05/24/2006)  . HEMOGLOBIN A1C  08/18/2015  . INFLUENZA VACCINE  02/01/2016  . URINE MICROALBUMIN  02/01/2016  . COLONOSCOPY  03/04/2024  . TETANUS/TDAP  06/05/2024        Plan:   During the course of the visit Nikoloz was educated and counseled about the following appropriate screening and preventive services:   Vaccines to include:   Pneumoccal - due in Decemeber 2016   Influenza -  UTD  Tdap - UTD  Zostavax - declined  Colorectal cancer screening - FOBT given to return to office  Cardiovascular disease screening - UTD  Diabetes - increase lantus to 52 units once a day for 1 week - if FBG is still over 150 then increase to 54 units once a day.  Glaucoma screening /  Eye Exam - UTD - done at [place] exam - done at [facility] - patient is awaiting referral from them for podiatrist  Nutrition counseling - discussed limiting CHO and salt intake due to DM and HTN  Prostate cancer screening - N/a has prostectomy  HTN - change lisinopril $RemoveBeforeDEI'40mg'[id number]$  dosing to qpm  Physical activity - continue to visit gym 3 times per week.   RTC to recheck INR and BP in 1 month.  Anticoagulation Dose Instructions as of 05/13/2015      Dorene Grebe Tue Wed Thu Fri Sat   New Dose 5 mg 5 mg 5 mg 5 mg 5 mg 5 mg 5 mg    Description        Continue warfarin $RemoveBeforeDEI'5mg'[id number]$  - take 1 tablet daily     Orders Placed This Encounter  Procedures  . Fecal occult blood, imunochemical    Standing Status: Future     Number of Occurrences:      Standing Expiration Date: 07/12/2015  . Lipid panel  . CMP14+EGFR  . Microalbumin / creatinine urine ratio    Patient Instructions (the written plan) were given to the patient.   Cherre Robins, [facility]   05/13/2015

## 2015-05-13 NOTE — Patient Instructions (Addendum)
Anticoagulation Dose Instructions as of 05/13/2015      Dorene Grebe Tue Wed Thu Fri Sat   New Dose 5 mg 5 mg 5 mg 5 mg 5 mg 5 mg 5 mg    Description        Continue warfarin '5mg'$  - take 1 tablet daily     INR was 2.0 today   [name] , Thank you for taking time to come for your Medicare Wellness Visit. I appreciate your ongoing commitment to your health goals. Please review the following plan we discussed and let me know if I can assist you in the future.   These are the goals we discussed: Continue with exercise - great job! Increase Lantus to 52 units once a day for 1 week, if blood glucose in morning  is still over 150 after 1 week then increase to 54 units once a day. Change lisinopril '40mg'$  from taking 1 tablet in the morning to take in the evening.   This is a list of the screening recommended for you and due dates:  Health Maintenance  Topic Date Due  . Complete foot exam   Done at [facility] - 04/2015  . Eye exam for diabetics  Done at [facility] - 04/2015  . Shingles Vaccine  05/24/2001  . Pneumonia vaccines (1 of 2 - PCV13) 06/2015  . Hemoglobin A1C  08/18/2015  . Flu Shot  02/01/2016  . Urine Protein Check  Done today  . Colon Cancer Screening  03/04/2024  . Tetanus Vaccine  06/05/2024  *Topic was postponed. The date shown is not the original due date.

## 2015-05-14 LAB — CMP14+EGFR
ALT: 14 IU/L (ref 0–44)
AST: 17 IU/L (ref 0–40)
Albumin/Globulin Ratio: 1.7 (ref 1.1–2.5)
Albumin: 4.1 g/dL (ref 3.5–4.8)
Alkaline Phosphatase: 62 IU/L (ref 39–117)
BUN/Creatinine Ratio: 14 (ref 10–22)
BUN: 12 mg/dL (ref 8–27)
Bilirubin Total: 0.2 mg/dL (ref 0.0–1.2)
CO2: 25 mmol/L (ref 18–29)
Calcium: 9.5 mg/dL (ref 8.6–10.2)
Chloride: 103 mmol/L (ref 97–106)
Creatinine, Ser: 0.84 mg/dL (ref 0.76–1.27)
GFR calc Af Amer: 100 mL/min/{1.73_m2} (ref >59)
GFR calc non Af Amer: 87 mL/min/{1.73_m2} (ref >59)
Globulin, Total: 2.4 g/dL (ref 1.5–4.5)
Glucose: 182 mg/dL — ABNORMAL HIGH (ref 65–99)
Potassium: 4.1 mmol/L (ref 3.5–5.2)
Sodium: 143 mmol/L (ref 136–144)
Total Protein: 6.5 g/dL (ref 6.0–8.5)

## 2015-05-14 LAB — MICROALBUMIN / CREATININE URINE RATIO
Creatinine, Urine: 161.8 mg/dL
MICROALB/CREAT RATIO: 15.8 mg/g creat (ref 0.0–30.0)
Microalbumin, Urine: 25.6 ug/mL

## 2015-05-14 LAB — LIPID PANEL
Chol/HDL Ratio: 5.5 ratio units — ABNORMAL HIGH (ref 0.0–5.0)
Cholesterol, Total: 191 mg/dL (ref 100–199)
HDL: 35 mg/dL — ABNORMAL LOW (ref >39)
LDL Calculated: 116 mg/dL — ABNORMAL HIGH (ref 0–99)
Triglycerides: 199 mg/dL — ABNORMAL HIGH (ref 0–149)
VLDL Cholesterol Cal: 40 mg/dL (ref 5–40)

## 2015-05-19 ENCOUNTER — Other Ambulatory Visit: Payer: Commercial Managed Care - HMO

## 2015-05-19 DIAGNOSIS — Z1211 Encounter for screening for malignant neoplasm of colon: Secondary | ICD-10-CM | POA: Diagnosis not present

## 2015-05-19 NOTE — Progress Notes (Signed)
Lab only 

## 2015-05-24 LAB — FECAL OCCULT BLOOD, IMMUNOCHEMICAL: Fecal Occult Bld: NEGATIVE

## 2015-06-01 ENCOUNTER — Encounter: Payer: Self-pay | Admitting: Family Medicine

## 2015-06-01 ENCOUNTER — Ambulatory Visit (INDEPENDENT_AMBULATORY_CARE_PROVIDER_SITE_OTHER): Payer: Commercial Managed Care - HMO | Admitting: Family Medicine

## 2015-06-01 VITALS — BP 136/75 | HR 89 | Temp 97.0°F | Ht 73.83 in | Wt 217.6 lb

## 2015-06-01 DIAGNOSIS — B001 Herpesviral vesicular dermatitis: Secondary | ICD-10-CM | POA: Diagnosis not present

## 2015-06-01 DIAGNOSIS — E162 Hypoglycemia, unspecified: Secondary | ICD-10-CM | POA: Diagnosis not present

## 2015-06-01 DIAGNOSIS — E119 Type 2 diabetes mellitus without complications: Secondary | ICD-10-CM

## 2015-06-01 DIAGNOSIS — Z794 Long term (current) use of insulin: Secondary | ICD-10-CM

## 2015-06-01 DIAGNOSIS — I10 Essential (primary) hypertension: Secondary | ICD-10-CM | POA: Diagnosis not present

## 2015-06-01 DIAGNOSIS — E161 Other hypoglycemia: Secondary | ICD-10-CM

## 2015-06-01 LAB — GLUCOSE, POCT (MANUAL RESULT ENTRY): POC Glucose: 128 mg/dl — AB (ref 70–99)

## 2015-06-01 MED ORDER — VALACYCLOVIR HCL 1 G PO TABS: 2000.0000 mg | ORAL_TABLET | Freq: Two times a day (BID) | ORAL | Status: DC

## 2015-06-01 NOTE — Progress Notes (Signed)
   HPI  Patient presents today with hypoglycemic episode.  Patient's when this morning his blood sugar dropped to 46, is characterized by racing heart, fatigue, and weakness. He recovered with one half of a can of Dr. Malachi Bonds. He feels better currently He states that he recently increased his Lantus from 50 units to 52 units daily. His average fasting blood sugars 100-110 He skipped breakfast this morning  Requests refill of Valtrex for herpes labialis  PMH: Smoking status noted ROS: Per HPI  Objective: BP 136/75 mmHg  Pulse 89  Temp(Src) 97 F (36.1 C) (Oral)  Ht 6' 1.83" (1.875 m)  Wt 217 lb 9.6 oz (98.703 kg)  BMI 28.08 kg/m2 Gen: NAD, alert, cooperative with exam HEENT: NCAT, lower lip with fever blister present CV: RRR, good S1/S2, no murmur Resp: CTABL, no wheezes, non-labored Ext: No edema, warm Neuro: Alert and oriented, No gross deficits  Assessment and plan:  # Hypoglycemia, diabetes Most likely reason for the hypoglycemia was skipping breakfast while on basal insulin, he has had a recent increase in his Lantus, however it is very small. His fasting blood sugars are well controlled CBG today is 128 I gave him the choice of decreasing Lantus or trying to eat more regularly, he would like to eat more regularly If this happens again he will decrease his Lantus and let us know. He recovered easily incorrectly using a soda.  # Herpes labialis Refill Valtrex   Orders Placed This Encounter  Procedures  . POCT glucose (manual entry)     [name], MD [facility] 06/01/2015, 1:27 PM

## 2015-06-01 NOTE — Patient Instructions (Signed)
Great to see you!  You have had a hypoglycemic episide, your symptoms were because of low blood sugar.   If this happens again go back to 50 units of lantus daily and let us know  Be sure to eat more consistently  Keep a blood pressure log and come back in 1 month

## 2015-06-02 ENCOUNTER — Encounter: Payer: Self-pay | Admitting: *Deleted

## 2015-06-08 ENCOUNTER — Ambulatory Visit (HOSPITAL_COMMUNITY): Payer: Commercial Managed Care - HMO

## 2015-06-18 ENCOUNTER — Ambulatory Visit (INDEPENDENT_AMBULATORY_CARE_PROVIDER_SITE_OTHER): Payer: Commercial Managed Care - HMO | Admitting: Pharmacist

## 2015-06-18 ENCOUNTER — Ambulatory Visit (HOSPITAL_COMMUNITY): Payer: Commercial Managed Care - HMO

## 2015-06-18 ENCOUNTER — Encounter: Payer: Self-pay | Admitting: Pharmacist

## 2015-06-18 VITALS — BP 140/88 | HR 72 | Ht 73.0 in | Wt 216.0 lb

## 2015-06-18 DIAGNOSIS — E119 Type 2 diabetes mellitus without complications: Secondary | ICD-10-CM | POA: Diagnosis not present

## 2015-06-18 DIAGNOSIS — I4892 Unspecified atrial flutter: Secondary | ICD-10-CM

## 2015-06-18 DIAGNOSIS — Z794 Long term (current) use of insulin: Secondary | ICD-10-CM | POA: Diagnosis not present

## 2015-06-18 LAB — POCT INR: INR: 2.7

## 2015-06-18 NOTE — Progress Notes (Signed)
Patient ID: Timothy Guerrero, male   DOB: 1940/11/02, 74 y.o.   MRN: [id number]    Subjective:   Timothy Guerrero is a 74 y.o. divorced white male who presents for diabetes and INR recheck. He is also due to have INR rechecked.  He reports that he has recently moved and that he has not been checking BG.  He has also been eating out a lot more.  No current exercise  Review of Systems  Constitutional: Negative.   HENT: Negative.   Eyes: Negative.   Respiratory: Negative.   Cardiovascular: Negative.   Gastrointestinal: Negative.   Genitourinary: Negative.   Musculoskeletal: Negative.   Skin: Negative.   Neurological: Negative.   Endo/Heme/Allergies: Negative.   Psychiatric/Behavioral: Negative.     Current Medications (verified) Outpatient Encounter Prescriptions as of 06/18/2015  Medication Sig  . acetaminophen (TYLENOL) 500 MG tablet Take 1 tablet (500 mg total) by mouth every 6 (six) hours as needed.  . Cholecalciferol 1000 UNITS TBDP Take 1,000 Int'l Units by mouth daily.  . cloNIDine (CATAPRES) 0.2 MG tablet Take 0.2 mg by mouth 2 (two) times daily.  [name] glipiZIDE (GLUCOTROL) 10 MG tablet Take 10 mg by mouth daily.  . hydrochlorothiazide (HYDRODIURIL) 25 MG tablet Take 1 tablet (25 mg total) by mouth daily.  . insulin glargine (LANTUS) 100 UNIT/ML injection Inject 52-54 Units into the skin daily.  [name] levothyroxine (SYNTHROID, LEVOTHROID) 50 MCG tablet TAKE 1 TABLET EVERY DAY  . lisinopril (PRINIVIL,ZESTRIL) 40 MG tablet Take 40 mg by mouth every evening.  . metFORMIN (GLUCOPHAGE) 1000 MG tablet Take 1,000 mg by mouth 2 (two) times daily.   . metoprolol succinate (TOPROL-XL) 100 MG 24 hr tablet Take 100 mg by mouth daily.   [name] tiotropium (SPIRIVA) 18 MCG inhalation capsule Place 18 mcg into inhaler and inhale daily.  . valACYclovir (VALTREX) 1000 MG tablet Take 2 tablets (2,000 mg total) by mouth 2 (two) times daily. For 1 day for cold sores  . warfarin (COUMADIN) 5 MG tablet TAKE 1  TABLET EVERY DAY   No facility-administered encounter medications on file as of 06/18/2015.    Allergies (verified) Review of patient's allergies indicates no known allergies.   History: Past Medical History  Diagnosis Date  . Diabetes mellitus without complication ([place])     10 years  . Hypertension     10 years  . Hypercholesteremia   . Colon polyp     2007, polyp with early cancer, one year f/u no residual polyp. next TCS 2010, see PSH. [facility].  . Skin melanoma ([place])   . Sleep apnea     wears CPAP sometimes  . Shortness of breath dyspnea     due to lung mass  . HOH (hard of hearing)   . Prostate cancer ([place])   . Atrial flutter ([place])     post op following lobectomy  . Lung cancer ([place])   . Cataract   . Benign essential tremor    Past Surgical History  Procedure Laterality Date  . Prostate surgery    . Knee replacements      bilateral. over 10 years ago  . Neck surgery    . Total hip arthroplasty  2007    left  . Total shoulder replacement  2011    left  . Colonoscopy  08/2008    Dr. Leafy Half, [place]: normal, internal grade 1 hemorrhoids. next TCS 08/2013  . Colonoscopy N/A 03/04/2014  Procedure: COLONOSCOPY;  Surgeon: Daneil Dolin, MD;  [facility];  Service: Endoscopy;  Laterality: N/A;  7:30am  . Cardiac catheterization      no PCI  . Video assisted thoracoscopy (vats)/wedge resection Right 12/17/2014    Procedure: RIGHT VIDEO ASSISTED THORACOSCOPY (VATS);  Surgeon: Melrose Nakayama, MD;  Location: [facility];  Service: Thoracic;  Laterality: Right;  . Lobectomy Right 12/17/2014    Procedure: RIGHT LOWER LOBECTOMY;  Surgeon: Melrose Nakayama, MD;  Location: [place];  Service: Thoracic;  Laterality: Right;  . Lymph node dissection Right 12/17/2014    Procedure: LYMPH NODE DISSECTION;  Surgeon: Melrose Nakayama, MD;  Location: [place];  Service: Thoracic;  Laterality: Right;  . Bottom lobe of right lung  removed  12/17/14   Family History  Problem Relation Age of Onset  . Colon cancer Neg Hx   . Cancer Mother     unknown type  . COPD Sister   . Breast cancer Sister   . Cancer Sister 76    breast  . Prostate cancer Son     died age 19   Social History   Occupational History  . retired    Social History Main Topics  . Smoking status: Former Smoker -- 2.00 packs/day    Types: Cigarettes    Quit date: 07/03/1986  . Smokeless tobacco: Never Used  . Alcohol Use: No  . Drug Use: No  . Sexual Activity: Yes      Objective:    Today's Vitals   06/18/15 1033  BP: 140/88  Pulse: 72  Height: '6\' 1"'$  (1.854 m)  Weight: 216 lb (97.977 kg)   Body mass index is 28.5 kg/(m^2).   INR was 2.7 today in office  A1c was up to 8.2%      Assessment:    Uncontrolled type 2 DM Therapeutic anticoagulation HTN  Screening Tests Health Maintenance  Topic Date Due  . ZOSTAVAX  07/06/2015 (Originally 05/24/2001)  . PNA vac Low Risk Adult (1 of 2 - PCV13) 04/01/2016 (Originally 05/24/2006)  . HEMOGLOBIN A1C  08/18/2015  . INFLUENZA VACCINE  02/01/2016  . FOOT EXAM  04/28/2016  . OPHTHALMOLOGY EXAM  04/28/2016  . URINE MICROALBUMIN  05/12/2016  . COLONOSCOPY  03/04/2024  . TETANUS/TDAP  06/05/2024        Plan:   During the course of the visit Emilian was educated and counseled about the following appropriate screening and preventive services:   Vaccines to include:   Pneumoccal - due today however we are out of stock in clinic today - will administer when returns in January 2017.   Influenza - UTD  Tdap - UTD  Zostavax - declined  Diabetes - increase lantus to 54 units once a day   Restart checking BG daily - if gets BG over 200 then call office for further adjustment in Lantus dose  Nutrition counseling - Reviewed limiting CHO and salt intake due to DM and HTN  Physical activity - continue to visit gym 3 times per week.   RTC to recheck INR and BP in 1  month.  Anticoagulation Dose Instructions as of 06/18/2015      Dorene Grebe Tue Wed Thu Fri Sat   New Dose 5 mg 5 mg 5 mg 5 mg 5 mg 5 mg 5 mg    Description        Continue warfarin '5mg'$  - take 1 tablet daily     Orders Placed This Encounter  Procedures  . POCT glycosylated hemoglobin (Hb A1C)     Cherre Robins, [facility]   06/18/2015

## 2015-06-18 NOTE — Patient Instructions (Addendum)
Anticoagulation Dose Instructions as of 06/18/2015      Dorene Grebe Tue Wed Thu Fri Sat   New Dose 5 mg 5 mg 5 mg 5 mg 5 mg 5 mg 5 mg    Description        Continue warfarin '5mg'$  - take 1 tablet daily     INR was 2.7 today  Increase Lantus to 54 units once a day and restart checking blood glucose once or twice a day.   Diabetes and Standards of Medical Care   Diabetes is complicated. You may find that your diabetes team includes a dietitian, nurse, diabetes educator, eye doctor, and more. To help everyone know what is going on and to help you get the care you deserve, the following schedule of care was developed to help keep you on track. Below are the tests, exams, vaccines, medicines, education, and plans you will need.  Blood Glucose Goals Prior to meals = 80 - 130 Within 2 hours of the start of a meal = less than 180  HbA1c test (goal is less than 7.0% - your value today was 8.2%) This test shows how well you have controlled your glucose over the past 2 to 3 months. It is used to see if your diabetes management plan needs to be adjusted.   It is performed at least 2 times a year if you are meeting treatment goals.  It is performed 4 times a year if therapy has changed or if you are not meeting treatment goals.  Blood pressure test  This test is performed at every routine medical visit. The goal is less than 140/90 mmHg for most people, but 130/80 mmHg in some cases. Ask your health care provider about your goal.  Dental exam  Follow up with the dentist regularly.  Eye exam  If you are diagnosed with type 1 diabetes as a child, get an exam upon reaching the age of 67 years or older and have had diabetes for 3 to 5 years. Yearly eye exams are recommended after that initial eye exam.  If you are diagnosed with type 1 diabetes as an adult, get an exam within 5 years of diagnosis and then yearly.  If you are diagnosed with type 2 diabetes, get an exam as soon as possible after  the diagnosis and then yearly.  Foot care exam  Visual foot exams are performed at every routine medical visit. The exams check for cuts, injuries, or other problems with the feet.  A comprehensive foot exam should be done yearly. This includes visual inspection as well as assessing foot pulses and testing for loss of sensation.  Check your feet nightly for cuts, injuries, or other problems with your feet. Tell your health care provider if anything is not healing.  Kidney function test (urine microalbumin)  This test is performed once a year.  Type 1 diabetes: The first test is performed 5 years after diagnosis.  Type 2 diabetes: The first test is performed at the time of diagnosis.  A serum creatinine and estimated glomerular filtration rate (eGFR) test is done once a year to assess the level of chronic kidney disease (CKD), if present.  Lipid profile (cholesterol, HDL, LDL, triglycerides)  Performed every 5 years for most people.  The goal for LDL is less than 100 mg/dL. If you are at high risk, the goal is less than 70 mg/dL.  The goal for HDL is 40 mg/dL to 50 mg/dL for men and 50 mg/dL to  60 mg/dL for women. An HDL cholesterol of 60 mg/dL or higher gives some protection against heart disease.  The goal for triglycerides is less than 150 mg/dL.  Influenza vaccine, pneumococcal vaccine, and hepatitis B vaccine  The influenza vaccine is recommended yearly.  The pneumococcal vaccine is generally given once in a lifetime. However, there are some instances when another vaccination is recommended. Check with your health care provider.  The hepatitis B vaccine is also recommended for adults with diabetes.  Diabetes self-management education  Education is recommended at diagnosis and ongoing as needed.  Treatment plan  Your treatment plan is reviewed at every medical visit.  Document Released: 04/16/2009 Document Revised: 02/19/2013 Document Reviewed: 11/19/2012 [facility]  Patient Information 2014 [place].

## 2015-07-08 ENCOUNTER — Other Ambulatory Visit: Payer: Self-pay | Admitting: Family Medicine

## 2015-07-30 ENCOUNTER — Encounter: Payer: Self-pay | Admitting: Pharmacist

## 2015-07-30 ENCOUNTER — Ambulatory Visit (INDEPENDENT_AMBULATORY_CARE_PROVIDER_SITE_OTHER): Payer: Commercial Managed Care - HMO | Admitting: Pharmacist

## 2015-07-30 VITALS — BP 140/88 | HR 70 | Ht 73.0 in | Wt 215.5 lb

## 2015-07-30 DIAGNOSIS — E119 Type 2 diabetes mellitus without complications: Secondary | ICD-10-CM

## 2015-07-30 DIAGNOSIS — Z23 Encounter for immunization: Secondary | ICD-10-CM | POA: Diagnosis not present

## 2015-07-30 DIAGNOSIS — Z794 Long term (current) use of insulin: Secondary | ICD-10-CM | POA: Diagnosis not present

## 2015-07-30 DIAGNOSIS — I4892 Unspecified atrial flutter: Secondary | ICD-10-CM | POA: Diagnosis not present

## 2015-07-30 LAB — POCT INR: INR: 2.5

## 2015-07-30 NOTE — Patient Instructions (Addendum)
Anticoagulation Dose Instructions as of 07/30/2015      Dorene Grebe Tue Wed Thu Fri Sat   New Dose 5 mg 5 mg 5 mg 5 mg 5 mg 5 mg 5 mg    Description        Continue warfarin '5mg'$  - take 1 tablet daily     INR was 2.5 today   Pneumococcal Polysaccharide Vaccine: What You Need to Know 1. Why get vaccinated? Vaccination can protect older adults (and some children and younger adults) from pneumococcal disease. Pneumococcal disease is caused by bacteria that can spread from person to person through close contact. It can cause ear infections, and it can also lead to more serious infections of the:   Lungs (pneumonia),  Blood (bacteremia), and  Covering of the brain and spinal cord (meningitis). Meningitis can cause deafness and brain damage, and it can be fatal. Anyone can get pneumococcal disease, but children under 59 years of age, people with certain medical conditions, adults over 107 years of age, and cigarette smokers are at the highest risk. About 18,000 older adults die each year from pneumococcal disease in the [place]. Treatment of pneumococcal infections with penicillin and other drugs used to be more effective. But some strains of the disease have become resistant to these drugs. This makes prevention of the disease, through vaccination, even more important. 2. Pneumococcal polysaccharide vaccine (PPSV23) Pneumococcal polysaccharide vaccine (PPSV23) protects against 23 types of pneumococcal bacteria. It will not prevent all pneumococcal disease. PPSV23 is recommended for:  All adults 28 years of age and older,  Anyone 2 through 74 years of age with certain long-term health problems,  Anyone 2 through 75 years of age with a weakened immune system,  Adults 89 through 75 years of age who smoke cigarettes or have asthma. Most people need only one dose of PPSV. A second dose is recommended for certain high-risk groups. People 12 and older should get a dose even if they have gotten  one or more doses of the vaccine before they turned 65. Your healthcare provider can give you more information about these recommendations. Most healthy adults develop protection within 2 to 3 weeks of getting the shot. 3. Some people should not get this vaccine  Anyone who has had a life-threatening allergic reaction to PPSV should not get another dose.  Anyone who has a severe allergy to any component of PPSV should not receive it. Tell your provider if you have any severe allergies.  Anyone who is moderately or severely ill when the shot is scheduled may be asked to wait until they recover before getting the vaccine. Someone with a mild illness can usually be vaccinated.  Children less than 76 years of age should not receive this vaccine.  There is no evidence that PPSV is harmful to either a pregnant woman or to her fetus. However, as a precaution, women who need the vaccine should be vaccinated before becoming pregnant, if possible. 4. Risks of a vaccine reaction With any medicine, including vaccines, there is a chance of side effects. These are usually mild and go away on their own, but serious reactions are also possible. About half of people who get PPSV have mild side effects, such as redness or pain where the shot is given, which go away within about two days. Less than 1 out of 100 people develop a fever, muscle aches, or more severe local reactions. Problems that could happen after any vaccine:  People sometimes faint after a  medical procedure, including vaccination. Sitting or lying down for about 15 minutes can help prevent fainting, and injuries caused by a fall. Tell your doctor if you feel dizzy, or have vision changes or ringing in the ears.  Some people get severe pain in the shoulder and have difficulty moving the arm where a shot was given. This happens very rarely.  Any medication can cause a severe allergic reaction. Such reactions from a vaccine are very rare, estimated  at about 1 in a million doses, and would happen within a few minutes to a few hours after the vaccination. As with any medicine, there is a very remote chance of a vaccine causing a serious injury or death. The safety of vaccines is always being monitored. For more information, visit: http://www.aguilar.org/ 5. What if there is a serious reaction? What should I look for? Look for anything that concerns you, such as signs of a severe allergic reaction, very high fever, or unusual behavior.  Signs of a severe allergic reaction can include hives, swelling of the face and throat, difficulty breathing, a fast heartbeat, dizziness, and weakness. These would usually start a few minutes to a few hours after the vaccination. What should I do? If you think it is a severe allergic reaction or other emergency that can't wait, call 9-1-1 or get to the nearest hospital. Otherwise, call your doctor. Afterward, the reaction should be reported to the Vaccine Adverse Event Reporting System (VAERS). Your doctor might file this report, or you can do it yourself through the VAERS web site at www.vaers.SamedayNews.es, or by calling 219-210-4762.  VAERS does not give medical advice. 6. How can I learn more?  Ask your doctor. He or she can give you the vaccine package insert or suggest other sources of information.  Call your local or state health department.  Contact the Centers for Disease Control and Prevention (CDC):  Call 928-024-2636 (1-800-CDC-INFO) or  Visit CDC's website at http://hunter.com/ CDC Pneumococcal Polysaccharide Vaccine VIS (10/24/13)   This information is not intended to replace advice given to you by your health care provider. Make sure you discuss any questions you have with your health care provider.   Document Released: 04/16/2006 Document Revised: 07/10/2014 Document Reviewed: 10/27/2013 Elsevier Interactive Patient Education [employer].

## 2015-07-30 NOTE — Progress Notes (Signed)
Patient ID: [name], male   DOB: 1941/03/07, 75 y.o.   MRN: [id number]    Subjective:   Timothy Guerrero is a 75 y.o. divorced white male who presents for diabetes and INR recheck. He has type 2 diabetes requiring insulin.  He is currently taking Lantus 54 units once daily, metformin '1000mg'$  bid and glipizide '10mg'$  once daily.  He has been checking at home more frequently but he did not bring in glucometer or a record of BG readings from home.  He self reports that BG ranges from 140 to 170.  Timothy Guerrero is a member of gym and restarted going 3 times per week last week.   Current warfarin dose is '5mg'$  1 tablet daily Patient denies any s/s of bruising or bleeding  Review of Systems  Constitutional: Negative.   HENT: Negative.   Eyes: Negative.   Respiratory: Negative.   Cardiovascular: Negative.   Gastrointestinal: Negative.   Genitourinary: Negative.   Musculoskeletal: Negative.   Skin: Negative.   Neurological: Negative.   Endo/Heme/Allergies: Negative.   Psychiatric/Behavioral: Negative.     Current Medications (verified) Outpatient Encounter Prescriptions as of 07/30/2015  Medication Sig  . acetaminophen (TYLENOL) 500 MG tablet Take 1 tablet (500 mg total) by mouth every 6 (six) hours as needed.  . Cholecalciferol 1000 UNITS TBDP Take 1,000 Int'l Units by mouth daily.  . cloNIDine (CATAPRES) 0.2 MG tablet Take 0.2 mg by mouth 2 (two) times daily.  [name] glipiZIDE (GLUCOTROL) 10 MG tablet Take 10 mg by mouth daily.  . hydrochlorothiazide (HYDRODIURIL) 25 MG tablet Take 1 tablet (25 mg total) by mouth daily.  . insulin glargine (LANTUS) 100 UNIT/ML injection Inject 52-54 Units into the skin daily.  [name] levothyroxine (SYNTHROID, LEVOTHROID) 50 MCG tablet TAKE 1 TABLET EVERY DAY  . lisinopril (PRINIVIL,ZESTRIL) 40 MG tablet Take 40 mg by mouth every evening.  . metFORMIN (GLUCOPHAGE) 1000 MG tablet Take 1,000 mg by mouth 2 (two) times daily.   . metoprolol succinate (TOPROL-XL) 100 MG  24 hr tablet Take 100 mg by mouth daily.   [name] tiotropium (SPIRIVA) 18 MCG inhalation capsule Place 18 mcg into inhaler and inhale daily.  . valACYclovir (VALTREX) 1000 MG tablet Take 2 tablets (2,000 mg total) by mouth 2 (two) times daily. For 1 day for cold sores  . warfarin (COUMADIN) 5 MG tablet TAKE 1 TABLET EVERY DAY   No facility-administered encounter medications on file as of 07/30/2015.    Allergies (verified) Review of patient's allergies indicates no known allergies.   History: Past Medical History  Diagnosis Date  . Diabetes mellitus without complication ([place])     10 years  . Hypertension     10 years  . Hypercholesteremia   . Colon polyp     2007, polyp with early cancer, one year f/u no residual polyp. next TCS 2010, see PSH. [facility].  . Skin melanoma ([place])   . Sleep apnea     wears CPAP sometimes  . Shortness of breath dyspnea     due to lung mass  . HOH (hard of hearing)   . Prostate cancer ([place])   . Atrial flutter ([place])     post op following lobectomy  . Lung cancer ([place])   . Cataract   . Benign essential tremor    Past Surgical History  Procedure Laterality Date  . Prostate surgery    . Knee replacements      bilateral. over 10 years ago  .  Neck surgery    . Total hip arthroplasty  2007    left  . Total shoulder replacement  2011    left  . Colonoscopy  08/2008    Dr. Leafy Half, [place]: normal, internal grade 1 hemorrhoids. next TCS 08/2013  . Colonoscopy N/A 03/04/2014    Procedure: COLONOSCOPY;  Surgeon: Daneil Dolin, MD;  [facility];  Service: Endoscopy;  Laterality: N/A;  7:30am  . Cardiac catheterization      no PCI  . Video assisted thoracoscopy (vats)/wedge resection Right 12/17/2014    Procedure: RIGHT VIDEO ASSISTED THORACOSCOPY (VATS);  Surgeon: Melrose Nakayama, MD;  Location: [place];  Service: Thoracic;  Laterality: Right;  . Lobectomy Right 12/17/2014    Procedure: RIGHT LOWER  LOBECTOMY;  Surgeon: Melrose Nakayama, MD;  Location: [place];  Service: Thoracic;  Laterality: Right;  . Lymph node dissection Right 12/17/2014    Procedure: LYMPH NODE DISSECTION;  Surgeon: Melrose Nakayama, MD;  Location: [place];  Service: Thoracic;  Laterality: Right;  . Bottom lobe of right lung removed  12/17/14   Family History  Problem Relation Age of Onset  . Colon cancer Neg Hx   . Cancer Mother     unknown type  . COPD Sister   . Breast cancer Sister   . Cancer Sister 69    breast  . Prostate cancer Son     died age 91   Social History   Occupational History  . retired    Social History Main Topics  . Smoking status: Former Smoker -- 2.00 packs/day    Types: Cigarettes    Quit date: 07/03/1986  . Smokeless tobacco: Never Used  . Alcohol Use: No  . Drug Use: No  . Sexual Activity: Yes      Objective:    Today's Vitals   07/30/15 0911  BP: 140/88  Pulse: 70  Height: '6\' 1"'$  (1.854 m)  Weight: 215 lb 8 oz (97.75 kg)   Body mass index is 28.44 kg/(m^2).   INR was 2.5 today in office  A1c  8.2% (06/2015)      Assessment:    Uncontrolled type 2 DM Therapeutic anticoagulation HTN - at goal today  Screening Tests Health Maintenance  Topic Date Due  . ZOSTAVAX  05/24/2001  . PNA vac Low Risk Adult (1 of 2 - PCV13) 04/01/2016 (Originally 05/24/2006)  . HEMOGLOBIN A1C  08/18/2015  . INFLUENZA VACCINE  02/01/2016  . FOOT EXAM  04/28/2016  . OPHTHALMOLOGY EXAM  04/28/2016  . COLONOSCOPY  03/04/2024  . TETANUS/TDAP  06/05/2024        Plan:    Diabetes - continue current medications  Continue checking BG daily - if gets BG over 200 then call office for further adjustment in Lantus dose  Nutrition counseling - Reviewed limiting CHO and salt intake due to DM and HTN  Physical activity - continue with plan to increase physical activity - goal is at least 150 minutes weekly   RTC to recheck INR in 6 weeks - has appt with PCP  prevnar 13  given in office today.  Anticoagulation Dose Instructions as of 07/30/2015      Dorene Grebe Tue Wed Thu Fri Sat   New Dose 5 mg 5 mg 5 mg 5 mg 5 mg 5 mg 5 mg    Description        Continue warfarin '5mg'$  - take 1 tablet daily  Orders Placed This Encounter  Procedures  . Pneumococcal conjugate vaccine 13-valent     Cherre Robins, [facility]   07/30/2015

## 2015-08-11 ENCOUNTER — Encounter: Payer: Self-pay | Admitting: Cardiology

## 2015-08-11 ENCOUNTER — Ambulatory Visit (INDEPENDENT_AMBULATORY_CARE_PROVIDER_SITE_OTHER): Payer: Commercial Managed Care - HMO | Admitting: Cardiology

## 2015-08-11 VITALS — BP 154/87 | HR 102 | Ht 73.0 in | Wt 209.0 lb

## 2015-08-11 DIAGNOSIS — I10 Essential (primary) hypertension: Secondary | ICD-10-CM | POA: Diagnosis not present

## 2015-08-11 DIAGNOSIS — I4892 Unspecified atrial flutter: Secondary | ICD-10-CM | POA: Diagnosis not present

## 2015-08-11 MED ORDER — CHLORTHALIDONE 25 MG PO TABS: 25.0000 mg | ORAL_TABLET | Freq: Every day | ORAL | Status: DC

## 2015-08-11 NOTE — Progress Notes (Signed)
Cardiology Office Note   Date:  08/11/2015   ID:  Gram, Siedlecki 01/07/1941, MRN [id number]  PCP:  Kenn File, MD  Cardiologist:   Minus Breeding, MD   No chief complaint on file.     History of Present Illness: Timothy Guerrero is a 75 y.o. male who presents for follow-up of atrial flutter. He was doing this paroxysmally in the hospital when he was admitted for lobectomy for non-small cell lung cancer. We started him on anticoagulation. Atrial Fibrillation Clinic did not think ablation was indicated.  I have reviewed extensive outpatient medical records to understand his past cardiac history. He's had nonobstructive disease and small vessel disease with high-grade stenosis and a nondominant right coronary artery on cath in 2013 in the [place]. He had a stress test in 2015 which was negative. He's been managed medically. He's had a well preserved ejection fraction.  Since I last saw him he said no further cardiovascular symptoms. He denies any chest pressure, neck or arm discomfort. He's had no palpitations, presyncope or syncope. He's had no shortness of breath, PND or orthopnea. He does report his blood pressures have been consistently in the 185U systolic range.   Past Medical History  Diagnosis Date  . Diabetes mellitus without complication ([place])     10 years  . Hypertension     10 years  . Hypercholesteremia   . Colon polyp     2007, polyp with early cancer, one year f/u no residual polyp. next TCS 2010, see PSH. [facility].  . Skin melanoma ([place])   . Sleep apnea     wears CPAP sometimes  . Shortness of breath dyspnea     due to lung mass  . HOH (hard of hearing)   . Prostate cancer ([place])   . Atrial flutter ([place])     post op following lobectomy  . Lung cancer ([place])   . Cataract   . Benign essential tremor     Past Surgical History  Procedure Laterality Date  . Prostate surgery    . Knee replacements      bilateral. over 10 years ago  . Neck  surgery    . Total hip arthroplasty  2007    left  . Total shoulder replacement  2011    left  . Colonoscopy  08/2008    Dr. Leafy Half, [place]: normal, internal grade 1 hemorrhoids. next TCS 08/2013  . Colonoscopy N/A 03/04/2014    Procedure: COLONOSCOPY;  Surgeon: Daneil Dolin, MD;  [facility];  Service: Endoscopy;  Laterality: N/A;  7:30am  . Cardiac catheterization      no PCI  . Video assisted thoracoscopy (vats)/wedge resection Right 12/17/2014    Procedure: RIGHT VIDEO ASSISTED THORACOSCOPY (VATS);  Surgeon: Melrose Nakayama, MD;  Location: [place];  Service: Thoracic;  Laterality: Right;  . Lobectomy Right 12/17/2014    Procedure: RIGHT LOWER LOBECTOMY;  Surgeon: Melrose Nakayama, MD;  Location: [place];  Service: Thoracic;  Laterality: Right;  . Lymph node dissection Right 12/17/2014    Procedure: LYMPH NODE DISSECTION;  Surgeon: Melrose Nakayama, MD;  Location: [place];  Service: Thoracic;  Laterality: Right;  . Bottom lobe of right lung removed  12/17/14     Current Outpatient Prescriptions  Medication Sig Dispense Refill  . acetaminophen (TYLENOL) 500 MG tablet Take 1 tablet (500 mg total) by mouth every 6 (six) hours as  needed. 30 tablet 0  . Cholecalciferol 1000 UNITS TBDP Take 1,000 Int'l Units by mouth daily.    . cloNIDine (CATAPRES) 0.2 MG tablet Take 0.2 mg by mouth 2 (two) times daily.    [name] glipiZIDE (GLUCOTROL) 10 MG tablet Take 10 mg by mouth daily.    . hydrochlorothiazide (HYDRODIURIL) 25 MG tablet Take 1 tablet (25 mg total) by mouth daily. 90 tablet 3  . insulin glargine (LANTUS) 100 UNIT/ML injection Inject 52-54 Units into the skin daily.    [name] levothyroxine (SYNTHROID, LEVOTHROID) 50 MCG tablet TAKE 1 TABLET EVERY DAY 90 tablet 3  . lisinopril (PRINIVIL,ZESTRIL) 40 MG tablet Take 40 mg by mouth every evening.    . metFORMIN (GLUCOPHAGE) 1000 MG tablet Take 1,000 mg by mouth 2 (two) times daily.     . metoprolol succinate  (TOPROL-XL) 100 MG 24 hr tablet Take 100 mg by mouth daily.     [name] omeprazole (PRILOSEC) 20 MG capsule 20 mg continuous as needed.    . tiotropium (SPIRIVA) 18 MCG inhalation capsule Place 18 mcg into inhaler and inhale daily.    . valACYclovir (VALTREX) 1000 MG tablet Take 2 tablets (2,000 mg total) by mouth 2 (two) times daily. For 1 day for cold sores 12 tablet 2  . warfarin (COUMADIN) 5 MG tablet TAKE 1 TABLET EVERY DAY 90 tablet 0   No current facility-administered medications for this visit.    Allergies:   Review of patient's allergies indicates no known allergies.    ROS:  Please see the history of present illness.   Otherwise, review of systems are positive for none.   All other systems are reviewed and negative.    PHYSICAL EXAM: VS:  BP 154/87 mmHg  Pulse 102  Ht '6\' 1"'$  (1.854 m)  Wt 209 lb (94.802 kg)  BMI 27.58 kg/m2  SpO2 94% , BMI Body mass index is 27.58 kg/(m^2). GENERAL:  Well appearing HEENT:  Pupils equal round and reactive, fundi not visualized, oral mucosa unremarkable NECK:  No jugular venous distention, waveform within normal limits, carotid upstroke brisk and symmetric, no bruits, no thyromegaly LYMPHATICS:  No cervical, inguinal adenopathy LUNGS:  Clear to auscultation bilaterally BACK:  No CVA tenderness CHEST: Well healed right thoracotomy scar HEART:  PMI not displaced or sustained,S1 and S2 within normal limits, no S3, no S4, no clicks, no rubs, no murmurs ABD:  Flat, positive bowel sounds normal in frequency in pitch, no bruits, no rebound, no guarding, no midline pulsatile mass, no hepatomegaly, no splenomegaly EXT:  2 plus pulses throughout, no edema, no cyanosis no clubbing    EKG:  EKG is not ordered today.   Recent Labs: 02/17/2015: TSH 4.730* 04/21/2015: Hemoglobin 12.1*; Platelets 243 05/13/2015: ALT 14; BUN 12; Creatinine, Ser 0.84; Potassium 4.1; Sodium 143    Lipid Panel    Component Value Date/Time   CHOL 191 05/13/2015 1133    TRIG 199* 05/13/2015 1133   HDL 35* 05/13/2015 1133   CHOLHDL 5.5* 05/13/2015 1133   LDLCALC 116* 05/13/2015 1133      Wt Readings from Last 3 Encounters:  08/11/15 209 lb (94.802 kg)  07/30/15 215 lb 8 oz (97.75 kg)  06/18/15 216 lb (97.977 kg)      Other studies Reviewed: Additional studies/ records that were reviewed today include: Hospital records. Review of the above records demonstrates:  Please see elsewhere in the note.     ASSESSMENT AND PLAN:  ATRIAL FLUTTER:  I think it is very  likely that he has paroxysmal flutter.  Timothy Guerrero has a CHA2DS2 - VASc score of 4 with a risk of stroke of 4%.  I think anticoagulation chronically is indicated. He understands and accepts this risk. He could not afford a DOAC and so he will remain on warfarin.   HTN:  His blood pressure is elevated today.  I am going to stop HCTZ and start chlorthalidone.  He will keep a BP diary and we will discuss further med changes after I have seen a couple of weeks worth of data.   CAD:  As mentioned he has small vessel disease but no symptoms. I will continue to manage this medically.  Current medicines are reviewed at length with the patient today.  The patient does have concerns regarding medicines.  The following changes have been made:  As above  Labs/ tests ordered today include: None  No orders of the defined types were placed in this encounter.     Disposition:   FU with me in 12 months.     Signed, Minus Breeding, MD  08/11/2015 11:04 AM    [place]

## 2015-08-11 NOTE — Patient Instructions (Addendum)
Medication Instructions:  Please stop HCTZ and start Chlorthalidone 25 mg a day. Continue all other medications as listed.  Follow-Up: Follow up in 1 year with Dr. Percival Spanish in [place].  You will receive a letter in the mail 2 months before you are due.  Please call us when you receive this letter to schedule your follow up appointment.  If you need a refill on your cardiac medications before your next appointment, please call your pharmacy.  Thank you for choosing [facility]!!

## 2015-08-23 ENCOUNTER — Ambulatory Visit (HOSPITAL_COMMUNITY): Payer: Commercial Managed Care - HMO | Admitting: Hematology & Oncology

## 2015-08-23 NOTE — Progress Notes (Signed)
This encounter was created in error - please disregard.

## 2015-08-24 ENCOUNTER — Ambulatory Visit (HOSPITAL_COMMUNITY): Payer: Commercial Managed Care - HMO | Admitting: Hematology & Oncology

## 2015-08-25 ENCOUNTER — Ambulatory Visit: Payer: Commercial Managed Care - HMO | Admitting: Cardiology

## 2015-09-17 ENCOUNTER — Ambulatory Visit (INDEPENDENT_AMBULATORY_CARE_PROVIDER_SITE_OTHER): Payer: Commercial Managed Care - HMO | Admitting: Family Medicine

## 2015-09-17 ENCOUNTER — Encounter: Payer: Self-pay | Admitting: Family Medicine

## 2015-09-17 VITALS — BP 112/66 | HR 84 | Temp 95.6°F | Ht 73.0 in | Wt 209.8 lb

## 2015-09-17 DIAGNOSIS — E118 Type 2 diabetes mellitus with unspecified complications: Secondary | ICD-10-CM

## 2015-09-17 DIAGNOSIS — R946 Abnormal results of thyroid function studies: Secondary | ICD-10-CM

## 2015-09-17 DIAGNOSIS — R7989 Other specified abnormal findings of blood chemistry: Secondary | ICD-10-CM

## 2015-09-17 DIAGNOSIS — I10 Essential (primary) hypertension: Secondary | ICD-10-CM

## 2015-09-17 LAB — COAGUCHEK XS/INR WAIVED
INR: 2.7 — ABNORMAL HIGH (ref 0.9–1.1)
Prothrombin Time: 32.5 s

## 2015-09-17 LAB — BAYER DCA HB A1C WAIVED: HB A1C (BAYER DCA - WAIVED): 8.1 % — ABNORMAL HIGH (ref <7.0)

## 2015-09-17 NOTE — Progress Notes (Signed)
   HPI  Patient presents today here to follow-up for diabetes, hypertension, and thyroid   Diabetes oing well without complaints His average fasting blood sugar he states is 80-120 He states that since starting apple cider vinegar he has had to stop using his insulin several days a week, on average she misses at least 3-4 doses per week He denies any hypoglycemia He eats regular meals. Taking all oral medicines  Hypertension Blood pressure seems to be improved as well after startingapple cider vinegar, however he is taking all of his blood pressure medicines. Denies any chest pain, shortness of breath,  Leg edema  Hyperlipidemia Watching his diet moderately Does not want to start medications currently, we have discussed this at lengthand he will consider  PMH: Smoking status noted ROS: Per HPI  Objective: BP 112/66 mmHg  Pulse 84  Temp(Src) 95.6 F (35.3 C) (Oral)  Ht '6\' 1"'$  (1.854 m)  Wt 209 lb 12.8 oz (95.165 kg)  BMI 27.69 kg/m2 Gen: NAD, alert, cooperative with exam HEENT: NCAT CV: RRR, good S1/S2, no murmur Resp: CTABL, no wheezes, non-labored Ext: No edema, warm Neuro: Alert and oriented, No gross deficits  Assessment and plan:  # type 2 diabetes Controlled worsening, A1c now 8.1 I think this is mostly due to insulin noncompliance, to help increase compliance I have decreased the dose of Lantus to 40 units dailyContinue glipizide and metformin His GFR is normal  # hyperlipidemia Considering diabetes and his LDL116 and he should probably be on a statin We discussed this and he will consider  # hypertension Very well-controlled today Continue current medications without changes  # subclinical hypothyroidism TSH slightly elevated last visit, will plan to recheck in one year     Orders Placed This Encounter  Procedures  . Bayer DCA Hb A1c Waived  . CoaguChek XS/INR Carney Bern, MD [facility] 09/17/2015, 12:17  PM

## 2015-09-17 NOTE — Patient Instructions (Signed)
Great to see you!  Lets have you see Tammy Eckard in 1 month to discuss Diabetes and check your INR  Decrease your lantus dose to 40 units daily  But try to take it everyday, Call us if you get any low blood sugars or need to make any changes.   More consistent dosing will take care of the food you eat better than skipping some days.   I will see you back in 3 months for diabetes.

## 2015-10-29 ENCOUNTER — Encounter: Payer: Self-pay | Admitting: Pharmacist

## 2015-10-29 ENCOUNTER — Ambulatory Visit (INDEPENDENT_AMBULATORY_CARE_PROVIDER_SITE_OTHER): Payer: Commercial Managed Care - HMO | Admitting: Pharmacist

## 2015-10-29 VITALS — BP 115/68 | HR 70 | Ht 73.0 in | Wt 210.5 lb

## 2015-10-29 DIAGNOSIS — Z794 Long term (current) use of insulin: Secondary | ICD-10-CM | POA: Diagnosis not present

## 2015-10-29 DIAGNOSIS — I4892 Unspecified atrial flutter: Secondary | ICD-10-CM

## 2015-10-29 DIAGNOSIS — E1165 Type 2 diabetes mellitus with hyperglycemia: Secondary | ICD-10-CM | POA: Diagnosis not present

## 2015-10-29 DIAGNOSIS — IMO0001 Reserved for inherently not codable concepts without codable children: Secondary | ICD-10-CM

## 2015-10-29 LAB — COAGUCHEK XS/INR WAIVED
INR: 2.1 — ABNORMAL HIGH (ref 0.9–1.1)
Prothrombin Time: 25.1 s

## 2015-10-29 MED ORDER — DULAGLUTIDE 0.75 MG/0.5ML ~~LOC~~ SOAJ: 0.7500 mg | SUBCUTANEOUS | Status: DC

## 2015-10-29 NOTE — Progress Notes (Signed)
Patient ID: Timothy Guerrero, male   DOB: 09/01/1940, 75 y.o.   MRN: [id number]    Subjective:   Timothy Guerrero is a 75 y.o. divorced white male who presents for diabetes and INR recheck.   Review of Systems  Constitutional: Negative.   HENT: Negative.   Eyes: Negative.   Respiratory: Negative.   Cardiovascular: Negative.   Gastrointestinal: Negative.   Genitourinary: Negative.   Musculoskeletal: Negative.   Skin: Negative.   Neurological: Negative.   Endo/Heme/Allergies: Negative.   Psychiatric/Behavioral: Negative.     Current Medications (verified) Outpatient Encounter Prescriptions as of 10/29/2015  Medication Sig  . acetaminophen (TYLENOL) 500 MG tablet Take 1 tablet (500 mg total) by mouth every 6 (six) hours as needed.  . chlorthalidone (HYGROTON) 25 MG tablet Take 1 tablet (25 mg total) by mouth daily.  . Cholecalciferol 1000 UNITS TBDP Take 1,000 Int'l Units by mouth daily.  . cloNIDine (CATAPRES) 0.2 MG tablet Take 0.2 mg by mouth 2 (two) times daily.  . Dulaglutide (TRULICITY) 7.35 HG/9.9ME SOPN Inject 0.75 mg into the skin once a week.  [name] glipiZIDE (GLUCOTROL) 10 MG tablet Take 10 mg by mouth daily.  . insulin glargine (LANTUS) 100 UNIT/ML injection Inject 40 Units into the skin daily.  [name] levothyroxine (SYNTHROID, LEVOTHROID) 50 MCG tablet TAKE 1 TABLET EVERY DAY  . lisinopril (PRINIVIL,ZESTRIL) 40 MG tablet Take 40 mg by mouth every evening.  . metFORMIN (GLUCOPHAGE) 1000 MG tablet Take 1,000 mg by mouth 2 (two) times daily.   . metoprolol succinate (TOPROL-XL) 100 MG 24 hr tablet Take 100 mg by mouth daily.   [name] omeprazole (PRILOSEC) 20 MG capsule 20 mg continuous as needed.  . tiotropium (SPIRIVA) 18 MCG inhalation capsule Place 18 mcg into inhaler and inhale daily.  . valACYclovir (VALTREX) 1000 MG tablet Take 2 tablets (2,000 mg total) by mouth 2 (two) times daily. For 1 day for cold sores  . warfarin (COUMADIN) 5 MG tablet TAKE 1 TABLET EVERY DAY   No  facility-administered encounter medications on file as of 10/29/2015.    Allergies (verified) Review of patient's allergies indicates no known allergies.   History: Past Medical History  Diagnosis Date  . Diabetes mellitus without complication ([place])     10 years  . Hypertension     10 years  . Hypercholesteremia   . Colon polyp     2007, polyp with early cancer, one year f/u no residual polyp. next TCS 2010, see PSH. [facility].  . Skin melanoma ([place])   . Sleep apnea     wears CPAP sometimes  . Shortness of breath dyspnea     due to lung mass  . HOH (hard of hearing)   . Prostate cancer ([place])   . Atrial flutter ([place])     post op following lobectomy  . Lung cancer ([place])   . Cataract   . Benign essential tremor    Past Surgical History  Procedure Laterality Date  . Prostate surgery    . Knee replacements      bilateral. over 10 years ago  . Neck surgery    . Total hip arthroplasty  2007    left  . Total shoulder replacement  2011    left  . Colonoscopy  08/2008    Dr. Leafy Half, [place]: normal, internal grade 1 hemorrhoids. next TCS 08/2013  . Colonoscopy N/A 03/04/2014    Procedure: COLONOSCOPY;  Surgeon: Cristopher Estimable  Rourk, MD;  [facility];  Service: Endoscopy;  Laterality: N/A;  7:30am  . Cardiac catheterization      no PCI  . Video assisted thoracoscopy (vats)/wedge resection Right 12/17/2014    Procedure: RIGHT VIDEO ASSISTED THORACOSCOPY (VATS);  Surgeon: Melrose Nakayama, MD;  Location: [place];  Service: Thoracic;  Laterality: Right;  . Lobectomy Right 12/17/2014    Procedure: RIGHT LOWER LOBECTOMY;  Surgeon: Melrose Nakayama, MD;  Location: [place];  Service: Thoracic;  Laterality: Right;  . Lymph node dissection Right 12/17/2014    Procedure: LYMPH NODE DISSECTION;  Surgeon: Melrose Nakayama, MD;  Location: [place];  Service: Thoracic;  Laterality: Right;  . Bottom lobe of right lung removed  12/17/14   Family  History  Problem Relation Age of Onset  . Colon cancer Neg Hx   . Cancer Mother     unknown type  . COPD Sister   . Breast cancer Sister   . Cancer Sister 107    breast  . Prostate cancer Son     died age 68   Social History   Occupational History  . retired    Social History Main Topics  . Smoking status: Former Smoker -- 2.00 packs/day    Types: Cigarettes    Quit date: 07/03/1986  . Smokeless tobacco: Never Used  . Alcohol Use: No  . Drug Use: No  . Sexual Activity: Yes      Objective:    Today's Vitals   10/29/15 1612  BP: 115/68  Pulse: 70  Height: '6\' 1"'$  (1.854 m)  Weight: 210 lb 8 oz (95.482 kg)   Body mass index is 27.78 kg/(m^2).   INR was 2.1 today in office  A1c was up to 8.1% (09/17/2015)      Assessment:    Uncontrolled type 2 DM Therapeutic anticoagulation HTN  Screening Tests Health Maintenance  Topic Date Due  . ZOSTAVAX  05/24/2001  . INFLUENZA VACCINE  02/01/2016  . HEMOGLOBIN A1C  03/19/2016  . FOOT EXAM  04/28/2016  . OPHTHALMOLOGY EXAM  04/28/2016  . PNA vac Low Risk Adult (2 of 2 - PPSV23) 07/29/2016  . COLONOSCOPY  03/04/2024  . TETANUS/TDAP  06/05/2024        Plan:   1.  Add Trulicity 0.'75mg'$  - inject one syringeful once a week 2.  COntinue to check BG once or twice a day.  Reviewed HBG goals.  3.  See below for warfarin dosing - recheck in 1 month  Anticoagulation Dose Instructions as of 10/29/2015      Dorene Grebe Tue Wed Thu Fri Sat   New Dose 5 mg 5 mg 5 mg 5 mg 5 mg 5 mg 5 mg    Description        Continue warfarin '5mg'$  - take 1 tablet daily     Orders Placed This Encounter  Procedures  . CoaguChek XS/INR Mariea Stable, [facility]   10/29/2015

## 2015-10-29 NOTE — Patient Instructions (Signed)
Anticoagulation Dose Instructions as of 10/29/2015      Dorene Grebe Tue Wed Thu Fri Sat   New Dose 5 mg 5 mg 5 mg 5 mg 5 mg 5 mg 5 mg    Description        Continue warfarin '5mg'$  - take 1 tablet daily     INR was 2.1 today

## 2015-11-05 ENCOUNTER — Other Ambulatory Visit: Payer: Self-pay | Admitting: Family Medicine

## 2015-11-05 ENCOUNTER — Other Ambulatory Visit: Payer: Self-pay

## 2015-11-05 MED ORDER — GLIPIZIDE 10 MG PO TABS: 10.0000 mg | ORAL_TABLET | Freq: Every day | ORAL | Status: DC

## 2015-11-15 ENCOUNTER — Encounter: Payer: Self-pay | Admitting: Family Medicine

## 2015-11-15 ENCOUNTER — Ambulatory Visit (INDEPENDENT_AMBULATORY_CARE_PROVIDER_SITE_OTHER): Payer: Commercial Managed Care - HMO | Admitting: Family Medicine

## 2015-11-15 VITALS — BP 133/81 | HR 85 | Temp 97.6°F | Ht 73.0 in | Wt 202.8 lb

## 2015-11-15 DIAGNOSIS — D1724 Benign lipomatous neoplasm of skin and subcutaneous tissue of left leg: Secondary | ICD-10-CM | POA: Diagnosis not present

## 2015-11-15 DIAGNOSIS — Z902 Acquired absence of lung [part of]: Secondary | ICD-10-CM

## 2015-11-15 DIAGNOSIS — R42 Dizziness and giddiness: Secondary | ICD-10-CM

## 2015-11-15 DIAGNOSIS — Z85820 Personal history of malignant melanoma of skin: Secondary | ICD-10-CM | POA: Diagnosis not present

## 2015-11-15 DIAGNOSIS — I10 Essential (primary) hypertension: Secondary | ICD-10-CM | POA: Diagnosis not present

## 2015-11-15 DIAGNOSIS — Z85828 Personal history of other malignant neoplasm of skin: Secondary | ICD-10-CM | POA: Diagnosis not present

## 2015-11-15 DIAGNOSIS — L821 Other seborrheic keratosis: Secondary | ICD-10-CM | POA: Diagnosis not present

## 2015-11-15 DIAGNOSIS — L570 Actinic keratosis: Secondary | ICD-10-CM | POA: Diagnosis not present

## 2015-11-15 DIAGNOSIS — D1801 Hemangioma of skin and subcutaneous tissue: Secondary | ICD-10-CM | POA: Diagnosis not present

## 2015-11-15 MED ORDER — OMEPRAZOLE 20 MG PO CPDR: 20.0000 mg | DELAYED_RELEASE_CAPSULE | Freq: Every day | ORAL | Status: DC

## 2015-11-15 MED ORDER — CLONIDINE HCL 0.1 MG PO TABS: 0.1000 mg | ORAL_TABLET | Freq: Two times a day (BID) | ORAL | Status: DC

## 2015-11-15 NOTE — Progress Notes (Signed)
   HPI  Patient presents today here with dizziness.  Patient's lines over the last 2 months he's had dizziness off and on. He describes an unsteady type feeling with loss of balance when he first stands up that lasts less than 30 seconds His blood pressure at baseline has been 130s over 60s and 70s Lungs good medication compliance. He denies any chest pain, focal weakness, or room spinning sensation  PMH: Smoking status noted ROS: Per HPI  Objective: BP 133/81 mmHg  Pulse 85  Temp(Src) 97.6 F (36.4 C) (Oral)  Ht '6\' 1"'$  (1.854 m)  Wt 202 lb 12.8 oz (91.989 kg)  BMI 26.76 kg/m2 Gen: NAD, alert, cooperative with exam HEENT: NCAT CV: RRR, good S1/S2, no murmur Resp: CTABL, no wheezes, non-labored Ext: No edema, warm Neuro: Alert and oriented, No gross deficits  Assessment and plan:  # Dizziness Likely orthostatic hypotension Decreased clonidine to 0.1 mg twice daily, I would like to discontinue this altogether. Continue chlorthalidone, lisinopril, metoprolol Return to clinic per routine, will return with any increased symptoms, worsening hypertension, or other concerns.  Essential hypertension Well-controlled today Liberating slightly with dizziness on standing  Meds ordered this encounter  Medications  . cloNIDine (CATAPRES) 0.1 MG tablet    Sig: Take 1 tablet (0.1 mg total) by mouth 2 (two) times daily.    Dispense:  60 tablet    Refill:  3  . omeprazole (PRILOSEC) 20 MG capsule    Sig: Take 1 capsule (20 mg total) by mouth daily.    Dispense:  30 capsule    Refill:  [place], MD [place] 11/15/2015, 3:20 PM

## 2015-11-15 NOTE — Patient Instructions (Addendum)
Great to see you!  I am decreasing your clonidine. I think your dizziness is from a transient low blood pressure that you feel when you first stand.   If you are having persistently elevated blood pressures please let us know.

## 2015-11-24 ENCOUNTER — Telehealth: Payer: Self-pay | Admitting: Family Medicine

## 2015-11-24 ENCOUNTER — Telehealth: Payer: Self-pay | Admitting: Pharmacist

## 2015-11-24 NOTE — Telephone Encounter (Signed)
Patient's pain has resolved - asked for samples for trulicity to last until next week's appt - #2 pens left at front desk.

## 2015-11-24 NOTE — Telephone Encounter (Signed)
Call given to triage nurse

## 2015-11-25 ENCOUNTER — Ambulatory Visit: Payer: Commercial Managed Care - HMO | Admitting: Family Medicine

## 2015-11-30 ENCOUNTER — Ambulatory Visit (INDEPENDENT_AMBULATORY_CARE_PROVIDER_SITE_OTHER): Payer: Commercial Managed Care - HMO | Admitting: Family Medicine

## 2015-11-30 ENCOUNTER — Encounter: Payer: Self-pay | Admitting: Family Medicine

## 2015-11-30 VITALS — BP 143/81 | HR 103 | Temp 97.4°F | Ht 73.0 in | Wt 197.8 lb

## 2015-11-30 DIAGNOSIS — R55 Syncope and collapse: Secondary | ICD-10-CM | POA: Diagnosis not present

## 2015-11-30 DIAGNOSIS — I10 Essential (primary) hypertension: Secondary | ICD-10-CM | POA: Diagnosis not present

## 2015-11-30 NOTE — Patient Instructions (Signed)
Great to see you!  Lets have you continue on only metoprolol. If your blood pressure is persistently elevated above 140/90 lets re-start lisinopril in 1 week  Lets also get you to follow up with Dr. Percival Spanish in a month or so.   Syncope Syncope is a medical term for fainting or passing out. This means you lose consciousness and drop to the ground. People are generally unconscious for less than 5 minutes. You may have some muscle twitches for up to 15 seconds before waking up and returning to normal. Syncope occurs more often in older adults, but it can happen to anyone. While most causes of syncope are not dangerous, syncope can be a sign of a serious medical problem. It is important to seek medical care.  CAUSES  Syncope is caused by a sudden drop in blood flow to the brain. The specific cause is often not determined. Factors that can bring on syncope include:  Taking medicines that lower blood pressure.  Sudden changes in posture, such as standing up quickly.  Taking more medicine than prescribed.  Standing in one place for too long.  Seizure disorders.  Dehydration and excessive exposure to heat.  Low blood sugar (hypoglycemia).  Straining to have a bowel movement.  Heart disease, irregular heartbeat, or other circulatory problems.  Fear, emotional distress, seeing blood, or severe pain. SYMPTOMS  Right before fainting, you may:  Feel dizzy or light-headed.  Feel nauseous.  See all white or all black in your field of vision.  Have cold, clammy skin. DIAGNOSIS  Your health care provider will ask about your symptoms, perform a physical exam, and perform an electrocardiogram (ECG) to record the electrical activity of your heart. Your health care provider may also perform other heart or blood tests to determine the cause of your syncope which may include:  Transthoracic echocardiogram (TTE). During echocardiography, sound waves are used to evaluate how blood flows through  your heart.  Transesophageal echocardiogram (TEE).  Cardiac monitoring. This allows your health care provider to monitor your heart rate and rhythm in real time.  Holter monitor. This is a portable device that records your heartbeat and can help diagnose heart arrhythmias. It allows your health care provider to track your heart activity for several days, if needed.  Stress tests by exercise or by giving medicine that makes the heart beat faster. TREATMENT  In most cases, no treatment is needed. Depending on the cause of your syncope, your health care provider may recommend changing or stopping some of your medicines. HOME CARE INSTRUCTIONS  Have someone stay with you until you feel stable.  Do not drive, use machinery, or play sports until your health care provider says it is okay.  Keep all follow-up appointments as directed by your health care provider.  Lie down right away if you start feeling like you might faint. Breathe deeply and steadily. Wait until all the symptoms have passed.  Drink enough fluids to keep your urine clear or pale yellow.  If you are taking blood pressure or heart medicine, get up slowly and take several minutes to sit and then stand. This can reduce dizziness. SEEK IMMEDIATE MEDICAL CARE IF:   You have a severe headache.  You have unusual pain in the chest, abdomen, or back.  You are bleeding from your mouth or rectum, or you have black or tarry stool.  You have an irregular or very fast heartbeat.  You have pain with breathing.  You have repeated fainting or seizure-like  jerking during an episode.  You faint when sitting or lying down.  You have confusion.  You have trouble walking.  You have severe weakness.  You have vision problems. If you fainted, call your local emergency services (911 in [place] Do not drive yourself to the hospital.    This information is not intended to replace advice given to you by your health care provider. Make  sure you discuss any questions you have with your health care provider.   Document Released: 06/19/2005 Document Revised: 11/03/2014 Document Reviewed: 08/18/2011 Elsevier Interactive Patient Education [employer].

## 2015-11-30 NOTE — Progress Notes (Signed)
   HPI  Patient presents today here for hypertension and recent syncope.  3 days ago patient was mowing the lawn a riding lawnmower, he felt very tired and had a difficult time focusing on what he was doing He stopped to rest and fell asleep. When he stood up he felt dizzy and states that he fell down and passed out.  His blood pressure has been a little bit lower over the last 3 weeks than previously. He's had several blood pressures around 218-288 systolic, he's had very few that were above 130.  Since that time he has stopped metoprolol, lisinopril and clonidine. He states that he feels much better and has not had any other symptoms since that time.    PMH: Smoking status noted ROS: Per HPI  Objective: BP 143/81 mmHg  Pulse 103  Temp(Src) 97.4 F (36.3 C) (Oral)  Ht _0  (1.854 m)  Wt 197 lb 12.8 oz (89.721 kg)  BMI 26.10 kg/m2 Gen: NAD, alert, cooperative with exam HEENT: NCAT, PERRLA, EOMI CV: Regular, slightly tachycardia Resp: CTABL, no wheezes, non-labored Ext: No edema, warm Neuro: Alert and oriented, strength 5/5 and sensation intact in all 4 extremities, cranial nerves II through XII intact  Assessment and plan:  # Hypertension Over treated, discontinue clonidine, chlorthalidone Likely he is having decreased blood pressure with increased physical exertion and mild dehydration Continue metoprolol If blood pressures are not controlled restart lisinopril in one week.  # Syncope New problem EKG today with good P waves, Rate 95, will place holter Previous echocardiogram in September 2015 with normal EF, that was a stress Myoview also had no ischemia He does have a history of A. fib after chest surgery, he's tachy today but has stopped metop  given his presentation I think it's unlikely to be anything neurologic, also has neurologic exam is reassuring  labs     Orders Placed This Encounter  Procedures  . CBC with Differential  . CMP14+EGFR  . Holter  monitor - 24 hour    Standing Status: Future     Number of Occurrences:      Standing Expiration Date: 11/29/2025  . EKG 12-Lead     [name], MD [facility] 11/30/2015, 8:57 AM

## 2015-12-01 ENCOUNTER — Telehealth: Payer: Self-pay

## 2015-12-01 DIAGNOSIS — R55 Syncope and collapse: Secondary | ICD-10-CM

## 2015-12-01 NOTE — Telephone Encounter (Signed)
Patient states that he went to see Hochrine to make apt and he told patient he would need referral in order to be seen and for the doctor to let him know when he needs to be seen. Patient aware you are out of the office today and you will be in tomorrow. Please advise

## 2015-12-02 ENCOUNTER — Ambulatory Visit: Payer: Self-pay

## 2015-12-02 ENCOUNTER — Telehealth (HOSPITAL_COMMUNITY): Payer: Self-pay | Admitting: *Deleted

## 2015-12-02 DIAGNOSIS — H538 Other visual disturbances: Secondary | ICD-10-CM | POA: Diagnosis not present

## 2015-12-02 DIAGNOSIS — H2512 Age-related nuclear cataract, left eye: Secondary | ICD-10-CM | POA: Diagnosis not present

## 2015-12-02 DIAGNOSIS — R55 Syncope and collapse: Secondary | ICD-10-CM | POA: Insufficient documentation

## 2015-12-02 LAB — HM DIABETES EYE EXAM

## 2015-12-02 NOTE — Telephone Encounter (Signed)
Pt notified of referral  

## 2015-12-02 NOTE — Telephone Encounter (Signed)
Placed referral to cardiology for syncope. I am doing a work up as well, would like to ensure Dr. Percival Spanish doesn't want any additional work up.   [name], MD [facility] 12/02/2015, 8:16 AM

## 2015-12-03 ENCOUNTER — Ambulatory Visit (INDEPENDENT_AMBULATORY_CARE_PROVIDER_SITE_OTHER): Payer: Commercial Managed Care - HMO | Admitting: Pharmacist

## 2015-12-03 ENCOUNTER — Ambulatory Visit (INDEPENDENT_AMBULATORY_CARE_PROVIDER_SITE_OTHER): Payer: Commercial Managed Care - HMO | Admitting: Family Medicine

## 2015-12-03 ENCOUNTER — Encounter: Payer: Self-pay | Admitting: Pharmacist

## 2015-12-03 VITALS — Ht 73.0 in | Wt 199.0 lb

## 2015-12-03 DIAGNOSIS — R55 Syncope and collapse: Secondary | ICD-10-CM | POA: Diagnosis not present

## 2015-12-03 DIAGNOSIS — I4892 Unspecified atrial flutter: Secondary | ICD-10-CM | POA: Diagnosis not present

## 2015-12-03 DIAGNOSIS — E119 Type 2 diabetes mellitus without complications: Secondary | ICD-10-CM | POA: Diagnosis not present

## 2015-12-03 LAB — COAGUCHEK XS/INR WAIVED
INR: 2.3 — ABNORMAL HIGH (ref 0.9–1.1)
Prothrombin Time: 27.6 s

## 2015-12-03 NOTE — Progress Notes (Signed)
Pt came in today for Holter Monitor. Holter monitor placed and instructions given to pt.

## 2015-12-03 NOTE — Progress Notes (Signed)
Patient ID: Timothy Guerrero, male   DOB: 1940-11-07, 75 y.o.   MRN: [id number]    Subjective:   Timothy Guerrero is a 75 y.o. divorced white male who presents for diabetes and INR recheck. He has type 2 diabetes requiring insulin and was taking Lantus 54 units once daily along with  metformin '1000mg'$  bid and glipizide '10mg'$  once daily.  However, about 1 month ago started Trulicity 0.'75mg'$  SQ weekly and due to improved BG he is now off Lantus.   He has been checking at home more frequently but he did not bring in glucometer or a record of BG readings from home.  He self reports that BG ranges from 90 to 200.  Timothy Guerrero is a member of gym and has been going regularly for the last 2 to 3 months   Current warfarin dose is '5mg'$  1 tablet daily Patient denies any s/s of bruising or bleeding  Review of Systems  Constitutional: Negative.   HENT: Negative.   Eyes: Negative.   Respiratory: Negative.   Cardiovascular: Negative.   Gastrointestinal: Negative.   Genitourinary: Negative.   Musculoskeletal: Negative.   Skin: Negative.   Neurological: Negative.   Endo/Heme/Allergies: Negative.   Psychiatric/Behavioral: Negative.     Current Medications (verified) Outpatient Encounter Prescriptions as of 12/03/2015  Medication Sig  . acetaminophen (TYLENOL) 500 MG tablet Take 1 tablet (500 mg total) by mouth every 6 (six) hours as needed.  . chlorthalidone (HYGROTON) 25 MG tablet Take 1 tablet (25 mg total) by mouth daily.  . Cholecalciferol 1000 UNITS TBDP Take 1,000 Int'l Units by mouth daily.  . Dulaglutide (TRULICITY) 4.09 WJ/1.9JY SOPN Inject 0.75 mg into the skin once a week.  [name] glipiZIDE (GLUCOTROL) 10 MG tablet Take 1 tablet (10 mg total) by mouth daily.  [name] levothyroxine (SYNTHROID, LEVOTHROID) 50 MCG tablet TAKE 1 TABLET EVERY DAY  . metFORMIN (GLUCOPHAGE) 1000 MG tablet Take 1,000 mg by mouth 2 (two) times daily.   . metoprolol succinate (TOPROL-XL) 100 MG 24 hr tablet Take 100 mg by mouth daily.    [name] omeprazole (PRILOSEC) 20 MG capsule Take 1 capsule (20 mg total) by mouth daily.  [name] tiotropium (SPIRIVA) 18 MCG inhalation capsule Place 18 mcg into inhaler and inhale daily.  . valACYclovir (VALTREX) 1000 MG tablet Take 2 tablets (2,000 mg total) by mouth 2 (two) times daily. For 1 day for cold sores  . warfarin (COUMADIN) 5 MG tablet TAKE 1 TABLET EVERY DAY  . [DISCONTINUED] cloNIDine (CATAPRES) 0.1 MG tablet Take 1 tablet (0.1 mg total) by mouth 2 (two) times daily. (Patient not taking: Reported on 12/03/2015)  . [DISCONTINUED] lisinopril (PRINIVIL,ZESTRIL) 40 MG tablet Take 40 mg by mouth every evening. Reported on 12/03/2015   No facility-administered encounter medications on file as of 12/03/2015.    Allergies (verified) Review of patient's allergies indicates no known allergies.   History: Past Medical History  Diagnosis Date  . Diabetes mellitus without complication ([place])     10 years  . Hypertension     10 years  . Hypercholesteremia   . Colon polyp     2007, polyp with early cancer, one year f/u no residual polyp. next TCS 2010, see PSH. [facility].  . Skin melanoma ([place])   . Sleep apnea     wears CPAP sometimes  . Shortness of breath dyspnea     due to lung mass  . HOH (hard of hearing)   . Prostate cancer (  [place])   . Atrial flutter ([place])     post op following lobectomy  . Lung cancer ([place])   . Cataract   . Benign essential tremor    Past Surgical History  Procedure Laterality Date  . Prostate surgery    . Knee replacements      bilateral. over 10 years ago  . Neck surgery    . Total hip arthroplasty  2007    left  . Total shoulder replacement  2011    left  . Colonoscopy  08/2008    Dr. Leafy Half, [place]: normal, internal grade 1 hemorrhoids. next TCS 08/2013  . Colonoscopy N/A 03/04/2014    Procedure: COLONOSCOPY;  Surgeon: Daneil Dolin, MD;  [facility];  Service: Endoscopy;  Laterality: N/A;  7:30am  .  Cardiac catheterization      no PCI  . Video assisted thoracoscopy (vats)/wedge resection Right 12/17/2014    Procedure: RIGHT VIDEO ASSISTED THORACOSCOPY (VATS);  Surgeon: Melrose Nakayama, MD;  Location: [place];  Service: Thoracic;  Laterality: Right;  . Lobectomy Right 12/17/2014    Procedure: RIGHT LOWER LOBECTOMY;  Surgeon: Melrose Nakayama, MD;  Location: [place];  Service: Thoracic;  Laterality: Right;  . Lymph node dissection Right 12/17/2014    Procedure: LYMPH NODE DISSECTION;  Surgeon: Melrose Nakayama, MD;  Location: [place];  Service: Thoracic;  Laterality: Right;  . Bottom lobe of right lung removed  12/17/14   Family History  Problem Relation Age of Onset  . Colon cancer Neg Hx   . Cancer Mother     unknown type  . COPD Sister   . Breast cancer Sister   . Cancer Sister 33    breast  . Prostate cancer Son     died age 91   Social History   Occupational History  . retired    Social History Main Topics  . Smoking status: Former Smoker -- 2.00 packs/day    Types: Cigarettes    Quit date: 07/03/1986  . Smokeless tobacco: Never Used  . Alcohol Use: No  . Drug Use: No  . Sexual Activity: Yes      Objective:    Today's Vitals   12/03/15 1524  Height: '6\' 1"'$  (1.854 m)  Weight: 199 lb (90.266 kg)   Body mass index is 26.26 kg/(m^2).   INR was 2.5 today in office  A1c  8.2% (06/2015)      Assessment:    Controlled type 2 DM Therapeutic anticoagulation HTN - at goal today Overweight - weight have decreased since starting Trulicity      Plan:    Diabetes - continue current medications.  Great response with Trulicity  Continue checking BG daily  Nutrition counseling - Reviewed limiting CHO intake Continue warfarin '5mg'$  take 1 tablet daily  Physical activity - continue with increased physical activity - goal is at least 150 minutes weekly   RTC to recheck INR in 6 weeks  Anticoagulation Dose Instructions as of 12/03/2015      Dorene Grebe Tue Wed  Thu Fri Sat   New Dose 5 mg 5 mg 5 mg 5 mg 5 mg 5 mg 5 mg    Description        Continue warfarin '5mg'$  - take 1 tablet daily     Orders Placed This Encounter  Procedures  . CoaguChek XS/INR Waived    Standing Status: Standing     Number of Occurrences: 15  Standing Expiration Date: 12/02/2016     Cherre Robins, [facility]   12/03/2015

## 2015-12-03 NOTE — Patient Instructions (Signed)
Anticoagulation Dose Instructions as of 12/03/2015      Timothy Guerrero Tue Wed Thu Fri Sat   New Dose 5 mg 5 mg 5 mg 5 mg 5 mg 5 mg 5 mg    Description        Continue warfarin '5mg'$  - take 1 tablet daily     INR was 2.3 today

## 2015-12-16 ENCOUNTER — Encounter: Payer: Self-pay | Admitting: Family Medicine

## 2015-12-16 ENCOUNTER — Ambulatory Visit (INDEPENDENT_AMBULATORY_CARE_PROVIDER_SITE_OTHER): Payer: Commercial Managed Care - HMO | Admitting: Family Medicine

## 2015-12-16 VITALS — BP 215/98 | HR 84 | Temp 97.9°F | Ht 73.0 in | Wt 198.2 lb

## 2015-12-16 DIAGNOSIS — I10 Essential (primary) hypertension: Secondary | ICD-10-CM

## 2015-12-16 DIAGNOSIS — E162 Hypoglycemia, unspecified: Secondary | ICD-10-CM

## 2015-12-16 LAB — GLUCOSE HEMOCUE WAIVED: Glu Hemocue Waived: 167 mg/dL — ABNORMAL HIGH (ref 65–99)

## 2015-12-16 MED ORDER — LISINOPRIL 40 MG PO TABS: 40.0000 mg | ORAL_TABLET | Freq: Every day | ORAL | Status: DC

## 2015-12-16 NOTE — Progress Notes (Signed)
   HPI  Patient presents today here with high blood pressure and hyperglycemia.  Hypertension Patient states over the last 3-4 days he's had elevated blood pressure, it's ranged from 509-326 systolic. No headache, chest pain, dyspnea, palpitations He has not been taking chlorthalidone We recently backed off of his blood pressure medicines due to dizziness, this has completely resolved.  Hypoglycemia Single episode this morning, blood sugar was 42, no symptoms. He has not changed his diabetic medications, he is eating like normal.  PMH: Smoking status noted ROS: Per HPI  Objective: BP 215/98 mmHg  Pulse 84  Temp(Src) 97.9 F (36.6 C) (Oral)  Ht '6\' 1"'$  (1.854 m)  Wt 198 lb 3.2 oz (89.903 kg)  BMI 26.16 kg/m2 Gen: NAD, alert, cooperative with exam HEENT: NCAT CV: RRR, good S1/S2, no murmur Resp: CTABL, no wheezes, non-labored Ext: No edema, warm Neuro: Alert and oriented, No gross deficits  Assessment and plan:  # Hypertension Very elevated today, however no evidence of endorgan damage Recommended continuing restarting with lisinopril, he's been on this again for 4 days Also restart chlorthalidone Recheck with a nursing visit in one week, follow-up with me in one month Continue metoprolol, I would prefer staying away from clonidine for him  # Hyperglycemia, type 2 diabetes Decreasing glipizide to 5 mg Continue current dose of insulin Monitor closely, and concerned because he did not have any hypoglycemic symptoms. POC glucose today is 167    Meds ordered this encounter  Medications  . lisinopril (PRINIVIL,ZESTRIL) 40 MG tablet    Sig: Take 1 tablet (40 mg total) by mouth daily.    Dispense:  90 tablet    Refill:  [place], MD [place] 12/16/2015, 3:15 PM

## 2015-12-16 NOTE — Patient Instructions (Signed)
Great to see you!  Start chlorthalidone, continue lisinopril, come back next week for a blood pressure check  If you develop headache, chest pain, or weakness during this time seek medical attention right away.   Hypertension Hypertension, commonly called high blood pressure, is when the force of blood pumping through your arteries is too strong. Your arteries are the blood vessels that carry blood from your heart throughout your body. A blood pressure reading consists of a higher number over a lower number, such as 110/72. The higher number (systolic) is the pressure inside your arteries when your heart pumps. The lower number (diastolic) is the pressure inside your arteries when your heart relaxes. Ideally you want your blood pressure below 120/80. Hypertension forces your heart to work harder to pump blood. Your arteries may become narrow or stiff. Having untreated or uncontrolled hypertension can cause heart attack, stroke, kidney disease, and other problems. RISK FACTORS Some risk factors for high blood pressure are controllable. Others are not.  Risk factors you cannot control include:   Race. You may be at higher risk if you are African American.  Age. Risk increases with age.  Gender. Men are at higher risk than women before age 57 years. After age 54, women are at higher risk than men. Risk factors you can control include:  Not getting enough exercise or physical activity.  Being overweight.  Getting too much fat, sugar, calories, or salt in your diet.  Drinking too much alcohol. SIGNS AND SYMPTOMS Hypertension does not usually cause signs or symptoms. Extremely high blood pressure (hypertensive crisis) may cause headache, anxiety, shortness of breath, and nosebleed. DIAGNOSIS To check if you have hypertension, your health care provider will measure your blood pressure while you are seated, with your arm held at the level of your heart. It should be measured at least twice  using the same arm. Certain conditions can cause a difference in blood pressure between your right and left arms. A blood pressure reading that is higher than normal on one occasion does not mean that you need treatment. If it is not clear whether you have high blood pressure, you may be asked to return on a different day to have your blood pressure checked again. Or, you may be asked to monitor your blood pressure at home for 1 or more weeks. TREATMENT Treating high blood pressure includes making lifestyle changes and possibly taking medicine. Living a healthy lifestyle can help lower high blood pressure. You may need to change some of your habits. Lifestyle changes may include:  Following the DASH diet. This diet is high in fruits, vegetables, and whole grains. It is low in salt, red meat, and added sugars.  Keep your sodium intake below 2,300 mg per day.  Getting at least 30-45 minutes of aerobic exercise at least 4 times per week.  Losing weight if necessary.  Not smoking.  Limiting alcoholic beverages.  Learning ways to reduce stress. Your health care provider may prescribe medicine if lifestyle changes are not enough to get your blood pressure under control, and if one of the following is true:  You are 30-86 years of age and your systolic blood pressure is above 140.  You are 10 years of age or older, and your systolic blood pressure is above 150.  Your diastolic blood pressure is above 90.  You have diabetes, and your systolic blood pressure is over 397 or your diastolic blood pressure is over 90.  You have kidney disease and your  blood pressure is above 140/90.  You have heart disease and your blood pressure is above 140/90. Your personal target blood pressure may vary depending on your medical conditions, your age, and other factors. HOME CARE INSTRUCTIONS  Have your blood pressure rechecked as directed by your health care provider.   Take medicines only as directed by  your health care provider. Follow the directions carefully. Blood pressure medicines must be taken as prescribed. The medicine does not work as well when you skip doses. Skipping doses also puts you at risk for problems.  Do not smoke.   Monitor your blood pressure at home as directed by your health care provider. SEEK MEDICAL CARE IF:   You think you are having a reaction to medicines taken.  You have recurrent headaches or feel dizzy.  You have swelling in your ankles.  You have trouble with your vision. SEEK IMMEDIATE MEDICAL CARE IF:  You develop a severe headache or confusion.  You have unusual weakness, numbness, or feel faint.  You have severe chest or abdominal pain.  You vomit repeatedly.  You have trouble breathing. MAKE SURE YOU:   Understand these instructions.  Will watch your condition.  Will get help right away if you are not doing well or get worse.   This information is not intended to replace advice given to you by your health care provider. Make sure you discuss any questions you have with your health care provider.   Document Released: 06/19/2005 Document Revised: 11/03/2014 Document Reviewed: 04/11/2013 Elsevier Interactive Patient Education [employer].

## 2015-12-21 ENCOUNTER — Ambulatory Visit: Payer: Commercial Managed Care - HMO

## 2015-12-21 ENCOUNTER — Ambulatory Visit (HOSPITAL_COMMUNITY)
Admission: RE | Admit: 2015-12-21 | Discharge: 2015-12-21 | Disposition: A | Discharge status: Home / Self Care | Payer: Commercial Managed Care - HMO | Source: Ambulatory Visit | Attending: Hematology & Oncology | Admitting: Hematology & Oncology

## 2015-12-21 VITALS — BP 195/105 | HR 84

## 2015-12-21 DIAGNOSIS — C3491 Malignant neoplasm of unspecified part of right bronchus or lung: Secondary | ICD-10-CM | POA: Diagnosis not present

## 2015-12-21 DIAGNOSIS — R911 Solitary pulmonary nodule: Secondary | ICD-10-CM | POA: Insufficient documentation

## 2015-12-21 DIAGNOSIS — J90 Pleural effusion, not elsewhere classified: Secondary | ICD-10-CM | POA: Diagnosis not present

## 2015-12-21 DIAGNOSIS — D3501 Benign neoplasm of right adrenal gland: Secondary | ICD-10-CM | POA: Diagnosis not present

## 2015-12-21 DIAGNOSIS — I2510 Atherosclerotic heart disease of native coronary artery without angina pectoris: Secondary | ICD-10-CM | POA: Insufficient documentation

## 2015-12-21 DIAGNOSIS — I10 Essential (primary) hypertension: Secondary | ICD-10-CM

## 2015-12-21 DIAGNOSIS — J439 Emphysema, unspecified: Secondary | ICD-10-CM | POA: Insufficient documentation

## 2015-12-21 DIAGNOSIS — C3432 Malignant neoplasm of lower lobe, left bronchus or lung: Secondary | ICD-10-CM | POA: Diagnosis not present

## 2015-12-21 LAB — POCT I-STAT CREATININE: Creatinine, Ser: 0.8 mg/dL (ref 0.61–1.24)

## 2015-12-21 MED ORDER — IOPAMIDOL (ISOVUE-300) INJECTION 61%
75.0000 mL | Freq: Once | INTRAVENOUS | Status: AC | PRN
  Administered 2015-12-21: 75 mL via INTRAVENOUS

## 2015-12-21 NOTE — Progress Notes (Signed)
Patient returns to clinic today for a recheck of BP per Dr [name]. His reading today is 195/105 with a pulse of 84. Discussed with Dr [name] and he instructed patient to continue with meds and return in 1 week for a BP check again. Patient verbalized understanding.

## 2015-12-22 ENCOUNTER — Ambulatory Visit (HOSPITAL_COMMUNITY): Payer: Commercial Managed Care - HMO

## 2015-12-27 NOTE — Patient Instructions (Addendum)
Your procedure is scheduled on: 01/03/2016  Report to [facility] at  750  AM.  Call this number if you have problems the morning of surgery: [phone]   Do not eat food or drink liquids :After Midnight.      Take these medicines the morning of surgery with A SIP OF WATER: levothyroxine, lisinopril, metoprolol, prilosec. DO NOT take any medicines for diabetes the morning of surgery.   Do not wear jewelry, make-up or nail polish.  Do not wear lotions, powders, or perfumes. You may wear deodorant.  Do not shave 48 hours prior to surgery.  Do not bring valuables to the hospital.  Contacts, dentures or bridgework may not be worn into surgery.  Leave suitcase in the car. After surgery it may be brought to your room.  For patients admitted to the hospital, checkout time is 11:00 AM the day of discharge.   Patients discharged the day of surgery will not be allowed to drive home.  :     Please read over the following fact sheets that you were given: Coughing and Deep Breathing, Surgical Site Infection Prevention, Anesthesia Post-op Instructions and Care and Recovery After Surgery    Cataract A cataract is a clouding of the lens of the eye. When a lens becomes cloudy, vision is reduced based on the degree and nature of the clouding. Many cataracts reduce vision to some degree. Some cataracts make people more near-sighted as they develop. Other cataracts increase glare. Cataracts that are ignored and become worse can sometimes look white. The white color can be seen through the pupil. CAUSES   Aging. However, cataracts may occur at any age, even in newborns.   Certain drugs.   Trauma to the eye.   Certain diseases such as diabetes.   Specific eye diseases such as chronic inflammation inside the eye or a sudden attack of a rare form of glaucoma.   Inherited or acquired medical problems.  SYMPTOMS   Gradual, progressive drop in vision in the affected eye.   Severe, rapid visual loss. This  most often happens when trauma is the cause.  DIAGNOSIS  To detect a cataract, an eye doctor examines the lens. Cataracts are best diagnosed with an exam of the eyes with the pupils enlarged (dilated) by drops.  TREATMENT  For an early cataract, vision may improve by using different eyeglasses or stronger lighting. If that does not help your vision, surgery is the only effective treatment. A cataract needs to be surgically removed when vision loss interferes with your everyday activities, such as driving, reading, or watching TV. A cataract may also have to be removed if it prevents examination or treatment of another eye problem. Surgery removes the cloudy lens and usually replaces it with a substitute lens (intraocular lens, IOL).  At a time when both you and your doctor agree, the cataract will be surgically removed. If you have cataracts in both eyes, only one is usually removed at a time. This allows the operated eye to heal and be out of danger from any possible problems after surgery (such as infection or poor wound healing). In rare cases, a cataract may be doing damage to your eye. In these cases, your caregiver may advise surgical removal right away. The vast majority of people who have cataract surgery have better vision afterward. HOME CARE INSTRUCTIONS  If you are not planning surgery, you may be asked to do the following:  Use different eyeglasses.   Use stronger or  brighter lighting.   Ask your eye doctor about reducing your medicine dose or changing medicines if it is thought that a medicine caused your cataract. Changing medicines does not make the cataract go away on its own.   Become familiar with your surroundings. Poor vision can lead to injury. Avoid bumping into things on the affected side. You are at a higher risk for tripping or falling.   Exercise extreme care when driving or operating machinery.   Wear sunglasses if you are sensitive to bright light or experiencing  problems with glare.  SEEK IMMEDIATE MEDICAL CARE IF:   You have a worsening or sudden vision loss.   You notice redness, swelling, or increasing pain in the eye.   You have a fever.  Document Released: 06/19/2005 Document Revised: 06/08/2011 Document Reviewed: 02/10/2011 [facility] Patient Information 2012 [place].PATIENT INSTRUCTIONS POST-ANESTHESIA  IMMEDIATELY FOLLOWING SURGERY:  Do not drive or operate machinery for the first twenty four hours after surgery.  Do not make any important decisions for twenty four hours after surgery or while taking narcotic pain medications or sedatives.  If you develop intractable nausea and vomiting or a severe headache please notify your doctor immediately.  FOLLOW-UP:  Please make an appointment with your surgeon as instructed. You do not need to follow up with anesthesia unless specifically instructed to do so.  WOUND CARE INSTRUCTIONS (if applicable):  Keep a dry clean dressing on the anesthesia/puncture wound site if there is drainage.  Once the wound has quit draining you may leave it open to air.  Generally you should leave the bandage intact for twenty four hours unless there is drainage.  If the epidural site drains for more than 36-48 hours please call the anesthesia department.  QUESTIONS?:  Please feel free to call your physician or the hospital operator if you have any questions, and they will be happy to assist you.

## 2015-12-28 ENCOUNTER — Encounter (HOSPITAL_COMMUNITY)
Admission: RE | Admit: 2015-12-28 | Discharge: 2015-12-28 | Disposition: A | Discharge status: Home / Self Care | Payer: No Typology Code available for payment source | Source: Ambulatory Visit | Attending: Ophthalmology | Admitting: Ophthalmology

## 2015-12-28 ENCOUNTER — Encounter (HOSPITAL_COMMUNITY): Payer: Self-pay

## 2015-12-28 DIAGNOSIS — Z01812 Encounter for preprocedural laboratory examination: Secondary | ICD-10-CM | POA: Diagnosis not present

## 2015-12-28 DIAGNOSIS — H269 Unspecified cataract: Secondary | ICD-10-CM | POA: Insufficient documentation

## 2015-12-28 HISTORY — DX: Unspecified osteoarthritis, unspecified site: M19.90

## 2015-12-28 LAB — CBC WITH DIFFERENTIAL/PLATELET
Basophils Absolute: 0 10*3/uL (ref 0.0–0.1)
Basophils Relative: 1 %
Eosinophils Absolute: 0.2 10*3/uL (ref 0.0–0.7)
Eosinophils Relative: 3 %
HCT: 32.9 % — ABNORMAL LOW (ref 39.0–52.0)
Hemoglobin: 10.9 g/dL — ABNORMAL LOW (ref 13.0–17.0)
Lymphocytes Relative: 16 %
Lymphs Abs: 1.1 10*3/uL (ref 0.7–4.0)
MCH: 29.2 pg (ref 26.0–34.0)
MCHC: 33.1 g/dL (ref 30.0–36.0)
MCV: 88.2 fL (ref 78.0–100.0)
Monocytes Absolute: 0.7 10*3/uL (ref 0.1–1.0)
Monocytes Relative: 11 %
Neutro Abs: 4.5 10*3/uL (ref 1.7–7.7)
Neutrophils Relative %: 69 %
Platelets: 286 10*3/uL (ref 150–400)
RBC: 3.73 MIL/uL — ABNORMAL LOW (ref 4.22–5.81)
RDW: 14.5 % (ref 11.5–15.5)
WBC: 6.5 10*3/uL (ref 4.0–10.5)

## 2015-12-28 LAB — BASIC METABOLIC PANEL
Anion gap: 3 — ABNORMAL LOW (ref 5–15)
BUN: 13 mg/dL (ref 6–20)
CO2: 29 mmol/L (ref 22–32)
Calcium: 9 mg/dL (ref 8.9–10.3)
Chloride: 107 mmol/L (ref 101–111)
Creatinine, Ser: 0.95 mg/dL (ref 0.61–1.24)
GFR calc Af Amer: >60 mL/min (ref >60)
GFR calc non Af Amer: >60 mL/min (ref >60)
Glucose, Bld: 254 mg/dL — ABNORMAL HIGH (ref 65–99)
Potassium: 4.1 mmol/L (ref 3.5–5.1)
Sodium: 139 mmol/L (ref 135–145)

## 2015-12-28 NOTE — Pre-Procedure Instructions (Signed)
Patient given information to sign up for my chart at home. 

## 2015-12-28 NOTE — Pre-Procedure Instructions (Signed)
BP 205/95. Patient states PCP, Dr [name] is aware of this, he was just there recently. They are in the process of of regulating this. He is going to go by Dr Alen Bleacher office and let him know about BP and upcoming surgery.

## 2015-12-30 ENCOUNTER — Ambulatory Visit (INDEPENDENT_AMBULATORY_CARE_PROVIDER_SITE_OTHER): Payer: Commercial Managed Care - HMO | Admitting: *Deleted

## 2015-12-30 VITALS — BP 181/84 | HR 81

## 2015-12-30 DIAGNOSIS — I10 Essential (primary) hypertension: Secondary | ICD-10-CM

## 2015-12-30 MED ORDER — CLONIDINE HCL 0.1 MG PO TABS: 0.1000 mg | ORAL_TABLET | Freq: Two times a day (BID) | ORAL | Status: DC

## 2015-12-30 NOTE — Patient Instructions (Signed)
Hypertension Hypertension, commonly called high blood pressure, is when the force of blood pumping through your arteries is too strong. Your arteries are the blood vessels that carry blood from your heart throughout your body. A blood pressure reading consists of a higher number over a lower number, such as 110/72. The higher number (systolic) is the pressure inside your arteries when your heart pumps. The lower number (diastolic) is the pressure inside your arteries when your heart relaxes. Ideally you want your blood pressure below 120/80. Hypertension forces your heart to work harder to pump blood. Your arteries may become narrow or stiff. Having untreated or uncontrolled hypertension can cause heart attack, stroke, kidney disease, and other problems. RISK FACTORS Some risk factors for high blood pressure are controllable. Others are not.  Risk factors you cannot control include:   Race. You may be at higher risk if you are African American.  Age. Risk increases with age.  Gender. Men are at higher risk than women before age 45 years. After age 65, women are at higher risk than men. Risk factors you can control include:  Not getting enough exercise or physical activity.  Being overweight.  Getting too much fat, sugar, calories, or salt in your diet.  Drinking too much alcohol. SIGNS AND SYMPTOMS Hypertension does not usually cause signs or symptoms. Extremely high blood pressure (hypertensive crisis) may cause headache, anxiety, shortness of breath, and nosebleed. DIAGNOSIS To check if you have hypertension, your health care provider will measure your blood pressure while you are seated, with your arm held at the level of your heart. It should be measured at least twice using the same arm. Certain conditions can cause a difference in blood pressure between your right and left arms. A blood pressure reading that is higher than normal on one occasion does not mean that you need treatment. If  it is not clear whether you have high blood pressure, you may be asked to return on a different day to have your blood pressure checked again. Or, you may be asked to monitor your blood pressure at home for 1 or more weeks. TREATMENT Treating high blood pressure includes making lifestyle changes and possibly taking medicine. Living a healthy lifestyle can help lower high blood pressure. You may need to change some of your habits. Lifestyle changes may include:  Following the DASH diet. This diet is high in fruits, vegetables, and whole grains. It is low in salt, red meat, and added sugars.  Keep your sodium intake below 2,300 mg per day.  Getting at least 30-45 minutes of aerobic exercise at least 4 times per week.  Losing weight if necessary.  Not smoking.  Limiting alcoholic beverages.  Learning ways to reduce stress. Your health care provider may prescribe medicine if lifestyle changes are not enough to get your blood pressure under control, and if one of the following is true:  You are 18-59 years of age and your systolic blood pressure is above 140.  You are 60 years of age or older, and your systolic blood pressure is above 150.  Your diastolic blood pressure is above 90.  You have diabetes, and your systolic blood pressure is over 140 or your diastolic blood pressure is over 90.  You have kidney disease and your blood pressure is above 140/90.  You have heart disease and your blood pressure is above 140/90. Your personal target blood pressure may vary depending on your medical conditions, your age, and other factors. HOME CARE INSTRUCTIONS    Have your blood pressure rechecked as directed by your health care provider.   Take medicines only as directed by your health care provider. Follow the directions carefully. Blood pressure medicines must be taken as prescribed. The medicine does not work as well when you skip doses. Skipping doses also puts you at risk for  problems.  Do not smoke.   Monitor your blood pressure at home as directed by your health care provider. SEEK MEDICAL CARE IF:   You think you are having a reaction to medicines taken.  You have recurrent headaches or feel dizzy.  You have swelling in your ankles.  You have trouble with your vision. SEEK IMMEDIATE MEDICAL CARE IF:  You develop a severe headache or confusion.  You have unusual weakness, numbness, or feel faint.  You have severe chest or abdominal pain.  You vomit repeatedly.  You have trouble breathing. MAKE SURE YOU:   Understand these instructions.  Will watch your condition.  Will get help right away if you are not doing well or get worse.   This information is not intended to replace advice given to you by your health care provider. Make sure you discuss any questions you have with your health care provider.   Document Released: 06/19/2005 Document Revised: 11/03/2014 Document Reviewed: 04/11/2013 Elsevier Interactive Patient Education 2016 Elsevier Inc[employer]  

## 2015-12-30 NOTE — Progress Notes (Signed)
Patient is here today for a BP check. Patients BP 181/84 mmHg  Pulse 81. Patient is going to go home and call me back to verify which BP medication he is taking.  Patient is taking chlorthalidone '25mg'$  daily, metoprolol xl '100mg'$  daily and lisinopril '40mg'$  daily.   Per Dr. [name] have patient start back on clonidine 0.'1mg'$  BID. Patient has some left at home and will go ahead and start it back today and I went ahead and sent a refill to the pharmacy for patient. Patient advised to monitor BP and to call us if he has any questions or problems.

## 2015-12-31 MED ORDER — LIDOCAINE HCL 3.5 % OP GEL
OPHTHALMIC | Status: AC
  Filled 2015-12-31: qty 1

## 2015-12-31 MED ORDER — CYCLOPENTOLATE-PHENYLEPHRINE OP SOLN OPTIME - NO CHARGE
OPHTHALMIC | Status: AC
Start: 2015-12-31 — End: 2015-12-31
  Filled 2015-12-31: qty 2

## 2015-12-31 MED ORDER — TETRACAINE HCL 0.5 % OP SOLN
OPHTHALMIC | Status: AC
  Filled 2015-12-31: qty 4

## 2016-01-03 ENCOUNTER — Ambulatory Visit (HOSPITAL_COMMUNITY): Payer: No Typology Code available for payment source | Admitting: Anesthesiology

## 2016-01-03 ENCOUNTER — Encounter (HOSPITAL_COMMUNITY): Payer: Self-pay | Admitting: *Deleted

## 2016-01-03 ENCOUNTER — Ambulatory Visit (HOSPITAL_COMMUNITY)
Admission: RE | Admit: 2016-01-03 | Discharge: 2016-01-03 | Disposition: A | Discharge status: Home / Self Care | Payer: No Typology Code available for payment source | Source: Ambulatory Visit | Attending: Ophthalmology | Admitting: Ophthalmology

## 2016-01-03 ENCOUNTER — Encounter (HOSPITAL_COMMUNITY)
Admission: RE | Disposition: A | Discharge status: Home / Self Care | Payer: Self-pay | Source: Ambulatory Visit | Attending: Ophthalmology

## 2016-01-03 DIAGNOSIS — H538 Other visual disturbances: Secondary | ICD-10-CM | POA: Diagnosis not present

## 2016-01-03 DIAGNOSIS — Z87891 Personal history of nicotine dependence: Secondary | ICD-10-CM | POA: Diagnosis not present

## 2016-01-03 DIAGNOSIS — I2510 Atherosclerotic heart disease of native coronary artery without angina pectoris: Secondary | ICD-10-CM | POA: Diagnosis not present

## 2016-01-03 DIAGNOSIS — H269 Unspecified cataract: Secondary | ICD-10-CM | POA: Diagnosis not present

## 2016-01-03 DIAGNOSIS — G4730 Sleep apnea, unspecified: Secondary | ICD-10-CM | POA: Insufficient documentation

## 2016-01-03 DIAGNOSIS — E119 Type 2 diabetes mellitus without complications: Secondary | ICD-10-CM | POA: Insufficient documentation

## 2016-01-03 DIAGNOSIS — Z7984 Long term (current) use of oral hypoglycemic drugs: Secondary | ICD-10-CM | POA: Insufficient documentation

## 2016-01-03 DIAGNOSIS — H2512 Age-related nuclear cataract, left eye: Secondary | ICD-10-CM | POA: Diagnosis not present

## 2016-01-03 DIAGNOSIS — Z79899 Other long term (current) drug therapy: Secondary | ICD-10-CM | POA: Diagnosis not present

## 2016-01-03 HISTORY — PX: CATARACT EXTRACTION W/PHACO: SHX586

## 2016-01-03 LAB — GLUCOSE, CAPILLARY: Glucose-Capillary: 236 mg/dL — ABNORMAL HIGH (ref 65–99)

## 2016-01-03 SURGERY — PHACOEMULSIFICATION, CATARACT, WITH IOL INSERTION
Anesthesia: Monitor Anesthesia Care | Laterality: Left

## 2016-01-03 MED ORDER — LACTATED RINGERS IV SOLN
INTRAVENOUS | Status: DC
  Administered 2016-01-03: 09:00:00 via INTRAVENOUS

## 2016-01-03 MED ORDER — MIDAZOLAM HCL 2 MG/2ML IJ SOLN
INTRAMUSCULAR | Status: AC
  Filled 2016-01-03: qty 2

## 2016-01-03 MED ORDER — FENTANYL CITRATE (PF) 100 MCG/2ML IJ SOLN
INTRAMUSCULAR | Status: AC
  Filled 2016-01-03: qty 2

## 2016-01-03 MED ORDER — LIDOCAINE HCL 3.5 % OP GEL: 1.0000 "application " | Freq: Once | OPHTHALMIC | Status: DC

## 2016-01-03 MED ORDER — BSS IO SOLN
INTRAOCULAR | Status: DC | PRN
  Administered 2016-01-03: 15 mL

## 2016-01-03 MED ORDER — FENTANYL CITRATE (PF) 100 MCG/2ML IJ SOLN
25.0000 ug | INTRAMUSCULAR | Status: DC | PRN
  Administered 2016-01-03: 25 ug via INTRAVENOUS

## 2016-01-03 MED ORDER — TETRACAINE 0.5 % OP SOLN OPTIME - NO CHARGE
OPHTHALMIC | Status: DC | PRN
  Administered 2016-01-03: 2 [drp] via OPHTHALMIC

## 2016-01-03 MED ORDER — TETRACAINE HCL 0.5 % OP SOLN
1.0000 [drp] | OPHTHALMIC | Status: AC
  Administered 2016-01-03 (×3): 1 [drp] via OPHTHALMIC

## 2016-01-03 MED ORDER — CYCLOPENTOLATE-PHENYLEPHRINE 0.2-1 % OP SOLN
1.0000 [drp] | OPHTHALMIC | Status: AC
  Administered 2016-01-03 (×3): 1 [drp] via OPHTHALMIC

## 2016-01-03 MED ORDER — LIDOCAINE HCL 3.5 % OP GEL
OPHTHALMIC | Status: DC | PRN
  Administered 2016-01-03: 1 via OPHTHALMIC

## 2016-01-03 MED ORDER — PHENYLEPHRINE-KETOROLAC 1-0.3 % IO SOLN
INTRAOCULAR | Status: DC | PRN
  Administered 2016-01-03: 500 mL via OPHTHALMIC

## 2016-01-03 MED ORDER — MIDAZOLAM HCL 2 MG/2ML IJ SOLN
1.0000 mg | INTRAMUSCULAR | Status: DC | PRN
  Administered 2016-01-03: 2 mg via INTRAVENOUS

## 2016-01-03 MED ORDER — NA HYALUR & NA CHOND-NA HYALUR 0.55-0.5 ML IO KIT
PACK | INTRAOCULAR | Status: DC | PRN
  Administered 2016-01-03: 1 via OPHTHALMIC

## 2016-01-03 MED ORDER — PHENYLEPHRINE-KETOROLAC 1-0.3 % IO SOLN
INTRAOCULAR | Status: AC
  Filled 2016-01-03: qty 4

## 2016-01-03 MED ORDER — POVIDONE-IODINE 5 % OP SOLN
OPHTHALMIC | Status: DC | PRN
  Administered 2016-01-03: 1 via OPHTHALMIC

## 2016-01-03 SURGICAL SUPPLY — 10 items
CLOTH BEACON ORANGE TIMEOUT ST (SAFETY) ×2 IMPLANT
GLOVE BIO SURGEON STRL SZ7.5 (GLOVE) ×2 IMPLANT
GLOVE BIOGEL PI IND STRL 6.5 (GLOVE) ×1 IMPLANT
GLOVE BIOGEL PI IND STRL 7.0 (GLOVE) ×1 IMPLANT
GLOVE BIOGEL PI INDICATOR 6.5 (GLOVE) ×1
GLOVE BIOGEL PI INDICATOR 7.0 (GLOVE) ×1
INST SET CATARACT ~~LOC~~ (KITS) ×2 IMPLANT
LENS ALC ACRYL/TECN (Ophthalmic Related) ×2 IMPLANT
PAD ARMBOARD 7.5X6 YLW CONV (MISCELLANEOUS) ×2 IMPLANT
WATER STERILE IRR 250ML POUR (IV SOLUTION) ×2 IMPLANT

## 2016-01-03 NOTE — Anesthesia Procedure Notes (Signed)
Procedure Name: MAC Date/Time: 01/03/2016 9:32 AM Performed by: Vista Deck Pre-anesthesia Checklist: Patient identified, Emergency Drugs available, Suction available, Timeout performed and Patient being monitored Patient Re-evaluated:Patient Re-evaluated prior to inductionOxygen Delivery Method: Nasal Cannula

## 2016-01-03 NOTE — OR Nursing (Signed)
Hearing aids to left ear and glasses given to friend

## 2016-01-03 NOTE — Discharge Instructions (Signed)
SANUEL LADNIER 01/03/2016 Dr. Iona Hansen Post operative Instructions for Cataract Patients  These instructions are for [name] and pertain to the operative eye.  1.  Resume your normal diet and previous oral medicines.  2. Your Follow-up appointment is at Dr. Iona Hansen' office in [place] on 01/05/16 @ 8:15  3. You may leave the hospital when your driver is present and your nurse releases you.  4. Begin Pred Forte (prednisolone acetate 1%), Acular LS (ketorolac tromethamine .4%) and Gatifloxacin 0.5% eye drops; 1 drop each 4 times daily to operative eye. Begin 3 hours after discharge from Short Stay Unit.  Moxifloxacin 0.5% may be substituted for Gatifloxacin using the same instructions.  52. Page Dr. Iona Hansen via beeper [phone] for significant pain in or around operative eye that is not relieved by Tylenol.  6. If you took Plavix before surgery, restart it at the usual dose on the evening of surgery.  7. Wear dark glasses as necessary for excessive light sensitivity.  8. Do no forcefully rub you your operative eye.  9. Keep your operative eye dry for 1 week. You may gently clean your eyelids with a damp washcloth.  10. You may resume normal occupational activities in one week and resume driving as tolerated after the first post operative visit.  11. It is normal to have blurred vision and a scratchy sensation following surgery.  Dr. Iona Hansen: [phone]  PATIENT INSTRUCTIONS POST-ANESTHESIA  IMMEDIATELY FOLLOWING SURGERY:  Do not drive or operate machinery for the first twenty four hours after surgery.  Do not make any important decisions for twenty four hours after surgery or while taking narcotic pain medications or sedatives.  If you develop intractable nausea and vomiting or a severe headache please notify your doctor immediately.  FOLLOW-UP:  Please make an appointment with your surgeon as instructed. You do not need to follow up with anesthesia unless specifically instructed to do  so.  WOUND CARE INSTRUCTIONS (if applicable):  Keep a dry clean dressing on the anesthesia/puncture wound site if there is drainage.  Once the wound has quit draining you may leave it open to air.  Generally you should leave the bandage intact for twenty four hours unless there is drainage.  If the epidural site drains for more than 36-48 hours please call the anesthesia department.  QUESTIONS?:  Please feel free to call your physician or the hospital operator if you have any questions, and they will be happy to assist you.

## 2016-01-03 NOTE — Op Note (Signed)
01/03/2016  11:10 AM  PATIENT:  Timothy Guerrero  75 y.o. male  PRE-OPERATIVE DIAGNOSIS:  nuclear cataract left eye  POST-OPERATIVE DIAGNOSIS:  nuclear cataract left eye  PROCEDURE:  Procedure(s): CATARACT EXTRACTION PHACO AND INTRAOCULAR LENS PLACEMENT LEFT EYE CDE=7.78  SURGEON:  Surgeon(s): Williams Che, MD  ASSISTANTS: Zoila Shutter, CST   ANESTHESIA STAFF: Anesthesiologist: Lerry Liner, MD CRNA: Vista Deck, CRNA  ANESTHESIA:   topical and MAC  REQUESTED LENS POWER: 18.0  LENS IMPLANT INFORMATION:   Alcon [id number]  S/n [id number]  Exp 05/23021   +18.0  CUMULATIVE DISSIPATED ENERGY:7.78  INDICATIONS:see scanned office H&P  OP FINDINGS:dense NS  COMPLICATIONS:None  PROCEDURE:  The patient was brought to the operating room in good condition.  The operative eye was prepped and draped in the usual fashion for intraocular surgery.  Lidocaine gel was dropped onto the eye.  A 2.4 mm 10 O'clock near clear corneal stepped incision and a 12 O'clock stab incision were created.  Viscoat was instilled into the anterior chamber.  The 5 mm anterior capsulorhexis was performed with a bent needle cystotome and Utrata forceps.  The lens was hydrodissected and hydrodelineated with a cannula and balanced salt solution and rotated with a Kuglen hook.  Phacoemulsification was perfomed in the divide and conquer technique.  The remaining cortex was removed with I&A and the capsular surfaces polished as necessary.  Provisc was placed into the capsular bag and the lens inserted with the Alcon inserter.  The viscoelastic was removed with I&A and the lens "rocked" into position.  The wounds were hydrated and te anterior chamber was refilled with balanced salt solution.  The wounds were checked for leakage and rehydrated as necessary.  The lid speculum and drapes were removed and the patient was transported to short stay in good condition.  PATIENT DISPOSITION:  Short Stay

## 2016-01-03 NOTE — Transfer of Care (Signed)
Immediate Anesthesia Transfer of Care Note  Patient: Timothy Guerrero  Procedure(s) Performed: Procedure(s) (LRB): CATARACT EXTRACTION PHACO AND INTRAOCULAR LENS PLACEMENT LEFT EYE CDE=7.78 (Left)  Patient Location: Shortstay  Anesthesia Type: MAC  Level of Consciousness: awake  Airway & Oxygen Therapy: Patient Spontanous Breathing   Post-op Assessment: Report given to PACU RN, Post -op Vital signs reviewed and stable and Patient moving all extremities  Post vital signs: Reviewed and stable  Complications: No apparent anesthesia complications

## 2016-01-03 NOTE — Brief Op Note (Signed)
01/03/2016  11:10 AM  PATIENT:  Timothy Guerrero  75 y.o. male  PRE-OPERATIVE DIAGNOSIS:  nuclear cataract left eye  POST-OPERATIVE DIAGNOSIS:  nuclear cataract left eye  PROCEDURE:  Procedure(s): CATARACT EXTRACTION PHACO AND INTRAOCULAR LENS PLACEMENT LEFT EYE CDE=7.78  SURGEON:  Surgeon(s): Williams Che, MD  ASSISTANTS: Zoila Shutter, CST   ANESTHESIA STAFF: Anesthesiologist: Lerry Liner, MD CRNA: Vista Deck, CRNA  ANESTHESIA:   topical and MAC  REQUESTED LENS POWER: 18.0  LENS IMPLANT INFORMATION:   Alcon SN60WF  S/n [id number]  Exp 05/23021   +18.0  CUMULATIVE DISSIPATED ENERGY:7.78  INDICATIONS:see scanned office H&P  OP FINDINGS:dense NS  COMPLICATIONS:None  DICTATION #: none  PLAN OF CARE: as above  PATIENT DISPOSITION:  Short Stay

## 2016-01-03 NOTE — Anesthesia Preprocedure Evaluation (Signed)
Anesthesia Evaluation  Patient identified by MRN, date of birth, ID band Patient awake    Reviewed: Allergy & Precautions, NPO status , Patient's Chart, lab work & pertinent test results, reviewed documented beta blocker date and time   Airway Mallampati: II  TM Distance: >3 FB Neck ROM: Full    Dental  (+) Poor Dentition, Partial Lower, Missing, Dental Advisory Given   Pulmonary shortness of breath, sleep apnea , former smoker,  RL Lobectomy for CA   breath sounds clear to auscultation       Cardiovascular hypertension, Pt. on medications and Pt. on home beta blockers + CAD  + dysrhythmias Atrial Fibrillation  Rhythm:Regular Rate:Normal     Neuro/Psych negative neurological ROS     GI/Hepatic negative GI ROS, Neg liver ROS,   Endo/Other  diabetes, Type 2, Oral Hypoglycemic Agents  Renal/GU negative Renal ROS     Musculoskeletal   Abdominal   Peds  Hematology negative hematology ROS (+)   Anesthesia Other Findings   Reproductive/Obstetrics                             Anesthesia Physical Anesthesia Plan  ASA: III  Anesthesia Plan: MAC   Post-op Pain Management:    Induction: Intravenous  Airway Management Planned: Nasal Cannula  Additional Equipment:   Intra-op Plan:   Post-operative Plan:   Informed Consent: I have reviewed the patients History and Physical, chart, labs and discussed the procedure including the risks, benefits and alternatives for the proposed anesthesia with the patient or authorized representative who has indicated his/her understanding and acceptance.     Plan Discussed with:   Anesthesia Plan Comments:         Anesthesia Quick Evaluation

## 2016-01-03 NOTE — Anesthesia Postprocedure Evaluation (Signed)
  Anesthesia Post-op Note  Patient: Timothy Guerrero  Procedure(s) Performed: Procedure(s) (LRB): CATARACT EXTRACTION PHACO AND INTRAOCULAR LENS PLACEMENT LEFT EYE CDE=7.78 (Left)  Patient Location:  Short Stay  Anesthesia Type: MAC  Level of Consciousness: awake  Airway and Oxygen Therapy: Patient Spontanous Breathing  Post-op Pain: none  Post-op Assessment: Post-op Vital signs reviewed, Patient's Cardiovascular Status Stable, Respiratory Function Stable, Patent Airway, No signs of Nausea or vomiting and Pain level controlled  Post-op Vital Signs: Reviewed and stable  Complications: No apparent anesthesia complications

## 2016-01-03 NOTE — H&P (Signed)
I have reviewed the pre printed H&P, the patient was re-examined, and I have identified no significant interval changes in the patient's medical condition.  There is no change in the plan of care since the history and physical of record. 

## 2016-01-11 ENCOUNTER — Encounter: Payer: Self-pay | Admitting: Thoracic Surgery (Cardiothoracic Vascular Surgery)

## 2016-01-11 ENCOUNTER — Other Ambulatory Visit: Payer: Self-pay | Admitting: Family Medicine

## 2016-01-11 ENCOUNTER — Ambulatory Visit (INDEPENDENT_AMBULATORY_CARE_PROVIDER_SITE_OTHER): Payer: Commercial Managed Care - HMO | Admitting: Thoracic Surgery (Cardiothoracic Vascular Surgery)

## 2016-01-11 ENCOUNTER — Ambulatory Visit: Payer: Commercial Managed Care - HMO | Admitting: Thoracic Surgery (Cardiothoracic Vascular Surgery)

## 2016-01-11 VITALS — BP 235/116 | HR 94 | Resp 16 | Ht 71.0 in | Wt 192.0 lb

## 2016-01-11 DIAGNOSIS — C3431 Malignant neoplasm of lower lobe, right bronchus or lung: Secondary | ICD-10-CM

## 2016-01-11 DIAGNOSIS — C3491 Malignant neoplasm of unspecified part of right bronchus or lung: Secondary | ICD-10-CM | POA: Diagnosis not present

## 2016-01-11 DIAGNOSIS — Z902 Acquired absence of lung [part of]: Secondary | ICD-10-CM | POA: Diagnosis not present

## 2016-01-11 NOTE — Progress Notes (Signed)
[address]       [place] [address]             [phone]       HPI: Timothy Guerrero returns today for a 1 year follow-up visit.  He is a 75 year old man who had a thoracoscopic right upper lobectomy for stage IA non-small cell carcinoma a year ago. He did not require adjuvant chemotherapy. He is followed by Dr. Whitney Muse at [place] [facility].  Overall he is been feeling well. He says he has been having problems with his blood pressure lately. He has been working with Dr. [name] on that.  Over the past couple of weeks he has noted more wheezing and some subjective shortness of breath. Past Medical History  Diagnosis Date  . Diabetes mellitus without complication ([place])     10 years  . Hypertension     10 years  . Hypercholesteremia   . Colon polyp     2007, polyp with early cancer, one year f/u no residual polyp. next TCS 2010, see PSH. [facility].  . Skin melanoma ([place])   . Shortness of breath dyspnea     due to lung mass  . HOH (hard of hearing)   . Prostate cancer ([place])   . Atrial flutter ([place])     post op following lobectomy  . Lung cancer ([place])   . Cataract   . Benign essential tremor   . Sleep apnea     wears CPAP sometimes, cannot tolerate all the time.  . Arthritis         Current Outpatient Prescriptions  Medication Sig Dispense Refill  . acetaminophen (TYLENOL) 500 MG tablet Take 1 tablet (500 mg total) by mouth every 6 (six) hours as needed. 30 tablet 0  . Ascorbic Acid (VITAMIN C PO) Take 1 tablet by mouth daily.    . chlorthalidone (HYGROTON) 25 MG tablet Take 1 tablet (25 mg total) by mouth daily. 90 tablet 3  . Cholecalciferol 1000 UNITS TBDP Take 1,000 Int'l Units by mouth daily.    . cloNIDine (CATAPRES) 0.1 MG tablet Take 1 tablet (0.1 mg total) by mouth 2 (two) times daily. 180 tablet 0  . Dulaglutide (TRULICITY) 7.62 GB/1.5VV SOPN Inject 0.75 mg into the skin once a week. 12 pen 1  . glipiZIDE  (GLUCOTROL) 10 MG tablet Take 1 tablet (10 mg total) by mouth daily. 90 tablet 0  . levothyroxine (SYNTHROID, LEVOTHROID) 50 MCG tablet TAKE 1 TABLET EVERY DAY 90 tablet 3  . lisinopril (PRINIVIL,ZESTRIL) 40 MG tablet Take 1 tablet (40 mg total) by mouth daily. 90 tablet 3  . metFORMIN (GLUCOPHAGE) 1000 MG tablet Take 1,000 mg by mouth 2 (two) times daily.     . metoprolol succinate (TOPROL-XL) 100 MG 24 hr tablet Take 100 mg by mouth daily.     [name] omeprazole (PRILOSEC) 20 MG capsule Take 1 capsule (20 mg total) by mouth daily. 30 capsule 5  . tiotropium (SPIRIVA) 18 MCG inhalation capsule Place 18 mcg into inhaler and inhale daily.    . valACYclovir (VALTREX) 1000 MG tablet Take 2 tablets (2,000 mg total) by mouth 2 (two) times daily. For 1 day for cold sores 12 tablet 2  . warfarin (COUMADIN) 5 MG tablet TAKE 1 TABLET EVERY DAY 90 tablet 0   No current facility-administered medications for this visit.    Physical Exam BP 235/116 mmHg  Pulse 94  Resp 16  Ht '5\' 11"'$  (1.803 m)  Wt 192 lb (87.091 kg)  BMI 26.79 kg/m2  SpO2 98% Blood pressure on recheck 36/6 75 year old man in no acute distress, well-developed and well-nourished Alert and oriented 3 with no focal deficits Cardiac regular rate and rhythm normal S1 and S2 no murmur Lungs diminished the right base, otherwise clear, no wheezing No cervical or supraclavicular adenopathy Incisions well healed  Diagnostic Tests: CT CHEST WITH CONTRAST  TECHNIQUE: Multidetector CT imaging of the chest was performed during intravenous contrast administration.  CONTRAST: 98m ISOVUE-300 IOPAMIDOL (ISOVUE-300) INJECTION 61%  COMPARISON: 03/09/2015 chest radiograph. 10/25/2010 chest CT.  FINDINGS: Mediastinum/Nodes: Normal heart size. No significant pericardial fluid/thickening. Left main, left anterior descending, left circumflex and right coronary atherosclerosis. Atherosclerotic nonaneurysmal thoracic aorta. Normal caliber  pulmonary arteries. No central pulmonary emboli. No discrete thyroid nodules. Unremarkable esophagus. No pathologically enlarged axillary, mediastinal or hilar lymph nodes.  Lungs/Pleura: No pneumothorax. There are small calcified pleural plaques throughout the right greater than left pleural spaces. Trace right pleural effusion. No left pleural effusion. Right lower lobectomy. Minimal centrilobular and paraseptal emphysema with mild diffuse bronchial wall thickening. No acute consolidative airspace disease. Apical left upper lobe 2 mm solid pulmonary nodule (series 3/ image 20) is stable since 10/25/2010 and benign. Subpleural left lower lobe 3 mm pulmonary nodule (series 3/ image 101), not definitely seen on 10/25/2010 chest CT.  Upper abdomen: Simple 4.3 cm upper right renal cyst. Simple 1.3 cm interpolar right renal cyst. Right adrenal 1.6 cm nodule with indeterminate density 43 HU, stable in size using similar measurement technique since 10/25/2010, consistent with a benign right adrenal adenoma.  Musculoskeletal: No aggressive appearing focal osseous lesions. Moderate thoracic spine spondylosis. Partially visualized left shoulder hemiarthroplasty.  IMPRESSION: 1. No evidence of local tumor recurrence in the right lung. No definite findings of metastatic disease in the chest. 2. Solitary subpleural 3 mm left lower lobe pulmonary nodule is probably benign, however was not definitely seen on 10/25/2010 chest CT. Follow-up chest CT is advised in 6 months. 3. Calcified pleural plaques bilaterally with trace right pleural effusion, consistent with asbestos related pleural disease. 4. Left main and 3 vessel coronary atherosclerosis. 5. Minimal emphysema and mild diffuse bronchial wall thickening, which could indicate COPD. 6. Stable right adrenal adenoma.   Electronically Signed  By: JIlona SorrelM.D.  On: 12/21/2015 14:09  I personally reviewed the CT chest and  concur with the findings.  Impression: Timothy Guerrero a 75year old man who is now a year out from a thoracoscopic right upper lobectomy for stage IA non-small cell carcinoma. He is doing well at this time. He has no evidence of recurrent disease. He has no residual pain from the procedure.  His CT does show coronary artery disease. He also has a history of atrial flutter. Dr. HPercival Spanishfollows him for those issues.  My primary concern today is his blood pressure. He has hypertension. He is on multiple medications. His blood pressure initially was 235/116. He says that he had a very frustrating trip in to the office this morning getting into a construction site and running late. I recommended that he go to the emergency room, but he does not want to do that. He did approximate that he would check his blood pressure again when he gets home and contact Dr. BWendi Snipesto see if any additional medication changes are warranted.  He has an appointment to see Dr. PWhitney Musethis week.  Plan: Follow-up with Dr. BWendi Snipesregarding blood pressure  today  Follow-up with Dr. Whitney Muse is scheduled tomorrow  Going forward, he would like to follow-up with Dr. Whitney Muse. I will be happy to see Timothy Guerrero back at any time in the future if I can be of any further assistance with his care  Melrose Nakayama, MD [facility] [phone]

## 2016-01-12 ENCOUNTER — Ambulatory Visit (HOSPITAL_COMMUNITY): Payer: Commercial Managed Care - HMO | Admitting: Hematology & Oncology

## 2016-01-12 ENCOUNTER — Encounter (HOSPITAL_COMMUNITY)
Payer: No Typology Code available for payment source | Attending: Hematology & Oncology | Admitting: Hematology & Oncology

## 2016-01-12 ENCOUNTER — Encounter (HOSPITAL_COMMUNITY): Payer: Self-pay | Admitting: Hematology & Oncology

## 2016-01-12 DIAGNOSIS — D649 Anemia, unspecified: Secondary | ICD-10-CM | POA: Diagnosis not present

## 2016-01-12 DIAGNOSIS — R911 Solitary pulmonary nodule: Secondary | ICD-10-CM

## 2016-01-12 DIAGNOSIS — C3491 Malignant neoplasm of unspecified part of right bronchus or lung: Secondary | ICD-10-CM

## 2016-01-12 DIAGNOSIS — I10 Essential (primary) hypertension: Secondary | ICD-10-CM

## 2016-01-12 DIAGNOSIS — C3431 Malignant neoplasm of lower lobe, right bronchus or lung: Secondary | ICD-10-CM | POA: Diagnosis not present

## 2016-01-12 NOTE — Patient Instructions (Signed)
[place] at [facility] Discharge Instructions  RECOMMENDATIONS MADE BY THE CONSULTANT AND ANY TEST RESULTS WILL BE SENT TO YOUR REFERRING PHYSICIAN.  You were seen by Dr. Whitney Muse today. Return to clinic for follow-up in 3 months after your CT scan has been completed. Call clinic with any questions or concerns.   Thank you for choosing [place] at [facility] to provide your oncology and hematology care.  To afford each patient quality time with our provider, please arrive at least 15 minutes before your scheduled appointment time.   Beginning January 23rd 2017 lab work for the [facility] will be done in the  Main lab at [facility] on 1st floor. If you have a lab appointment with the [facility] please come in thru the  Main Entrance and check in at the main information desk  You need to re-schedule your appointment should you arrive 10 or more minutes late.  We strive to give you quality time with our providers, and arriving late affects you and other patients whose appointments are after yours.  Also, if you no show three or more times for appointments you may be dismissed from the clinic at the providers discretion.     Again, thank you for choosing [facility].  Our hope is that these requests will decrease the amount of time that you wait before being seen by our physicians.       _____________________________________________________________  Should you have questions after your visit to [facility], please contact our office at [phone] between the hours of 8:30 a.m. and 4:30 p.m.  Voicemails left after 4:30 p.m. will not be returned until the following business day.  For prescription refill requests, have your pharmacy contact our office.         Resources For Cancer Patients and their Caregivers ? American Cancer Society: Can assist with transportation, wigs, general needs, runs Look Good Feel  Better.        (318)691-5898 ? Cancer Care: Provides financial assistance, online support groups, medication/co-pay assistance.  1-800-813-HOPE 209-319-4630) ? [place] Assists [place] cancer patients and their families through emotional , educational and financial support.  902 857 3389 ? [place] DSS Where to apply for food stamps, Medicaid and utility assistance. 3462713764 ? [facility]: Transportation to medical appointments. 770-067-3937 ? Social Security Administration: May apply for disability if have a Stage IV cancer. (216)273-9596 (440)542-8200 ? [employer], Disability and Transit Services: Assists with nutrition, care and transit needs. Industry Support Programs: '@10RELATIVEDAYS'$ @ > Cancer Support Group  2nd Tuesday of the month 1pm-2pm, Journey Room  > Creative Journey  3rd Tuesday of the month 1130am-1pm, Journey Room  > Look Good Feel Better  1st Wednesday of the month 10am-12 noon, Journey Room (Call [place] to register [phone])

## 2016-01-12 NOTE — Progress Notes (Signed)
[facility] NOTE  Patient Care Team: [name], MD as PCP - General (Family Medicine) Daneil Dolin, MD as Consulting Physician (Gastroenterology) Amy Martinique, MD as Consulting Physician (Dermatology) Minus Breeding, MD as Consulting Physician (Cardiology)  CHIEF COMPLAINTS/PURPOSE OF CONSULTATION:  CT of the chest on 09/09/2014 at [facility] with stable 2 cm adrenal nodule, felt to be an adrenal adenoma on the right, pleural plaques, calcified indicating asbestos related pleural disease, 2.2 cm irregular nodule in the superior segment of the right lower lobe  PET/CT reported as showing mass in RL was hypermetabolic (not available) PET/CT showed the right lower lobe mass was hyper metabolic with an SUV of 02.7. 1 cm right paratracheal node with a fatty hilum and SUV max of 3.8. No other suspicious findings  PFT's with normal FVC and FEV1 and DLCO of 60% Remote history of tobacco abuse History of multiple melanomas Prostate Cancer in 2001, s/p prostatectomy Cardiolite in 03/2014 low risk for ischemia  R vats,  R pleural biopsy, RLL superior segmentectomy, Thorascopic RL lobectomy, Mediastinal LN dissection Poorly differentiated Squamous cell carcinoma, T1b, N0 M0  HISTORY OF PRESENTING ILLNESS:  Timothy Guerrero 75 y.o. male is here for follow-up of Stage IB squamous cell carcinoma of the R lung.  He has done well since surgery.  He has minimal to no post-operative pain.  The patient is here alone today and prefers to go by Timothy Gu[place].  Mr. Bruemmer is unaccompanied. I personally reviewed and went over laboratory and imaging studies with the patient.  He has been having problems with his blood pressure and sees Dr. [name] for this. He is scheduled for follow up with Dr. [name] tomorrow. "I'm all right except for my dern' blood pressure". He denies any headaches. Reports shortness of breath that came back in the last month. The patient  is not sure if this is weather related or not.   He has lost 20 lbs this year secondary to appetite loss and exercise. "Even when I'm eating I'm not hungry". He is also trying to lose weight. He goes to the gym 3 times weekly. When he started going to the gym he was 210 lbs. His goal weight is 180 lbs. He denies any pain. He had a colonoscopy this year.   He had cataract surgery in one eye and is doing well. His next cataract surgery is scheduled for fall of this year.  He denies new cough, no hemoptysis. He is here today to review surveillance CT imaging.                       MEDICAL HISTORY:  Past Medical History  Diagnosis Date  . Diabetes mellitus without complication ([place])     10 years  . Hypertension     10 years  . Hypercholesteremia   . Colon polyp     2007, polyp with early cancer, one year f/u no residual polyp. next TCS 2010, see P[facility]. [facility].  . Skin melanoma ([place])   . Shortness of breath dyspnea     due to lung mass  . HOH (hard of hearing)   . Prostate cancer ([place])   . Atrial flutter ([place])     post op following lobectomy  . Lung cancer ([place])   . Cataract   . Benign essential tremor   . Sleep apnea     wears CPAP sometimes, cannot  tolerate all the time.  . Arthritis     SURGICAL HISTORY: Past Surgical History  Procedure Laterality Date  . Prostate surgery      prostatectomy, radical  . Knee replacements      bilateral. over 10 years ago  . Neck surgery      melanoma removed.  . Total hip arthroplasty  2007    left  . Total shoulder replacement  2011    left  . Colonoscopy  08/2008    Dr. Leafy Half, [place]: normal, internal grade 1 hemorrhoids. next TCS 08/2013  . Colonoscopy N/A 03/04/2014    Procedure: COLONOSCOPY;  Surgeon: Daneil Dolin, MD;  [facility];  Service: Endoscopy;  Laterality: N/A;  7:30am  . Cardiac catheterization      no PCI  . Video assisted thoracoscopy (vats)/wedge resection  Right 12/17/2014    Procedure: RIGHT VIDEO ASSISTED THORACOSCOPY (VATS);  Surgeon: Melrose Nakayama, MD;  Location: [facility];  Service: Thoracic;  Laterality: Right;  . Lobectomy Right 12/17/2014    Procedure: RIGHT LOWER LOBECTOMY;  Surgeon: Melrose Nakayama, MD;  Location: [facility];  Service: Thoracic;  Laterality: Right;  . Lymph node dissection Right 12/17/2014    Procedure: LYMPH NODE DISSECTION;  Surgeon: Melrose Nakayama, MD;  Location: [facility];  Service: Thoracic;  Laterality: Right;  . Bottom lobe of right lung removed  12/17/14    SOCIAL HISTORY: Social History   Social History  . Marital Status: Divorced    Spouse Name: N/A  . Number of Children: 3  . Years of Education: N/A   Occupational History  . retired    Social History Main Topics  . Smoking status: Former Smoker -- 2.00 packs/day for 32 years    Types: Cigarettes    Quit date: 07/03/1986  . Smokeless tobacco: Never Used  . Alcohol Use: No  . Drug Use: No  . Sexual Activity: Yes   Other Topics Concern  . Not on file   Social History Narrative   [employer].  Lives alone.  3 children, 1 passed from prostate cancer at age 1 16 grandchildren Ex-smoker. Started at 49, stopped in 1988.  2.5 ppd  FAMILY HISTORY: Family History  Problem Relation Age of Onset  . Colon cancer Neg Hx   . Cancer Mother     unknown type  . COPD Sister   . Breast cancer Sister   . Cancer Sister 62    breast  . Prostate cancer Son     died age 33   indicated that his mother is deceased. He indicated that his father is deceased. He indicated that both of his sisters are deceased. He indicated that his brother is alive.  Mother deceased, 26, possibly cancer Father deceased, 26, car wreck 2 sisters both deceased; youngest at 80, breast cancer and oldest at 77, COPD 1 brother living  ALLERGIES:  has No Known Allergies.  MEDICATIONS:  Current Outpatient Prescriptions  Medication Sig Dispense Refill  . acetaminophen (TYLENOL)  500 MG tablet Take 1 tablet (500 mg total) by mouth every 6 (six) hours as needed. 30 tablet 0  . Ascorbic Acid (VITAMIN C PO) Take 1 tablet by mouth daily.    . chlorthalidone (HYGROTON) 25 MG tablet Take 1 tablet (25 mg total) by mouth daily. 90 tablet 3  . Cholecalciferol 1000 UNITS TBDP Take 1,000 Int'l Units by mouth daily.    . cloNIDine (CATAPRES) 0.1 MG tablet Take  1 tablet (0.1 mg total) by mouth 2 (two) times daily. 180 tablet 0  . Dulaglutide (TRULICITY) 1.61 WR/6.0AV SOPN Inject 0.75 mg into the skin once a week. 12 pen 1  . glipiZIDE (GLUCOTROL) 10 MG tablet TAKE 1 TABLET (10 MG TOTAL) BY MOUTH DAILY. 90 tablet 0  . levothyroxine (SYNTHROID, LEVOTHROID) 50 MCG tablet TAKE 1 TABLET EVERY DAY 90 tablet 3  . lisinopril (PRINIVIL,ZESTRIL) 40 MG tablet Take 1 tablet (40 mg total) by mouth daily. 90 tablet 3  . metFORMIN (GLUCOPHAGE) 1000 MG tablet Take 1,000 mg by mouth 2 (two) times daily.     . metoprolol succinate (TOPROL-XL) 100 MG 24 hr tablet Take 100 mg by mouth daily.     [name] omeprazole (PRILOSEC) 20 MG capsule Take 1 capsule (20 mg total) by mouth daily. 30 capsule 5  . tiotropium (SPIRIVA) 18 MCG inhalation capsule Place 18 mcg into inhaler and inhale daily.    . valACYclovir (VALTREX) 1000 MG tablet Take 2 tablets (2,000 mg total) by mouth 2 (two) times daily. For 1 day for cold sores 12 tablet 2  . warfarin (COUMADIN) 5 MG tablet TAKE 1 TABLET EVERY DAY 90 tablet 0   No current facility-administered medications for this visit.    Review of Systems  Constitutional: Positive for weight loss. Weight loss of about 20 lbs this year. Weight loss secondary to appetite loss and exercise. HENT: Negative.   Eyes: Negative.   Respiratory: Positive for shortness of breath.     SOB in the last month Cardiovascular: Positive for chest pain. Negative for palpitations, orthopnea, claudication, leg swelling and PND.       During self-exertion  Gastrointestinal: Negative.     Genitourinary: Negative.   Musculoskeletal: Positive for joint pain.  Skin: Negative.   Neurological: Denies headaches.   Endo/Heme/Allergies: Negative.   Psychiatric/Behavioral: Negative.   All other systems reviewed and are negative.  14 point ROS was done and is otherwise as detailed above or in HPI   PHYSICAL EXAMINATION: ECOG PERFORMANCE STATUS: 0 - Asymptomatic   Vitals with BMI 01/11/2016  Height '5\' 11"'$   Weight 192 lbs  BMI 40.9  Systolic 811  Diastolic 914  Pulse 94  Respirations 16    Physical Exam  Constitutional: He is oriented to person, place, and time and well-developed, well-nourished, and in no distress.  HENT:  Head: Normocephalic and atraumatic.  Right Ear: External ear normal.  Left Ear: External ear normal.  Nose: Nose normal.  Mouth/Throat: Oropharynx is clear and moist. No oropharyngeal exudate.  Eyes: Conjunctivae and EOM are normal. Pupils are equal, round, and reactive to light. Right eye exhibits no discharge. Left eye exhibits no discharge. No scleral icterus.  Neck: Normal range of motion. Neck supple. No tracheal deviation present. No thyromegaly present.  Cardiovascular: Normal rate, regular rhythm and normal heart sounds.  Exam reveals no gallop and no friction rub.   No murmur heard. Pulmonary/Chest: Effort normal and breath sounds normal. He has no wheezes. He has no rales.  Well healed surgical incision sites  Abdominal: Soft. Bowel sounds are normal. He exhibits no distension and no mass. There is no tenderness. There is no rebound and no guarding.  Diastasis. Palpated umbilical hernia. Musculoskeletal: Normal range of motion. He exhibits no edema.  Lymphadenopathy:    He has no cervical adenopathy.  Neurological: He is alert and oriented to person, place, and time. He has normal reflexes. No cranial nerve deficit. Gait normal. Coordination normal.  Skin:  Skin is warm and dry. No rash noted.  Psychiatric: Mood, memory, affect and  judgment normal.  Nursing note and vitals reviewed.   PATHOLOGY: FINAL DIAGNOSIS Diagnosis 1. Pleura, biopsy, right - FIBROSIS AND DYSTROPHIC CALCIFICATIONS. - THERE IS NO EVIDENCE OF MALIGNANCY. 2. Lymph node, biopsy, 8 - THERE IS NO EVIDENCE OF CARCINOMA IN 1 OF 1 LYMPH NODE (0/1). 3. Lung, wedge biopsy/resection, right lower lobe superior segment - INVASIVE SQUAMOUS CELL CARCINOMA, POORLY DIFFERENTIATED, SPANNING 2.8 CM. - CARCINOMA IS PRESENT AT THE SURGICAL RESECTION MARGIN OF SPECIMEN #3. - SEE ONCOLOGY TABLE BELOW. 4. Lung, resection (segmental or lobe), right lower lobe - BENIGN LUNG PARENCHYMA WITH HEMORRHAGE AND SCATTERED FOCI OF ANTHRACOSIS. - THERE IS NO EVIDENCE OF CARCINOMA IN ONE OF LYMPH NODE (0/1). - THERE IS NO EVIDENCE OF MALIGNANCY. 5. Pleura, biopsy, right #2 - FIBROSIS AND DYSTROPHIC CALCIFICATIONS. - THERE IS NO EVIDENCE OF MALIGNANCY. 6. Lymph node, biopsy, 12R - THERE IS NO EVIDENCE OF CARCINOMA IN 1 OF 1 LYMPH NODE (0/1) 7. Lymph node, biopsy, 12R #2 - THERE IS NO EVIDENCE OF CARCINOMA IN 1 OF 1 LYMPH NODE (0/1) 8. Lymph node, biopsy, 11R - THERE IS NO EVIDENCE OF CARCINOMA IN 1 OF 1 LYMPH NODE (0/1) 9. Lymph node, biopsy, 12R #3 - THERE IS NO EVIDENCE OF CARCINOMA IN 1 OF 1 LYMPH NODE (0/1) 10. Lymph node, biopsy, 12R #4 - THERE IS NO EVIDENCE OF CARCINOMA IN 1 OF 1 LYMPH NODE (0/1) 1 of 4 Amended copy Amended FINAL for HANDY, MCLOUD ([id number]) Diagnosis(continued) 11. Lymph node, biopsy, 12R #5 - THERE IS NO EVIDENCE OF CARCINOMA IN 1 OF 1 LYMPH NODE (0/1) 12. Lymph node, biopsy, 4R - THERE IS NO EVIDENCE OF CARCINOMA IN 1 OF 1 LYMPH NODE (0/1) 13. Lymph node, biopsy, 10R - THERE IS NO EVIDENCE OF CARCINOMA IN 1 OF 1 LYMPH NODE (0/1) 14. Lymph node, biopsy, 4R #2 - THERE IS NO EVIDENCE OF CARCINOMA IN 1 OF 1 LYMPH NODE (0/1) 15. Lymph node, biopsy, 4R #3 - THERE IS NO EVIDENCE OF CARCINOMA IN 1 OF 1 LYMPH NODE (0/1) Microscopic  Comment 3. LUNG Specimen, including laterality: Right lower lobe Procedure: Complete lobectomy Specimen integrity (intact/disrupted): Intact (with staples) Tumor site: Right lower lobe, subpleural Tumor focality: Unifocal Maximum tumor size (cm): 2.8 cm (gross measurement) Histologic type: Squamous cell carcinoma Grade: High grade (poorly differentiated) Margins: Negative for carcinoma Distance to closest margin (cm): At least 4.0 cm to margins (gross measurement) Visceral pleura invasion: Not identified Tumor extension: Confined to lung parenchyma Treatment effect (if treated with neoadjuvant therapy): N/A Lymph -Vascular invasion: Not identified Lymph nodes: Number examined - 12; Number N1 nodes positive 0; Number N2 nodes positive 0 TNM code: pT1b, pN0 Ancillary studies: Can be performed upon clinician request Non-neoplastic lung: Foci of acute inflammation with necrosis, anthracosis Enid Cutter MD Pathologist, Electronic Signature (Case signed 12/21/2014) Corrected Report Signer Enid Cutter MD Pathologist, Electronic Signature (Case signed 12/24/2014) 2 of 4 Amended copy Amended FINAL for RAAHIM, SHARTZER ([id number]) Intraoperative Diagnosis 1. RIGHT LUNG PLEURAL BIOPSY, FROZEN SECTION: FIBROSIS AND CALCIFICATIONS. (JBK) 2. LYMPH NODE, LEVEL 8, FROZEN SECTION: ANTHRACOSIS. (JBK) 3. LUNG, RIGHT LOWER LOBE SUPERIOR SEGMENT, MARGIN, FROZEN SECTION: MALIGNANT (JBK) 4. LUNG, RIGHT LOBE RESECTION, BRONCHIAL MARGIN, FROZEN SECTION: NEGATIVE FOR MALIGNANCY. (JBK) Specimen Gross and Clinical Information Specimen(s) Obtained: 1. Pleura, biopsy, right 2. Lymph node, biopsy, 8 3. Lung, wedge biopsy/resection, right lower lobe superior segment 4. Lung, resection (segmental  or lobe), right lower lobe 5. Pleura, biopsy, right #2 6. Lymph node, biopsy, 12R 7. Lymph node, biopsy, 12R #2 8. Lymph node, biopsy, 11R 9. Lymph node, biopsy, 12R #3 10. Lymph node, biopsy, 12R #4 11.  Lymph node, biopsy, 12R #5 12. Lymph node, biopsy, 4R 13. Lymph node, biopsy, 10R 14. Lymph node, biopsy, 4R #2 15. Lymph node, biopsy, 4R #3 Specimen Clinical Information 1. right lower lobe lung mass (cm) Gross 1. Received fresh is a 2.4 cm in diameter and up to 0.2 cm thick gray white to vaguely yellow fibrous-like tissue with scattered nodularity and gritty cut surfaces. Representative sections are submitted in block A for frozen section, with remaining specimen submitted in block B for routine histology. 2. Received fresh is a 1.1 x 1 x 0.3 cm aggregate of tan to anthracotic soft tissue, submitted in one block for frozen section. 3. Received fresh is a 48 gram, 9 x 5 x 4 cm wedge of lung, clinically right lower lobe superior segment. The pleura is tan pink to red purple with scattered anthracosis, and few scattered adhesions. There are are three stapled areas, one of which has a suture and is clinically identified as closest margin. Found just beneath this stapled margin, and grossly involved with the staple line is a 2.8 x 2.6 x 2.3 cm tan white to anthracotic firm ill defined mass. Separate from the mass is a second area of tan red vaguely nodular and indurated parenchyma, 1.4 x 1.1 x 0.9 cm. Tissue at the staple line clinically identified as closest margin is submitted for frozen section in block A. Additional sections for routine histology are submitted as follows: B= mass. C= mass with overlying pleura. D= mass and and adjacent parenchyma. E= sections of nodular area of parenchyma separate from mass. Total 5 blocks. 4. Specimen: Right lower lobectomy. Specimen integrity (intact/incised/disrupted): There are staple lines over the superior segment. Size, weight: 198 grams, 11 x 9.6 x 4 cm. Pleura: Tan pink to hyperemic with moderate scattered anthracosis. Lesion: No residual lesion or mass is identified. Margin(s): Grossly free of lesions. 3 of 4 Amended copy Amended FINAL  for COSIMO, SCHERTZER ([id number]) Gross(continued) Hilar vessels: Unremarkable. Nonneoplastic parenchyma: Tan red to dark red, spongy. Lymph nodes, level 12: At the hilum is a 0.8 cm soft anthracotic lymph node. Block Summary: A shave of the bronchial margin is submitted in block A for frozen section. Additional sections for routine histology are submitted as follows: B= vascular margins. C-E= tissue adjacent to stapled areas over superior segment. F= random parenchyma. G= one node, bisected. Total 7 blocks. 5. Received fresh are three irregular pieces of tan pink to yellow, soft to firm fibromembranous and fibrous-like tissues ranging from 1 x 0.6 x 0.2 cm to 3.1 x 2.5 x 0.5 cm. The firm areas have nodular tissues with gritty cut surfaces. Representative sections in one block. 6. Received fresh labeled 12R node, is a 1 cm soft to rubbery anthracotic node, bisected, one block. 7. Received fresh labeled 12R node #2, is a 0.6 cm soft anthracotic node, bisected, one block. 8. Received fresh labeled 11R node, is a 1.7 x 1 x 0.3 cm aggregate of anthracotic, soft to rubbery tissue, submitted in one block. 9. Received fresh labeled 12R node #3 is a 0.9 cm soft rubbery, anthracotic node which is sectioned and submitted in one block. 10. Received in fresh labeled 12R node #4 is a 1 x 0.8 x 0.3 cm aggregate of anthracotic soft to  rubbery tissue, submitted in one block. 11. Received fresh labeled 12R node #5, is a 0.6 x 0.4 x 0.2 cm aggregate of anthracotic soft tissue, submitted in one block. 12. Received fresh labeled labeled 4R node, is a 0.9 cm gray white to anthracotic soft to rubbery node, bisected, one block. 13. Received fresh labeled 10R node, is a 1 x 0.9 x 0.3 cm tan yellow to anthracotic soft tissue in toto, one block. 14. Received fresh labeled 4R node# 2 is a 1.6 x 1.4 x 0.3 cm aggregate of tan yellow to anthracotic soft rubbery tissue, entirely submitted in one block. 15. Received  fresh labeled 4R node #3 is a 1 cm soft rubbery, anthracotic node, bisected, one block. (SW:gt, 12/21/14) Report signed out from the following location(s) Technical Component was performed at [facility]. [address],[place] [address].[id number]., Interpretation was performed at [facility], [place], [place] [address]. CLIA #: [id number], 4 of  LABORATORY DATA:  I have reviewed the data as listed Lab Results  Component Value Date   WBC 6.5 12/28/2015   HGB 10.9* 12/28/2015   HCT 32.9* 12/28/2015   MCV 88.2 12/28/2015   PLT 286 12/28/2015   CMP     Component Value Date/Time   NA 139 12/28/2015 1319   NA 143 05/13/2015 1133   K 4.1 12/28/2015 1319   CL 107 12/28/2015 1319   CO2 29 12/28/2015 1319   GLUCOSE 254* 12/28/2015 1319   GLUCOSE 182* 05/13/2015 1133   BUN 13 12/28/2015 1319   BUN 12 05/13/2015 1133   CREATININE 0.95 12/28/2015 1319   CALCIUM 9.0 12/28/2015 1319   PROT 6.5 05/13/2015 1133   PROT 6.7 04/21/2015 1313   ALBUMIN 4.1 05/13/2015 1133   ALBUMIN 3.6 04/21/2015 1313   AST 17 05/13/2015 1133   ALT 14 05/13/2015 1133   ALKPHOS 62 05/13/2015 1133   BILITOT 0.2 05/13/2015 1133   BILITOT 0.4 04/21/2015 1313   GFRNONAA >60 12/28/2015 1319   GFRAA >60 12/28/2015 1319     RADIOGRAPHIC STUDIES: I have personally reviewed the radiological images as listed and agreed with the findings in the report.  Study Result     CLINICAL DATA: Restaging squamous cell lung carcinoma.  EXAM: CT CHEST WITH CONTRAST  TECHNIQUE: Multidetector CT imaging of the chest was performed during intravenous contrast administration.  CONTRAST: 93m ISOVUE-300 IOPAMIDOL (ISOVUE-300) INJECTION 61%  COMPARISON: 03/09/2015 chest radiograph. 10/25/2010 chest CT.  FINDINGS: Mediastinum/Nodes: Normal heart size. No significant pericardial fluid/thickening. Left main, left anterior descending,  left circumflex and right coronary atherosclerosis. Atherosclerotic nonaneurysmal thoracic aorta. Normal caliber pulmonary arteries. No central pulmonary emboli. No discrete thyroid nodules. Unremarkable esophagus. No pathologically enlarged axillary, mediastinal or hilar lymph nodes.  Lungs/Pleura: No pneumothorax. There are small calcified pleural plaques throughout the right greater than left pleural spaces. Trace right pleural effusion. No left pleural effusion. Right lower lobectomy. Minimal centrilobular and paraseptal emphysema with mild diffuse bronchial wall thickening. No acute consolidative airspace disease. Apical left upper lobe 2 mm solid pulmonary nodule (series 3/ image 20) is stable since 10/25/2010 and benign. Subpleural left lower lobe 3 mm pulmonary nodule (series 3/ image 101), not definitely seen on 10/25/2010 chest CT.  Upper abdomen: Simple 4.3 cm upper right renal cyst. Simple 1.3 cm interpolar right renal cyst. Right adrenal 1.6 cm nodule with indeterminate density 43 HU, stable in size using similar measurement technique since 10/25/2010, consistent with a benign right adrenal adenoma.  Musculoskeletal: No aggressive appearing focal osseous lesions. Moderate thoracic spine spondylosis. Partially visualized left shoulder hemiarthroplasty.  IMPRESSION: 1. No evidence of local tumor recurrence in the right lung. No definite findings of metastatic disease in the chest. 2. Solitary subpleural 3 mm left lower lobe pulmonary nodule is probably benign, however was not definitely seen on 10/25/2010 chest CT. Follow-up chest CT is advised in 6 months. 3. Calcified pleural plaques bilaterally with trace right pleural effusion, consistent with asbestos related pleural disease. 4. Left main and 3 vessel coronary atherosclerosis. 5. Minimal emphysema and mild diffuse bronchial wall thickening, which could indicate COPD. 6. Stable right adrenal  adenoma.   Electronically Signed  By: Ilona Sorrel M.D.  On: 12/21/2015 14:09     ASSESSMENT & PLAN:  T1bN0M0 squamous cell carcinoma of the RLL Prior history of tobacco abuse Pleural plaques suggestive of prior asbestos exposure Hypertension Anemia Chronic Anticoagulation  We reviewed his chest CT in detail. Sub pleural 31m LLL pulm nodule is noted. He will be due for repeat imaging in 6 months in accordance to guidelines. He is quite concerned about the new nodule, I have therefore ordered scans for 3 months with follow-up post.   Moving forward I reviewed surveillance guidelines:  NCCN guidelines for Non-Small Cell Lung Cancer Surveillance are as follows:  A. H+P every 6-12 months and CT of chest every 6 months for the first two years  B. Low-dose spiral CT chest annually after first two years of surveillance CTs   C. PET scan not typically indicated for surveillance  D. Smoking cessation advice, counseling, and pharmacotherapy   I advised him we will recommend an annual influenza vaccine, staying updated with his pneumococcal vaccination.  Patient was asymptomatic from his BP, repeat BP by myself had a systolic of 1993 I did encourage him to continue to work with his PCP in regards of his HTN.   Counts will be rechecked at 3 month follow-up if not improved or worsened we will pursue anemia evaluation.   Orders Placed This Encounter  Procedures  . CT Chest W Contrast    Standing Status:   Future    Standing Expiration Date:   01/11/2017    Order Specific Question:   If indicated for the ordered procedure, I authorize the administration of contrast media per Radiology protocol    Answer:   Yes    Order Specific Question:   Reason for Exam (SYMPTOM  OR DIAGNOSIS REQUIRED)    Answer:   follow-up lung cancer, new nodule    Order Specific Question:   [facility]    [facility]   All questions were answered. The patient knows to call  the clinic with any problems, questions or concerns.   This document serves as a record of services personally performed by SAncil Linsey MD. It was created on her behalf by EArlyce Harman a trained medical scribe. The creation of this record is based on the scribe's personal observations and the provider's statements to them. This document has been checked and approved by the attending provider.  I have reviewed the above documentation for accuracy and completeness, and I agree with the above.  This note was electronically signed.   SKelby Fam PWhitney Muse MD

## 2016-01-13 ENCOUNTER — Ambulatory Visit (INDEPENDENT_AMBULATORY_CARE_PROVIDER_SITE_OTHER): Payer: Commercial Managed Care - HMO | Admitting: Family Medicine

## 2016-01-13 ENCOUNTER — Encounter: Payer: Self-pay | Admitting: Family Medicine

## 2016-01-13 VITALS — BP 196/91 | HR 89 | Temp 97.3°F | Ht 71.0 in | Wt 197.8 lb

## 2016-01-13 DIAGNOSIS — I4892 Unspecified atrial flutter: Secondary | ICD-10-CM

## 2016-01-13 DIAGNOSIS — I10 Essential (primary) hypertension: Secondary | ICD-10-CM | POA: Diagnosis not present

## 2016-01-13 DIAGNOSIS — H9190 Unspecified hearing loss, unspecified ear: Secondary | ICD-10-CM | POA: Insufficient documentation

## 2016-01-13 DIAGNOSIS — C3490 Malignant neoplasm of unspecified part of unspecified bronchus or lung: Secondary | ICD-10-CM

## 2016-01-13 DIAGNOSIS — Z85118 Personal history of other malignant neoplasm of bronchus and lung: Secondary | ICD-10-CM | POA: Insufficient documentation

## 2016-01-13 DIAGNOSIS — G250 Essential tremor: Secondary | ICD-10-CM | POA: Insufficient documentation

## 2016-01-13 DIAGNOSIS — IMO0001 Reserved for inherently not codable concepts without codable children: Secondary | ICD-10-CM | POA: Insufficient documentation

## 2016-01-13 DIAGNOSIS — M1990 Unspecified osteoarthritis, unspecified site: Secondary | ICD-10-CM | POA: Insufficient documentation

## 2016-01-13 DIAGNOSIS — E118 Type 2 diabetes mellitus with unspecified complications: Secondary | ICD-10-CM | POA: Diagnosis not present

## 2016-01-13 DIAGNOSIS — G4730 Sleep apnea, unspecified: Secondary | ICD-10-CM | POA: Insufficient documentation

## 2016-01-13 DIAGNOSIS — Z7901 Long term (current) use of anticoagulants: Secondary | ICD-10-CM | POA: Diagnosis not present

## 2016-01-13 LAB — COAGUCHEK XS/INR WAIVED
INR: 4.3 — ABNORMAL HIGH (ref 0.9–1.1)
Prothrombin Time: 51.3 s

## 2016-01-13 LAB — BAYER DCA HB A1C WAIVED: HB A1C (BAYER DCA - WAIVED): 7.8 % — ABNORMAL HIGH (ref <7.0)

## 2016-01-13 MED ORDER — CLONIDINE HCL 0.1 MG PO TABS: 0.1000 mg | ORAL_TABLET | Freq: Three times a day (TID) | ORAL | Status: DC

## 2016-01-13 MED ORDER — AMLODIPINE BESYLATE 10 MG PO TABS: 10.0000 mg | ORAL_TABLET | Freq: Every day | ORAL | Status: DC

## 2016-01-13 NOTE — Patient Instructions (Signed)
Great to see you!  Warfarin: Dont take warfarin for 2 days Change to 1/2 pill on mondays and fridays, 1 whole pill all other days  Blood pressure: Start amlodipine, 1 pill once a day Change clonidine to 1 pill three times daily  If you have low blood pressures (dizziness or BP lower than 120) stop clonidine and let me know  Come back ion 2 week sto see me for INR chack and blood pressure check

## 2016-01-13 NOTE — Progress Notes (Signed)
   HPI  Patient presents today here for follow-up of chronic medical conditions.  Diabetes Blood sugar at home is usually 150-200 fasting, has had 2 episodes in the last month over 200, he should 20 units of Lantus at that time. No hypoglycemia Doing well with metformin and trulicity Exercising regularly.  Hypertension Denies headache, chest pain, palpitations, leg edema Has mild stable dyspnea. Taking clonidine twice daily, otherwise taking other medications as prescribed. Was recommended to go to the emergency room by thoracic surgery a few days ago due to systolic blood pressure in the 230s, however he refused.  Chronic anticoagulation due to atrial flutter Taking 5 mg daily of Coumadin. No signs of bleeding or easy bruising.  PMH: Smoking status noted ROS: Per HPI  Objective: BP 196/91 mmHg  Pulse 89  Temp(Src) 97.3 F (36.3 C) (Oral)  Ht '5\' 11"'$  (1.803 m)  Wt 197 lb 12.8 oz (89.721 kg)  BMI 27.60 kg/m2 Gen: NAD, alert, cooperative with exam HEENT: NCAT CV: RRR, good S1/S2, no murmur Resp: CTABL, no wheezes, non-labored Ext: No edema, warm Neuro: Alert and oriented, No gross deficits  Assessment and plan:  # Hypertension Very elevated today Continue chlorthalidone, lisinopril, and metoprolol Adding amlodipine Change clonidine to 3 times a day dosing Follow-up 2 weeks for hypertension and INR   # Type 2 diabetes Control improve slightly with A1c of 7.8 Continue current medications If he becomes uncontrolled again consider daily Lantus dosing rather than when necessary, however at this time he is reasonably controlled so make no changes  # Chronic anticoagulation, due to atrial flutter  INR supratherapeutic today at 4.3 No signs of bleeding Recommended holding dose 2 days, reduced dose to one half pill, 2.5 mg on Mondays and Fridays and 1 whole pill, 5 mg on other days, reducing total weekly dose from 35 mg to 30 mg. Re-check in 2 weeks   Orders Placed  This Encounter  Procedures  . Bayer DCA Hb A1c Waived    Meds ordered this encounter  Medications  . amLODipine (NORVASC) 10 MG tablet    Sig: Take 1 tablet (10 mg total) by mouth daily.    Dispense:  90 tablet    Refill:  3  . cloNIDine (CATAPRES) 0.1 MG tablet    Sig: Take 1 tablet (0.1 mg total) by mouth 3 (three) times daily.    Dispense:  270 tablet    Refill:  0    [name], MD [facility] 01/13/2016, 9:13 AM

## 2016-01-13 NOTE — Addendum Note (Signed)
Addended by: Pollyann Kennedy F on: 01/13/2016 10:08 AM   Modules accepted: Orders

## 2016-01-17 ENCOUNTER — Other Ambulatory Visit: Payer: Self-pay | Admitting: Family Medicine

## 2016-01-18 ENCOUNTER — Encounter: Payer: Self-pay | Admitting: Family Medicine

## 2016-01-18 MED ORDER — METFORMIN HCL 1000 MG PO TABS: 1000.0000 mg | ORAL_TABLET | Freq: Two times a day (BID) | ORAL | Status: DC

## 2016-01-18 NOTE — Telephone Encounter (Signed)
ambien not on med list? It will have to be printed and pt will send. He is aware, print and put upront

## 2016-01-25 ENCOUNTER — Telehealth: Payer: Self-pay | Admitting: Family Medicine

## 2016-01-28 ENCOUNTER — Encounter: Payer: Self-pay | Admitting: Family Medicine

## 2016-01-28 ENCOUNTER — Ambulatory Visit (INDEPENDENT_AMBULATORY_CARE_PROVIDER_SITE_OTHER): Payer: Commercial Managed Care - HMO | Admitting: Family Medicine

## 2016-01-28 VITALS — BP 153/62 | HR 75 | Temp 96.9°F | Ht 71.0 in | Wt 195.6 lb

## 2016-01-28 DIAGNOSIS — I10 Essential (primary) hypertension: Secondary | ICD-10-CM | POA: Diagnosis not present

## 2016-01-28 DIAGNOSIS — Z7901 Long term (current) use of anticoagulants: Secondary | ICD-10-CM | POA: Diagnosis not present

## 2016-01-28 DIAGNOSIS — I4892 Unspecified atrial flutter: Secondary | ICD-10-CM

## 2016-01-28 LAB — COAGUCHEK XS/INR WAIVED
INR: 2.5 — ABNORMAL HIGH (ref 0.9–1.1)
Prothrombin Time: 30.3 s

## 2016-01-28 NOTE — Progress Notes (Signed)
   HPI  Patient presents today here to follow-up for hypertension and INR check.  Patient has changed all of his medications except for the Coumadin dosing.  He denies any worries or concerns for bleeding. No palpitations.  Hypertension He has good medication compliance, he's changed clonidine to 3 times daily. He denies any low blood pressure, however he has had generalized fatigue and not feeling well since changing her medications. Denies any chest pain, dyspnea, palpitations, leg edema.    PMH: Smoking status noted ROS: Per HPI  Objective: BP (!) 153/62   Pulse 75   Temp (!) 96.9 F (36.1 C) (Oral)   Ht '5\' 11"'$  (1.803 m)   Wt 195 lb 9.6 oz (88.7 kg)   BMI 27.28 kg/m  Gen: NAD, alert, cooperative with exam HEENT: NCAT CV: RRR, good S1/S2, no murmur Resp: CTABL, no wheezes, non-labored Ext: No edema, warm Neuro: Alert and oriented, No gross deficits  Assessment and plan:  # Hypertension Very difficult to control, last visit his systolic was 924 Elevated today, however given age and feelings of fatigue currently I will not push harder. Continue clonidine, chlorthalidone, lisinopril, amlodipine, and metoprolol.  # Atrial flutter Regular rate and rhythm today, INR is therapeutic He did not change his previous Coumadin dose due to confusion about the directions, so we will continue his original instructions which is 5 mg every day.   # Chronic anticoagulation Indications atrial flutter INR is therapeutic Follow-up one month   [name], MD [facility] 01/28/2016, 10:03 AM

## 2016-01-28 NOTE — Patient Instructions (Addendum)
Great to see you!  Take warfarin like usual 1 pill once a day  Don't change any blood pressure meds for now

## 2016-01-31 NOTE — Progress Notes (Signed)
Cardiology Office Note   Date:  02/02/2016   ID:  Timothy, Guerrero [birth year], MRN [id number]  PCP:  Timothy Guerrero  Cardiologist:   Timothy Guerrero   Chief Complaint  Patient presents with  . Hypertension      History of Present Illness: Timothy Guerrero is a 75 y.o. male who presents for follow-up of atrial flutter. He was doing this paroxysmally in the hospital when he was admitted for lobectomy for non-small cell lung cancer. We started him on anticoagulation. Atrial Fibrillation Clinic did not think ablation was indicated. In the past he's had nonobstructive disease and small vessel disease with high-grade stenosis and a nondominant right coronary artery on cath in 2013 in the [place]. He had a stress test in 2015 which was negative. He's been managed medically. He's had a well preserved ejection fraction.  At the last visit I switched from hydrochlorothiazide to chlorthalidone.  Since I last saw him in Feb recently he saw Dr. Roxan Guerrero.  His BP was 235/116.  He refused any ER visit. He since been back to see Dr. Wendi Guerrero and had clonidine restarted. This has been stopped because of hypotension. He also started on Norvasc. His blood pressures are better controlled today and he says at home they've been well controlled with systolics in the 765Y.  The patient denies any new symptoms such as chest discomfort, neck or arm discomfort. There has been no new shortness of breath, PND or orthopnea. There have been no reported palpitations, presyncope or syncope.  He isn't sleeping well. Is tired during the day. Of note he does say that he's only taking his clonidine twice daily not 3 times daily.   Past Medical History:  Diagnosis Date  . Arthritis   . Atrial flutter ([place])    post op following lobectomy  . Benign essential tremor   . Cataract   . Colon polyp    2007, polyp with early cancer, one year f/u no residual polyp. next TCS 2010, see [facility]. [facility].  .  Diabetes mellitus without complication (Timothy Guerrero)    10 years  . HOH (hard of hearing)   . Hypercholesteremia   . Hypertension    10 years  . Lung cancer (Timothy Guerrero)   . Prostate cancer (Timothy Guerrero)   . Shortness of breath dyspnea    due to lung mass  . Skin melanoma (Timothy Guerrero)   . Sleep apnea    wears CPAP sometimes, cannot tolerate all the time.    Past Surgical History:  Procedure Laterality Date  . bottom lobe of right lung removed  12/17/14  . CARDIAC CATHETERIZATION     no PCI  . CATARACT EXTRACTION Left   . CATARACT EXTRACTION W/PHACO Left 01/03/2016   Procedure: CATARACT EXTRACTION PHACO AND INTRAOCULAR LENS PLACEMENT LEFT EYE CDE=7.78;  Surgeon: Timothy Guerrero;  Location: [facility];  Service: Ophthalmology;  Laterality: Left;  . COLONOSCOPY  08/2008   Dr. Leafy Half, [facility] [place]: normal, internal grade 1 hemorrhoids. next TCS 08/2013  . COLONOSCOPY N/A 03/04/2014   Procedure: COLONOSCOPY;  Surgeon: Timothy Guerrero;  Location: [facility];  Service: Endoscopy;  Laterality: N/A;  7:30am  . EYE SURGERY    . knee replacements     bilateral. over 10 years ago  . LOBECTOMY Right 12/17/2014   Procedure: RIGHT LOWER LOBECTOMY;  Surgeon: Timothy Guerrero;  Location: [facility];  Service: Thoracic;  Laterality:  Right;  Timothy Guerrero LYMPH NODE DISSECTION Right 12/17/2014   Procedure: LYMPH NODE DISSECTION;  Surgeon: Timothy Guerrero;  Location: [facility];  Service: Thoracic;  Laterality: Right;  . NECK SURGERY     melanoma removed.  Timothy Guerrero PROSTATE SURGERY     prostatectomy, radical  . TOTAL HIP ARTHROPLASTY  2007   left  . total shoulder replacement  2011   left  . VIDEO ASSISTED THORACOSCOPY (VATS)/WEDGE RESECTION Right 12/17/2014   Procedure: RIGHT VIDEO ASSISTED THORACOSCOPY (VATS);  Surgeon: Timothy Guerrero;  Location: [facility];  Service: Thoracic;  Laterality: Right;     Current Outpatient Prescriptions  Medication Sig Dispense Refill  . acetaminophen (TYLENOL) 500 MG  tablet Take 1 tablet (500 mg total) by mouth every 6 (six) hours as needed. 30 tablet 0  . amLODipine (NORVASC) 10 MG tablet Take 1 tablet (10 mg total) by mouth daily. 90 tablet 3  . Ascorbic Acid (VITAMIN C PO) Take 1 tablet by mouth daily.    . chlorthalidone (HYGROTON) 25 MG tablet Take 1 tablet (25 mg total) by mouth daily. 90 tablet 3  . Cholecalciferol 1000 UNITS TBDP Take 1,000 Int'l Units by mouth daily.    . cloNIDine (CATAPRES) 0.1 MG tablet Take 1 tablet (0.1 mg total) by mouth 2 (two) times daily. 270 tablet 0  . glipiZIDE (GLUCOTROL) 10 MG tablet TAKE 1 TABLET (10 MG TOTAL) BY MOUTH DAILY. 90 tablet 0  . levothyroxine (SYNTHROID, LEVOTHROID) 50 MCG tablet TAKE 1 TABLET EVERY DAY 90 tablet 3  . lisinopril (PRINIVIL,ZESTRIL) 40 MG tablet Take 1 tablet (40 mg total) by mouth daily. 90 tablet 3  . metFORMIN (GLUCOPHAGE) 1000 MG tablet Take 1 tablet (1,000 mg total) by mouth 2 (two) times daily. 180 tablet 1  . metoprolol succinate (TOPROL-XL) 100 MG 24 hr tablet Take 100 mg by mouth daily.     Timothy Guerrero omeprazole (PRILOSEC) 20 MG capsule Take 1 capsule (20 mg total) by mouth daily. 30 capsule 5  . tiotropium (SPIRIVA) 18 MCG inhalation capsule Place 18 mcg into inhaler and inhale daily.    . valACYclovir (VALTREX) 1000 MG tablet Take 2 tablets (2,000 mg total) by mouth 2 (two) times daily. For 1 day for cold sores 12 tablet 2  . warfarin (COUMADIN) 5 MG tablet TAKE 1 TABLET EVERY DAY 90 tablet 0  . Dulaglutide (TRULICITY) 1.75 ZW/2.5EN SOPN Inject 0.75 mg into the skin once a week. 24 pen 0   No current facility-administered medications for this visit.     Allergies:   Review of patient's allergies indicates no known allergies.    ROS:  Please see the history of present illness.   Otherwise, review of systems are positive for none.   All other systems are reviewed and negative.    PHYSICAL EXAM: VS:  BP (!) 145/71   Pulse 80   Ht 6' (1.829 m)   Wt 197 lb (89.4 kg)   BMI 26.72 kg/m   , BMI Body mass index is 26.72 kg/m. GENERAL:  Well appearing HEENT:  Pupils equal round and reactive, fundi not visualized, oral mucosa unremarkable NECK:  No jugular venous distention, waveform within normal limits, carotid upstroke brisk and symmetric, no bruits, no thyromegaly LYMPHATICS:  No cervical, inguinal adenopathy LUNGS:  Clear to auscultation bilaterally BACK:  No CVA tenderness CHEST: Well healed right thoracotomy scar HEART:  PMI not displaced or sustained,S1 and S2 within normal limits, no S3, no S4, no clicks, no rubs,  no murmurs ABD:  Flat, positive bowel sounds normal in frequency in pitch, no bruits, no rebound, no guarding, no midline pulsatile mass, no hepatomegaly, no splenomegaly EXT:  2 plus pulses throughout, no edema, no cyanosis no clubbing    EKG:  EKG is no ordered today.   Recent Labs: 02/17/2015: TSH 4.730 05/13/2015: ALT 14 12/28/2015: BUN 13; Creatinine, Ser 0.95; Hemoglobin 10.9; Platelets 286; Potassium 4.1; Sodium 139    Lipid Panel    Component Value Date/Time   CHOL 191 05/13/2015 1133   TRIG 199 (H) 05/13/2015 1133   HDL 35 (L) 05/13/2015 1133   CHOLHDL 5.5 (H) 05/13/2015 1133   LDLCALC 116 (H) 05/13/2015 1133      Wt Readings from Last 3 Encounters:  02/02/16 197 lb (89.4 kg)  01/28/16 195 lb 9.6 oz (88.7 kg)  01/13/16 197 lb 12.8 oz (89.7 kg)      Other studies Reviewed: Additional studies/ records that were reviewed today include: Office records  Review of the above records demonstrates:     ASSESSMENT AND PLAN:  ATRIAL FLUTTER:  I think it is very likely that he has paroxysmal flutter.  Mr. ROGER KETTLES has a CHA2DS2 - VASc score of 4 with a risk of stroke of 4%.  I think anticoagulation chronically is indicated. He understands and accepts this risk. He could not afford a DOAC and so he will remain on warfarin.   HTN:  His blood pressure is at target today.  He is tolerating the meds as listed. No change in therapy is  indicated.  FATIGUE:  This may be medications but his part probably related to not sleeping well.  He's going to discuss this further with Timothy Guerrero.  We discussed sleep hygiene. We discussed the use of melatonin. We'll discuss again with Dr. Wendi Guerrero whether Ambien would be appropriate. He apparently did well with this in the past. I will defer.  CAD:  As mentioned he has small vessel disease but no symptoms. I will continue to manage this medically.  Current medicines are reviewed at length with the patient today.  The patient does have concerns regarding medicines.  The following changes have been made:  As above  Labs/ tests ordered today include:  No orders of the defined types were placed in this encounter.    Disposition:   FU with me in 12 months.     Signed, Timothy Guerrero  02/02/2016 11:39 AM    [facility]

## 2016-01-31 NOTE — Telephone Encounter (Signed)
Patient seen on 7/28

## 2016-02-01 ENCOUNTER — Telehealth: Payer: Self-pay | Admitting: Family Medicine

## 2016-02-01 NOTE — Telephone Encounter (Signed)
Called patient to see what he needed to talk with Dr.Vanzile but patient would not tell me just said it was personal and only wanted to talk to him. Please advise

## 2016-02-01 NOTE — Telephone Encounter (Signed)
Please call this patient tomorrow to see if you can figure out what he needs to talk to me about or if he needs an appointment to talk to me.

## 2016-02-02 ENCOUNTER — Ambulatory Visit (INDEPENDENT_AMBULATORY_CARE_PROVIDER_SITE_OTHER): Payer: Commercial Managed Care - HMO | Admitting: Cardiology

## 2016-02-02 ENCOUNTER — Encounter: Payer: Self-pay | Admitting: Cardiology

## 2016-02-02 ENCOUNTER — Other Ambulatory Visit: Payer: Self-pay | Admitting: Pharmacist

## 2016-02-02 VITALS — BP 145/71 | HR 80 | Ht 72.0 in | Wt 197.0 lb

## 2016-02-02 DIAGNOSIS — I10 Essential (primary) hypertension: Secondary | ICD-10-CM

## 2016-02-02 DIAGNOSIS — I4892 Unspecified atrial flutter: Secondary | ICD-10-CM | POA: Diagnosis not present

## 2016-02-02 MED ORDER — DULAGLUTIDE 0.75 MG/0.5ML ~~LOC~~ SOAJ: 0.7500 mg | SUBCUTANEOUS | 0 refills | Status: DC

## 2016-02-02 NOTE — Patient Instructions (Signed)

## 2016-02-02 NOTE — Telephone Encounter (Signed)
L M to call back and ask for Reginia Forts

## 2016-02-09 ENCOUNTER — Telehealth: Payer: Self-pay | Admitting: Family Medicine

## 2016-02-09 NOTE — Telephone Encounter (Signed)
Patient states that his blood pressure was 122/63 and that he needed to get a new blood sugar meter. Patient states he would get it when he goes to the [place] doctor next week.

## 2016-02-09 NOTE — Telephone Encounter (Signed)
Patient states that his blood

## 2016-02-11 ENCOUNTER — Encounter (HOSPITAL_COMMUNITY): Payer: Self-pay | Admitting: Hematology & Oncology

## 2016-02-16 DIAGNOSIS — H538 Other visual disturbances: Secondary | ICD-10-CM | POA: Diagnosis not present

## 2016-02-16 DIAGNOSIS — H2511 Age-related nuclear cataract, right eye: Secondary | ICD-10-CM | POA: Diagnosis not present

## 2016-02-28 ENCOUNTER — Ambulatory Visit (INDEPENDENT_AMBULATORY_CARE_PROVIDER_SITE_OTHER): Payer: Commercial Managed Care - HMO | Admitting: Family Medicine

## 2016-02-28 ENCOUNTER — Encounter: Payer: Self-pay | Admitting: Family Medicine

## 2016-02-28 VITALS — BP 95/55 | HR 111 | Temp 97.6°F | Ht 71.0 in | Wt 190.6 lb

## 2016-02-28 DIAGNOSIS — I951 Orthostatic hypotension: Secondary | ICD-10-CM

## 2016-02-28 DIAGNOSIS — I4892 Unspecified atrial flutter: Secondary | ICD-10-CM

## 2016-02-28 DIAGNOSIS — I10 Essential (primary) hypertension: Secondary | ICD-10-CM

## 2016-02-28 NOTE — Progress Notes (Signed)
   HPI  Patient presents today for follow-up hypertension.  Patient explains that his blood pressure has been less than 100 several times at home, average is 924 to 462 systolic. He complains of fatigue and dizziness.  Over the weekend he had dizziness while playing golf. He states that every time he tried to stand up after placing the tee, he had severe dizziness and almost "passed out."  He denies any syncope.  He denies any chest pain, or leg swelling.  PMH: Smoking status noted ROS: Per HPI  Objective: BP (!) 95/55   Pulse (!) 111   Temp 97.6 F (36.4 C) (Oral)   Ht '5\' 11"'$  (1.803 m)   Wt 190 lb 9.6 oz (86.5 kg)   BMI 26.58 kg/m  Gen: NAD, alert, cooperative with exam HEENT: NCAT, EOMI, PERRL CV: Tachycardic, regular rhythm, no murmur Resp: CTABL, no wheezes, non-labored Ext: No edema, warm Neuro: Alert and oriented, No gross deficits  Assessment and plan:  # Hypertension controlled evidence of orthostatic hypotension today Discontinue clonidine Continue amlodipine, lisinopril, and metoprolol. Consider going up on BB, still HR 80s + most checks Return to clinic in 2 weeks for nurse blood pressure check, return to clinic for follow-up in 3 months.  # orthostatic  hypotension Discontinuing clonidine BP change from 117/62 lying,  to 117/71 sitting, to 95/55 standing Heart rate change 92, 102, 111 respectively Nurse BP check in 2 weeks  Atrial flutter- paroxsysmal Heart rate sounds to be in regular rhythm today, when checking orthostatic vital signs his heart rate was as low as 92 Consider increasing beta blocker if needed for blood pressure and rate control   [name], MD [facility] 02/28/2016, 8:16 AM

## 2016-02-28 NOTE — Patient Instructions (Signed)
Great to see you!  Stop clonidine.   Com eback for a blood pressure check with nursing in 2 weeks.   Come back to see me in 3 months

## 2016-03-02 ENCOUNTER — Encounter: Payer: Self-pay | Admitting: Pharmacist

## 2016-03-02 ENCOUNTER — Ambulatory Visit (INDEPENDENT_AMBULATORY_CARE_PROVIDER_SITE_OTHER): Payer: Commercial Managed Care - HMO | Admitting: Pharmacist

## 2016-03-02 VITALS — BP 142/72 | HR 62 | Ht 71.0 in | Wt 190.0 lb

## 2016-03-02 DIAGNOSIS — I10 Essential (primary) hypertension: Secondary | ICD-10-CM | POA: Diagnosis not present

## 2016-03-02 DIAGNOSIS — E663 Overweight: Secondary | ICD-10-CM

## 2016-03-02 DIAGNOSIS — I4892 Unspecified atrial flutter: Secondary | ICD-10-CM | POA: Diagnosis not present

## 2016-03-02 DIAGNOSIS — E119 Type 2 diabetes mellitus without complications: Secondary | ICD-10-CM

## 2016-03-02 LAB — COAGUCHEK XS/INR WAIVED
INR: 3.4 — ABNORMAL HIGH (ref 0.9–1.1)
Prothrombin Time: 40.8 s

## 2016-03-02 NOTE — Progress Notes (Signed)
Patient ID: QUAMIR WILLEMSEN, male   DOB: 1941/03/15, 75 y.o.   MRN: [id number] Subjective:     Indication: atrial fibrillation and atrial flutter Bleeding signs/symptoms: None Thromboembolic signs/symptoms: None  Missed Coumadin doses: None Medication changes: no Dietary changes: no Bacterial/viral infection: no Other concerns: yes - need to recheck BP since stopping clonidine due to dizziness.   BMI in overweight category.  Patient has been taking Trulicity since 38/8828 to control of diabetes and has lost 19#.  He is eating healthier - more vegetables and eating smaller meals.  He is also trying to exercise more.     Objective:    INR Today: 3.5  Current dose: warfarin '5mg'$  takes 1 tablet daily   Vitals:   03/02/16 0920  BP: (!) 142/72  Pulse: 62   Filed Weights   03/02/16 0920  Weight: 190 lb (86.2 kg)   body mass index is 26.5 kg/m.  Assessment:    Supratherapeutic INR for goal of 2-3  Overweight - improving Type 2 DM, controlled HTN with recent h/o dizziness related to hypotension  Plan:    1. New dose: no warfarin tonight, then restart warfarin '5mg'$  take 1 tablet daily   2. Next INR: 1 month   3.  Continue Trulicity 0.'75mg'$  q week for DM and weight 4.  Continue current BP meds 5.  Continue to limit high sugar and CHO containing foods and to keep portions small.  6.  Continue to exercise daily.   Cherre Robins, PharmD, CPP, CDE

## 2016-03-03 ENCOUNTER — Other Ambulatory Visit: Payer: Self-pay

## 2016-03-03 ENCOUNTER — Telehealth: Payer: Self-pay | Admitting: Family Medicine

## 2016-03-03 MED ORDER — LEVOTHYROXINE SODIUM 50 MCG PO TABS: 50.0000 ug | ORAL_TABLET | Freq: Every day | ORAL | 0 refills | Status: DC

## 2016-03-03 NOTE — Telephone Encounter (Signed)
Sent to Dr [name]

## 2016-03-07 ENCOUNTER — Ambulatory Visit: Payer: Commercial Managed Care - HMO

## 2016-03-07 VITALS — BP 119/71 | HR 96

## 2016-03-07 DIAGNOSIS — I10 Essential (primary) hypertension: Secondary | ICD-10-CM

## 2016-03-07 NOTE — Progress Notes (Signed)
Patient here today for BP Check.  119/71  Pulse 96

## 2016-03-09 ENCOUNTER — Telehealth: Payer: Self-pay | Admitting: Pharmacist

## 2016-03-09 NOTE — Telephone Encounter (Signed)
Patient states hsi BG was in 300's.  He took 40 units of lantus insulin.  Has not rechecked and is not current at home.  He is going to go straight home and check BG.   Patient rechecked BG and was 50 points lower.  Recommended he continue to check BG every hour for next 3 hours.  If it starts to increase he is to call me back.

## 2016-03-13 NOTE — Patient Instructions (Signed)
Timothy Guerrero  03/13/2016     '@PREFPERIOPPHARMACY'$ @   Your procedure is scheduled on 03/20/2016.  Report to [name] at 8:45 A.M.  Call this number if you have problems the morning of surgery:  [phone]   Remember:  Do not eat food or drink liquids after midnight.  Take these medicines the morning of surgery with A SIP OF WATER Amlodipine, Synthroid, Toprol, Prilosec, Spiriva  DO NOT TAKE DIABETIC MEDICATION DAY OF PROCEDURE   Do not wear jewelry, make-up or nail polish.  Do not wear lotions, powders, or perfumes, or deoderant.  Do not shave 48 hours prior to surgery.  Men may shave face and neck.  Do not bring valuables to the hospital.  [facility] is not responsible for any belongings or valuables.  Contacts, dentures or bridgework may not be worn into surgery.  Leave your suitcase in the car.  After surgery it may be brought to your room.  For patients admitted to the hospital, discharge time will be determined by your treatment team.  Patients discharged the day of surgery will not be allowed to drive home.    Please read over the following fact sheets that you were given. Anesthesia Post-op Instructions     PATIENT INSTRUCTIONS POST-ANESTHESIA  IMMEDIATELY FOLLOWING SURGERY:  Do not drive or operate machinery for the first twenty four hours after surgery.  Do not make any important decisions for twenty four hours after surgery or while taking narcotic pain medications or sedatives.  If you develop intractable nausea and vomiting or a severe headache please notify your doctor immediately.  FOLLOW-UP:  Please make an appointment with your surgeon as instructed. You do not need to follow up with anesthesia unless specifically instructed to do so.  WOUND CARE INSTRUCTIONS (if applicable):  Keep a dry clean dressing on the anesthesia/puncture wound site if there is drainage.  Once the wound has quit draining you may leave it open to air.  Generally you should leave  the bandage intact for twenty four hours unless there is drainage.  If the epidural site drains for more than 36-48 hours please call the anesthesia department.  QUESTIONS?:  Please feel free to call your physician or the hospital operator if you have any questions, and they will be happy to assist you.       A cataract is a clouding of the lens of the eye. When a lens becomes cloudy, vision is reduced based on the degree and nature of the clouding. Surgery may be needed to improve vision. Surgery removes the cloudy lens and usually replaces it with a substitute lens (intraocular lens, IOL). LET YOUR EYE DOCTOR KNOW ABOUT:  Allergies to food or medicine.  Medicines taken including herbs, eye drops, over-the-counter medicines, and creams.  Use of steroids (by mouth or creams).  Previous problems with anesthetics or numbing medicine.  History of bleeding problems or blood clots.  Previous surgery.  Other health problems, including diabetes and kidney problems.  Possibility of pregnancy, if this applies. RISKS AND COMPLICATIONS  Infection.  Inflammation of the eyeball (endophthalmitis) that can spread to both eyes (sympathetic ophthalmia).  Poor wound healing.  If an IOL is inserted, it can later fall out of proper position. This is very uncommon.  Clouding of the part of your eye that holds an IOL in place. This is called an "after-cataract." These are uncommon but easily treated. BEFORE THE PROCEDURE  Do not eat or drink anything except small amounts  of water for 8 to 12 before your surgery, or as directed by your caregiver.  Unless you are told otherwise, continue any eye drops you have been prescribed.  Talk to your primary caregiver about all other medicines that you take (both prescription and nonprescription). In some cases, you may need to stop or change medicines near the time of your surgery. This is most important if you are taking blood-thinning medicine.Do not stop  medicines unless you are told to do so.  Arrange for someone to drive you to and from the procedure.  Do not put contact lenses in either eye on the day of your surgery. PROCEDURE There is more than one method for safely removing a cataract. Your doctor can explain the differences and help determine which is best for you. Phacoemulsification surgery is the most common form of cataract surgery.  An injection is given behind the eye or eye drops are given to make this a painless procedure.  A small cut (incision) is made on the edge of the clear, dome-shaped surface that covers the front of the eye (cornea).  A tiny probe is painlessly inserted into the eye. This device gives off ultrasound waves that soften and break up the cloudy center of the lens. This makes it easier for the cloudy lens to be removed by suction.  An IOL may be implanted.  The normal lens of the eye is covered by a clear capsule. Part of that capsule is intentionally left in the eye to support the IOL.  Your surgeon may or may not use stitches to close the incision. There are other forms of cataract surgery that require a larger incision and stitches to close the eye. This approach is taken in cases where the doctor feels that the cataract cannot be easily removed using phacoemulsification. AFTER THE PROCEDURE  When an IOL is implanted, it does not need care. It becomes a permanent part of your eye and cannot be seen or felt.  Your doctor will schedule follow-up exams to check on your progress.  Review your other medicines with your doctor to see which can be resumed after surgery.  Use eye drops or take medicine as prescribed by your doctor.   This information is not intended to replace advice given to you by your health care provider. Make sure you discuss any questions you have with your health care provider.   Document Released: 06/08/2011 Document Revised: 07/10/2014 Document Reviewed: 06/08/2011 Elsevier  Interactive Patient Education [employer].

## 2016-03-14 ENCOUNTER — Encounter (HOSPITAL_COMMUNITY)
Admission: RE | Admit: 2016-03-14 | Discharge: 2016-03-14 | Disposition: A | Discharge status: Home / Self Care | Payer: No Typology Code available for payment source | Source: Ambulatory Visit | Attending: Ophthalmology | Admitting: Ophthalmology

## 2016-03-14 ENCOUNTER — Encounter (HOSPITAL_COMMUNITY): Payer: Self-pay

## 2016-03-14 DIAGNOSIS — Z01812 Encounter for preprocedural laboratory examination: Secondary | ICD-10-CM | POA: Insufficient documentation

## 2016-03-14 DIAGNOSIS — H268 Other specified cataract: Secondary | ICD-10-CM | POA: Diagnosis not present

## 2016-03-14 LAB — CBC
HCT: 30.6 % — ABNORMAL LOW (ref 39.0–52.0)
Hemoglobin: 9.2 g/dL — ABNORMAL LOW (ref 13.0–17.0)
MCH: 23.5 pg — ABNORMAL LOW (ref 26.0–34.0)
MCHC: 30.1 g/dL (ref 30.0–36.0)
MCV: 78.3 fL (ref 78.0–100.0)
Platelets: 381 10*3/uL (ref 150–400)
RBC: 3.91 MIL/uL — ABNORMAL LOW (ref 4.22–5.81)
RDW: 15.3 % (ref 11.5–15.5)
WBC: 9.5 10*3/uL (ref 4.0–10.5)

## 2016-03-14 LAB — BASIC METABOLIC PANEL
Anion gap: 10 (ref 5–15)
BUN: 18 mg/dL (ref 6–20)
CO2: 26 mmol/L (ref 22–32)
Calcium: 9.4 mg/dL (ref 8.9–10.3)
Chloride: 105 mmol/L (ref 101–111)
Creatinine, Ser: 0.81 mg/dL (ref 0.61–1.24)
GFR calc Af Amer: >60 mL/min (ref >60)
GFR calc non Af Amer: >60 mL/min (ref >60)
Glucose, Bld: 184 mg/dL — ABNORMAL HIGH (ref 65–99)
Potassium: 3.6 mmol/L (ref 3.5–5.1)
Sodium: 141 mmol/L (ref 135–145)

## 2016-03-14 MED ORDER — ONDANSETRON HCL 4 MG/2ML IJ SOLN: 4.0000 mg | Freq: Once | INTRAMUSCULAR | Status: DC | PRN

## 2016-03-14 MED ORDER — TETRACAINE HCL 0.5 % OP SOLN: 1.0000 [drp] | OPHTHALMIC | Status: DC

## 2016-03-14 MED ORDER — LIDOCAINE HCL 3.5 % OP GEL: 1.0000 "application " | Freq: Once | OPHTHALMIC | Status: DC

## 2016-03-14 MED ORDER — CYCLOPENTOLATE-PHENYLEPHRINE 0.2-1 % OP SOLN: 1.0000 [drp] | OPHTHALMIC | Status: DC

## 2016-03-14 MED ORDER — FENTANYL CITRATE (PF) 100 MCG/2ML IJ SOLN: 25.0000 ug | INTRAMUSCULAR | Status: DC | PRN

## 2016-03-15 ENCOUNTER — Other Ambulatory Visit: Payer: Self-pay | Admitting: Family Medicine

## 2016-03-20 ENCOUNTER — Encounter (HOSPITAL_COMMUNITY)
Admission: RE | Disposition: A | Discharge status: Home / Self Care | Payer: Self-pay | Source: Ambulatory Visit | Attending: Ophthalmology

## 2016-03-20 ENCOUNTER — Ambulatory Visit (HOSPITAL_COMMUNITY): Payer: No Typology Code available for payment source | Admitting: Anesthesiology

## 2016-03-20 ENCOUNTER — Ambulatory Visit (HOSPITAL_COMMUNITY)
Admission: RE | Admit: 2016-03-20 | Discharge: 2016-03-20 | Disposition: A | Discharge status: Home / Self Care | Payer: No Typology Code available for payment source | Source: Ambulatory Visit | Attending: Ophthalmology | Admitting: Ophthalmology

## 2016-03-20 ENCOUNTER — Encounter (HOSPITAL_COMMUNITY): Payer: Self-pay | Admitting: *Deleted

## 2016-03-20 DIAGNOSIS — H269 Unspecified cataract: Secondary | ICD-10-CM | POA: Diagnosis not present

## 2016-03-20 DIAGNOSIS — G4730 Sleep apnea, unspecified: Secondary | ICD-10-CM | POA: Diagnosis not present

## 2016-03-20 DIAGNOSIS — I4891 Unspecified atrial fibrillation: Secondary | ICD-10-CM | POA: Insufficient documentation

## 2016-03-20 DIAGNOSIS — Z87891 Personal history of nicotine dependence: Secondary | ICD-10-CM | POA: Diagnosis not present

## 2016-03-20 DIAGNOSIS — E119 Type 2 diabetes mellitus without complications: Secondary | ICD-10-CM | POA: Insufficient documentation

## 2016-03-20 DIAGNOSIS — Z7984 Long term (current) use of oral hypoglycemic drugs: Secondary | ICD-10-CM | POA: Insufficient documentation

## 2016-03-20 DIAGNOSIS — I10 Essential (primary) hypertension: Secondary | ICD-10-CM | POA: Insufficient documentation

## 2016-03-20 DIAGNOSIS — I2510 Atherosclerotic heart disease of native coronary artery without angina pectoris: Secondary | ICD-10-CM | POA: Diagnosis not present

## 2016-03-20 DIAGNOSIS — H538 Other visual disturbances: Secondary | ICD-10-CM | POA: Diagnosis not present

## 2016-03-20 DIAGNOSIS — H2511 Age-related nuclear cataract, right eye: Secondary | ICD-10-CM | POA: Diagnosis not present

## 2016-03-20 HISTORY — PX: CATARACT EXTRACTION W/PHACO: SHX586

## 2016-03-20 LAB — GLUCOSE, CAPILLARY: Glucose-Capillary: 151 mg/dL — ABNORMAL HIGH (ref 65–99)

## 2016-03-20 SURGERY — PHACOEMULSIFICATION, CATARACT, WITH IOL INSERTION
Anesthesia: Monitor Anesthesia Care | Site: Eye | Laterality: Right

## 2016-03-20 MED ORDER — CYCLOPENTOLATE-PHENYLEPHRINE 0.2-1 % OP SOLN
1.0000 [drp] | OPHTHALMIC | Status: AC
  Administered 2016-03-20 (×3): 1 [drp] via OPHTHALMIC

## 2016-03-20 MED ORDER — LIDOCAINE HCL 3.5 % OP GEL: 1.0000 "application " | OPHTHALMIC | Status: AC

## 2016-03-20 MED ORDER — LACTATED RINGERS IV SOLN
INTRAVENOUS | Status: DC
  Administered 2016-03-20: 1000 mL via INTRAVENOUS

## 2016-03-20 MED ORDER — BSS IO SOLN
INTRAOCULAR | Status: DC | PRN
  Administered 2016-03-20: 15 mL via INTRAOCULAR

## 2016-03-20 MED ORDER — PHENYLEPHRINE-KETOROLAC 1-0.3 % IO SOLN
INTRAOCULAR | Status: AC
  Filled 2016-03-20: qty 4

## 2016-03-20 MED ORDER — LIDOCAINE HCL 3.5 % OP GEL
OPHTHALMIC | Status: DC | PRN
  Administered 2016-03-20: 1 via OPHTHALMIC

## 2016-03-20 MED ORDER — PHENYLEPHRINE-KETOROLAC 1-0.3 % IO SOLN
INTRAOCULAR | Status: DC | PRN
  Administered 2016-03-20: 500 mL via OPHTHALMIC

## 2016-03-20 MED ORDER — FENTANYL CITRATE (PF) 100 MCG/2ML IJ SOLN
25.0000 ug | INTRAMUSCULAR | Status: AC | PRN
  Administered 2016-03-20 (×2): 25 ug via INTRAVENOUS

## 2016-03-20 MED ORDER — NA HYALUR & NA CHOND-NA HYALUR 0.55-0.5 ML IO KIT
PACK | INTRAOCULAR | Status: DC | PRN
  Administered 2016-03-20: 1 via OPHTHALMIC

## 2016-03-20 MED ORDER — TETRACAINE HCL 0.5 % OP SOLN
1.0000 [drp] | OPHTHALMIC | Status: AC | PRN
  Administered 2016-03-20 (×3): 1 [drp] via OPHTHALMIC

## 2016-03-20 MED ORDER — MIDAZOLAM HCL 2 MG/2ML IJ SOLN
1.0000 mg | INTRAMUSCULAR | Status: DC | PRN
  Administered 2016-03-20: 2 mg via INTRAVENOUS

## 2016-03-20 MED ORDER — TETRACAINE 0.5 % OP SOLN OPTIME - NO CHARGE
OPHTHALMIC | Status: DC | PRN
  Administered 2016-03-20: 1 [drp] via OPHTHALMIC

## 2016-03-20 MED ORDER — POVIDONE-IODINE 5 % OP SOLN
OPHTHALMIC | Status: DC | PRN
  Administered 2016-03-20: 1 via OPHTHALMIC

## 2016-03-20 MED ORDER — MIDAZOLAM HCL 2 MG/2ML IJ SOLN
INTRAMUSCULAR | Status: AC
  Filled 2016-03-20: qty 2

## 2016-03-20 MED ORDER — FENTANYL CITRATE (PF) 100 MCG/2ML IJ SOLN
INTRAMUSCULAR | Status: AC
  Filled 2016-03-20: qty 2

## 2016-03-20 SURGICAL SUPPLY — 20 items
CAPSULAR TENSION RING-AMO (OPHTHALMIC RELATED) IMPLANT
CLOTH BEACON ORANGE TIMEOUT ST (SAFETY) ×2 IMPLANT
GLOVE BIOGEL PI IND STRL 7.0 (GLOVE) ×1 IMPLANT
GLOVE BIOGEL PI INDICATOR 7.0 (GLOVE) ×1
GLOVE EXAM NITRILE LRG STRL (GLOVE) IMPLANT
GLOVE EXAM NITRILE MD LF STRL (GLOVE) ×2 IMPLANT
GLOVE SKINSENSE NS SZ6.5 (GLOVE)
GLOVE SKINSENSE STRL SZ6.5 (GLOVE) IMPLANT
INST SET CATARACT ~~LOC~~ (KITS) ×2 IMPLANT
KIT VITRECTOMY (OPHTHALMIC RELATED) IMPLANT
LENS ALC ACRYL/TECN (Ophthalmic Related) ×2 IMPLANT
PAD ARMBOARD 7.5X6 YLW CONV (MISCELLANEOUS) ×2 IMPLANT
PROC W NO LENS (INTRAOCULAR LENS)
PROC W SPEC LENS (INTRAOCULAR LENS)
PROCESS W NO LENS (INTRAOCULAR LENS) IMPLANT
PROCESS W SPEC LENS (INTRAOCULAR LENS) IMPLANT
RETRACTOR IRIS SIGHTPATH (OPHTHALMIC RELATED) IMPLANT
RING MALYGIN (MISCELLANEOUS) IMPLANT
VISCOELASTIC ADDITIONAL (OPHTHALMIC RELATED) IMPLANT
WATER STERILE IRR 250ML POUR (IV SOLUTION) ×2 IMPLANT

## 2016-03-20 NOTE — Brief Op Note (Signed)
03/20/2016  11:11 AM  PATIENT:  [name]  75 y.o. male  PRE-OPERATIVE DIAGNOSIS:  nuclear cataract right eye  POST-OPERATIVE DIAGNOSIS:  nuclear cataract right eye  PROCEDURE:  Procedure(s): CATARACT EXTRACTION PHACO AND INTRAOCULAR LENS PLACEMENT; CDE:  9.58  SURGEON:  Surgeon(s): Williams Che, MD  ASSISTANTS: Bonney Roussel, CST   ANESTHESIA STAFF: Anesthesiologist: Lerry Liner, MD CRNA: Ollen Bowl, CRNA  ANESTHESIA:   topical and MAC  REQUESTED LENS POWER: 19.5  LENS IMPLANT INFORMATION:  Alcon [id number]  S/n [id number]  Exp 01/2020  CUMULATIVE DISSIPATED ENERGY:9.58  INDICATIONS:see scanned office H&P for details  OP FINDINGS:dense ns  COMPLICATIONS:None  DICTATION #: none  PLAN OF CARE: as above  PATIENT DISPOSITION:  Short Stay

## 2016-03-20 NOTE — Anesthesia Preprocedure Evaluation (Signed)
Anesthesia Evaluation  Patient identified by MRN, date of birth, ID band Patient awake    Reviewed: Allergy & Precautions, NPO status , Patient's Chart, lab work & pertinent test results, reviewed documented beta blocker date and time   Airway Mallampati: II  TM Distance: >3 FB Neck ROM: Full    Dental  (+) Poor Dentition, Partial Lower, Missing, Dental Advisory Given   Pulmonary shortness of breath, sleep apnea , former smoker,  RL Lobectomy for CA   breath sounds clear to auscultation       Cardiovascular hypertension, Pt. on medications and Pt. on home beta blockers + CAD  + dysrhythmias Atrial Fibrillation  Rhythm:Regular Rate:Normal     Neuro/Psych negative neurological ROS     GI/Hepatic negative GI ROS, Neg liver ROS,   Endo/Other  diabetes, Type 2, Oral Hypoglycemic Agents  Renal/GU negative Renal ROS     Musculoskeletal   Abdominal   Peds  Hematology negative hematology ROS (+)   Anesthesia Other Findings   Reproductive/Obstetrics                             Anesthesia Physical Anesthesia Plan  ASA: III  Anesthesia Plan: MAC   Post-op Pain Management:    Induction: Intravenous  Airway Management Planned: Nasal Cannula  Additional Equipment:   Intra-op Plan:   Post-operative Plan:   Informed Consent: I have reviewed the patients History and Physical, chart, labs and discussed the procedure including the risks, benefits and alternatives for the proposed anesthesia with the patient or authorized representative who has indicated his/her understanding and acceptance.     Plan Discussed with:   Anesthesia Plan Comments:         Anesthesia Quick Evaluation

## 2016-03-20 NOTE — Anesthesia Postprocedure Evaluation (Signed)
Anesthesia Post Note  Patient: Timothy Guerrero  Procedure(s) Performed: Procedure(s) (LRB): CATARACT EXTRACTION PHACO AND INTRAOCULAR LENS PLACEMENT; CDE:  9.58 (Right)  Patient location during evaluation: Short Stay Anesthesia Type: MAC Level of consciousness: awake and alert and oriented Pain management: pain level controlled Vital Signs Assessment: post-procedure vital signs reviewed and stable Respiratory status: spontaneous breathing Cardiovascular status: blood pressure returned to baseline Postop Assessment: no signs of nausea or vomiting Anesthetic complications: no    Last Vitals:  Vitals:   03/20/16 1025 03/20/16 1030  BP: 114/64 115/71  Pulse:    Resp: 17 18  Temp:      Last Pain:  Vitals:   03/20/16 0930  TempSrc: Oral                 [name]

## 2016-03-20 NOTE — Op Note (Signed)
03/20/2016  11:11 AM  PATIENT:  Timothy Guerrero  75 y.o. male  PRE-OPERATIVE DIAGNOSIS:  nuclear cataract right eye  POST-OPERATIVE DIAGNOSIS:  nuclear cataract right eye  PROCEDURE:  Procedure(s): CATARACT EXTRACTION PHACO AND INTRAOCULAR LENS PLACEMENT; CDE:  9.58  SURGEON:  Surgeon(s): Williams Che, MD  ASSISTANTS: Bonney Roussel, CST   ANESTHESIA STAFF: Anesthesiologist: Lerry Liner, MD CRNA: Ollen Bowl, CRNA  ANESTHESIA:   topical and MAC  REQUESTED LENS POWER: 19.5  LENS IMPLANT INFORMATION:  Alcon [id number]  S/n [id number]  Exp 01/2020  CUMULATIVE DISSIPATED ENERGY:9.58  INDICATIONS:see scanned office H&P for details  OP FINDINGS:dense ns  COMPLICATIONS:None  PROCEDURE:  The patient was brought to the operating room in good condition.  The operative eye was prepped and draped in the usual fashion for intraocular surgery.  Lidocaine gel was dropped onto the eye.  A 2.4 mm 10 O'clock near clear corneal stepped incision and a 12 O'clock stab incision were created.  Viscoat was instilled into the anterior chamber.  The 5 mm anterior capsulorhexis was performed with a bent needle cystotome and Utrata forceps.  The lens was hydrodissected and hydrodelineated with a cannula and balanced salt solution and rotated with a Kuglen hook.  Phacoemulsification was perfomed in the divide and conquer technique.  The remaining cortex was removed with I&A and the capsular surfaces polished as necessary.  Provisc was placed into the capsular bag and the lens inserted with the Alcon inserter.  The viscoelastic was removed with I&A and the lens "rocked" into position.  The wounds were hydrated and te anterior chamber was refilled with balanced salt solution.  The wounds were checked for leakage and rehydrated as necessary.  The lid speculum and drapes were removed and the patient was transported to short stay in good condition.  PATIENT DISPOSITION:  Short Stay

## 2016-03-20 NOTE — Transfer of Care (Signed)
Immediate Anesthesia Transfer of Care Note  Patient: Timothy Guerrero  Procedure(s) Performed: Procedure(s): CATARACT EXTRACTION PHACO AND INTRAOCULAR LENS PLACEMENT; CDE:  9.58 (Right)  Patient Location: Short Stay  Anesthesia Type:MAC  Level of Consciousness: awake  Airway & Oxygen Therapy: Patient Spontanous Breathing  Post-op Assessment: Report given to RN  Post vital signs: Reviewed and stable  Last Vitals:  Vitals:   03/20/16 1025 03/20/16 1030  BP: 114/64 115/71  Pulse:    Resp: 17 18  Temp:      Last Pain:  Vitals:   03/20/16 0930  TempSrc: Oral      Patients Stated Pain Goal: 7 (86/76/72 0947)  Complications: No apparent anesthesia complications

## 2016-03-20 NOTE — H&P (Signed)
I have reviewed the pre printed H&P, the patient was re-examined, and I have identified no significant interval changes in the patient's medical condition.  There is no change in the plan of care since the history and physical of record. 

## 2016-03-21 ENCOUNTER — Other Ambulatory Visit: Payer: Self-pay | Admitting: Family Medicine

## 2016-03-23 ENCOUNTER — Encounter (HOSPITAL_COMMUNITY): Payer: Self-pay | Admitting: Ophthalmology

## 2016-03-24 ENCOUNTER — Other Ambulatory Visit: Payer: Self-pay | Admitting: *Deleted

## 2016-03-24 ENCOUNTER — Telehealth: Payer: Self-pay | Admitting: Family Medicine

## 2016-03-24 DIAGNOSIS — M25511 Pain in right shoulder: Secondary | ICD-10-CM

## 2016-03-24 NOTE — Telephone Encounter (Signed)
Patient aware and referral placed to [facility]

## 2016-03-24 NOTE — Telephone Encounter (Signed)
Patient called stating that he has been having right shoulder pain for 3-4 months and would like to have a cortisone shot.

## 2016-03-24 NOTE — Telephone Encounter (Signed)
Will ask for ortho given that he is on coumadin.   [name], MD [facility] 03/24/2016, 12:04 PM

## 2016-03-27 ENCOUNTER — Ambulatory Visit: Payer: Commercial Managed Care - HMO | Admitting: Family Medicine

## 2016-03-28 ENCOUNTER — Telehealth: Payer: Self-pay | Admitting: Family Medicine

## 2016-03-28 NOTE — Telephone Encounter (Signed)
Not yet  [facility] will call him to set up

## 2016-03-30 ENCOUNTER — Ambulatory Visit (INDEPENDENT_AMBULATORY_CARE_PROVIDER_SITE_OTHER): Payer: Commercial Managed Care - HMO | Admitting: Family Medicine

## 2016-03-30 ENCOUNTER — Encounter: Payer: Self-pay | Admitting: Family Medicine

## 2016-03-30 ENCOUNTER — Ambulatory Visit (HOSPITAL_COMMUNITY)
Admission: RE | Admit: 2016-03-30 | Discharge: 2016-03-30 | Disposition: A | Discharge status: Home / Self Care | Payer: No Typology Code available for payment source | Source: Ambulatory Visit | Attending: Family Medicine | Admitting: Family Medicine

## 2016-03-30 VITALS — BP 94/60 | HR 102 | Temp 97.0°F | Ht 73.83 in | Wt 188.0 lb

## 2016-03-30 DIAGNOSIS — M21371 Foot drop, right foot: Secondary | ICD-10-CM | POA: Diagnosis not present

## 2016-03-30 DIAGNOSIS — E118 Type 2 diabetes mellitus with unspecified complications: Secondary | ICD-10-CM

## 2016-03-30 DIAGNOSIS — I10 Essential (primary) hypertension: Secondary | ICD-10-CM | POA: Diagnosis not present

## 2016-03-30 DIAGNOSIS — G319 Degenerative disease of nervous system, unspecified: Secondary | ICD-10-CM | POA: Insufficient documentation

## 2016-03-30 MED ORDER — INSULIN GLARGINE 100 UNIT/ML SOLOSTAR PEN: 20.0000 [IU] | PEN_INJECTOR | Freq: Every day | SUBCUTANEOUS | 99 refills | Status: DC

## 2016-03-30 NOTE — Progress Notes (Signed)
   HPI  Patient presents today here with elevated blood sugars and "feeling like he's had a stroke".  Stroke symptoms Patient describes foot drop and dragging his right foot for about 3 or 4 days, he also complains of dizziness, numbness in his right hand, and extreme fatigue. The fatigue is been going on longer than this and has been going on about 3 months.  His blood sugars been ranging 250-325 fasting, he states that he takes 30 units of Lantus when this happens. He is only taking Lantus 3 or 4 times a week, only in reaction to the elevated blood sugar.  PMH: Smoking status noted ROS: Per HPI  Objective: BP 94/60 (BP Location: Right Arm, Patient Position: Standing, Cuff Size: Normal)   Pulse (!) 102   Temp 97 F (36.1 C) (Oral)   Ht 6' 1.83" (1.875 m)   Wt 188 lb (85.3 kg)   BMI 24.25 kg/m  Gen: NAD, alert, cooperative with exam HEENT: NCAT, EOMI, PERRL CV: RRR, good S1/S2, no murmur Resp: CTABL, no wheezes, non-labored Ext: No edema, warm Neuro: Alert and oriented, strength 5/5 and sensation intact in bilateral lower extremities, cranial nerves II through XII intact  Assessment and plan:  # Right foot drop - Possible CVA Patient with right foot drop and right hand numbness, given his comorbidities and age I have recommended stat MRI to evaluate for stroke His neurologic exam is overall very reassuring  # Dizziness, orthostatic hypotension Symptomatic hypotension, initial blood pressure today is 103. , On orthostatics he ranges from 123 sitting to 98 lying and 94 standing. His pulse did not change much.  # HTN Overtreated likely We have stopped clonidine recently. He has been taking lisinopril, chlorthalidone, metoprolol, and amlodipine. Stop amlodipine.  Type 2 diabetes Uncontrolled with fasting blood sugars 250 most the time Restart Lantus 20 units once a, increase by 2 units every 4 days until fasting blood sugar is 150-200 Explained to patient not to go above  40 units. Continue trulicity  Follow-up 5-6 days   Orders Placed This Encounter  Procedures  . MR Brain Wo Contrast    Standing Status:   Future    Standing Expiration Date:   05/30/2017    Order Specific Question:   Reason for Exam (SYMPTOM  OR DIAGNOSIS REQUIRED)    Answer:   eval for stroke    Order Specific Question:   Preferred imaging location?    Answer:   [facility] (table limit-350lbs)    Order Specific Question:   What is the patient's sedation requirement?    Answer:   No Sedation    Order Specific Question:   Does the patient have a pacemaker or implanted devices?    Answer:   No    Meds ordered this encounter  Medications  . Insulin Glargine (LANTUS SOLOSTAR) 100 UNIT/ML Solostar Pen    Sig: Inject 20-40 Units into the skin daily at 10 pm.    Dispense:  5 pen    Refill:  PRN    Timothy Apple, MD [facility] 03/30/2016, 2:22 PM

## 2016-03-30 NOTE — Patient Instructions (Addendum)
Great to see you !  Start lantus back 20 units daily, you may increase by 2 units every 3-4 days until your blood sugar is 150 to 200 fasting.   Stop amlodipine  Come back next Monday or Tuesday to follow up for dizziness

## 2016-04-03 ENCOUNTER — Inpatient Hospital Stay (HOSPITAL_COMMUNITY): Admission: RE | Admit: 2016-04-03 | Payer: No Typology Code available for payment source | Source: Ambulatory Visit

## 2016-04-03 DIAGNOSIS — M19011 Primary osteoarthritis, right shoulder: Secondary | ICD-10-CM | POA: Diagnosis not present

## 2016-04-04 ENCOUNTER — Ambulatory Visit (INDEPENDENT_AMBULATORY_CARE_PROVIDER_SITE_OTHER): Payer: Commercial Managed Care - HMO | Admitting: Pharmacist

## 2016-04-04 ENCOUNTER — Telehealth: Payer: Self-pay | Admitting: Family Medicine

## 2016-04-04 DIAGNOSIS — I4892 Unspecified atrial flutter: Secondary | ICD-10-CM | POA: Diagnosis not present

## 2016-04-04 LAB — COAGUCHEK XS/INR WAIVED
INR: 3.6 — ABNORMAL HIGH (ref 0.9–1.1)
Prothrombin Time: 43.7 s

## 2016-04-04 NOTE — Telephone Encounter (Signed)
Patient has steroid injection yesterday.  BG was 302.  He took Lantus 20 units this am and his other meds.  Recommend he take 10 additional units of lantus today.  Recheck BG in pm and in am.  If still elevated over 300 tomorrow then call me for instructions.

## 2016-04-05 ENCOUNTER — Telehealth: Payer: Self-pay | Admitting: Family Medicine

## 2016-04-05 ENCOUNTER — Ambulatory Visit (HOSPITAL_COMMUNITY): Payer: No Typology Code available for payment source | Admitting: Oncology

## 2016-04-05 NOTE — Telephone Encounter (Signed)
Noted - so glad that BG is down from yesterday's readings.

## 2016-04-11 ENCOUNTER — Encounter: Payer: Self-pay | Admitting: Family Medicine

## 2016-04-11 ENCOUNTER — Ambulatory Visit (INDEPENDENT_AMBULATORY_CARE_PROVIDER_SITE_OTHER): Payer: Commercial Managed Care - HMO | Admitting: Family Medicine

## 2016-04-11 VITALS — BP 141/74 | HR 90 | Temp 97.0°F | Ht 73.83 in | Wt 183.4 lb

## 2016-04-11 DIAGNOSIS — E118 Type 2 diabetes mellitus with unspecified complications: Secondary | ICD-10-CM

## 2016-04-11 DIAGNOSIS — I10 Essential (primary) hypertension: Secondary | ICD-10-CM

## 2016-04-11 DIAGNOSIS — I951 Orthostatic hypotension: Secondary | ICD-10-CM

## 2016-04-11 NOTE — Patient Instructions (Signed)
Great to see you!  Come back to see me in 2 weeks

## 2016-04-11 NOTE — Progress Notes (Signed)
   HPI  Patient presents today here with dizziness and fatigue.  Patient was seen on 03/30/2016 and found to have orthostatic hypotension. At that time we stopped amlodipine. Over the weekend he discussed his dizziness with his daughter-in-law, who is a Pharmacist, community, she stopped lisinopril and chlorthalidone.  Over the weekend his dizziness has improved slightly but he persistently has some dizziness with standing.  Patient also complains of fatigue and weight loss, this started after taking Trulicity, he skipped his injection Monday and would like to stop  Resting blood sugars over the last 10 days have ranged 110-370, mostly 150-200 No hypoglycemia Currently taking 20 units of Lantus daily.   PMH: Smoking status noted ROS: Per HPI  Objective: BP (!) 141/74   Pulse 90   Temp 97 F (36.1 C) (Oral)   Ht 6' 1.83" (1.875 m)   Wt 183 lb 6.4 oz (83.2 kg)   BMI 23.66 kg/m  Gen: NAD, alert, cooperative with exam HEENT: NCAT CV: RRR, good S1/S2, no murmur Resp: CTABL, no wheezes, non-labored Ext: No edema, warm Neuro: Alert and oriented, No gross deficits  Assessment and plan:  # Hypertension Elevated today, expected stopping medications Explained that he will likely have to restart lisinopril, however stopping chlorthalidone may be a very wise choice given his orthostatic hypotension  # Orthostatic hypotension Stop chlorthalidone, restart lisinopril in 2-3 days Continue metoprolol  # Type 2 diabetes Uncontrolled ( A1C due in 3 days) has follow up in 8 days with clinical pharmacist Stopping trulicity, not tolerating He is considering endocrinology ( daughter in law dentist Is recommendeing) I'm ok if needed but I think there is plenty of room to work still Consider jiardiance, not staring today with lingering effects of trulicity on board  [name], MD [place] 04/11/2016, 8:32 AM

## 2016-04-14 ENCOUNTER — Telehealth: Payer: Self-pay | Admitting: Family Medicine

## 2016-04-14 NOTE — Telephone Encounter (Signed)
Patient just wanted you to know that since he has stopped the two BP meds that his BP has been good and he hasn't had to start taking the meds again. FYI

## 2016-04-14 NOTE — Telephone Encounter (Signed)
Appreciate [place], MD [facility] 04/14/2016, 1:25 PM

## 2016-04-19 ENCOUNTER — Ambulatory Visit (INDEPENDENT_AMBULATORY_CARE_PROVIDER_SITE_OTHER): Payer: Commercial Managed Care - HMO | Admitting: Pharmacist

## 2016-04-19 DIAGNOSIS — I4892 Unspecified atrial flutter: Secondary | ICD-10-CM | POA: Diagnosis not present

## 2016-04-19 DIAGNOSIS — Z23 Encounter for immunization: Secondary | ICD-10-CM | POA: Diagnosis not present

## 2016-04-19 LAB — COAGUCHEK XS/INR WAIVED
INR: 2.6 — ABNORMAL HIGH (ref 0.9–1.1)
Prothrombin Time: 30.8 s

## 2016-04-24 ENCOUNTER — Encounter: Payer: Self-pay | Admitting: Family Medicine

## 2016-04-24 ENCOUNTER — Ambulatory Visit: Payer: Commercial Managed Care - HMO | Admitting: Family Medicine

## 2016-04-24 ENCOUNTER — Ambulatory Visit (INDEPENDENT_AMBULATORY_CARE_PROVIDER_SITE_OTHER): Payer: Commercial Managed Care - HMO | Admitting: Family Medicine

## 2016-04-24 VITALS — BP 157/72 | HR 92 | Temp 97.8°F | Ht 73.83 in | Wt 196.8 lb

## 2016-04-24 DIAGNOSIS — G588 Other specified mononeuropathies: Secondary | ICD-10-CM | POA: Diagnosis not present

## 2016-04-24 DIAGNOSIS — I10 Essential (primary) hypertension: Secondary | ICD-10-CM | POA: Diagnosis not present

## 2016-04-24 DIAGNOSIS — S61217A Laceration without foreign body of left little finger without damage to nail, initial encounter: Secondary | ICD-10-CM

## 2016-04-24 DIAGNOSIS — Z23 Encounter for immunization: Secondary | ICD-10-CM

## 2016-04-24 DIAGNOSIS — G5731 Lesion of lateral popliteal nerve, right lower limb: Secondary | ICD-10-CM

## 2016-04-24 DIAGNOSIS — R0602 Shortness of breath: Secondary | ICD-10-CM

## 2016-04-24 MED ORDER — LISINOPRIL 20 MG PO TABS: 20.0000 mg | ORAL_TABLET | Freq: Every day | ORAL | 2 refills | Status: DC

## 2016-04-24 NOTE — Progress Notes (Signed)
   HPI  Patient presents today here in finger laceration  Patient was washing some new glasses he got from the flea market yesterday and rubbed his finger along the edge between his fourth and fifth digit of the left hand, discussed the laceration and severe bleeding. Bleeding has stopped, patient complains of some pain in the area but no erythema, warmth, or drainage.  Right lateral leg Notices tingling and shooting type pain whenever his lateral knee is hip that extends down to his foot, hurts as bad as "having his foot stepped on".  Hypertension Has stopped lisinopril, has taken once since our last visit given high blood pressure.  Does complain of some increased shortness of breath lately, it is mostly with activity. He easily had good improvement of this similar type of shortness of breath with starting Spiriva. He denies chest pain.  PMH: Smoking status noted ROS: Per HPI  Objective: BP (!) 157/72   Pulse 92   Temp 97.8 F (36.6 C) (Oral)   Ht 6' 1.83" (1.875 m)   Wt 196 lb 12.8 oz (89.3 kg)   BMI 25.38 kg/m  Gen: NAD, alert, cooperative with exam HEENT: NCAT CV: RRR, good S1/S2, no murmur Resp: Nonlabored Ext: No edema, warm Neuro: Alert and oriented, No gross deficits  Skin Left hand with laceration on the fifth digit at the base between the fourth and fifth digit Measuring 1.4 x 0.8 cm, no fluctuance, tenderness to palpation, erythema, or drainage. No bleeding  Assessment and plan:  # Finger laceration Bleeding controlled well, no signs of infection Tetanus injection given  # Hypertension Elevated today Recommended starting lisinopril 20 mg daily Continue metoprolol  # Neuralgia of the right peroneal nerve Symptoms consistent with peroneal nerve neuralgia No injury Monitor  # Rawness of breath Suspicious for COPD Start Symbicort Refer to pulmonology for PFTs Previous smoker, stopped 30 years ago    Orders Placed This Encounter  Procedures    . Tdap vaccine greater than or equal to 7yo IM  . Ambulatory referral to Pulmonology    Referral Priority:   Routine    Referral Type:   Consultation    Referral Reason:   Specialty Services Required    Requested Specialty:   Pulmonary Disease    Number of Visits Requested:   1    Meds ordered this encounter  Medications  . lisinopril (PRINIVIL,ZESTRIL) 20 MG tablet    Sig: Take 1 tablet (20 mg total) by mouth daily.    Dispense:  30 tablet    Refill:  [place], MD [facility] 04/24/2016, 5:13 PM

## 2016-04-24 NOTE — Patient Instructions (Addendum)
Great to see you!  Start symbicort, 2 puffs twice daily.   Continue spiriva  Pulmonology will call for an appointment  Start back with 1/2 pill lisinopril daily ( I have sent a 20 mg pill for when you run out)  Come back in 1 month for BP check and breathing check

## 2016-04-25 ENCOUNTER — Telehealth: Payer: Self-pay | Admitting: Family Medicine

## 2016-04-28 ENCOUNTER — Institutional Professional Consult (permissible substitution): Payer: No Typology Code available for payment source | Admitting: Pulmonary Disease

## 2016-05-12 ENCOUNTER — Ambulatory Visit (INDEPENDENT_AMBULATORY_CARE_PROVIDER_SITE_OTHER): Payer: Commercial Managed Care - HMO | Admitting: Physician Assistant

## 2016-05-12 ENCOUNTER — Telehealth: Payer: Self-pay | Admitting: Cardiology

## 2016-05-12 ENCOUNTER — Encounter: Payer: Self-pay | Admitting: Physician Assistant

## 2016-05-12 VITALS — BP 152/78 | HR 87 | Ht 73.83 in | Wt 194.6 lb

## 2016-05-12 DIAGNOSIS — R079 Chest pain, unspecified: Secondary | ICD-10-CM

## 2016-05-12 DIAGNOSIS — I10 Essential (primary) hypertension: Secondary | ICD-10-CM

## 2016-05-12 DIAGNOSIS — R0609 Other forms of dyspnea: Secondary | ICD-10-CM | POA: Diagnosis not present

## 2016-05-12 DIAGNOSIS — I4892 Unspecified atrial flutter: Secondary | ICD-10-CM | POA: Diagnosis not present

## 2016-05-12 DIAGNOSIS — R0600 Dyspnea, unspecified: Secondary | ICD-10-CM

## 2016-05-12 MED ORDER — SUCRALFATE 1 GM/10ML PO SUSP: 1.0000 g | Freq: Two times a day (BID) | ORAL | 0 refills | Status: DC

## 2016-05-12 MED ORDER — PANTOPRAZOLE SODIUM 40 MG PO TBEC: 40.0000 mg | DELAYED_RELEASE_TABLET | ORAL | 3 refills | Status: DC

## 2016-05-12 MED ORDER — GI COCKTAIL ~~LOC~~: 30.0000 mL | ORAL | 0 refills | Status: DC

## 2016-05-12 NOTE — Telephone Encounter (Signed)
New Message  Pt voiced wanting nurse to return his call.  Pt voiced no CP no SOB.  Please f/u with pt

## 2016-05-12 NOTE — Progress Notes (Signed)
Cardiology Office Note   Date:  05/12/2016   ID:  Timothy, Guerrero [birth year], MRN [id number]  PCP:  Kenn File, MD  Cardiologist:  Dr Percival Spanish 02/02/2016  [name], PA-C   Chief Complaint  Patient presents with  . Chest Pain  . Shortness of Breath  . Heartburn    History of Present Illness: Timothy Guerrero is a 75 y.o. male with a history of atrial flutter after lobectomy 2016 for [place] lung CA, HTN, tremor, DM, HLD, lung CA, prostate CA, OSA +/- CPAP. CAD w/ small RCA w/ >80% stenosis at [facility] 2013, neg stress test 2015, EF nl. He is on coumadin.   Timothy Guerrero presents for evaluation of chest pain, SOB and heartburn.   Within the last month, he has developed burning SSCP. He will get it after every meal and has it every night when he is getting ready to go to bed. He is taking a spoonful of unfiltered vinegar and Prilosec 20 mg qd without any relief. Tums will help after 15-20 minutes. No blood in bowel movements or black tarry stools.   He had been having some palpitations after being started on Trulicity. He was having problems with being light-headed, CBGs were up and down. His appetite was poor and he lost 35 lbs because he did not feel like eating. However, the side effects were significant, he is off that medication now.   His DOE has been worsening. After his lung surgery, he was doing well. The first of 2017, he felt very well. About 6 weeks ago, he noticed increasing DOE. He became unable to play golf, struggles to do housework and can barely slow dance. No LE edema. No true PND or orthopnea, but his chest pain blurs the issues because it keeps him up at night. No SOB at rest. His most recent long trip was a drive back from [place] during the eclipse. No immobilization, no ongoing rx for lung CA.    Past Medical History:  Diagnosis Date  . Arthritis   . Atrial flutter ([place])    post op following lobectomy  . Benign essential tremor   . Cataract     . Colon polyp    2007, polyp with early cancer, one year f/u no residual polyp. next TCS 2010, see PSH. [facility].  . Diabetes mellitus without complication ([place])    10 years  . HOH (hard of hearing)   . Hypercholesteremia   . Hypertension    10 years  . Lung cancer ([place])   . Prostate cancer ([place])   . Shortness of breath dyspnea    due to lung mass  . Skin melanoma ([place])   . Sleep apnea    wears CPAP sometimes, cannot tolerate all the time.    Past Surgical History:  Procedure Laterality Date  . bottom lobe of right lung removed  12/17/14  . CARDIAC CATHETERIZATION     no PCI  . CATARACT EXTRACTION Left   . CATARACT EXTRACTION W/PHACO Left 01/03/2016   Procedure: CATARACT EXTRACTION PHACO AND INTRAOCULAR LENS PLACEMENT LEFT EYE CDE=7.78;  Surgeon: Williams Che, MD;  Location: [facility];  Service: Ophthalmology;  Laterality: Left;  . CATARACT EXTRACTION W/PHACO Right 03/20/2016   Procedure: CATARACT EXTRACTION PHACO AND INTRAOCULAR LENS PLACEMENT; CDE:  9.58;  Surgeon: Williams Che, MD;  Location: [facility];  Service: Ophthalmology;  Laterality: Right;  . COLONOSCOPY  08/2008   Dr. Casimer Bilis  [place]: normal, internal grade 1 hemorrhoids. next TCS 08/2013  . COLONOSCOPY N/A 03/04/2014   Procedure: COLONOSCOPY;  Surgeon: Daneil Dolin, MD;  Location: [facility];  Service: Endoscopy;  Laterality: N/A;  7:30am  . EYE SURGERY    . knee replacements     bilateral. over 10 years ago  . LOBECTOMY Right 12/17/2014   Procedure: RIGHT LOWER LOBECTOMY;  Surgeon: Melrose Nakayama, MD;  Location: [facility];  Service: Thoracic;  Laterality: Right;  . LYMPH NODE DISSECTION Right 12/17/2014   Procedure: LYMPH NODE DISSECTION;  Surgeon: Melrose Nakayama, MD;  Location: [facility];  Service: Thoracic;  Laterality: Right;  . NECK SURGERY     melanoma removed.  [name] PROSTATE SURGERY     prostatectomy, radical  . TOTAL HIP ARTHROPLASTY  2007   left  . total  shoulder replacement  2011   left  . VIDEO ASSISTED THORACOSCOPY (VATS)/WEDGE RESECTION Right 12/17/2014   Procedure: RIGHT VIDEO ASSISTED THORACOSCOPY (VATS);  Surgeon: Melrose Nakayama, MD;  Location: [facility];  Service: Thoracic;  Laterality: Right;    Current Outpatient Prescriptions  Medication Sig Dispense Refill  . Ascorbic Acid (VITAMIN C PO) Take 1 tablet by mouth daily.    . cholecalciferol (VITAMIN D) 1000 units tablet Take 1,000 Units by mouth daily.    [name] glipiZIDE (GLUCOTROL) 10 MG tablet TAKE 1 TABLET EVERY DAY 90 tablet 0  . Insulin Glargine (LANTUS SOLOSTAR) 100 UNIT/ML Solostar Pen Inject 20-40 Units into the skin daily at 10 pm. 5 pen PRN  . levothyroxine (SYNTHROID, LEVOTHROID) 50 MCG tablet Take 1 tablet (50 mcg total) by mouth daily. 90 tablet 0  . lisinopril (PRINIVIL,ZESTRIL) 20 MG tablet Take 10 mg by mouth daily.    . metFORMIN (GLUCOPHAGE) 1000 MG tablet Take 1 tablet (1,000 mg total) by mouth 2 (two) times daily. 180 tablet 1  . metoprolol succinate (TOPROL-XL) 100 MG 24 hr tablet Take 100 mg by mouth daily.     [name] omeprazole (PRILOSEC) 20 MG capsule Take 1 capsule (20 mg total) by mouth daily. 30 capsule 5  . tiotropium (SPIRIVA) 18 MCG inhalation capsule Place 18 mcg into inhaler and inhale daily.    [name] warfarin (COUMADIN) 5 MG tablet TAKE 1 TABLET EVERY DAY 90 tablet 0   No current facility-administered medications for this visit.     Allergies:   Patient has no known allergies.    Social History:  The patient  reports that he quit smoking about 29 years ago. His smoking use included Cigarettes. He has a 64.00 pack-year smoking history. He has never used smokeless tobacco. He reports that he does not drink alcohol or use drugs.   Family History:  The patient's family history includes Breast cancer in his sister; COPD in his sister; Cancer in his mother; Cancer (age of onset: 19) in his sister; Prostate cancer in his son.    ROS:  Please see the history of  present illness. All other systems are reviewed and negative.    PHYSICAL EXAM: VS:  BP (!) 152/78   Pulse 87   Ht 6' 1.83" (1.875 m)   Wt 194 lb 9.6 oz (88.3 kg)   BMI 25.10 kg/m  , BMI Body mass index is 25.1 kg/m. GEN: Well nourished, well developed, male in no acute distress  HEENT: normal for age  Neck: no JVD, no carotid bruit, no masses Cardiac: RRR; no murmur, no rubs, or gallops Respiratory:  decreased BS bases, R>L, normal work of breathing GI: soft, nontender, nondistended, + BS MS: no deformity or atrophy; no edema; distal pulses are 2+ in all 4 extremities   Skin: warm and dry, no rash Neuro:  Strength and sensation are intact Psych: euthymic mood, full affect   EKG:  EKG is ordered today. The ekg ordered today demonstrates SR, HR 87, no acute ischemic changes   Recent Labs: 03/14/2016: BUN 18; Creatinine, Ser 0.81; Hemoglobin 9.2; Platelets 381; Potassium 3.6; Sodium 141    Lipid Panel    Component Value Date/Time   CHOL 191 05/13/2015 1133   TRIG 199 (H) 05/13/2015 1133   HDL 35 (L) 05/13/2015 1133   CHOLHDL 5.5 (H) 05/13/2015 1133   LDLCALC 116 (H) 05/13/2015 1133     Wt Readings from Last 3 Encounters:  05/12/16 194 lb 9.6 oz (88.3 kg)  04/24/16 196 lb 12.8 oz (89.3 kg)  04/11/16 183 lb 6.4 oz (83.2 kg)     Other studies Reviewed: Additional studies/ records that were reviewed today include: Office notes and testing.  ASSESSMENT AND PLAN:  1.  Chest pain: his symptoms are much more consistent with GI origin than cardiac. I am concerned that he has significant gastritis or a developing ulcer. Add Protonix bid, Carafate ac/hs and GI cocktail prn If these do not relieve sx, see primary MD and may need GI consult.  Follow back up with cardiology soon.  2. DOE: his last long car ride was 3 months ago. He is s/p R lobectomy for lung CA. Remote tobacco use.He is to see Dr Ashok Cordia next week. Will defer pulm eval to him, if sx felt cardiac, will be happy  to set pt up with a MV  3. Atrial flutter: he is having no sx reminiscent of atrial flutter.    Current medicines are reviewed at length with the patient today.  The patient does not have concerns regarding medicines.  The following changes have been made:  See above  Labs/ tests ordered today include:  No orders of the defined types were placed in this encounter.    Disposition:   FU with Dr Percival Spanish  Signed, [name], PA-C  05/12/2016 1:40 PM    [facility] Phone: [phone]; Fax: [phone]  This note was written with the assistance of speech recognition software. Please excuse any transcriptional errors.

## 2016-05-12 NOTE — Telephone Encounter (Signed)
Patient called to let Suanne Marker know that the Carafate suspension costs $171.00.  Patient said he can't afford that as the GI cocktail is $50 but he is getting that one. The pharmacist says it does not come in a generic.  He is going to check with the [facility] pharmacy on Monday.  Routing to [employer], PA as she saw him at office visit today.

## 2016-05-12 NOTE — Patient Instructions (Signed)
Medication Instructions:  1. START PROTONIX 40 MG 1 TABLET TWICE DAILY FOR 1 WEEK THEN DECREASE TO 40 MG ONCE A DAY  2. AN RX FOR A GI COCKTAIL HAS BEEN CALLED INTO [facility] [address] ; FOLLOW DIRECTION ON BOTTLE  3. START CARAFATE SUSPENSION WITH DIRECTIONS TO TAKE BEFORE BREAKFAST AND AT BEDTIME; FOLLOW DIRECTIONS ON THE BOTTLE   Labwork: NONE  Testing/Procedures: Your physician has requested that you have an echocardiogram. Echocardiography is a painless test that uses sound waves to create images of your heart. It provides your doctor with information about the size and shape of your heart and how well your heart's chambers and valves are working. This procedure takes approximately one hour. There are no restrictions for this procedure.   Follow-Up: DR. Percival Spanish FIRST AVAILABLE PER RHONDA BARRETT, PAC; HOWEVER IF NOT AVAILABLE IN 3 WEEKS THEN SCHEDULE F/OLLOW UP WITH RHONDA BARRETT, PAC  Any Other Special Instructions Will Be Listed Below (If Applicable).     If you need a refill on your cardiac medications before your next appointment, please call your pharmacy.

## 2016-05-15 ENCOUNTER — Encounter: Payer: Self-pay | Admitting: Pulmonary Disease

## 2016-05-15 ENCOUNTER — Telehealth: Payer: Self-pay | Admitting: Pulmonary Disease

## 2016-05-15 ENCOUNTER — Telehealth: Payer: Self-pay | Admitting: Family Medicine

## 2016-05-15 ENCOUNTER — Ambulatory Visit (INDEPENDENT_AMBULATORY_CARE_PROVIDER_SITE_OTHER): Payer: Commercial Managed Care - HMO | Admitting: Pulmonary Disease

## 2016-05-15 ENCOUNTER — Institutional Professional Consult (permissible substitution): Payer: No Typology Code available for payment source | Admitting: Pulmonary Disease

## 2016-05-15 ENCOUNTER — Other Ambulatory Visit (INDEPENDENT_AMBULATORY_CARE_PROVIDER_SITE_OTHER): Payer: No Typology Code available for payment source

## 2016-05-15 VITALS — BP 138/78 | HR 87 | Ht 72.0 in | Wt 196.0 lb

## 2016-05-15 DIAGNOSIS — IMO0001 Reserved for inherently not codable concepts without codable children: Secondary | ICD-10-CM

## 2016-05-15 DIAGNOSIS — J439 Emphysema, unspecified: Secondary | ICD-10-CM

## 2016-05-15 DIAGNOSIS — R911 Solitary pulmonary nodule: Secondary | ICD-10-CM

## 2016-05-15 DIAGNOSIS — D649 Anemia, unspecified: Secondary | ICD-10-CM

## 2016-05-15 DIAGNOSIS — R0602 Shortness of breath: Secondary | ICD-10-CM

## 2016-05-15 DIAGNOSIS — C3491 Malignant neoplasm of unspecified part of right bronchus or lung: Secondary | ICD-10-CM

## 2016-05-15 LAB — CBC WITH DIFFERENTIAL/PLATELET
Basophils Absolute: 0 10*3/uL (ref 0.0–0.1)
Basophils Relative: 0.3 % (ref 0.0–3.0)
Eosinophils Absolute: 0.1 10*3/uL (ref 0.0–0.7)
Eosinophils Relative: 1.6 % (ref 0.0–5.0)
HCT: 24.5 % — ABNORMAL LOW (ref 39.0–52.0)
Hemoglobin: 7.3 g/dL — CL (ref 13.0–17.0)
Lymphocytes Relative: 23.6 % (ref 12.0–46.0)
Lymphs Abs: 1.8 10*3/uL (ref 0.7–4.0)
MCHC: 29.6 g/dL — ABNORMAL LOW (ref 30.0–36.0)
MCV: 64.7 fl — ABNORMAL LOW (ref 78.0–100.0)
Monocytes Absolute: 1 10*3/uL (ref 0.1–1.0)
Monocytes Relative: 13.3 % — ABNORMAL HIGH (ref 3.0–12.0)
Neutro Abs: 4.6 10*3/uL (ref 1.4–7.7)
Neutrophils Relative %: 61.2 % (ref 43.0–77.0)
Platelets: 352 10*3/uL (ref 150.0–400.0)
RBC: 3.78 Mil/uL — ABNORMAL LOW (ref 4.22–5.81)
RDW: 18.3 % — ABNORMAL HIGH (ref 11.5–15.5)
WBC: 7.6 10*3/uL (ref 4.0–10.5)

## 2016-05-15 LAB — FERRITIN: Ferritin: 6.6 ng/mL — ABNORMAL LOW (ref 22.0–322.0)

## 2016-05-15 NOTE — Telephone Encounter (Signed)
Please review

## 2016-05-15 NOTE — Progress Notes (Signed)
Subjective:    Patient ID: Timothy Guerrero, male    DOB: 10-Jul-1940, 75 y.o.   MRN: [id number]  HPI The patient reports he was originally diagnosed with lung cancer at the [place]. He wanted to have his resection done by a [place] physician which led him to Dr. Roxan Hockey and ultimately led to his lobectomy in June 2016. He reports he has had shortness of breath for approximately 6 weeks or perhaps longer. He reports he has noticed dyspnea on exertion to the point where he cannot even walk back to his golf cart. He reports he has had a "stabbing" pain in his chest that has awoken him up in the middle of the night. Denies any morning brash water taste. He reports he has been using a "GI Cocktail" with some relief. He has been taking Protonix twice daily since Friday. He hasn't been able to afford the Carafate suspension he was prescribed. He reports he has had a chronic cough for approximately 1 year since his lobectomy. He reports his cough is nonproductive. He denies any wheezing. No fever, chills, or sweats. No odynophagia. He reports he does have rare dysphagia. He has to take breaks between putting the sheets on his bed. He has been on Spiriva for a couple of years. He reports he has only been using Spiriva on a daily basis over the last 6 months to help with his dyspnea. No h/o bronchitis or pneumonia.   Review of Systems No rashes or abnormal bruising. No adenopathy in his neck, groin, or axilla. A pertinent 14 point review of systems is negative except as per the history of presenting illness.  No Known Allergies  Current Outpatient Prescriptions on File Prior to Visit  Medication Sig Dispense Refill  . Alum & Mag Hydroxide-Simeth (GI COCKTAIL) SUSP suspension Take 30 mLs by mouth as directed. Follow directions on bottle.Marland KitchenMarland KitchenShake well. 240 mL 0  . glipiZIDE (GLUCOTROL) 10 MG tablet TAKE 1 TABLET EVERY DAY 90 tablet 0  . Insulin Glargine (LANTUS SOLOSTAR) 100 UNIT/ML Solostar Pen Inject 20-40 Units  into the skin daily at 10 pm. 5 pen PRN  . levothyroxine (SYNTHROID, LEVOTHROID) 50 MCG tablet Take 1 tablet (50 mcg total) by mouth daily. 90 tablet 0  . lisinopril (PRINIVIL,ZESTRIL) 20 MG tablet Take 10 mg by mouth daily.    . metFORMIN (GLUCOPHAGE) 1000 MG tablet Take 1 tablet (1,000 mg total) by mouth 2 (two) times daily. 180 tablet 1  . metoprolol succinate (TOPROL-XL) 100 MG 24 hr tablet Take 100 mg by mouth daily.     [name] omeprazole (PRILOSEC) 20 MG capsule Take 1 capsule (20 mg total) by mouth daily. 30 capsule 5  . pantoprazole (PROTONIX) 40 MG tablet Take 1 tablet (40 mg total) by mouth as directed. Take 40 mg twice daily for 1 week then decrease to 40 mg once a dady 45 tablet 3  . tiotropium (SPIRIVA) 18 MCG inhalation capsule Place 18 mcg into inhaler and inhale daily.    [name] warfarin (COUMADIN) 5 MG tablet TAKE 1 TABLET EVERY DAY 90 tablet 0  . sucralfate (CARAFATE) 1 GM/10ML suspension Take 10 mLs (1 g total) by mouth 2 (two) times daily. Take before breakfast and at bedtime (Patient not taking: Reported on 05/15/2016) 420 mL 0   No current facility-administered medications on file prior to visit.     Past Medical History:  Diagnosis Date  . Arthritis   . Atrial flutter ([place])    post op following lobectomy  .  Benign essential tremor   . Cataract   . Colon polyp    2007, polyp with early cancer, one year f/u no residual polyp. next TCS 2010, see PSH. [facility].  . Diabetes mellitus without complication ([place])    10 years  . HOH (hard of hearing)   . Hypercholesteremia   . Hypertension    10 years  . Lung cancer ([place])   . Prostate cancer ([place])   . Shortness of breath dyspnea    due to lung mass  . Skin melanoma ([place])   . Sleep apnea    wears CPAP sometimes, cannot tolerate all the time.    Past Surgical History:  Procedure Laterality Date  . bottom lobe of right lung removed  12/17/14  . CARDIAC CATHETERIZATION     no PCI  . CATARACT EXTRACTION Left     . CATARACT EXTRACTION W/PHACO Left 01/03/2016   Procedure: CATARACT EXTRACTION PHACO AND INTRAOCULAR LENS PLACEMENT LEFT EYE CDE=7.78;  Surgeon: Williams Che, MD;  Location: [facility];  Service: Ophthalmology;  Laterality: Left;  . CATARACT EXTRACTION W/PHACO Right 03/20/2016   Procedure: CATARACT EXTRACTION PHACO AND INTRAOCULAR LENS PLACEMENT; CDE:  9.58;  Surgeon: Williams Che, MD;  Location: [facility];  Service: Ophthalmology;  Laterality: Right;  . COLONOSCOPY  08/2008   Dr. Leafy Half, [place]: normal, internal grade 1 hemorrhoids. next TCS 08/2013  . COLONOSCOPY N/A 03/04/2014   Procedure: COLONOSCOPY;  Surgeon: Daneil Dolin, MD;  Location: [facility];  Service: Endoscopy;  Laterality: N/A;  7:30am  . EYE SURGERY    . knee replacements     bilateral. over 10 years ago  . LOBECTOMY Right 12/17/2014   Procedure: RIGHT LOWER LOBECTOMY;  Surgeon: Melrose Nakayama, MD;  Location: [facility];  Service: Thoracic;  Laterality: Right;  . LYMPH NODE DISSECTION Right 12/17/2014   Procedure: LYMPH NODE DISSECTION;  Surgeon: Melrose Nakayama, MD;  Location: [facility];  Service: Thoracic;  Laterality: Right;  . NECK SURGERY     melanoma removed.  [name] PROSTATE SURGERY     prostatectomy, radical  . TOTAL HIP ARTHROPLASTY  2007   left  . total shoulder replacement  2011   left  . VIDEO ASSISTED THORACOSCOPY (VATS)/WEDGE RESECTION Right 12/17/2014   Procedure: RIGHT VIDEO ASSISTED THORACOSCOPY (VATS);  Surgeon: Melrose Nakayama, MD;  Location: [place];  Service: Thoracic;  Laterality: Right;    Family History  Problem Relation Age of Onset  . Cancer Mother     unknown type  . COPD Sister   . Breast cancer Sister   . Cancer Sister 82    breast  . Prostate cancer Son     died age 29  . Heart attack Daughter   . Colon cancer Neg Hx     Social History   Social History  . Marital status: Divorced    Spouse name: N/A  . Number of children: 3  . Years of education:  N/A   Occupational History  . retired    Social History Main Topics  . Smoking status: Former Smoker    Packs/day: 2.00    Years: 32.00    Types: Cigarettes    Quit date: 07/03/1986  . Smokeless tobacco: Never Used  . Alcohol use No  . Drug use: No  . Sexual activity: Yes   Other Topics Concern  . None   Social History Engineer, materials.  Lives alone.      [facility]:   Originally from [place]. Served in [employer] with significant exposure to asbestos. 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 He has also lived in [place], [place], & [place]. He has lived in [place] since 1979. 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 No pets currently. Remote bird exposure.       Objective:   Physical Exam BP 138/78 (BP Location: Left Arm, Cuff Size: Normal)   Pulse 87   Ht 6' (1.829 m)   Wt 196 lb (88.9 kg)   SpO2 98%   BMI 26.58 kg/m  General:  Awake. Alert. No acute distress.  Integument:  Warm & dry. No rash on exposed skin. No bruising. Lymphatics:  No appreciated cervical or supraclavicular lymphadenoapthy. HEENT:  Moist mucus membranes. No oral ulcers. No scleral injection or icterus.  Cardiovascular:  Regular rate. No edema. No appreciable JVD.  Pulmonary:  Good aeration & clear to auscultation bilaterally. Symmetric chest wall expansion. No accessory muscle use. Abdomen: Soft. Normal bowel sounds. Nondistended. Grossly nontender. Musculoskeletal:  Normal bulk and tone. Hand grip strength 5/5 bilaterally. No joint deformity or effusion appreciated. Neurological:  CN 2-12 grossly in tact. No meningismus. Moving all 4 extremities equally. Symmetric brachioradialis deep tendon reflexes. Psychiatric:  Mood and affect congruent. Speech normal rhythm, rate & tone.   IMAGING CT CHEST W/ 12/21/15 (personally reviewed by me): No pericardial effusion. Bilateral calcified pleural plaques. Trace right pleural effusion. Status post right lower lobectomy. Centrilobular and paraseptal emphysema that is apical  predominant. 2 mm left upper lobe nodule stable since 2012. 3 mm left lower lobe nodule was not present on previous CT imaging. 1.6 cm right adrenal nodule consistent with imaging from 2012.  PATHOLOGY Right Lower Lobectomy (12/17/14):  Right Pleural Biposy w/o malignancy but calcification present / 2.8 cm Invasive Poorly Differentiated Squamous Cell Carcinoma / Lymph Nodes Negative   LABS 03/14/16 CBC:  9.5/9.2/30.6/381    Assessment & Plan:  75 y.o. male s/p right lower lobectomy with Squamous Cell Lung Cancer in June 2016.Patient's serum workup shows a new anemia and he is on chronic systemic anticoagulation with Coumadin. His dyspnea is likely multifactorial with his underlying emphysema and status post right lower lobectomy. Certainly it's highly probable that he has COPD along with his emphysema but this will need to be assessed with pulmonary function testing before further adjusting his inhaler medications. I instructed the patient to contact my office if he had any new breathing problems or questions before his next appointment.  1. Dyspnea:  Likely multifactorial. Checking for pulmonary function testing before next appointment as well as 6 minute walk test on room air. 2. Emphysema:  Screening for alpha-1 antitrypsin deficiency. Continuing Spiriva. Checking for pulmonary function testing before next appointment. 3. Right Lower Lobe Squamous Cell Carcinoma:  Stage pT1b, pN0 (Stage IA).No evidence of recurrence on CT imaging. Status post right lower lobectomy June 2016. 4. Left Lower Lobe Nodule:  Repeat CT chest without contrast before next appointment. 5. Anemia:  Checking CBC as well as serum ferritin. 6. Health Maintenance:  S/P Influenza Vaccine October 2017, Prevnar January 2017, & Tdap October 2017.  7. Follow-up: Return to clinic in 4 weeks or sooner if needed.  Sonia Baller Ashok Cordia, M.D. [facility] Pager:  [phone] After 3pm or if no response, call  [phone] 3:42 PM 05/15/16

## 2016-05-15 NOTE — Telephone Encounter (Signed)
Please let him know I had no idea Carafate cost that much. Hope the combination of Protonix and GI cocktail is helping his symptoms. If symptoms have not improved, early f/u with me.  If symptoms are improved, but he needs more help, f/u with primary MD as there may be more helpful GI meds out there that his primary knows about, or he may need referral to GI. Thanks

## 2016-05-15 NOTE — Patient Instructions (Addendum)
   Call me if you have any questions or new breathing problems before your next appointment.  I will see you back in 4 weeks to review your test results.  TESTS ORDERED: 1. Serum CBC w/ differential, Ferritin, & Alpha-1 Antitrypsin Phenotype 2. Full PFTs before follow-up 3. 6MWT on room air before follow-up 4. CT Chest W/O Contrast Before Follow-up

## 2016-05-15 NOTE — Telephone Encounter (Signed)
Tonya from [facility] called with a critical HGB value at 7.3 g/dL.   Dr. Ashok Cordia please advise. Thanks.

## 2016-05-15 NOTE — Telephone Encounter (Signed)
lmtcb X1 for pt to make aware of below recs.  Labs faxed to PCP

## 2016-05-15 NOTE — Telephone Encounter (Signed)
Ok. Please have him contact his PCP to address his worsening anemia and fax this result to their office. Thanks.

## 2016-05-15 NOTE — Telephone Encounter (Signed)
I personally spoke with the patient to relay the results of his blood work today which shows worsening anemia and a low ferritin level suggestive of blood loss anemia. Patient denies any near syncope. He denies any melena or hematochezia. He is currently on twice daily Protonix. Reports he has had colonoscopies in the past but has never had an upper endoscopy. I instructed the patient to contact his primary care physician first thing in the morning to discuss his worsening anemia and potential need for an upper endoscopy as soon as possible as well as possibly even a blood transfusion. I instructed the patient to seek immediate medical attention if he develops any near syncope or notices any blood loss or worsening of his breathing. He acknowledged understanding.

## 2016-05-16 ENCOUNTER — Encounter (HOSPITAL_COMMUNITY)
Admission: RE | Admit: 2016-05-16 | Discharge: 2016-05-16 | Disposition: A | Discharge status: Home / Self Care | Payer: Commercial Managed Care - HMO | Source: Ambulatory Visit | Attending: Family Medicine | Admitting: Family Medicine

## 2016-05-16 ENCOUNTER — Encounter (HOSPITAL_COMMUNITY): Payer: Self-pay

## 2016-05-16 ENCOUNTER — Telehealth: Payer: Self-pay | Admitting: Family Medicine

## 2016-05-16 ENCOUNTER — Telehealth: Payer: Self-pay | Admitting: *Deleted

## 2016-05-16 DIAGNOSIS — I2510 Atherosclerotic heart disease of native coronary artery without angina pectoris: Secondary | ICD-10-CM | POA: Insufficient documentation

## 2016-05-16 DIAGNOSIS — D649 Anemia, unspecified: Secondary | ICD-10-CM | POA: Diagnosis not present

## 2016-05-16 DIAGNOSIS — E119 Type 2 diabetes mellitus without complications: Secondary | ICD-10-CM | POA: Diagnosis not present

## 2016-05-16 LAB — POCT HEMOGLOBIN-HEMACUE: Hemoglobin: 6.6 g/dL — CL (ref 13.0–17.0)

## 2016-05-16 LAB — PREPARE RBC (CROSSMATCH)

## 2016-05-16 LAB — ABO/RH: ABO/RH(D): O POS

## 2016-05-16 NOTE — Telephone Encounter (Signed)
-----   Message from [name], MD sent at 05/16/2016  2:48 PM EST ----- He has a decrease in hemoglobin overnight.  He is getting a trsansfusion tonight Will you call and make sure he doesn't have any signs of active blled, low threshold for ED visit if so.  Thanks !sam

## 2016-05-16 NOTE — Telephone Encounter (Signed)
Called pt with results Stressed if any increased SOB or bleeding go immediately to ED Pt verbalizes understanding

## 2016-05-16 NOTE — Progress Notes (Signed)
Results for Timothy Guerrero, Timothy Guerrero (MRN [id number]) as of 05/16/2016 13:26  Will transfuse 2 units in the am with a repeat CBC 1 hour post transfusion. Patient educated with literature .   Ref. Range 05/16/2016 12:53  Hemoglobin Latest Ref Range: 13.0 - 17.0 g/dL 6.6 (LL)

## 2016-05-16 NOTE — Telephone Encounter (Signed)
I had not been informed yet,   Transfusion threshold for CAD is 8.0, I agree he needs to be transfused.   Stop coumadin.   Will ask our staff to arrange PRBC X 2 units.   Follow CBC afterward, try to f/u in clinic later this week.   [name], MD [facility] 05/16/2016, 10:31 AM

## 2016-05-16 NOTE — Discharge Instructions (Signed)
Blood Transfusion A blood transfusion is a procedure in which you are given blood through an IV tube. You may need this procedure because of:  Illness.  Surgery.  Injury.  The blood may come from someone else (a donor). You may also be able to donate blood for yourself (autologous blood donation). The blood given in a transfusion is made up of different types of cells. You may get:  Red blood cells. These carry oxygen to the cells in the body.  White blood cells. These help you fight infections.  Platelets. These help your blood to clot.  Plasma. This is the liquid part of your blood. It helps with fluid imbalances.  If you have a clotting disorder, you may also get other types of blood products. What happens before the procedure?  You will have a blood test to find out your blood type. The test also finds out what type of blood your body will accept and matches it to the donor type.  If you are going to have a planned surgery, you may be able to donate your own blood. This may be done in case you need a transfusion.  If you have had an allergic reaction to a transfusion in the past, you may be given medicine to help prevent a reaction. This medicine may be given to you by mouth or through an IV.  You will have your temperature, blood pressure, and pulse checked.  Follow instructions from your doctor about what you cannot eat or drink.  Ask your doctor about: ? Changing or stopping your regular medicines. This is important if you take diabetes medicines or blood thinners. ? Taking medicines such as aspirin and ibuprofen. These medicines can thin your blood. Do not take these medicines before your procedure if your doctor tells you not to. What happens during the procedure?  An IV tube will be put into one of your veins.  The bag of donated blood will be attached to your IV tube. Then, the blood will enter through your vein.  Your temperature, blood pressure, and pulse will be  checked regularly during the procedure. This is done to find early signs of a transfusion reaction.  If you have any signs or symptoms of a reaction, your transfusion will be stopped. You may also be given medicine.  When the transfusion is done, your IV tube will be taken out.  Pressure may be applied to the IV site for a few minutes.  A bandage (dressing) will be put on the IV site. The procedure may vary among doctors and hospitals. What happens after the procedure?  Your temperature, blood pressure, heart rate, breathing rate, and blood oxygen level will be checked often.  Your blood may be tested to see how you are responding to the transfusion.  You may be warmed with fluids or blankets. This is done to keep the temperature of your body normal. Summary  A blood transfusion is a procedure in which you are given blood through an IV tube.  The blood may come from someone else (a donor). You may also be able to donate blood for yourself.  If you have had an allergic reaction to a transfusion in the past, you may be given medicine to help prevent a reaction. This medicine may be given to you by mouth or through an IV tube.  Your temperature, blood pressure, heart rate, breathing rate, and blood oxygen level will be checked often.  Your blood may be tested to   see how you are responding to the transfusion. This information is not intended to replace advice given to you by your health care provider. Make sure you discuss any questions you have with your health care provider. Document Released: 09/15/2008 Document Revised: 02/11/2016 Document Reviewed: 02/11/2016 Elsevier Interactive Patient Education  2017 Elsevier Inc.  

## 2016-05-16 NOTE — Telephone Encounter (Signed)
Patient called stating that he has seen his pulmonologist Dr. Blanchard Mane and blood work has dropped and is concerned that he is losing blood and believes he now needs a blood transfusion.  Patient states that Dr. Milinda Hirschfeld was to contact you about this issue.

## 2016-05-16 NOTE — Telephone Encounter (Signed)
Called patient. Notes the GI cocktail has helped. He will continue this. He's aware to discuss w PCP if he needs further suggestions.  Pt mentions he saw pulmonologist who noted his blood count is low. Notes they mentioned he may need blood transfusion in the future. He states this will be followed closely by pulmonology and/or PCP. States he things this is explaining the fatigue he was having.  Pt had no further questions or concerns at this time.

## 2016-05-16 NOTE — Telephone Encounter (Signed)
Pt aware to stop coumadin; Also aware of appointment dates/times for blood transfusion [facility] 1pm 05/16/16 for type and cross [facility]  7am for transfusion

## 2016-05-17 ENCOUNTER — Telehealth: Payer: Self-pay | Admitting: Pulmonary Disease

## 2016-05-17 ENCOUNTER — Ambulatory Visit (HOSPITAL_COMMUNITY)
Admission: RE | Admit: 2016-05-17 | Discharge: 2016-05-17 | Disposition: A | Discharge status: Home / Self Care | Payer: Commercial Managed Care - HMO | Source: Ambulatory Visit | Attending: Pulmonary Disease | Admitting: Pulmonary Disease

## 2016-05-17 ENCOUNTER — Encounter (HOSPITAL_COMMUNITY)
Admission: RE | Admit: 2016-05-17 | Discharge: 2016-05-17 | Disposition: A | Discharge status: Home / Self Care | Payer: Commercial Managed Care - HMO | Source: Ambulatory Visit | Attending: Family Medicine | Admitting: Family Medicine

## 2016-05-17 DIAGNOSIS — J439 Emphysema, unspecified: Secondary | ICD-10-CM | POA: Insufficient documentation

## 2016-05-17 DIAGNOSIS — R0602 Shortness of breath: Secondary | ICD-10-CM | POA: Insufficient documentation

## 2016-05-17 DIAGNOSIS — I2510 Atherosclerotic heart disease of native coronary artery without angina pectoris: Secondary | ICD-10-CM | POA: Diagnosis not present

## 2016-05-17 DIAGNOSIS — D649 Anemia, unspecified: Secondary | ICD-10-CM | POA: Diagnosis not present

## 2016-05-17 DIAGNOSIS — R942 Abnormal results of pulmonary function studies: Secondary | ICD-10-CM | POA: Diagnosis not present

## 2016-05-17 DIAGNOSIS — E119 Type 2 diabetes mellitus without complications: Secondary | ICD-10-CM | POA: Diagnosis not present

## 2016-05-17 LAB — CBC
HCT: 30.7 % — ABNORMAL LOW (ref 39.0–52.0)
Hemoglobin: 8.8 g/dL — ABNORMAL LOW (ref 13.0–17.0)
MCH: 20.8 pg — ABNORMAL LOW (ref 26.0–34.0)
MCHC: 28.7 g/dL — ABNORMAL LOW (ref 30.0–36.0)
MCV: 72.4 fL — ABNORMAL LOW (ref 78.0–100.0)
Platelets: 258 10*3/uL (ref 150–400)
RBC: 4.24 MIL/uL (ref 4.22–5.81)
RDW: 18.2 % — ABNORMAL HIGH (ref 11.5–15.5)
WBC: 6.3 10*3/uL (ref 4.0–10.5)

## 2016-05-17 MED ORDER — SODIUM CHLORIDE 0.9 % IV SOLN: Freq: Once | INTRAVENOUS | Status: DC

## 2016-05-17 MED ORDER — ALBUTEROL SULFATE (2.5 MG/3ML) 0.083% IN NEBU
2.5000 mg | INHALATION_SOLUTION | Freq: Once | RESPIRATORY_TRACT | Status: AC
  Administered 2016-05-17: 2.5 mg via RESPIRATORY_TRACT

## 2016-05-18 ENCOUNTER — Ambulatory Visit (INDEPENDENT_AMBULATORY_CARE_PROVIDER_SITE_OTHER): Payer: Commercial Managed Care - HMO | Admitting: Family Medicine

## 2016-05-18 ENCOUNTER — Encounter: Payer: Self-pay | Admitting: Family Medicine

## 2016-05-18 VITALS — BP 164/82 | HR 85 | Temp 97.6°F | Ht 73.0 in | Wt 197.6 lb

## 2016-05-18 DIAGNOSIS — D62 Acute posthemorrhagic anemia: Secondary | ICD-10-CM

## 2016-05-18 LAB — TYPE AND SCREEN
ABO/RH(D): O POS
Antibody Screen: NEGATIVE
Unit division: 0
Unit division: 0

## 2016-05-18 NOTE — Telephone Encounter (Signed)
Pt aware that there are no plans for a bronch. Pt agrees with this plan. Nothing further needed.

## 2016-05-18 NOTE — Telephone Encounter (Signed)
Pt calling stating that he never received call from nurse on yesterday and can be reached @ [phone].Timothy Guerrero

## 2016-05-18 NOTE — Progress Notes (Signed)
Results for GUTHRIE, LEMME (MRN [id number]) as of 05/18/2016 11:59  Ref. Range 05/17/2016 11:19  WBC Latest Ref Range: 4.0 - 10.5 K/uL 6.3  RBC Latest Ref Range: 4.22 - 5.81 MIL/uL 4.24  Hemoglobin Latest Ref Range: 13.0 - 17.0 g/dL 8.8 (L)  HCT Latest Ref Range: 39.0 - 52.0 % 30.7 (L)  MCV Latest Ref Range: 78.0 - 100.0 fL 72.4 (L)  MCH Latest Ref Range: 26.0 - 34.0 pg 20.8 (L)  MCHC Latest Ref Range: 30.0 - 36.0 g/dL 28.7 (L)  RDW Latest Ref Range: 11.5 - 15.5 % 18.2 (H)  Platelets Latest Ref Range: 150 - 400 K/uL 258

## 2016-05-18 NOTE — Telephone Encounter (Signed)
Not sure what he is referring to either. I have no plans for a bronchoscopy.

## 2016-05-18 NOTE — Progress Notes (Signed)
   HPI  Patient presents today for follow-up from acute anemia.  Patient had acute anemia been anticoagulated on Coumadin.  He was transfused 2 units 2 days ago and feels much better. He had shortness of breath, weakness and fatigue.  He has a history of right coronary artery atherosclerosis. He denies any chest pain or racing heart today.  He denies any nosebleeds, vomiting, hematochezia, or melena.  PMH: Smoking status noted ROS: Per HPI  Objective: BP (!) 164/82   Pulse 85   Temp 97.6 F (36.4 C) (Oral)   Ht '6\' 1"'$  (1.854 m)   Wt 197 lb 9.6 oz (89.6 kg)   BMI 26.07 kg/m  Gen: NAD, alert, cooperative with exam HEENT: NCAT CV: RRR, good S1/S2, no murmur Resp: CTABL, no wheezes, non-labored Ext: No edema, warm Neuro: Alert and oriented, No gross deficits  Assessment and plan:  # Acute blood loss anemia Status post transfusion 2 units, rechecking CBC today Symptomatically doing very well. No clear source of bleeding, however likely GI source. Stop Coumadin F/u 2 weeks, likely start 81 mg asa if stable then.     [name], MD [facility] 05/18/2016, 2:42 PM

## 2016-05-18 NOTE — Telephone Encounter (Signed)
LMTCB

## 2016-05-18 NOTE — Telephone Encounter (Signed)
Spoke with the pt  He states that he was thinking that we were going to schedule a test "where they go down my throat"  I do not see that this was mentioned  JN- please advise thanks

## 2016-05-19 LAB — CBC WITH DIFFERENTIAL/PLATELET
Basophils Absolute: 0.1 10*3/uL (ref 0.0–0.2)
Basos: 1 %
EOS (ABSOLUTE): 0.2 10*3/uL (ref 0.0–0.4)
Eos: 3 %
Hematocrit: 30.5 % — ABNORMAL LOW (ref 37.5–51.0)
Hemoglobin: 8.5 g/dL — CL (ref 12.6–17.7)
Immature Grans (Abs): 0 10*3/uL (ref 0.0–0.1)
Immature Granulocytes: 0 %
Lymphocytes Absolute: 1.5 10*3/uL (ref 0.7–3.1)
Lymphs: 21 %
MCH: 19.6 pg — ABNORMAL LOW (ref 26.6–33.0)
MCHC: 27.9 g/dL — ABNORMAL LOW (ref 31.5–35.7)
MCV: 70 fL — ABNORMAL LOW (ref 79–97)
Monocytes Absolute: 0.7 10*3/uL (ref 0.1–0.9)
Monocytes: 10 %
Neutrophils Absolute: 4.6 10*3/uL (ref 1.4–7.0)
Neutrophils: 65 %
Platelets: 321 10*3/uL (ref 150–379)
RBC: 4.34 x10E6/uL (ref 4.14–5.80)
RDW: 19.6 % — ABNORMAL HIGH (ref 12.3–15.4)
WBC: 7 10*3/uL (ref 3.4–10.8)

## 2016-05-21 LAB — PULMONARY FUNCTION TEST
DL/VA % pred: 59 %
DL/VA: 2.84 ml/min/mmHg/L
DLCO unc % pred: 43 %
DLCO unc: 15.81 ml/min/mmHg
FEF 25-75 Post: 1.46 L/sec
FEF 25-75 Pre: 1.05 L/sec
FEF2575-%Change-Post: 38 %
FEF2575-%Pred-Post: 58 %
FEF2575-%Pred-Pre: 41 %
FEV1-%Change-Post: 9 %
FEV1-%Pred-Post: 66 %
FEV1-%Pred-Pre: 61 %
FEV1-Post: 2.31 L
FEV1-Pre: 2.12 L
FEV1FVC-%Change-Post: -2 %
FEV1FVC-%Pred-Pre: 83 %
FEV6-%Change-Post: 12 %
FEV6-%Pred-Post: 86 %
FEV6-%Pred-Pre: 76 %
FEV6-Post: 3.87 L
FEV6-Pre: 3.44 L
FEV6FVC-%Change-Post: 0 %
FEV6FVC-%Pred-Post: 105 %
FEV6FVC-%Pred-Pre: 105 %
FVC-%Change-Post: 12 %
FVC-%Pred-Post: 81 %
FVC-%Pred-Pre: 73 %
FVC-Post: 3.89 L
FVC-Pre: 3.47 L
Post FEV1/FVC ratio: 59 %
Post FEV6/FVC ratio: 99 %
Pre FEV1/FVC ratio: 61 %
Pre FEV6/FVC Ratio: 99 %
RV % pred: 70 %
RV: 1.92 L
TLC % pred: 74 %
TLC: 5.67 L

## 2016-05-22 ENCOUNTER — Encounter: Payer: Self-pay | Admitting: Pharmacist

## 2016-05-22 LAB — ALPHA-1 ANTITRYPSIN PHENOTYPE: A-1 Antitrypsin: 163 mg/dL (ref 83–199)

## 2016-05-29 ENCOUNTER — Encounter: Payer: Self-pay | Admitting: Family Medicine

## 2016-05-29 ENCOUNTER — Ambulatory Visit (INDEPENDENT_AMBULATORY_CARE_PROVIDER_SITE_OTHER): Payer: Commercial Managed Care - HMO | Admitting: Family Medicine

## 2016-05-29 VITALS — BP 128/65 | HR 87 | Temp 98.0°F | Ht 73.0 in | Wt 196.4 lb

## 2016-05-29 DIAGNOSIS — M79671 Pain in right foot: Secondary | ICD-10-CM

## 2016-05-29 DIAGNOSIS — I4892 Unspecified atrial flutter: Secondary | ICD-10-CM | POA: Diagnosis not present

## 2016-05-29 DIAGNOSIS — D62 Acute posthemorrhagic anemia: Secondary | ICD-10-CM

## 2016-05-29 DIAGNOSIS — E118 Type 2 diabetes mellitus with unspecified complications: Secondary | ICD-10-CM | POA: Diagnosis not present

## 2016-05-29 LAB — BAYER DCA HB A1C WAIVED: HB A1C (BAYER DCA - WAIVED): 7.3 % — ABNORMAL HIGH (ref <7.0)

## 2016-05-29 MED ORDER — FERROUS SULFATE 325 (65 FE) MG PO TABS: 325.0000 mg | ORAL_TABLET | Freq: Two times a day (BID) | ORAL | 2 refills | Status: DC

## 2016-05-29 NOTE — Patient Instructions (Signed)
Great to see you!  We will call with labs within 1 week, we will start a daily aspirin if your blood counts are stable.   Start iron twice daily for 3 months, you may need to take a stool softener as well.

## 2016-05-29 NOTE — Progress Notes (Signed)
   HPI  Patient presents today to follow-up for acute blood loss anemia, right foot pain, atrial flutter and diabetes.  Diabetes Fasting blood sugar 120-150, no hypoglycemia, no numbness or tingling of the feet.  Patient with pain in the right midfoot and intermittent swelling. No history of injury Pain is intermittent, not severe. No aggravating or alleviating factors. As been checked by podiatry at the [facility] who did not recommend any treatments  Atrial flutter, acute loss anemia Patient previously treated with Coumadin, had severe and sudden decrease in hemoglobin. There is no clear source of bleeding. Coumadin has been stopped, he was treated with 2 units of packed red blood cells Patient feels subjectively much better Denies any headaches or weakness  PMH: Smoking status noted ROS: Per HPI  Objective: BP 128/65   Pulse 87   Temp 98 F (36.7 C) (Oral)   Ht '6\' 1"'$  (1.854 m)   Wt 196 lb 6.4 oz (89.1 kg)   BMI 25.91 kg/m  Gen: NAD, alert, cooperative with exam HEENT: NCAT CV: RRR, good S1/S2, no murmur Resp: CTABL, no wheezes, non-labored Ext: No edema, warm Neuro: Alert and oriented, No gross deficits  MSK Normal-appearing feet bilaterally, no gross deformity No tenderness to palpation of metatarsals of the right foot, no pain with inversion/eversion, flexion, or extension of the foot.  Assessment and plan:  # Type 2 diabetes Control slipping somewhat, no additional medications for now Continue glipizide and metformin Recheck in 3 months  # Acute blood loss anemia Subjectively improving, even compared to the day after his 2 units of packed red cells No clear sources of bleeding Rechecking labs Low ferritin and microcytic, start 3 months of ID iron  # Right foot pain Unclear etiology, mild Offered sports medicine referral to consider orthotics, he will consider  # Atrial flutter Chads2Vasc score is 4, Although now has had severe sudden decrease in hemoglobin  requiring transfusion Given occult bleed will dc anticoagulation and continue daily asa to start after f/u labs are returned.     Orders Placed This Encounter  Procedures  . Bayer DCA Hb A1c Waived  . CBC with Differential/Platelet    Meds ordered this encounter  Medications  . ferrous sulfate 325 (65 FE) MG tablet    Sig: Take 1 tablet (325 mg total) by mouth 2 (two) times daily with a meal.    Dispense:  60 tablet    Refill:  2    [name], MD [facility] 05/29/2016, 4:36 PM

## 2016-05-30 ENCOUNTER — Other Ambulatory Visit: Payer: Self-pay

## 2016-05-30 ENCOUNTER — Ambulatory Visit: Payer: Commercial Managed Care - HMO | Admitting: Family Medicine

## 2016-05-30 DIAGNOSIS — D1801 Hemangioma of skin and subcutaneous tissue: Secondary | ICD-10-CM | POA: Diagnosis not present

## 2016-05-30 DIAGNOSIS — L821 Other seborrheic keratosis: Secondary | ICD-10-CM | POA: Diagnosis not present

## 2016-05-30 DIAGNOSIS — D649 Anemia, unspecified: Secondary | ICD-10-CM

## 2016-05-30 DIAGNOSIS — L570 Actinic keratosis: Secondary | ICD-10-CM | POA: Diagnosis not present

## 2016-05-30 DIAGNOSIS — D2261 Melanocytic nevi of right upper limb, including shoulder: Secondary | ICD-10-CM | POA: Diagnosis not present

## 2016-05-30 DIAGNOSIS — Z85820 Personal history of malignant melanoma of skin: Secondary | ICD-10-CM | POA: Diagnosis not present

## 2016-05-30 DIAGNOSIS — Z85828 Personal history of other malignant neoplasm of skin: Secondary | ICD-10-CM | POA: Diagnosis not present

## 2016-05-30 DIAGNOSIS — L820 Inflamed seborrheic keratosis: Secondary | ICD-10-CM | POA: Diagnosis not present

## 2016-05-30 DIAGNOSIS — L814 Other melanin hyperpigmentation: Secondary | ICD-10-CM | POA: Diagnosis not present

## 2016-05-30 LAB — CBC WITH DIFFERENTIAL/PLATELET
Basophils Absolute: 0.1 10*3/uL (ref 0.0–0.2)
Basos: 1 %
EOS (ABSOLUTE): 0.2 10*3/uL (ref 0.0–0.4)
Eos: 4 %
Hematocrit: 30.6 % — ABNORMAL LOW (ref 37.5–51.0)
Hemoglobin: 8.4 g/dL — CL (ref 12.6–17.7)
Immature Grans (Abs): 0 10*3/uL (ref 0.0–0.1)
Immature Granulocytes: 0 %
Lymphocytes Absolute: 1.2 10*3/uL (ref 0.7–3.1)
Lymphs: 17 %
MCH: 19.6 pg — ABNORMAL LOW (ref 26.6–33.0)
MCHC: 27.5 g/dL — ABNORMAL LOW (ref 31.5–35.7)
MCV: 71 fL — ABNORMAL LOW (ref 79–97)
Monocytes Absolute: 0.7 10*3/uL (ref 0.1–0.9)
Monocytes: 10 %
Neutrophils Absolute: 4.6 10*3/uL (ref 1.4–7.0)
Neutrophils: 68 %
Platelets: 337 10*3/uL (ref 150–379)
RBC: 4.29 x10E6/uL (ref 4.14–5.80)
RDW: 21.4 % — ABNORMAL HIGH (ref 12.3–15.4)
WBC: 6.8 10*3/uL (ref 3.4–10.8)

## 2016-05-31 ENCOUNTER — Other Ambulatory Visit: Payer: Self-pay | Admitting: Family Medicine

## 2016-06-06 ENCOUNTER — Ambulatory Visit (HOSPITAL_COMMUNITY): Payer: Commercial Managed Care - HMO | Attending: Cardiology

## 2016-06-06 ENCOUNTER — Other Ambulatory Visit: Payer: Self-pay

## 2016-06-06 DIAGNOSIS — Z8249 Family history of ischemic heart disease and other diseases of the circulatory system: Secondary | ICD-10-CM | POA: Diagnosis not present

## 2016-06-06 DIAGNOSIS — E785 Hyperlipidemia, unspecified: Secondary | ICD-10-CM | POA: Diagnosis not present

## 2016-06-06 DIAGNOSIS — I10 Essential (primary) hypertension: Secondary | ICD-10-CM

## 2016-06-06 DIAGNOSIS — G4733 Obstructive sleep apnea (adult) (pediatric): Secondary | ICD-10-CM | POA: Diagnosis not present

## 2016-06-06 DIAGNOSIS — I4892 Unspecified atrial flutter: Secondary | ICD-10-CM | POA: Diagnosis not present

## 2016-06-06 DIAGNOSIS — C3490 Malignant neoplasm of unspecified part of unspecified bronchus or lung: Secondary | ICD-10-CM | POA: Insufficient documentation

## 2016-06-06 DIAGNOSIS — I119 Hypertensive heart disease without heart failure: Secondary | ICD-10-CM | POA: Diagnosis not present

## 2016-06-06 DIAGNOSIS — Z87891 Personal history of nicotine dependence: Secondary | ICD-10-CM | POA: Diagnosis not present

## 2016-06-06 DIAGNOSIS — I340 Nonrheumatic mitral (valve) insufficiency: Secondary | ICD-10-CM | POA: Insufficient documentation

## 2016-06-06 DIAGNOSIS — E119 Type 2 diabetes mellitus without complications: Secondary | ICD-10-CM | POA: Diagnosis not present

## 2016-06-07 ENCOUNTER — Ambulatory Visit (HOSPITAL_COMMUNITY): Payer: No Typology Code available for payment source

## 2016-06-07 ENCOUNTER — Telehealth: Payer: Self-pay | Admitting: Cardiology

## 2016-06-07 NOTE — Telephone Encounter (Signed)
Spoke w patient. Result notes updated.

## 2016-06-07 NOTE — Telephone Encounter (Signed)
Returning your call. °

## 2016-06-08 ENCOUNTER — Ambulatory Visit (HOSPITAL_COMMUNITY)
Admission: RE | Admit: 2016-06-08 | Discharge: 2016-06-08 | Disposition: A | Discharge status: Home / Self Care | Payer: Commercial Managed Care - HMO | Source: Ambulatory Visit | Attending: Pulmonary Disease | Admitting: Pulmonary Disease

## 2016-06-08 DIAGNOSIS — I700 Atherosclerosis of aorta: Secondary | ICD-10-CM | POA: Insufficient documentation

## 2016-06-08 DIAGNOSIS — D3501 Benign neoplasm of right adrenal gland: Secondary | ICD-10-CM | POA: Diagnosis not present

## 2016-06-08 DIAGNOSIS — R918 Other nonspecific abnormal finding of lung field: Secondary | ICD-10-CM | POA: Insufficient documentation

## 2016-06-08 DIAGNOSIS — I2510 Atherosclerotic heart disease of native coronary artery without angina pectoris: Secondary | ICD-10-CM | POA: Insufficient documentation

## 2016-06-08 DIAGNOSIS — J61 Pneumoconiosis due to asbestos and other mineral fibers: Secondary | ICD-10-CM | POA: Diagnosis not present

## 2016-06-08 DIAGNOSIS — R911 Solitary pulmonary nodule: Secondary | ICD-10-CM | POA: Insufficient documentation

## 2016-06-08 DIAGNOSIS — R0602 Shortness of breath: Secondary | ICD-10-CM

## 2016-06-08 DIAGNOSIS — J90 Pleural effusion, not elsewhere classified: Secondary | ICD-10-CM | POA: Diagnosis not present

## 2016-06-08 DIAGNOSIS — J439 Emphysema, unspecified: Secondary | ICD-10-CM | POA: Diagnosis not present

## 2016-06-08 DIAGNOSIS — IMO0001 Reserved for inherently not codable concepts without codable children: Secondary | ICD-10-CM

## 2016-06-13 ENCOUNTER — Ambulatory Visit (INDEPENDENT_AMBULATORY_CARE_PROVIDER_SITE_OTHER): Payer: Commercial Managed Care - HMO | Admitting: Pulmonary Disease

## 2016-06-13 ENCOUNTER — Encounter: Payer: Self-pay | Admitting: Pulmonary Disease

## 2016-06-13 VITALS — BP 144/80 | HR 88 | Ht 73.0 in | Wt 202.4 lb

## 2016-06-13 DIAGNOSIS — R0600 Dyspnea, unspecified: Secondary | ICD-10-CM

## 2016-06-13 DIAGNOSIS — R918 Other nonspecific abnormal finding of lung field: Secondary | ICD-10-CM | POA: Diagnosis not present

## 2016-06-13 DIAGNOSIS — J984 Other disorders of lung: Secondary | ICD-10-CM

## 2016-06-13 DIAGNOSIS — J449 Chronic obstructive pulmonary disease, unspecified: Secondary | ICD-10-CM | POA: Diagnosis not present

## 2016-06-13 DIAGNOSIS — R042 Hemoptysis: Secondary | ICD-10-CM | POA: Diagnosis not present

## 2016-06-13 MED ORDER — UMECLIDINIUM-VILANTEROL 62.5-25 MCG/INH IN AEPB: 1.0000 | INHALATION_SPRAY | Freq: Every day | RESPIRATORY_TRACT | 0 refills | Status: DC

## 2016-06-13 NOTE — Progress Notes (Signed)
Test reviewed.  

## 2016-06-13 NOTE — Progress Notes (Signed)
Subjective:    Patient ID: Timothy Guerrero, male    DOB: 1940/11/24, 75 y.o.   MRN: [id number]  C.C.:  Follow-up for Moderate COPD w/ Emphysema, Mild Restrictive Lung Disease, Multiple Lung Nodules, & Stage 1A NSCLC.  HPI Moderate COPD w/ Emphysema:  Reports his dyspnea has improved some. He reports he has cough with blood mixed in the last couple of mornings. He reports mild wheezing. No exacerbations since last appointment.   Mild Restrictive Lung Disease: No suggestion of interstitial lung disease on CT imaging. Likely due to pleural effusion and congestive heart failure.  Multiple Lung Nodules: Supple nodules noted on CT imaging from June 2017. Nodules again appreciated on December 2017 imaging without obvious progression.  Right Lower Lobe Squamous Cell Carcinoma:  Stage pT1b, pN0 (Stage IA). Status post right lower lobectomy June 2016.  Review of Systems No fever, chills, or sweats. No chest pain or pressure. Denies any nausea, emesis, or diarrhea.   No Known Allergies  Current Outpatient Prescriptions on File Prior to Visit  Medication Sig Dispense Refill  . Alum & Mag Hydroxide-Simeth (GI COCKTAIL) SUSP suspension Take 30 mLs by mouth as directed. Follow directions on bottle.Marland KitchenMarland KitchenShake well. 240 mL 0  . ferrous sulfate 325 (65 FE) MG tablet Take 1 tablet (325 mg total) by mouth 2 (two) times daily with a meal. 60 tablet 2  . glipiZIDE (GLUCOTROL) 10 MG tablet TAKE 1 TABLET EVERY DAY 90 tablet 0  . Insulin Glargine (LANTUS SOLOSTAR) 100 UNIT/ML Solostar Pen Inject 20-40 Units into the skin daily at 10 pm. 5 pen PRN  . levothyroxine (SYNTHROID, LEVOTHROID) 50 MCG tablet Take 1 tablet (50 mcg total) by mouth daily. 90 tablet 0  . lisinopril (PRINIVIL,ZESTRIL) 20 MG tablet Take 10 mg by mouth daily.    . metFORMIN (GLUCOPHAGE) 1000 MG tablet Take 1 tablet (1,000 mg total) by mouth 2 (two) times daily. 180 tablet 1  . metoprolol succinate (TOPROL-XL) 100 MG 24 hr tablet Take 100 mg by  mouth daily.     [name] omeprazole (PRILOSEC) 20 MG capsule Take 1 capsule (20 mg total) by mouth daily. 30 capsule 5  . pantoprazole (PROTONIX) 40 MG tablet Take 1 tablet (40 mg total) by mouth as directed. Take 40 mg twice daily for 1 week then decrease to 40 mg once a dady 45 tablet 3  . sucralfate (CARAFATE) 1 GM/10ML suspension Take 10 mLs (1 g total) by mouth 2 (two) times daily. Take before breakfast and at bedtime 420 mL 0  . tiotropium (SPIRIVA) 18 MCG inhalation capsule Place 18 mcg into inhaler and inhale daily.    . valACYclovir (VALTREX) 1000 MG tablet TAKE 2 TABLETS TWICE DAILY  FOR  1  DAY  FOR  COLD  SORES 12 tablet 2   No current facility-administered medications on file prior to visit.     Past Medical History:  Diagnosis Date  . Arthritis   . Atrial flutter ([place])    post op following lobectomy  . Benign essential tremor   . Cataract   . Colon polyp    2007, polyp with early cancer, one year f/u no residual polyp. next TCS 2010, see PSH. [facility].  . Diabetes mellitus without complication ([place])    10 years  . HOH (hard of hearing)   . Hypercholesteremia   . Hypertension    10 years  . Lung cancer ([place])   . Prostate cancer ([place])   . Shortness  of breath dyspnea    due to lung mass  . Skin melanoma ([place])   . Sleep apnea    wears CPAP sometimes, cannot tolerate all the time.    Past Surgical History:  Procedure Laterality Date  . bottom lobe of right lung removed  12/17/14  . CARDIAC CATHETERIZATION     no PCI  . CATARACT EXTRACTION Left   . CATARACT EXTRACTION W/PHACO Left 01/03/2016   Procedure: CATARACT EXTRACTION PHACO AND INTRAOCULAR LENS PLACEMENT LEFT EYE CDE=7.78;  Surgeon: Williams Che, MD;  Location: [facility];  Service: Ophthalmology;  Laterality: Left;  . CATARACT EXTRACTION W/PHACO Right 03/20/2016   Procedure: CATARACT EXTRACTION PHACO AND INTRAOCULAR LENS PLACEMENT; CDE:  9.58;  Surgeon: Williams Che, MD;  Location: [facility];   Service: Ophthalmology;  Laterality: Right;  . COLONOSCOPY  08/2008   Dr. Leafy Half, [place]: normal, internal grade 1 hemorrhoids. next TCS 08/2013  . COLONOSCOPY N/A 03/04/2014   Procedure: COLONOSCOPY;  Surgeon: Daneil Dolin, MD;  [facility];  Service: Endoscopy;  Laterality: N/A;  7:30am  . EYE SURGERY    . knee replacements     bilateral. over 10 years ago  . LOBECTOMY Right 12/17/2014   Procedure: RIGHT LOWER LOBECTOMY;  Surgeon: Melrose Nakayama, MD;  Location: [place];  Service: Thoracic;  Laterality: Right;  . LYMPH NODE DISSECTION Right 12/17/2014   Procedure: LYMPH NODE DISSECTION;  Surgeon: Melrose Nakayama, MD;  Location: [place];  Service: Thoracic;  Laterality: Right;  . NECK SURGERY     melanoma removed.  [name] PROSTATE SURGERY     prostatectomy, radical  . TOTAL HIP ARTHROPLASTY  2007   left  . total shoulder replacement  2011   left  . VIDEO ASSISTED THORACOSCOPY (VATS)/WEDGE RESECTION Right 12/17/2014   Procedure: RIGHT VIDEO ASSISTED THORACOSCOPY (VATS);  Surgeon: Melrose Nakayama, MD;  Location: [place];  Service: Thoracic;  Laterality: Right;    Family History  Problem Relation Age of Onset  . Cancer Mother     unknown type  . COPD Sister   . Breast cancer Sister   . Cancer Sister 79    breast  . Prostate cancer Son     died age 62  . Heart attack Daughter   . Colon cancer Neg Hx     Social History   Social History  . Marital status: Divorced    Spouse name: N/A  . Number of children: 3  . Years of education: N/A   Occupational History  . retired    Social History Main Topics  . Smoking status: Former Smoker    Packs/day: 2.00    Years: 32.00    Types: Cigarettes    Quit date: 07/03/1986  . Smokeless tobacco: Never Used  . Alcohol use No  . Drug use: No  . Sexual activity: Yes   Other Topics Concern  . None   Social History Engineer, materials.  Lives alone.       Pulmonary:   Originally from  [facility]. Served in [employer] with significant exposure to asbestos. 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 He has also lived in [place], [place], & [place]. He has lived in [place] since 1979. 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 No pets currently. Remote bird exposure.       Objective:   Physical Exam BP (!) 144/80 (Cuff Size: Normal)   Pulse  88   Ht '6\' 1"'$  (1.854 m)   Wt 202 lb 6.4 oz (91.8 kg)   SpO2 97%   BMI 26.70 kg/m  General:  Awake. Alert. No distress.  Integument:  Warm & dry. No rash on exposed skin.  Lymphatics:  No appreciated cervical or supraclavicular lymphadenoapthy. HEENT:  Moist mucus membranes. No nasal turbinate swelling. No scleral injection or icterus.  Cardiovascular:  Regular rate. No edema. Unable to appreciate JVD with body positioning.  Pulmonary:  Normal work of breathing on room air. Slightly diminished breath sounds in the lung bases with dullness to percussion. Otherwise clear to auscultation. Abdomen: Soft. Normal bowel sounds. Protuberant.  PFT 05/17/16: FVC 3.47 L (73%) FEV1 2.12 L (61%) FEV1/FVC 0.61 FEF 25-75 1.05 L (41%) positive bronchodilator response TLC 5.67 L (74%) RV 70% ERV 69% DLCO uncorrected 43%  6MWT 06/13/16:  Walked 336 meters / Baseline Sat 98% on RA / Nadir Sat 97% on RA  IMAGING CT CHEST W/O 06/08/16 (personally reviewed by me): Bilateral pleural plaques with some calcification. Subcentimeter pulmonary nodules again noted. Emphysema also again noted. Slight enlargement in right pleural effusion with small left pleural effusion. No pericardial effusion. Adrenal nodule also noted. No pathologic mediastinal adenopathy.  CT CHEST W/ 12/21/15 (previously reviewed by me): No pericardial effusion. Bilateral calcified pleural plaques. Trace right pleural effusion. Status post right lower lobectomy. Centrilobular and paraseptal emphysema that is apical predominant. 2 mm left upper lobe nodule stable since 2012. 3 mm left lower lobe nodule was not present on  previous CT imaging. 1.6 cm right adrenal nodule consistent with imaging from 2012.  CARDIAC TTE (06/06/16): LV normal in size with mild LVH. EF 25-30% with diffuse hypokinesis. Grade 2 diastolic dysfunction. LA moderately dilated & RA normal in size. RV normal in size and function. No aortic stenosis or regurgitation. Aortic root normal in size. Mild to moderate mitral regurgitation without stenosis. No pulmonic stenosis. Trivial tricuspid regurgitation. No pericardial effusion.  PATHOLOGY Right Lower Lobectomy (12/17/14):  Right Pleural Biposy w/o malignancy but calcification present / 2.8 cm Invasive Poorly Differentiated Squamous Cell Carcinoma / Lymph Nodes Negative   LABS 05/15/16 CBC:  7.6/7.3/24.5/352 Ferritin:  6.6 Alpha-1 antitrypsin: MM (163)  03/14/16 CBC:  9.5/9.2/30.6/381    Assessment & Plan:  75 y.o. male s/p right lower lobectomy with Squamous Cell Lung Cancer in June 2016. Patient's only function testing shows moderate airway obstruction with significant bronchodilator response indicating he may respond to additional inhaled medication therapy. I suspect his mildly blood tinged mucus/hemoptysis is due to his underlying congestive heart failure with bilateral pleural effusions seen on his CT imaging. He doesn't have overt signs otherwise of volume overload but may benefit from diuretic therapy especially in the setting of his low ejection fraction. I will defer to cardiology in the management of his congestive heart failure. I also reviewed his chest CT scan with him today which does show bilateral pleural effusions as well as continued stability in his parenchymal lung nodules. I instructed the patient contact my office if he had any new breathing problems or questions before his next appointment.  1. Moderate COPD w/ Emphysema:  Trying patient on Anoro in place of Spiriva. Repeat spirometry with bronchodilator challenge and next appointment to ensure maximal bronchodilatation.  Patient will contact my office for prescription if this inhaler seems to work better. 2. Mild Restrictive Lung Disease: Likely secondary to congestive heart failure. No further testing necessary at this time. Has follow-up with Cardiology tomorrow. 3.  Multiple Lung Nodules: No signs of progression. Plan for repeat CT imaging of the chest in June to ensure improvement in pleural effusions and no signs of progression in lung nodules. 4. Hemoptysis:  Mild. Likely due to CHF. Patient to notify me if this persists or worsens.  5. Right Lower Lobe Squamous Cell Carcinoma:  Stage pT1b, pN0 (Stage IA). Status post right lower lobectomy June 2016. 6. Health Maintenance:  S/P Influenza Vaccine October 2017, Prevnar January 2017, & Tdap October 2017.  7. Follow-up: Return to clinic in 3 months or sooner if needed.  Sonia Baller Ashok Cordia, M.D. [facility] Pulmonary & Critical Care Pager:  [phone] After 3pm or if no response, call [phone] 10:05 AM 06/13/16

## 2016-06-13 NOTE — Patient Instructions (Addendum)
   Call me if you have any new breathing problems before your next appointment   Use the Anoro inhaler we are giving you in place of your Spiriva inhaler. Inhale 1 puff daily. Call me if this seems to help your coughing, wheezing, & breathing and we will send in a prescription.  Call me if you keep coughing up blood or notice it is getting worse.  I will see you back in 3 months or sooner if needed.  TESTS ORDERED: 1. Spirometry with bronchodilator challenge at next appointment

## 2016-06-14 ENCOUNTER — Ambulatory Visit: Payer: Self-pay | Admitting: Pharmacist

## 2016-06-14 ENCOUNTER — Ambulatory Visit (INDEPENDENT_AMBULATORY_CARE_PROVIDER_SITE_OTHER): Payer: Commercial Managed Care - HMO | Admitting: Physician Assistant

## 2016-06-14 ENCOUNTER — Encounter: Payer: Self-pay | Admitting: Physician Assistant

## 2016-06-14 VITALS — BP 138/80 | HR 82 | Ht 73.0 in | Wt 203.0 lb

## 2016-06-14 DIAGNOSIS — I5023 Acute on chronic systolic (congestive) heart failure: Secondary | ICD-10-CM

## 2016-06-14 DIAGNOSIS — I429 Cardiomyopathy, unspecified: Secondary | ICD-10-CM | POA: Diagnosis not present

## 2016-06-14 DIAGNOSIS — I480 Paroxysmal atrial fibrillation: Secondary | ICD-10-CM

## 2016-06-14 DIAGNOSIS — I509 Heart failure, unspecified: Secondary | ICD-10-CM | POA: Insufficient documentation

## 2016-06-14 MED ORDER — FUROSEMIDE 20 MG PO TABS: 20.0000 mg | ORAL_TABLET | Freq: Every day | ORAL | 0 refills | Status: DC

## 2016-06-14 MED ORDER — FUROSEMIDE 20 MG PO TABS: 20.0000 mg | ORAL_TABLET | Freq: Every day | ORAL | 11 refills | Status: DC

## 2016-06-14 MED ORDER — POTASSIUM CHLORIDE ER 10 MEQ PO TBCR: 10.0000 meq | EXTENDED_RELEASE_TABLET | Freq: Every day | ORAL | 3 refills | Status: DC

## 2016-06-14 NOTE — Progress Notes (Signed)
Cardiology Office Note   Date:  06/14/2016   ID:  Timothy, Guerrero [birth year], MRN [id number]  PCP:  Kenn File, MD  Cardiologist:    Dr Percival Spanish 02/02/2016  [name], Hershal Coria  05/12/2016  Chief Complaint  Patient presents with  . Follow-up  . Shortness of Breath    History of Present Illness: Timothy Guerrero is a 75 y.o. male with a history of mod COPD w/ emphysema, atrial flutter after lobectomy 2016 for [place] lung CA, DM, HLD, HTN, HOH, prostate CA, OSA +/- CPAP, CAD w/ small RCA w/ >80% stenosis at [facility] 2013, neg stress test 2015, EF nl. He was on coumadin.   11/10, seen for CP, DOE, Echo w/ EF now 25-30%, f/u appt made.  11/13, seen by Dr Ashok Cordia, H&H 7.3/24.5, s/p transfusion 2 U PRBCs. Coumadin discontinued 11/17 & 11/27, seen by Dr [name], resp status improved, microcytic anemia w/ low ferritin>>Iron supplement  Timothy Guerrero presents for cardiology followup.   His symptoms have somewhat improved, but he is still very dyspneic on exertion. It is noted that his weight has gone up 10 pounds since he was seen on 11/10. He denies lower extremity edema, but describes orthopnea and PND. He has not been wheezing. He has not had any chest pain. He has not had any resting shortness of breath. He has not had palpitations and feels like he has not been in atrial fibrillation.   Prior to finding out that his hemoglobin had dropped, he did not know of any bleeding issues. He denies any black or tarry stools. He had not been having any bright red blood per rectum and no hematuria. Since being started on the iron, his stools have been dark.  He has not had any acute illnesses. He had surgery for his lung cancer, and never had to take chemotherapy. The chest pain that he was having when he was seen in the office in November, has completely resolved. He feels that the GI cocktail and other medications are working on that pain.  He has not been thinking about sodium and the foods  that he eats. He admits that it may be more than he should have.   Past Medical History:  Diagnosis Date  . Arthritis   . Atrial flutter ([place])    post op following lobectomy  . Benign essential tremor   . Cataract   . Colon polyp    2007, polyp with early cancer, one year f/u no residual polyp. next TCS 2010, see PSH. [facility].  . Diabetes mellitus without complication ([place])    10 years  . HOH (hard of hearing)   . Hypercholesteremia   . Hypertension    10 years  . Lung cancer ([place])   . Prostate cancer ([place])   . Shortness of breath dyspnea    due to lung mass  . Skin melanoma ([place])   . Sleep apnea    wears CPAP sometimes, cannot tolerate all the time.    Past Surgical History:  Procedure Laterality Date  . bottom lobe of right lung removed  12/17/14  . CARDIAC CATHETERIZATION     no PCI  . CATARACT EXTRACTION Left   . CATARACT EXTRACTION W/PHACO Left 01/03/2016   Procedure: CATARACT EXTRACTION PHACO AND INTRAOCULAR LENS PLACEMENT LEFT EYE CDE=7.78;  Surgeon: Williams Che, MD;  [facility];  Service: Ophthalmology;  Laterality: Left;  . CATARACT EXTRACTION W/PHACO Right 03/20/2016  Procedure: CATARACT EXTRACTION PHACO AND INTRAOCULAR LENS PLACEMENT; CDE:  9.58;  Surgeon: Williams Che, MD;  [facility];  Service: Ophthalmology;  Laterality: Right;  . COLONOSCOPY  08/2008   Dr. Leafy Half, [place]: normal, internal grade 1 hemorrhoids. next TCS 08/2013  . COLONOSCOPY N/A 03/04/2014   Procedure: COLONOSCOPY;  Surgeon: Daneil Dolin, MD;  [facility];  Service: Endoscopy;  Laterality: N/A;  7:30am  . EYE SURGERY    . knee replacements     bilateral. over 10 years ago  . LOBECTOMY Right 12/17/2014   Procedure: RIGHT LOWER LOBECTOMY;  Surgeon: Melrose Nakayama, MD;  Location: [place];  Service: Thoracic;  Laterality: Right;  . LYMPH NODE DISSECTION Right 12/17/2014   Procedure: LYMPH NODE DISSECTION;  Surgeon:  Melrose Nakayama, MD;  Location: [place];  Service: Thoracic;  Laterality: Right;  . NECK SURGERY     melanoma removed.  [name] PROSTATE SURGERY     prostatectomy, radical  . TOTAL HIP ARTHROPLASTY  2007   left  . total shoulder replacement  2011   left  . VIDEO ASSISTED THORACOSCOPY (VATS)/WEDGE RESECTION Right 12/17/2014   Procedure: RIGHT VIDEO ASSISTED THORACOSCOPY (VATS);  Surgeon: Melrose Nakayama, MD;  Location: [facility];  Service: Thoracic;  Laterality: Right;    Current Outpatient Prescriptions  Medication Sig Dispense Refill  . Alum & Mag Hydroxide-Simeth (GI COCKTAIL) SUSP suspension Take 30 mLs by mouth as directed. Follow directions on bottle.Marland KitchenMarland KitchenShake well. 240 mL 0  . ferrous sulfate 325 (65 FE) MG tablet Take 1 tablet (325 mg total) by mouth 2 (two) times daily with a meal. 60 tablet 2  . glipiZIDE (GLUCOTROL) 10 MG tablet TAKE 1 TABLET EVERY DAY 90 tablet 0  . Insulin Glargine (LANTUS SOLOSTAR) 100 UNIT/ML Solostar Pen Inject 20-40 Units into the skin daily at 10 pm. 5 pen PRN  . levothyroxine (SYNTHROID, LEVOTHROID) 50 MCG tablet Take 1 tablet (50 mcg total) by mouth daily. 90 tablet 0  . lisinopril (PRINIVIL,ZESTRIL) 20 MG tablet Take 10 mg by mouth daily.    . metFORMIN (GLUCOPHAGE) 1000 MG tablet Take 1 tablet (1,000 mg total) by mouth 2 (two) times daily. 180 tablet 1  . metoprolol succinate (TOPROL-XL) 100 MG 24 hr tablet Take 100 mg by mouth daily.     [name] omeprazole (PRILOSEC) 20 MG capsule Take 1 capsule (20 mg total) by mouth daily. 30 capsule 5  . pantoprazole (PROTONIX) 40 MG tablet Take 1 tablet (40 mg total) by mouth as directed. Take 40 mg twice daily for 1 week then decrease to 40 mg once a dady 45 tablet 3  . sucralfate (CARAFATE) 1 GM/10ML suspension Take 10 mLs (1 g total) by mouth 2 (two) times daily. Take before breakfast and at bedtime 420 mL 0  . tiotropium (SPIRIVA) 18 MCG inhalation capsule Place 18 mcg into inhaler and inhale daily.    [name]  umeclidinium-vilanterol (ANORO ELLIPTA) 62.5-25 MCG/INH AEPB Inhale 1 puff into the lungs daily. 7 each 0  . valACYclovir (VALTREX) 1000 MG tablet TAKE 2 TABLETS TWICE DAILY  FOR  1  DAY  FOR  COLD  SORES 12 tablet 2   No current facility-administered medications for this visit.     Allergies:   Patient has no known allergies.    Social History:  The patient  reports that he quit smoking about 29 years ago. His smoking use included Cigarettes. He has a 64.00 pack-year  smoking history. He has never used smokeless tobacco. He reports that he does not drink alcohol or use drugs.   Family History:  The patient's family history includes Breast cancer in his sister; COPD in his sister; Cancer in his mother; Cancer (age of onset: 73) in his sister; Heart attack in his daughter; Prostate cancer in his son.    ROS:  Please see the history of present illness. All other systems are reviewed and negative.    PHYSICAL EXAM: VS:  BP 138/80   Pulse 82   Ht '6\' 1"'$  (1.854 m)   Wt 203 lb (92.1 kg)   BMI 26.78 kg/m  , BMI Body mass index is 26.78 kg/m. GEN: Well nourished, well developed, male in no acute distress  HEENT: normal for age  Neck: JVD 9-10 centimeters, positive hepatojugular reflux, no carotid bruit, no masses Cardiac: RRR; no murmur, no rubs, or gallops Respiratory:  clear to auscultation bilaterally, normal work of breathing GI: soft, nontender, nondistended, + BS MS: no deformity or atrophy; no edema; distal pulses are 2+ in all 4 extremities   Skin: warm and dry, no rash Neuro:  Strength and sensation are intact Psych: euthymic mood, full affect   EKG:  EKG is not ordered today.  ECHO: 06/06/2016 - Left ventricle: The cavity size was normal. Wall thickness was   increased in a pattern of mild LVH. Systolic function was   severely reduced. The estimated ejection fraction was in the   range of 25% to 30%. Diffuse hypokinesis. Features are consistent   with a pseudonormal left  ventricular filling pattern, with   concomitant abnormal relaxation and increased filling pressure   (grade 2 diastolic dysfunction). Doppler parameters are   consistent with high ventricular filling pressure. - Mitral valve: Calcified annulus. There was mild to moderate   regurgitation. - Left atrium: The atrium was moderately dilated. - Pulmonary arteries: Systolic pressure was moderately increased. Impressions: - Severe global reduction in LV systolic function; grade 2   diastolic dysfunction with elevated LV filling pressure; moderate   LAE; mild to moderate MR; trace TR with moderately elevated   pulmonary pressure.  Recent Labs: 03/14/2016: BUN 18; Creatinine, Ser 0.81; Potassium 3.6; Sodium 141 05/17/2016: Hemoglobin 8.8 05/29/2016: Platelets 337    Lipid Panel    Component Value Date/Time   CHOL 191 05/13/2015 1133   TRIG 199 (H) 05/13/2015 1133   HDL 35 (L) 05/13/2015 1133   CHOLHDL 5.5 (H) 05/13/2015 1133   LDLCALC 116 (H) 05/13/2015 1133     Wt Readings from Last 3 Encounters:  06/14/16 203 lb (92.1 kg)  06/13/16 202 lb 6.4 oz (91.8 kg)  05/29/16 196 lb 6.4 oz (89.1 kg)     Other studies Reviewed: Additional studies/ records that were reviewed today include: Office notes and testing.  ASSESSMENT AND PLAN:  1.  Acute on chronic systolic CHF: His weight has gone up almost 10 pounds since he was seen in early November. The reason for this is unclear.   He has not been watching his diet carefully, but did not think he had significant dietary indiscretions. He is encouraged to watch the sodium in food that he eats and was given information on this.  He will be started on Lasix 20 mg daily. He is to get a BMET and we will decide on potassium supplementation after that is reviewed. Track daily weights. Let us know if he is having any side effects from the medication.  2. Cardiomyopathy: I  discussed with him the fact that we do not have a clear cause for his  cardiomyopathy. He is not having any ischemic symptoms, but this still could be ischemic.   At this time, I do not feel his respiratory status is good enough to pursue right and left heart catheterization.  Because of the holidays, he does not wish to pursue ischemic evaluation right now either. He will get repeat labs after the holidays and then follow-up in the office. We will decide at that time if his respiratory status is good enough for ischemic evaluation.  3. PAF: He is not having palpitations and does not feel that he is having any atrial fibrillation. He is encouraged to let us know if he has any problems or concerns. At this point in time, the risk of anticoagulation outweighs the benefits. Stay off Coumadin for now. CHA2DS2VASc=5 (age x 2, DM, HTN, CHF)  4. GI issues: Timothy Guerrero was having significant pain that was consistent with GI origin. He was started on medications for this and his symptoms have resolved. However, that may be a cause for his anemia. He is encouraged to follow-up with Dr. [name] for this.   Current medicines are reviewed at length with the patient today.  The patient does not have concerns regarding medicines.  The following changes have been made:  no change  Labs/ tests ordered today include:  No orders of the defined types were placed in this encounter.    Disposition:   FU with Dr. Debara Pickett or myself  Signed, Lenoard Aden  06/14/2016 10:40 AM    [place] Phone: [phone]; Fax: [phone]  This note was written with the assistance of speech recognition software. Please excuse any transcriptional errors.

## 2016-06-14 NOTE — Patient Instructions (Addendum)
Medication Instructions:  TAKE LASIX DAILY UNTIL YOU ARE DOWN 10 POUNDS.  CLARIFY IF YOU ARE TAKING OMEPRAZOLE OR PROTONIX AND CALL us TO UPDATE  TAKE POTASSIUM 10MeQ ONLY ON DAYS THAT YOU ARE TAKING LASIX  CALL IF YOU WEIGHT IS UP 3 PONDS IN ONE DAY OR 5 POUNDS IN ONE WEEK  CALL IF YOU ARE HAVING SHORTNESS OF BREATH THAT IS WORSE OR IF YOU ARE HAVING CHEST PAIN  TAKE DAILY WEIGHTS  If you need a refill on your cardiac medications before your next appointment, please call your pharmacy.  Labwork: BMET AND CBC AT [facility] ON 06-27-16  Testing/Procedures: NONE  Follow-Up: Your physician recommends that you schedule a follow-up appointment after: 06-27-16 WITH DR HILTY OR RHONDA BARRETT,PA   Any Other Special Instructions Will Be Listed Below (If Applicable). DASH Eating Plan DASH stands for "Dietary Approaches to Stop Hypertension." The DASH eating plan is a healthy eating plan that has been shown to reduce high blood pressure (hypertension). Additional health benefits may include reducing the risk of type 2 diabetes mellitus, heart disease, and stroke. The DASH eating plan may also help with weight loss. What do I need to know about the DASH eating plan? For the DASH eating plan, you will follow these general guidelines:  Choose foods with less than 150 milligrams of sodium per serving (as listed on the food label).  Use salt-free seasonings or herbs instead of table salt or sea salt.  Check with your health care provider or pharmacist before using salt substitutes.  Eat lower-sodium products. These are often labeled as "low-sodium" or "no salt added."  Eat fresh foods. Avoid eating a lot of canned foods.  Eat more vegetables, fruits, and low-fat dairy products.  Choose whole grains. Look for the word "whole" as the first word in the ingredient list.  Choose fish and skinless chicken or [place] more often than red meat. Limit fish, poultry, and meat to 6 oz (170  g) each day.  Limit sweets, desserts, sugars, and sugary drinks.  Choose heart-healthy fats.  Eat more home-cooked food and less restaurant, buffet, and fast food.  Limit fried foods.  Do not fry foods. Cook foods using methods such as baking, boiling, grilling, and broiling instead.  When eating at a restaurant, ask that your food be prepared with less salt, or no salt if possible. What foods can I eat? Seek help from a dietitian for individual calorie needs. Grains  Whole grain or whole wheat bread. Brown rice. Whole grain or whole wheat pasta. Quinoa, bulgur, and whole grain cereals. Low-sodium cereals. Corn or whole wheat flour tortillas. Whole grain cornbread. Whole grain crackers. Low-sodium crackers. Vegetables  Fresh or frozen vegetables (raw, steamed, roasted, or grilled). Low-sodium or reduced-sodium tomato and vegetable juices. Low-sodium or reduced-sodium tomato sauce and paste. Low-sodium or reduced-sodium canned vegetables. Fruits  All fresh, canned (in natural juice), or frozen fruits. Meat and Other Protein Products  Ground beef (85% or leaner), grass-fed beef, or beef trimmed of fat. Skinless chicken or [place]. Ground chicken or [place]. Pork trimmed of fat. All fish and seafood. Eggs. Dried beans, peas, or lentils. Unsalted nuts and seeds. Unsalted canned beans. Dairy  Low-fat dairy products, such as skim or 1% milk, 2% or reduced-fat cheeses, low-fat ricotta or cottage cheese, or plain low-fat yogurt. Low-sodium or reduced-sodium cheeses. Fats and Oils  Tub margarines without trans fats. Light or reduced-fat mayonnaise and salad dressings (reduced sodium). Avocado. Safflower, olive, or canola oils. Natural  peanut or almond butter. Other  Unsalted popcorn and pretzels. The items listed above may not be a complete list of recommended foods or beverages. Contact your dietitian for more options.  What foods are not recommended? Grains  White bread. White pasta. White  rice. Refined cornbread. Bagels and croissants. Crackers that contain trans fat. Vegetables  Creamed or fried vegetables. Vegetables in a cheese sauce. Regular canned vegetables. Regular canned tomato sauce and paste. Regular tomato and vegetable juices. Fruits  Canned fruit in light or heavy syrup. Fruit juice. Meat and Other Protein Products  Fatty cuts of meat. Ribs, chicken wings, bacon, sausage, bologna, salami, chitterlings, fatback, hot dogs, bratwurst, and packaged luncheon meats. Salted nuts and seeds. Canned beans with salt. Dairy  Whole or 2% milk, cream, half-and-half, and cream cheese. Whole-fat or sweetened yogurt. Full-fat cheeses or blue cheese. Nondairy creamers and whipped toppings. Processed cheese, cheese spreads, or cheese curds. Condiments  Onion and garlic salt, seasoned salt, table salt, and sea salt. Canned and packaged gravies. Worcestershire sauce. Tartar sauce. Barbecue sauce. Teriyaki sauce. Soy sauce, including reduced sodium. Steak sauce. Fish sauce. Oyster sauce. Cocktail sauce. Horseradish. Ketchup and mustard. Meat flavorings and tenderizers. Bouillon cubes. Hot sauce. Tabasco sauce. Marinades. Taco seasonings. Relishes. Fats and Oils  Butter, stick margarine, lard, shortening, ghee, and bacon fat. Coconut, palm kernel, or palm oils. Regular salad dressings. Other  Pickles and olives. Salted popcorn and pretzels. The items listed above may not be a complete list of foods and beverages to avoid. Contact your dietitian for more information.  Where can I find more information? National Heart, Lung, and Blood Institute: travelstabloid.com This information is not intended to replace advice given to you by your health care provider. Make sure you discuss any questions you have with your health care provider. Document Released: 06/08/2011 Document Revised: 11/25/2015 Document Reviewed: 04/23/2013 Elsevier Interactive Patient Education   2017 [place]  Thank you for choosing [employer]!!

## 2016-06-15 ENCOUNTER — Telehealth: Payer: Self-pay | Admitting: Family Medicine

## 2016-06-15 ENCOUNTER — Other Ambulatory Visit: Payer: Self-pay | Admitting: Family Medicine

## 2016-06-15 DIAGNOSIS — I5023 Acute on chronic systolic (congestive) heart failure: Secondary | ICD-10-CM

## 2016-06-15 NOTE — Telephone Encounter (Signed)
Last Thyroid level was 02/2015

## 2016-06-15 NOTE — Telephone Encounter (Signed)
Pt states his Cardiologist wanted him to come here on the 12/26 to have a repeat BMP drawn. Future order put in.

## 2016-06-21 ENCOUNTER — Telehealth: Payer: Self-pay | Admitting: Pulmonary Disease

## 2016-06-21 ENCOUNTER — Telehealth: Payer: Self-pay | Admitting: Family Medicine

## 2016-06-21 ENCOUNTER — Telehealth: Payer: Self-pay | Admitting: Cardiology

## 2016-06-21 MED ORDER — UMECLIDINIUM-VILANTEROL 62.5-25 MCG/INH IN AEPB: 1.0000 | INHALATION_SPRAY | Freq: Every day | RESPIRATORY_TRACT | 3 refills | Status: AC

## 2016-06-21 NOTE — Telephone Encounter (Signed)
Timothy Guerrero is calling and has a question about his inhaler. Please call. Thanks

## 2016-06-21 NOTE — Telephone Encounter (Signed)
Patient aware to call Dr.Nestor to get refills on his Anor.

## 2016-06-21 NOTE — Telephone Encounter (Signed)
Returned call to patient. He states [place], [place] gave him samples of anoro ellipta inhaler to try. He states he needs his breathing to get better for a heart cath. Explained that our records show that [facility] provided this medication and that I can forward her a message that this inhaler is working for him. He was insistent that [place], [place] provided samples and got frustrated. Asked for her to call him. Informed him that I will route a message to her that this is effective for him, as she is out of the office today.

## 2016-06-21 NOTE — Telephone Encounter (Signed)
lmtcb

## 2016-06-21 NOTE — Telephone Encounter (Signed)
Pt is requesting Rx for anoro, as he feels it is helping him Rx sent to preferred pharmacy. Pt aware & voiced his understanding. Nothing further needed.

## 2016-06-22 ENCOUNTER — Telehealth: Payer: Self-pay | Admitting: Pulmonary Disease

## 2016-06-22 NOTE — Telephone Encounter (Signed)
Samples have been left up front for pick up. Pt is aware. Nothing further was needed. 

## 2016-06-27 ENCOUNTER — Other Ambulatory Visit: Payer: Commercial Managed Care - HMO

## 2016-06-27 DIAGNOSIS — I5023 Acute on chronic systolic (congestive) heart failure: Secondary | ICD-10-CM | POA: Diagnosis not present

## 2016-06-27 LAB — BMP8+EGFR
BUN/Creatinine Ratio: 12 (ref 10–24)
BUN: 11 mg/dL (ref 8–27)
CO2: 27 mmol/L (ref 18–29)
Calcium: 9.9 mg/dL (ref 8.6–10.2)
Chloride: 103 mmol/L (ref 96–106)
Creatinine, Ser: 0.89 mg/dL (ref 0.76–1.27)
GFR calc Af Amer: 97 mL/min/{1.73_m2} (ref >59)
GFR calc non Af Amer: 84 mL/min/{1.73_m2} (ref >59)
Glucose: 187 mg/dL — ABNORMAL HIGH (ref 65–99)
Potassium: 4 mmol/L (ref 3.5–5.2)
Sodium: 146 mmol/L — ABNORMAL HIGH (ref 134–144)

## 2016-06-27 NOTE — Progress Notes (Signed)
Cardiology Office Note   Date:  06/28/2016   ID:  Timothy Guerrero, Timothy Guerrero 09-27-1940, MRN [id number]  PCP:  Kenn File, MD  Cardiologist:   Minus Breeding, MD   Chief Complaint  Patient presents with  . Shortness of Breath      History of Present Illness: Timothy Guerrero is a 75 y.o. male who presents for follow-up of atrial flutter. He was doing this paroxysmally in the hospital when he was admitted for lobectomy for non-small cell lung cancer. We started him on anticoagulation. Atrial Fibrillation Clinic did not think ablation was indicated. In the past he's had nonobstructive disease and small vessel disease with high-grade stenosis and a nondominant right coronary artery on cath in 2013 in the [place]. He had a stress test in 2015 which was negative. He's been managed medically. He's had a well preserved ejection fraction.  He has had continued problems with HTN and has had adjustments to his meds.    Since I last saw him he recently saw [name].   He had increased shortness of breath. She treated in with diuretics and he seemed to improve. He's down about 10 pounds since that appointment. He got an echocardiogram and is now found to have anemia 20-25% which is new. He's not had any chest pressure, neck or arm discomfort. She's not had any palpitations, presyncope or syncope. He short of breath walking 20 feet on level ground although this is improved. He's not having any PND or orthopnea. He has seen Dr. Ashok Cordia.  I reviewed recent coronary function testing and CT. He's had no acute change and is due to be followed up.  He has been treated with Anora but has trouble affording this.   Past Medical History:  Diagnosis Date  . Arthritis   . Atrial flutter ([place])    post op following lobectomy  . Benign essential tremor   . Cataract   . Colon polyp    2007, polyp with early cancer, one year f/u no residual polyp. next TCS 2010, see PSH. [facility].  . Diabetes  mellitus without complication ([place])    10 years  . HOH (hard of hearing)   . Hypercholesteremia   . Hypertension    10 years  . Lung cancer ([place])   . Prostate cancer ([place])   . Shortness of breath dyspnea    due to lung mass  . Skin melanoma ([place])   . Sleep apnea    wears CPAP sometimes, cannot tolerate all the time.    Past Surgical History:  Procedure Laterality Date  . bottom lobe of right lung removed  12/17/14  . CARDIAC CATHETERIZATION     no PCI  . CATARACT EXTRACTION Left   . CATARACT EXTRACTION W/PHACO Left 01/03/2016   Procedure: CATARACT EXTRACTION PHACO AND INTRAOCULAR LENS PLACEMENT LEFT EYE CDE=7.78;  Surgeon: Williams Che, MD;  Location: [facility];  Service: Ophthalmology;  Laterality: Left;  . CATARACT EXTRACTION W/PHACO Right 03/20/2016   Procedure: CATARACT EXTRACTION PHACO AND INTRAOCULAR LENS PLACEMENT; CDE:  9.58;  Surgeon: Williams Che, MD;  Location: [facility];  Service: Ophthalmology;  Laterality: Right;  . COLONOSCOPY  08/2008   Dr. Leafy Half, [place]: normal, internal grade 1 hemorrhoids. next TCS 08/2013  . COLONOSCOPY N/A 03/04/2014   Procedure: COLONOSCOPY;  Surgeon: Daneil Dolin, MD;  Location: [facility];  Service: Endoscopy;  Laterality: N/A;  7:30am  . EYE  SURGERY    . knee replacements     bilateral. over 10 years ago  . LOBECTOMY Right 12/17/2014   Procedure: RIGHT LOWER LOBECTOMY;  Surgeon: Melrose Nakayama, MD;  Location: [facility];  Service: Thoracic;  Laterality: Right;  . LYMPH NODE DISSECTION Right 12/17/2014   Procedure: LYMPH NODE DISSECTION;  Surgeon: Melrose Nakayama, MD;  Location: [place];  Service: Thoracic;  Laterality: Right;  . NECK SURGERY     melanoma removed.  [name] PROSTATE SURGERY     prostatectomy, radical  . TOTAL HIP ARTHROPLASTY  2007   left  . total shoulder replacement  2011   left  . VIDEO ASSISTED THORACOSCOPY (VATS)/WEDGE RESECTION Right 12/17/2014   Procedure: RIGHT VIDEO ASSISTED  THORACOSCOPY (VATS);  Surgeon: Melrose Nakayama, MD;  Location: [place];  Service: Thoracic;  Laterality: Right;     Current Outpatient Prescriptions  Medication Sig Dispense Refill  . Alum & Mag Hydroxide-Simeth (GI COCKTAIL) SUSP suspension Take 30 mLs by mouth as directed. Follow directions on bottle.Marland KitchenMarland KitchenShake well. 240 mL 0  . ferrous sulfate 325 (65 FE) MG tablet Take 1 tablet (325 mg total) by mouth 2 (two) times daily with a meal. 60 tablet 2  . furosemide (LASIX) 20 MG tablet Take 1 tablet (20 mg total) by mouth daily. 10 tablet 0  . glipiZIDE (GLUCOTROL) 10 MG tablet TAKE 1 TABLET EVERY DAY 90 tablet 0  . Insulin Glargine (LANTUS SOLOSTAR) 100 UNIT/ML Solostar Pen Inject 20-40 Units into the skin daily at 10 pm. 5 pen PRN  . levothyroxine (SYNTHROID, LEVOTHROID) 50 MCG tablet TAKE 1 TABLET EVERY DAY 90 tablet 0  . lisinopril (PRINIVIL,ZESTRIL) 20 MG tablet Take 1 tablet (20 mg total) by mouth daily. 30 tablet 11  . metFORMIN (GLUCOPHAGE) 1000 MG tablet Take 1 tablet (1,000 mg total) by mouth 2 (two) times daily. 180 tablet 1  . metoprolol succinate (TOPROL-XL) 100 MG 24 hr tablet Take 100 mg by mouth daily.     [name] omeprazole (PRILOSEC) 20 MG capsule TAKE 1 CAPSULE EVERY DAY 90 capsule 5  . pantoprazole (PROTONIX) 40 MG tablet Take 1 tablet (40 mg total) by mouth as directed. Take 40 mg twice daily for 1 week then decrease to 40 mg once a dady 45 tablet 3  . potassium chloride (K-DUR) 10 MEQ tablet Take 1 tablet (10 mEq total) by mouth daily. Take only on days when taking lasix 90 tablet 3  . sucralfate (CARAFATE) 1 GM/10ML suspension Take 10 mLs (1 g total) by mouth 2 (two) times daily. Take before breakfast and at bedtime 420 mL 0  . tiotropium (SPIRIVA) 18 MCG inhalation capsule Place 18 mcg into inhaler and inhale daily.    [name] umeclidinium-vilanterol (ANORO ELLIPTA) 62.5-25 MCG/INH AEPB Inhale 1 puff into the lungs daily. 7 each 0  . valACYclovir (VALTREX) 1000 MG tablet TAKE 2 TABLETS  TWICE DAILY  FOR  1  DAY  FOR  COLD  SORES 12 tablet 2   No current facility-administered medications for this visit.     Allergies:   Patient has no known allergies.    ROS:  Please see the history of present illness.   Otherwise, review of systems are positive for none.   All other systems are reviewed and negative.    PHYSICAL EXAM: VS:  BP (!) 146/84   Pulse 84   Ht '6\' 1"'$  (1.854 m)   Wt 195 lb (88.5 kg)   BMI 25.73 kg/m  ,  BMI Body mass index is 25.73 kg/m. GENERAL:  Well appearing NECK:  No jugular venous distention, waveform within normal limits, carotid upstroke brisk and symmetric, no bruits, no thyromegaly LUNGS:  Clear to auscultation bilaterally BACK:  No CVA tenderness CHEST: Well healed right thoracotomy scar HEART:  PMI not displaced or sustained,S1 and S2 within normal limits, no S3, no S4, no clicks, no rubs, no murmurs ABD:  Flat, positive bowel sounds normal in frequency in pitch, no bruits, no rebound, no guarding, no midline pulsatile mass, no hepatomegaly, no splenomegaly EXT:  2 plus pulses throughout, no edema, no cyanosis no clubbing   EKG:  EKG is not ordered today.   Recent Labs: 05/17/2016: Hemoglobin 8.8 05/29/2016: Platelets 337 06/27/2016: BUN 11; Creatinine, Ser 0.89; Potassium 4.0; Sodium 146    Lipid Panel    Component Value Date/Time   CHOL 191 05/13/2015 1133   TRIG 199 (H) 05/13/2015 1133   HDL 35 (L) 05/13/2015 1133   CHOLHDL 5.5 (H) 05/13/2015 1133   LDLCALC 116 (H) 05/13/2015 1133      Wt Readings from Last 3 Encounters:  06/28/16 195 lb (88.5 kg)  06/14/16 203 lb (92.1 kg)  06/13/16 202 lb 6.4 oz (91.8 kg)      Other studies Reviewed: Additional studies/ records that were reviewed today include: CT, PFTs, echo, labs Review of the above records demonstrates:      ASSESSMENT AND PLAN:  ATRIAL FLUTTER:  I think it is very likely that he has paroxysmal flutter.  Timothy Guerrero has a CHA2DS2 - VASc score of 4 with a  risk of stroke of 4%.  He could not afford a DOAC and so he will remain on warfarin.   HTN:  This is being managed in the context of treating his CHF  CAD:  As mentioned he has small vessel disease but no symptoms. I will continue to manage this medically.  ACUTE SYSTOLIC HF:  The etiology of this is unclear. I don't strongly suspect a new infarct since his last nuclear study in 2015. He's had nondominant right stenosis but no other high-grade lesions. I'm going to screen him with a Lexiscan Myoview.  I will increase his lisinopril to 20 mg daily. He had a basic metabolic profile yesterday and I reviewed this. I'll see him back after his stress test in a couple of weeks. In the meantime he'll stay on his diuretic. We talked about low-salt and fluid restriction.  Current medicines are reviewed at length with the patient today.  The patient does have concerns regarding medicines.  The following changes have been made:  See abov  Labs/ tests ordered today include:     Orders Placed This Encounter  Procedures  . Myocardial Perfusion Imaging     Disposition:   FU with me in two weeks.     Signed, Minus Breeding, MD  06/28/2016 4:58 PM    [place]

## 2016-06-28 ENCOUNTER — Encounter: Payer: Self-pay | Admitting: Cardiology

## 2016-06-28 ENCOUNTER — Telehealth: Payer: Self-pay | Admitting: Cardiology

## 2016-06-28 ENCOUNTER — Ambulatory Visit (INDEPENDENT_AMBULATORY_CARE_PROVIDER_SITE_OTHER): Payer: Commercial Managed Care - HMO | Admitting: Cardiology

## 2016-06-28 VITALS — BP 146/84 | HR 84 | Ht 73.0 in | Wt 195.0 lb

## 2016-06-28 DIAGNOSIS — I5021 Acute systolic (congestive) heart failure: Secondary | ICD-10-CM

## 2016-06-28 DIAGNOSIS — I429 Cardiomyopathy, unspecified: Secondary | ICD-10-CM

## 2016-06-28 DIAGNOSIS — R0602 Shortness of breath: Secondary | ICD-10-CM

## 2016-06-28 MED ORDER — LISINOPRIL 20 MG PO TABS: 20.0000 mg | ORAL_TABLET | Freq: Every day | ORAL | 11 refills | Status: DC

## 2016-06-28 NOTE — Telephone Encounter (Signed)
Spoke with hochrein/nurse she states to hold metoprolol and to refer to letter given to pt when ordered  Left detailed message with above message

## 2016-06-28 NOTE — Patient Instructions (Signed)
Medication Instructions:  INCREASE- Lisinopril 20 mg daily  Labwork: None Ordered  Testing/Procedures: Your physician has requested that you have a lexiscan myoview. For further information please visit [email or web address]. Please follow instruction sheet, as given.  Follow-Up: Your physician recommends that you schedule a follow-up appointment in: Next Available in [facility]   Any Other Special Instructions Will Be Listed Below (If Applicable).      HAPPY NEW YEARS   If you need a refill on your cardiac medications before your next appointment, please call your pharmacy.

## 2016-06-28 NOTE — Telephone Encounter (Signed)
Timothy Guerrero is calling because he is scheduled for a stress test and wants to know if he is supposed to take his medications or not? Please call. Thanks.

## 2016-06-29 ENCOUNTER — Telehealth: Payer: Self-pay

## 2016-06-29 ENCOUNTER — Ambulatory Visit (HOSPITAL_COMMUNITY)
Admission: RE | Admit: 2016-06-29 | Discharge: 2016-06-29 | Disposition: A | Discharge status: Home / Self Care | Payer: Commercial Managed Care - HMO | Source: Ambulatory Visit | Attending: Cardiology | Admitting: Cardiology

## 2016-06-29 DIAGNOSIS — E119 Type 2 diabetes mellitus without complications: Secondary | ICD-10-CM | POA: Insufficient documentation

## 2016-06-29 DIAGNOSIS — R0602 Shortness of breath: Secondary | ICD-10-CM | POA: Insufficient documentation

## 2016-06-29 DIAGNOSIS — I10 Essential (primary) hypertension: Secondary | ICD-10-CM | POA: Diagnosis not present

## 2016-06-29 DIAGNOSIS — Z87891 Personal history of nicotine dependence: Secondary | ICD-10-CM | POA: Insufficient documentation

## 2016-06-29 DIAGNOSIS — I429 Cardiomyopathy, unspecified: Secondary | ICD-10-CM | POA: Insufficient documentation

## 2016-06-29 DIAGNOSIS — J449 Chronic obstructive pulmonary disease, unspecified: Secondary | ICD-10-CM | POA: Insufficient documentation

## 2016-06-29 DIAGNOSIS — R5383 Other fatigue: Secondary | ICD-10-CM | POA: Insufficient documentation

## 2016-06-29 DIAGNOSIS — R9439 Abnormal result of other cardiovascular function study: Secondary | ICD-10-CM | POA: Insufficient documentation

## 2016-06-29 LAB — MYOCARDIAL PERFUSION IMAGING
LV dias vol: 149 mL (ref 62–150)
LV sys vol: 97 mL
Peak HR: 90 {beats}/min
Rest HR: 81 {beats}/min
SDS: 8
SRS: 5
SSS: 13
TID: 1.07

## 2016-06-29 MED ORDER — REGADENOSON 0.4 MG/5ML IV SOLN
0.4000 mg | Freq: Once | INTRAVENOUS | Status: AC
  Administered 2016-06-29: 0.4 mg via INTRAVENOUS

## 2016-06-29 MED ORDER — AMINOPHYLLINE 25 MG/ML IV SOLN
75.0000 mg | Freq: Once | INTRAVENOUS | Status: AC
  Administered 2016-06-29: 75 mg via INTRAVENOUS

## 2016-06-29 MED ORDER — TECHNETIUM TC 99M TETROFOSMIN IV KIT
10.5000 | PACK | Freq: Once | INTRAVENOUS | Status: AC | PRN
  Administered 2016-06-29: 10.5 via INTRAVENOUS
  Filled 2016-06-29: qty 11

## 2016-06-29 MED ORDER — TECHNETIUM TC 99M TETROFOSMIN IV KIT
32.0000 | PACK | Freq: Once | INTRAVENOUS | Status: AC | PRN
  Administered 2016-06-29: 32 via INTRAVENOUS
  Filled 2016-06-29: qty 32

## 2016-06-29 NOTE — Telephone Encounter (Signed)
Spoke with pt, aware of below recs.  Will call insurance and find covered alternatives, and call back with them. Will await call back.

## 2016-06-29 NOTE — Telephone Encounter (Signed)
-----   Message from Timothy Glazier, MD sent at 06/28/2016  2:20 PM EST ----- Patient reportedly cannot afford Anoro but feels better on it (compared with Spiriva). Please call him and see if he can get a list of inhalers from his insurance that they will cover and let me know.  Thanks.

## 2016-06-30 ENCOUNTER — Telehealth: Payer: Self-pay | Admitting: Physician Assistant

## 2016-06-30 ENCOUNTER — Telehealth: Payer: Self-pay | Admitting: Family Medicine

## 2016-06-30 NOTE — Telephone Encounter (Signed)
Patient aware that he has refills on rx.

## 2016-06-30 NOTE — Telephone Encounter (Signed)
Please call,question about his medicine.

## 2016-06-30 NOTE — Telephone Encounter (Signed)
Spoke w patient. Re: lasix, potassium   Per instructions when seeing Timothy Guerrero on 12/13, he was to take lasix (and potassium w this) until wt loss of 10 lbs. Does not appear he was to remain on this long term. He was 203 at that appt and 195 2 days ago in f/u w Timothy Guerrero. Instructed pt to continue these meds as written for goal fluid weight reduction but to then discontinue use - however, not to dispose of med. Will seek advice/reclarification on these instructions and any suggestions for future PRN use.

## 2016-06-30 NOTE — Telephone Encounter (Signed)
Pt of Dr Percival Spanish The Lasix is at a very low dose. He should continue this to manage the fluid. If his weight continues to decrease, take it every other day. Usually, your weight will stabilize and it won't continue to decrease. Thanks

## 2016-07-04 NOTE — Telephone Encounter (Signed)
Advice communicated to patient. He's aware to call if he notes future concerns w this dosing regimen, increase in fluid weight, etc.  He voiced understanding. Also notes he's waiting to hear about recent stress test findings (06/29/16)  Aware I will route for provider review.

## 2016-07-25 ENCOUNTER — Ambulatory Visit (INDEPENDENT_AMBULATORY_CARE_PROVIDER_SITE_OTHER): Payer: Commercial Managed Care - HMO | Admitting: Pharmacist

## 2016-07-25 ENCOUNTER — Encounter: Payer: Self-pay | Admitting: Pharmacist

## 2016-07-25 VITALS — BP 142/82 | HR 77 | Ht 73.0 in | Wt 192.0 lb

## 2016-07-25 DIAGNOSIS — Z0000 Encounter for general adult medical examination without abnormal findings: Secondary | ICD-10-CM

## 2016-07-25 DIAGNOSIS — E7800 Pure hypercholesterolemia, unspecified: Secondary | ICD-10-CM

## 2016-07-25 DIAGNOSIS — E118 Type 2 diabetes mellitus with unspecified complications: Secondary | ICD-10-CM

## 2016-07-25 DIAGNOSIS — D509 Iron deficiency anemia, unspecified: Secondary | ICD-10-CM | POA: Diagnosis not present

## 2016-07-25 MED ORDER — UMECLIDINIUM-VILANTEROL 62.5-25 MCG/INH IN AEPB: 1.0000 | INHALATION_SPRAY | Freq: Every day | RESPIRATORY_TRACT | 0 refills | Status: DC

## 2016-07-25 NOTE — Progress Notes (Signed)
Patient ID: Timothy Guerrero, male   DOB: [birth year], 76 y.o.   MRN: [id number]     Subjective:   Timothy Guerrero is a 76 y.o. male who presents for a Subsequent Medicare Annual Wellness Visit.  Patient reports that he is doing well with regards to his health.  He has not been going to gym as much lately but hope to get back to this in the new year.  He does dance 3 times a week.  He is also active socially. He often spends time with his grandson and attends [employer] with him.    Social History: Occupational history: truck driver - overnight Marital history: divorced; has girlfriend Lives alone Alcohol/Tobacco/Substances: past tobacco use but quit in 1988 Left handed    ** Medication concern - patient reports that Dr Jamison Neighbor changed Spiriva to [facility] but he was unable to afford Anoro.    Current Medications (verified) Outpatient Encounter Prescriptions as of 07/25/2016  Medication Sig  . ferrous sulfate 325 (65 FE) MG tablet Take 1 tablet (325 mg total) by mouth 2 (two) times daily with a meal.  . furosemide (LASIX) 20 MG tablet Take 1 tablet (20 mg total) by mouth daily.  [name] glipiZIDE (GLUCOTROL) 10 MG tablet TAKE 1 TABLET EVERY DAY  . Insulin Glargine (LANTUS SOLOSTAR) 100 UNIT/ML Solostar Pen Inject 20-40 Units into the skin daily at 10 pm.  . levothyroxine (SYNTHROID, LEVOTHROID) 50 MCG tablet TAKE 1 TABLET EVERY DAY  . lisinopril (PRINIVIL,ZESTRIL) 20 MG tablet Take 1 tablet (20 mg total) by mouth daily.  . metFORMIN (GLUCOPHAGE) 1000 MG tablet Take 1 tablet (1,000 mg total) by mouth 2 (two) times daily.  . metoprolol succinate (TOPROL-XL) 100 MG 24 hr tablet Take 100 mg by mouth daily.   [name] omeprazole (PRILOSEC) 20 MG capsule TAKE 1 CAPSULE EVERY DAY  . pantoprazole (PROTONIX) 40 MG tablet Take 1 tablet (40 mg total) by mouth as directed. Take 40 mg twice daily for 1 week then decrease to 40 mg once a dady  . potassium chloride (K-DUR) 10 MEQ tablet Take 1 tablet (10 mEq  total) by mouth daily. Take only on days when taking lasix  . tiotropium (SPIRIVA) 18 MCG inhalation capsule Place 18 mcg into inhaler and inhale daily.  . valACYclovir (VALTREX) 1000 MG tablet TAKE 2 TABLETS TWICE DAILY  FOR  1  DAY  FOR  COLD  SORES  . Alum & Mag Hydroxide-Simeth (GI COCKTAIL) SUSP suspension Take 30 mLs by mouth as directed. Follow directions on bottle.Marland KitchenMarland KitchenShake well. (Patient not taking: Reported on 07/25/2016)  . sucralfate (CARAFATE) 1 GM/10ML suspension Take 10 mLs (1 g total) by mouth 2 (two) times daily. Take before breakfast and at bedtime (Patient not taking: Reported on 07/25/2016)  . umeclidinium-vilanterol (ANORO ELLIPTA) 62.5-25 MCG/INH AEPB Inhale 1 puff into the lungs daily.  . [DISCONTINUED] umeclidinium-vilanterol (ANORO ELLIPTA) 62.5-25 MCG/INH AEPB Inhale 1 puff into the lungs daily. (Patient not taking: Reported on 07/25/2016)   No facility-administered encounter medications on file as of 07/25/2016.     Allergies (verified) Patient has no known allergies.   History: Past Medical History:  Diagnosis Date  . Anemia   . Arthritis   . Atrial flutter (HCC)    post op following lobectomy  . Benign essential tremor   . Blood transfusion without reported diagnosis   . Cataract   . CHF (congestive heart failure) (HCC)   . Colon polyp    2007, polyp with early  cancer, one year f/u no residual polyp. next TCS 2010, see PSH. [facility].  [name] COPD (chronic obstructive pulmonary disease) ([place])   . Diabetes mellitus without complication ([place])    10 years  . GERD (gastroesophageal reflux disease)   . HOH (hard of hearing)   . Hypercholesteremia   . Hypertension    10 years  . Lung cancer ([place])   . Prostate cancer ([place])   . Shortness of breath dyspnea    due to lung mass  . Skin melanoma ([place])   . Sleep apnea    wears CPAP sometimes, cannot tolerate all the time.   Past Surgical History:  Procedure Laterality Date  . bottom lobe of right lung  removed  12/17/14  . CARDIAC CATHETERIZATION     no PCI  . CATARACT EXTRACTION Left   . CATARACT EXTRACTION W/PHACO Left 01/03/2016   Procedure: CATARACT EXTRACTION PHACO AND INTRAOCULAR LENS PLACEMENT LEFT EYE CDE=7.78;  Surgeon: Williams Che, MD;  Location: [facility];  Service: Ophthalmology;  Laterality: Left;  . CATARACT EXTRACTION W/PHACO Right 03/20/2016   Procedure: CATARACT EXTRACTION PHACO AND INTRAOCULAR LENS PLACEMENT; CDE:  9.58;  Surgeon: Williams Che, MD;  Location: [facility];  Service: Ophthalmology;  Laterality: Right;  . COLONOSCOPY  08/2008   Dr. Leafy Half, [place]: normal, internal grade 1 hemorrhoids. next TCS 08/2013  . COLONOSCOPY N/A 03/04/2014   Procedure: COLONOSCOPY;  Surgeon: Daneil Dolin, MD;  [facility];  Service: Endoscopy;  Laterality: N/A;  7:30am  . EYE SURGERY    . knee replacements     bilateral. over 10 years ago  . LOBECTOMY Right 12/17/2014   Procedure: RIGHT LOWER LOBECTOMY;  Surgeon: Melrose Nakayama, MD;  Location: [place];  Service: Thoracic;  Laterality: Right;  . LYMPH NODE DISSECTION Right 12/17/2014   Procedure: LYMPH NODE DISSECTION;  Surgeon: Melrose Nakayama, MD;  Location: [place];  Service: Thoracic;  Laterality: Right;  . NECK SURGERY     melanoma removed.  [name] PROSTATE SURGERY     prostatectomy, radical  . TOTAL HIP ARTHROPLASTY  2007   left  . total shoulder replacement  2011   left  . VIDEO ASSISTED THORACOSCOPY (VATS)/WEDGE RESECTION Right 12/17/2014   Procedure: RIGHT VIDEO ASSISTED THORACOSCOPY (VATS);  Surgeon: Melrose Nakayama, MD;  Location: [place];  Service: Thoracic;  Laterality: Right;   Family History  Problem Relation Age of Onset  . Cancer Mother     unknown type  . COPD Sister   . Breast cancer Sister   . Cancer Sister 35    breast  . Prostate cancer Son     died age 61  . Heart attack Daughter   . Colon cancer Neg Hx    Social History   Occupational History  . retired      Social History Main Topics  . Smoking status: Former Smoker    Packs/day: 2.00    Years: 32.00    Types: Cigarettes    Quit date: 07/03/1986  . Smokeless tobacco: Never Used  . Alcohol use No     Comment: rarely wine  . Drug use: No  . Sexual activity: Yes    Do you feel safe at home?  Yes Are there smokers in your home (other than you)? No  Dietary issues and exercise activities discussed: Current Exercise Habits: The patient does not participate in regular exercise at present  Current  Dietary habits:  Follows CHO counting diet   Cardiac Risk Factors include: advanced age (>94men, >31 women);diabetes mellitus;dyslipidemia;family history of premature cardiovascular disease;hypertension;male gender;sedentary lifestyle  Objective:    Today's Vitals   07/25/16 0933 07/25/16 0947  BP: (!) 152/94 (!) 142/82  Pulse: 74 77  Weight: 192 lb (87.1 kg)   Height: 6\' 1"  (1.854 m)   PainSc:  2   PainLoc:  Knee   Body mass index is 25.33 kg/m.   Last A1c = 7.3% (05/29/2016)   Activities of Daily Living In your present state of health, do you have any difficulty performing the following activities: 07/25/2016 03/14/2016  Hearing? Y N  Vision? N Y  Difficulty concentrating or making decisions? N N  Walking or climbing stairs? N N  Dressing or bathing? N N  Doing errands, shopping? N N  Preparing Food and eating ? N -  Using the Toilet? N -  In the past six months, have you accidently leaked urine? N -  Do you have problems with loss of bowel control? N -  Managing your Medications? N -  Managing your Finances? N -  Housekeeping or managing your Housekeeping? N -  Some recent data might be hidden     Depression Screen PHQ 2/9 Scores 07/25/2016 05/29/2016 05/18/2016 04/24/2016  PHQ - 2 Score 0 0 0 0  PHQ- 9 Score - - - -     Fall Risk Fall Risk  07/25/2016 05/29/2016 05/18/2016 04/24/2016 04/11/2016  Falls in the past year? No No No No No  Number falls in past yr: - - -  - -  Injury with Fall? - - - - -   Diabetic Foot Form - Detailed   Diabetic Foot Exam - detailed Diabetic Foot exam was performed with the following findings:  Yes 07/25/2016  9:42 AM  Visual Foot Exam completed.:  Yes  Is there a history of foot ulcer?:  No Can the patient see the bottom of their feet?:  Yes Are the shoes appropriate in style and fit?:  Yes Is there swelling or and abnormal foot shape?:  No Are the toenails long?:  No Are the toenails thick?:  No Do you have pain in calf while walking?:  No Is there a claw toe deformity?:  Yes Is there elevated skin temparature?:  No Is there limited skin dorsiflexion?:  No Is there foot or ankle muscle weakness?:  No Are the toenails ingrown?:  No Normal Range of Motion:  Yes Pulse Foot Exam completed.:  Yes  Right posterior Tibialias:  Present Left posterior Tibialias:  Present  Right Dorsalis Pedis:  Present Left Dorsalis Pedis:  Present  Sensory Foot Exam Completed.:  Yes Semmes-Weinstein Monofilament Test R Foot Test Control:  Pos L Foot Test Control:  Pos  R Site 1-Great Toe:  Pos L Site 1-Great Toe:  Neg  R Site 4:  Pos L Site 4:  Neg  R Site 5:  Pos L Site 5:  Neg        Cognitive Function: MMSE - Mini Mental State Exam 07/25/2016 05/13/2015  Orientation to time 5 5  Orientation to Place 5 5  Registration 3 3  Attention/ Calculation 3 5  Recall 3 3  Language- name 2 objects 2 2  Language- repeat 1 1  Language- follow 3 step command 1 3  Language- read & follow direction 1 1  Write a sentence 1 0  Copy design 1 0  Copy design-comments - tremor  make it difficult for patient to draw and write  Total score 26 28    Immunizations and Health Maintenance Immunization History  Administered Date(s) Administered  . Influenza, High Dose Seasonal PF 04/19/2016  . Influenza,inj,Quad PF,36+ Mos 03/31/2015  . Pneumococcal Conjugate-13 07/30/2015  . Tdap 04/24/2016   Health Maintenance Due  Topic Date Due  .  ZOSTAVAX  05/24/2001  . FOOT EXAM  04/28/2016    Patient Care Team: [name], MD as PCP - General (Family Medicine) Daneil Dolin, MD as Consulting Physician (Gastroenterology) Amy Martinique, MD as Consulting Physician (Dermatology) Minus Breeding, MD as Consulting Physician (Cardiology) Javier Glazier, MD as Consulting Physician (Pulmonary Disease)  Indicate any recent Medical Services you may have received from other than [facility] providers in the past year (date may be approximate).    Assessment:    Annual Wellness Visit  Medication Management - need to check preferred alternative to Anoro that is covered by [facility]   Screening Tests Health Maintenance  Topic Date Due  . ZOSTAVAX  05/24/2001  . FOOT EXAM  04/28/2016  . PNA vac Low Risk Adult (2 of 2 - PPSV23) 07/29/2016  . HEMOGLOBIN A1C  11/26/2016  . OPHTHALMOLOGY EXAM  12/01/2016  . COLONOSCOPY  03/04/2024  . TETANUS/TDAP  04/24/2026  . INFLUENZA VACCINE  Completed        Plan:   During the course of the visit Jehiel was educated and counseled about the following appropriate screening and preventive services:   Vaccines to include Pneumoccal, Influenza,  Td, Zostavax - patient is UTD on vaccines except Zostavax however we are going to wait until new shingles vaccine Shingrix is available  Colorectal cancer screening - UTD  Cardiovascular disease screening - has seen Dr Jenkins [place] within the last 6 months - Stress test, ECHO and EKG are all UTD  Last lipids profile showed increased LDL - checking lipids today   Diabetes - last A1c was good;  Patient to continue current meds for DM  Checked [facility] formulary - preferred agent is Stiolto.  I sent message with this information to his pulmonologist for review. In the meantime I gave patient #14 days of Anoro to use until decision is made  Drug therapy duplication - patient has both pantoprazole and omeprazole on medication profile.  He is instruction to check which he  has at home.  He is to take EITHER of these meds, not both.  Glaucoma screening / Diabetic Eye Exam - UTD  Foot exam performed today - NML  Nutrition counseling - continue to limit CHO intake and follow CHO counting diet.  Advanced Directives - patietn reminded to bring in copy for records  Physical Activity - plans to go to [facility] - goal is 150 minutes per week.  Orders Placed This Encounter  Procedures  . Microalbumin / creatinine urine ratio  . Lipid panel  . CMP14+EGFR  . CBC with Differential/Platelet  . Vitamin B12    Patient Instructions (the written plan) were given to the patient.   Cherre Robins, PharmD   07/25/2016

## 2016-07-25 NOTE — Patient Instructions (Addendum)
  Timothy Guerrero , Thank you for taking time to come for your Medicare Wellness Visit. I appreciate your ongoing commitment to your health goals. Please review the following plan we discussed and let me know if I can assist you in the future.   These are the goals we discussed:  Check which medication for reflux you have at home - pantoprazole or omeprazole. You should be taking either of these not both.  We will give shingles vaccines once we receive the new vaccine in our office - Shingrix.     Take furosemide in morning not evening to prevent getting up at night to use bathroom  Look for copy of [place] (important to know where these are kept - you can also bring copy to our office to be placed in our file / electronic chart)  Restart exercise - consider [employer] or [employer] program  This is a list of the screening recommended for you and due dates:  Health Maintenance  Topic Date Due  . Shingles Vaccine  05/24/2001  . Complete foot exam   04/28/2016 - [facility] today  . Pneumonia vaccines (2 of 2 - PPSV23) 07/29/2016  . Hemoglobin A1C  11/26/2016  . Eye exam for diabetics  12/01/2016  . Colon Cancer Screening  03/04/2024  . Tetanus Vaccine  04/24/2026  . Flu Shot  Completed

## 2016-07-26 LAB — LIPID PANEL
Chol/HDL Ratio: 4.2 ratio units (ref 0.0–5.0)
Cholesterol, Total: 176 mg/dL (ref 100–199)
HDL: 42 mg/dL (ref >39)
LDL Calculated: 109 mg/dL — ABNORMAL HIGH (ref 0–99)
Triglycerides: 125 mg/dL (ref 0–149)
VLDL Cholesterol Cal: 25 mg/dL (ref 5–40)

## 2016-07-26 LAB — CMP14+EGFR
ALT: 12 IU/L (ref 0–44)
AST: 16 IU/L (ref 0–40)
Albumin/Globulin Ratio: 1.7 (ref 1.2–2.2)
Albumin: 4 g/dL (ref 3.5–4.8)
Alkaline Phosphatase: 65 IU/L (ref 39–117)
BUN/Creatinine Ratio: 13 (ref 10–24)
BUN: 11 mg/dL (ref 8–27)
Bilirubin Total: 0.4 mg/dL (ref 0.0–1.2)
CO2: 25 mmol/L (ref 18–29)
Calcium: 10.3 mg/dL — ABNORMAL HIGH (ref 8.6–10.2)
Chloride: 104 mmol/L (ref 96–106)
Creatinine, Ser: 0.86 mg/dL (ref 0.76–1.27)
GFR calc Af Amer: 98 mL/min/{1.73_m2} (ref >59)
GFR calc non Af Amer: 85 mL/min/{1.73_m2} (ref >59)
Globulin, Total: 2.3 g/dL (ref 1.5–4.5)
Glucose: 76 mg/dL (ref 65–99)
Potassium: 4 mmol/L (ref 3.5–5.2)
Sodium: 148 mmol/L — ABNORMAL HIGH (ref 134–144)
Total Protein: 6.3 g/dL (ref 6.0–8.5)

## 2016-07-26 LAB — CBC WITH DIFFERENTIAL/PLATELET
Basophils Absolute: 0 10*3/uL (ref 0.0–0.2)
Basos: 1 %
EOS (ABSOLUTE): 0.3 10*3/uL (ref 0.0–0.4)
Eos: 4 %
Hematocrit: 47.2 % (ref 37.5–51.0)
Hemoglobin: 14.3 g/dL (ref 13.0–17.7)
Immature Grans (Abs): 0 10*3/uL (ref 0.0–0.1)
Immature Granulocytes: 0 %
Lymphocytes Absolute: 1.7 10*3/uL (ref 0.7–3.1)
Lymphs: 21 %
MCH: 25.4 pg — ABNORMAL LOW (ref 26.6–33.0)
MCHC: 30.3 g/dL — ABNORMAL LOW (ref 31.5–35.7)
MCV: 84 fL (ref 79–97)
Monocytes Absolute: 0.7 10*3/uL (ref 0.1–0.9)
Monocytes: 8 %
Neutrophils Absolute: 5.5 10*3/uL (ref 1.4–7.0)
Neutrophils: 66 %
Platelets: 222 10*3/uL (ref 150–379)
RBC: 5.63 x10E6/uL (ref 4.14–5.80)
RDW: 23.8 % — ABNORMAL HIGH (ref 12.3–15.4)
WBC: 8.2 10*3/uL (ref 3.4–10.8)

## 2016-07-26 LAB — MICROALBUMIN / CREATININE URINE RATIO
Creatinine, Urine: 198.8 mg/dL
Microalb/Creat Ratio: 14.4 mg/g creat (ref 0.0–30.0)
Microalbumin, Urine: 28.7 ug/mL

## 2016-07-26 LAB — VITAMIN B12: Vitamin B-12: 273 pg/mL (ref 232–1245)

## 2016-07-27 ENCOUNTER — Other Ambulatory Visit: Payer: Self-pay

## 2016-08-03 ENCOUNTER — Telehealth: Payer: Self-pay | Admitting: Family Medicine

## 2016-08-03 MED ORDER — METFORMIN HCL 1000 MG PO TABS: 1000.0000 mg | ORAL_TABLET | Freq: Two times a day (BID) | ORAL | 0 refills | Status: DC

## 2016-08-03 NOTE — Telephone Encounter (Signed)
Patient aware we do not have samples and rx sent to the drug store for patient.

## 2016-08-06 NOTE — Progress Notes (Signed)
Cardiology Office Note   Date:  08/09/2016   ID:  Myers, [place] 10/13/1940, MRN [id number]  PCP:  Kenn File, MD  Cardiologist:   Minus Breeding, MD   Chief Complaint  Patient presents with  . Atrial Flutter      History of Present Illness: Timothy Guerrero is a 76 y.o. male who presents for follow-up of atrial flutter. He was doing this paroxysmally in the hospital when he was admitted for lobectomy for non-small cell lung cancer. We started him on anticoagulation. Atrial Fibrillation Clinic did not think ablation was indicated. In the past he's had nonobstructive disease and small vessel disease with high-grade stenosis and a nondominant right coronary artery on cath in 2013 in the [place]. He had a stress test in 2015 which was negative. He's been managed medically. He's had a well preserved ejection fraction.  He has had continued problems with HTN and has had adjustments to his meds. In the fall he was having lightheadedness and shortness of breath and was found to be anemic. He was on anticoagulation at that time and this was stopped. He actually required transfusion. He felt better.  At the last visit he still had acute SOB.  An echo in December demonstrated an EF of 25 - 30%.   Perfusion study demonstrated an EF of 35% with a fixed medium sized mid inferior perfusion defect.  He returns to discuss this.  He says that since we last saw him he's not felt better. He is back to dancing. He wants to get back to the gym. He's not having any significant shortness of breath, PND or orthopnea. Denies any chest pressure, neck or arm discomfort. He's had no weight gain or edema.  Past Medical History:  Diagnosis Date  . Anemia   . Arthritis   . Atrial flutter ([place])    post op following lobectomy  . Benign essential tremor   . Blood transfusion without reported diagnosis   . Cataract   . CHF (congestive heart failure) ([place])   . Colon polyp    2007, polyp with early cancer, one year  f/u no residual polyp. next TCS 2010, see PSH. [facility].  [name] COPD (chronic obstructive pulmonary disease) ([place])   . Diabetes mellitus without complication ([place])    10 years  . GERD (gastroesophageal reflux disease)   . HOH (hard of hearing)   . Hypercholesteremia   . Hypertension    10 years  . Lung cancer ([place])   . Prostate cancer ([place])   . Shortness of breath dyspnea    due to lung mass  . Skin melanoma ([place])   . Sleep apnea    wears CPAP sometimes, cannot tolerate all the time.    Past Surgical History:  Procedure Laterality Date  . bottom lobe of right lung removed  12/17/14  . CARDIAC CATHETERIZATION     no PCI  . CATARACT EXTRACTION Left   . CATARACT EXTRACTION W/PHACO Left 01/03/2016   Procedure: CATARACT EXTRACTION PHACO AND INTRAOCULAR LENS PLACEMENT LEFT EYE CDE=7.78;  Surgeon: Williams Che, MD;  [facility];  Service: Ophthalmology;  Laterality: Left;  . CATARACT EXTRACTION W/PHACO Right 03/20/2016   Procedure: CATARACT EXTRACTION PHACO AND INTRAOCULAR LENS PLACEMENT; CDE:  9.58;  Surgeon: Williams Che, MD;  Location: [facility];  Service: Ophthalmology;  Laterality: Right;  . COLONOSCOPY  08/2008   Dr. Leafy Half, [place]: normal, internal grade  1 hemorrhoids. next TCS 08/2013  . COLONOSCOPY N/A 03/04/2014   Procedure: COLONOSCOPY;  Surgeon: Daneil Dolin, MD;  [facility];  Service: Endoscopy;  Laterality: N/A;  7:30am  . EYE SURGERY    . knee replacements     bilateral. over 10 years ago  . LOBECTOMY Right 12/17/2014   Procedure: RIGHT LOWER LOBECTOMY;  Surgeon: Melrose Nakayama, MD;  Location: [place];  Service: Thoracic;  Laterality: Right;  . LYMPH NODE DISSECTION Right 12/17/2014   Procedure: LYMPH NODE DISSECTION;  Surgeon: Melrose Nakayama, MD;  Location: [place];  Service: Thoracic;  Laterality: Right;  . NECK SURGERY     melanoma removed.  [name] PROSTATE SURGERY     prostatectomy, radical  . TOTAL HIP  ARTHROPLASTY  2007   left  . total shoulder replacement  2011   left  . VIDEO ASSISTED THORACOSCOPY (VATS)/WEDGE RESECTION Right 12/17/2014   Procedure: RIGHT VIDEO ASSISTED THORACOSCOPY (VATS);  Surgeon: Melrose Nakayama, MD;  Location: [place];  Service: Thoracic;  Laterality: Right;     Current Outpatient Prescriptions  Medication Sig Dispense Refill  . Alum & Mag Hydroxide-Simeth (GI COCKTAIL) SUSP suspension Take 30 mLs by mouth as directed. Follow directions on bottle.Marland KitchenMarland KitchenShake well. 240 mL 0  . ferrous sulfate 325 (65 FE) MG tablet Take 325 mg by mouth daily with breakfast.    . furosemide (LASIX) 20 MG tablet Take 1 tablet (20 mg total) by mouth daily. 10 tablet 0  . glipiZIDE (GLUCOTROL) 10 MG tablet Take 1 tablet (10 mg total) by mouth daily. 90 tablet 0  . Insulin Glargine (LANTUS SOLOSTAR) 100 UNIT/ML Solostar Pen Inject 20-40 Units into the skin daily at 10 pm. 5 pen PRN  . levothyroxine (SYNTHROID, LEVOTHROID) 50 MCG tablet TAKE 1 TABLET EVERY DAY 90 tablet 0  . lisinopril (PRINIVIL,ZESTRIL) 20 MG tablet Take 1 tablet (20 mg total) by mouth daily. 30 tablet 11  . metFORMIN (GLUCOPHAGE) 1000 MG tablet Take 1 tablet (1,000 mg total) by mouth 2 (two) times daily. 60 tablet 0  . metoprolol succinate (TOPROL-XL) 100 MG 24 hr tablet Take 1 tablet (100 mg total) by mouth daily. Pt to take 100 mg tablet daily and a 50 mg tablet daily for a total of 150 mg daily 90 tablet 3  . omeprazole (PRILOSEC) 20 MG capsule TAKE 1 CAPSULE EVERY DAY 90 capsule 5  . pantoprazole (PROTONIX) 40 MG tablet Take 1 tablet (40 mg total) by mouth as directed. Take 40 mg twice daily for 1 week then decrease to 40 mg once a dady 45 tablet 3  . potassium chloride (K-DUR) 10 MEQ tablet Take 1 tablet (10 mEq total) by mouth daily. Take only on days when taking lasix 90 tablet 3  . tiotropium (SPIRIVA) 18 MCG inhalation capsule Place 18 mcg into inhaler and inhale daily.    [name] umeclidinium-vilanterol (ANORO ELLIPTA)  62.5-25 MCG/INH AEPB Inhale 1 puff into the lungs daily. 14 each 0  . valACYclovir (VALTREX) 1000 MG tablet Take 1,000 mg by mouth as needed.    . metoprolol succinate (TOPROL-XL) 50 MG 24 hr tablet Take 1 tablet (50 mg total) by mouth daily. Take 50 mg tablet with a 100 mg tablet for a total of 150 mg daily. 90 tablet 3   No current facility-administered medications for this visit.     Allergies:   Patient has no known allergies.    ROS:  Please see the history of present illness.  Otherwise, review of systems are positive for none.   All other systems are reviewed and negative.    PHYSICAL EXAM: VS:  BP (!) 154/81 (BP Location: Right Arm, Patient Position: Sitting, Cuff Size: Normal)   Pulse 85   Wt 193 lb (87.5 kg)   SpO2 98%   BMI 25.46 kg/m  , BMI Body mass index is 25.46 kg/m. GENERAL:  Well appearing NECK:  No jugular venous distention, waveform within normal limits, carotid upstroke brisk and symmetric, no bruits, no thyromegaly LUNGS:  Clear to auscultation bilaterally BACK:  No CVA tenderness CHEST: Well healed right thoracotomy scar HEART:  PMI not displaced or sustained,S1 and S2 within normal limits, no S3, no S4, no clicks, no rubs, no murmurs ABD:  Flat, positive bowel sounds normal in frequency in pitch, no bruits, no rebound, no guarding, no midline pulsatile mass, no hepatomegaly, no splenomegaly EXT:  2 plus pulses throughout, no edema, no cyanosis no clubbing   EKG:  EKG is not He says that since then he's actually felt better ordered today.   Recent Labs: 05/17/2016: Hemoglobin 8.8 07/25/2016: ALT 12; BUN 11; Creatinine, Ser 0.86; Platelets 222; Potassium 4.0; Sodium 148    Lipid Panel    Component Value Date/Time   CHOL 176 07/25/2016 1006   TRIG 125 07/25/2016 1006   HDL 42 07/25/2016 1006   CHOLHDL 4.2 07/25/2016 1006   LDLCALC 109 (H) 07/25/2016 1006      Wt Readings from Last 3 Encounters:  08/09/16 193 lb (87.5 kg)  07/25/16 192 lb (87.1  kg)  06/29/16 195 lb (88.5 kg)      Other studies Reviewed: Additional studies/ records that were reviewed today include:  Perfusion study.  Review of the above records demonstrates:    See above.     ASSESSMENT AND PLAN:  ATRIAL FLUTTER:  I think it is very likely that he has paroxysmal flutter.  Mr. BERNARDINO DOWELL has a CHA2DS2 - VASc score of 4 with a risk of stroke of 4%.  He seems to be maintaining sinus rhythm. He had anemia which I suspect is GI blood loss and I sent a message to his primary care doctor to understand this better.  He will remain off anticoagulation.  HTN:  This is being managed in the context of treating his CHF  CAD:  As mentioned he has small vessel disease but no symptoms. I will continue to manage this medically.  I had a long discussion with him about the fact that it looks like he's had an inferior MI since his last stress test and now his ejection fraction is mildly reduced. However, because he feels so good manage this medically without diagnostic catheterization this point.  ACUTE SYSTOLIC HF:   He's not having any overt symptoms. I am going to increase his beta blocker to metoprolol 150 mg daily.  Current medicines are reviewed at length with the patient today.  The patient does have concerns regarding medicines.  The following changes have been made:  As above.   Labs/ tests ordered today include:   None  No orders of the defined types were placed in this encounter.    Disposition:   FU with me in 8 weeks.     Signed, Minus Breeding, MD  08/09/2016 6:46 PM    [facility]

## 2016-08-07 ENCOUNTER — Other Ambulatory Visit: Payer: Self-pay | Admitting: Family Medicine

## 2016-08-07 MED ORDER — GLIPIZIDE 10 MG PO TABS: 10.0000 mg | ORAL_TABLET | Freq: Every day | ORAL | 0 refills | Status: DC

## 2016-08-07 NOTE — Telephone Encounter (Signed)
done

## 2016-08-09 ENCOUNTER — Encounter: Payer: Self-pay | Admitting: Cardiology

## 2016-08-09 ENCOUNTER — Ambulatory Visit (INDEPENDENT_AMBULATORY_CARE_PROVIDER_SITE_OTHER): Payer: Medicare HMO | Admitting: Cardiology

## 2016-08-09 VITALS — BP 154/81 | HR 85 | Wt 193.0 lb

## 2016-08-09 DIAGNOSIS — I4892 Unspecified atrial flutter: Secondary | ICD-10-CM | POA: Diagnosis not present

## 2016-08-09 DIAGNOSIS — R9439 Abnormal result of other cardiovascular function study: Secondary | ICD-10-CM | POA: Diagnosis not present

## 2016-08-09 DIAGNOSIS — I5021 Acute systolic (congestive) heart failure: Secondary | ICD-10-CM | POA: Diagnosis not present

## 2016-08-09 MED ORDER — METOPROLOL SUCCINATE ER 50 MG PO TB24: 50.0000 mg | ORAL_TABLET | Freq: Every day | ORAL | 3 refills | Status: DC

## 2016-08-09 MED ORDER — METOPROLOL SUCCINATE ER 100 MG PO TB24: 100.0000 mg | ORAL_TABLET | Freq: Every day | ORAL | 3 refills | Status: DC

## 2016-08-09 NOTE — Patient Instructions (Signed)
Medication Instructions:  Please increase your Metoprolol succinate to 150 mg daily. Continue all other medications as listed.  Follow-Up: Follow up in 2 months with Dr Percival Spanish.  If you need a refill on your cardiac medications before your next appointment, please call your pharmacy.  Thank you for choosing [facility]!!

## 2016-08-10 ENCOUNTER — Encounter: Payer: Self-pay | Admitting: *Deleted

## 2016-08-14 ENCOUNTER — Other Ambulatory Visit: Payer: Self-pay | Admitting: *Deleted

## 2016-08-14 MED ORDER — METOPROLOL SUCCINATE ER 100 MG PO TB24: 100.0000 mg | ORAL_TABLET | Freq: Every day | ORAL | 3 refills | Status: DC

## 2016-08-14 MED ORDER — METOPROLOL SUCCINATE ER 50 MG PO TB24: 50.0000 mg | ORAL_TABLET | Freq: Every day | ORAL | 3 refills | Status: DC

## 2016-08-17 ENCOUNTER — Telehealth: Payer: Self-pay | Admitting: Cardiology

## 2016-08-17 NOTE — Telephone Encounter (Signed)
Spoke with pharmacy, clarified the patient is taking 150 mg of metoprolol= 1 of the 100 mg tablets with 1 of the 50 mg tablets once daily.

## 2016-08-17 NOTE — Telephone Encounter (Signed)
Laurene Footman, Education administrator at [facility] is calling to clarify the instructions for the metoprolol succinate (TOPROL-XL) 100 MG tablet. Please call at [phone]. Thanks

## 2016-08-28 ENCOUNTER — Encounter: Payer: Self-pay | Admitting: Family Medicine

## 2016-08-28 ENCOUNTER — Ambulatory Visit (INDEPENDENT_AMBULATORY_CARE_PROVIDER_SITE_OTHER): Payer: Medicare HMO

## 2016-08-28 ENCOUNTER — Ambulatory Visit (INDEPENDENT_AMBULATORY_CARE_PROVIDER_SITE_OTHER): Payer: Medicare HMO | Admitting: Family Medicine

## 2016-08-28 VITALS — BP 150/89 | HR 79 | Temp 96.6°F | Ht 73.0 in | Wt 195.0 lb

## 2016-08-28 DIAGNOSIS — R042 Hemoptysis: Secondary | ICD-10-CM | POA: Diagnosis not present

## 2016-08-28 DIAGNOSIS — R5383 Other fatigue: Secondary | ICD-10-CM | POA: Diagnosis not present

## 2016-08-28 DIAGNOSIS — C3491 Malignant neoplasm of unspecified part of right bronchus or lung: Secondary | ICD-10-CM

## 2016-08-28 NOTE — Progress Notes (Signed)
   HPI  Patient presents today here with fatigue and cough.  Patient explains that he has been coughing up fresh blood for 4-6 weeks now. Explains it's a small amount of blood with coughing on almost daily basis.  He denies any fever, chills, sweats.  Tolerating food and fluids normally. Besides hemoptysis he does not see another source of bleeding. He is breathing at baseline today.  He has fatigue similar to the last episode of severe anemia. PMH: Smoking status noted ROS: Per HPI  Objective: BP (!) 150/89   Pulse 79   Temp (!) 96.6 F (35.9 C) (Oral)   Ht '6\' 1"'$  (1.854 m)   Wt 195 lb (88.5 kg)   BMI 25.73 kg/m  Gen: NAD, alert, cooperative with exam HEENT: NCAT CV: RRR, good S1/S2, no murmur Resp: CTABL, no wheezes, non-labored Ext: No edema, warm Neuro: Alert and oriented, No gross deficits  DG chest: no acute changes  Assessment and plan:  # Fatigue Patient with recent history of severe and rapid anemia requiring transfusion, rechecking CBC today Patient compares his fatigue currently to that of the fatigue in November that he had.   # Hemoptysis, history of squamous cell lung cancer Patient with recent CT that was reassuring in December, however he has developed hemoptysis of fresh blood since that time. Plain film today is clear Basic labs as above, repeat CT chest It is still unclear where his previous bleed while this, we presumed that this was GI loss exacerbated by anticoagulation, however he is no longer anticoagulated and has been no blood loss except for hemoptysis. Consider hemothorax however no effusion today and pleural effusion last time not significant enough to account for amount of blood loss.  Orders Placed This Encounter  Procedures  . CT Chest W Contrast    Standing Status:   Future    Standing Expiration Date:   10/26/2017    Order Specific Question:   If indicated for the ordered procedure, I authorize the administration of contrast media  per Radiology protocol    Answer:   Yes    Order Specific Question:   Reason for Exam (SYMPTOM  OR DIAGNOSIS REQUIRED)    Answer:   Hx of lung ca, Hx of hemoptysis    Order Specific Question:   Preferred imaging location?    Answer:   [facility]  . DG Chest 2 View    Standing Status:   Future    Standing Expiration Date:   10/26/2017    Order Specific Question:   Reason for Exam (SYMPTOM  OR DIAGNOSIS REQUIRED)    Answer:   hemoptysis, Hx of lung Ca s/p resection    Order Specific Question:   Preferred imaging location?    Answer:   [facility]  . CBC with Differential/Platelet  . CMP14+EGFR    No orders of the defined types were placed in this encounter.   [name], MD [facility] 08/28/2016, 10:50 AM

## 2016-08-28 NOTE — Patient Instructions (Signed)
Great to see you!   

## 2016-08-29 LAB — CMP14+EGFR
ALT: 12 IU/L (ref 0–44)
AST: 13 IU/L (ref 0–40)
Albumin/Globulin Ratio: 1.7 (ref 1.2–2.2)
Albumin: 4.2 g/dL (ref 3.5–4.8)
Alkaline Phosphatase: 72 IU/L (ref 39–117)
BUN/Creatinine Ratio: 17 (ref 10–24)
BUN: 15 mg/dL (ref 8–27)
Bilirubin Total: 0.2 mg/dL (ref 0.0–1.2)
CO2: 28 mmol/L (ref 18–29)
Calcium: 9.8 mg/dL (ref 8.6–10.2)
Chloride: 98 mmol/L (ref 96–106)
Creatinine, Ser: 0.88 mg/dL (ref 0.76–1.27)
GFR calc Af Amer: 97 mL/min/{1.73_m2} (ref >59)
GFR calc non Af Amer: 84 mL/min/{1.73_m2} (ref >59)
Globulin, Total: 2.5 g/dL (ref 1.5–4.5)
Glucose: 295 mg/dL — ABNORMAL HIGH (ref 65–99)
Potassium: 4.2 mmol/L (ref 3.5–5.2)
Sodium: 140 mmol/L (ref 134–144)
Total Protein: 6.7 g/dL (ref 6.0–8.5)

## 2016-08-29 LAB — CBC WITH DIFFERENTIAL/PLATELET
Basophils Absolute: 0 10*3/uL (ref 0.0–0.2)
Basos: 0 %
EOS (ABSOLUTE): 0.3 10*3/uL (ref 0.0–0.4)
Eos: 4 %
Hematocrit: 47.7 % (ref 37.5–51.0)
Hemoglobin: 15.3 g/dL (ref 13.0–17.7)
Immature Grans (Abs): 0 10*3/uL (ref 0.0–0.1)
Immature Granulocytes: 0 %
Lymphocytes Absolute: 1.5 10*3/uL (ref 0.7–3.1)
Lymphs: 19 %
MCH: 28.1 pg (ref 26.6–33.0)
MCHC: 32.1 g/dL (ref 31.5–35.7)
MCV: 88 fL (ref 79–97)
Monocytes Absolute: 0.5 10*3/uL (ref 0.1–0.9)
Monocytes: 7 %
Neutrophils Absolute: 5.7 10*3/uL (ref 1.4–7.0)
Neutrophils: 70 %
Platelets: 246 10*3/uL (ref 150–379)
RBC: 5.45 x10E6/uL (ref 4.14–5.80)
RDW: 19.3 % — ABNORMAL HIGH (ref 12.3–15.4)
WBC: 8.1 10*3/uL (ref 3.4–10.8)

## 2016-09-01 ENCOUNTER — Ambulatory Visit (HOSPITAL_COMMUNITY)
Admission: RE | Admit: 2016-09-01 | Discharge: 2016-09-01 | Disposition: A | Discharge status: Home / Self Care | Payer: Medicare HMO | Source: Ambulatory Visit | Attending: Family Medicine | Admitting: Family Medicine

## 2016-09-01 DIAGNOSIS — J90 Pleural effusion, not elsewhere classified: Secondary | ICD-10-CM | POA: Diagnosis not present

## 2016-09-01 DIAGNOSIS — C3491 Malignant neoplasm of unspecified part of right bronchus or lung: Secondary | ICD-10-CM | POA: Diagnosis not present

## 2016-09-01 DIAGNOSIS — R042 Hemoptysis: Secondary | ICD-10-CM | POA: Diagnosis not present

## 2016-09-01 DIAGNOSIS — J948 Other specified pleural conditions: Secondary | ICD-10-CM | POA: Insufficient documentation

## 2016-09-01 DIAGNOSIS — Z902 Acquired absence of lung [part of]: Secondary | ICD-10-CM | POA: Diagnosis not present

## 2016-09-01 DIAGNOSIS — D3501 Benign neoplasm of right adrenal gland: Secondary | ICD-10-CM | POA: Diagnosis not present

## 2016-09-01 MED ORDER — IOPAMIDOL (ISOVUE-300) INJECTION 61%
100.0000 mL | Freq: Once | INTRAVENOUS | Status: AC | PRN
  Administered 2016-09-01: 75 mL via INTRAVENOUS

## 2016-09-08 ENCOUNTER — Ambulatory Visit (INDEPENDENT_AMBULATORY_CARE_PROVIDER_SITE_OTHER): Payer: Medicare HMO | Admitting: Pulmonary Disease

## 2016-09-08 ENCOUNTER — Encounter: Payer: Self-pay | Admitting: Pulmonary Disease

## 2016-09-08 VITALS — BP 130/80 | HR 85 | Ht 72.0 in | Wt 195.0 lb

## 2016-09-08 DIAGNOSIS — Z77090 Contact with and (suspected) exposure to asbestos: Secondary | ICD-10-CM

## 2016-09-08 DIAGNOSIS — J449 Chronic obstructive pulmonary disease, unspecified: Secondary | ICD-10-CM | POA: Diagnosis not present

## 2016-09-08 DIAGNOSIS — R042 Hemoptysis: Secondary | ICD-10-CM | POA: Diagnosis not present

## 2016-09-08 LAB — PULMONARY FUNCTION TEST
FEF 25-75 Post: 1.4 L/sec
FEF 25-75 Pre: 1.31 L/sec
FEF2575-%Change-Post: 6 %
FEF2575-%Pred-Post: 58 %
FEF2575-%Pred-Pre: 54 %
FEV1-%Change-Post: 1 %
FEV1-%Pred-Post: 79 %
FEV1-%Pred-Pre: 78 %
FEV1-Post: 2.63 L
FEV1-Pre: 2.6 L
FEV1FVC-%Change-Post: -2 %
FEV1FVC-%Pred-Pre: 84 %
FEV6-%Change-Post: 1 %
FEV6-%Pred-Post: 97 %
FEV6-%Pred-Pre: 96 %
FEV6-Post: 4.21 L
FEV6-Pre: 4.15 L
FEV6FVC-%Change-Post: -2 %
FEV6FVC-%Pred-Post: 103 %
FEV6FVC-%Pred-Pre: 105 %
FVC-%Change-Post: 3 %
FVC-%Pred-Post: 95 %
FVC-%Pred-Pre: 92 %
FVC-Post: 4.35 L
FVC-Pre: 4.21 L
Post FEV1/FVC ratio: 60 %
Post FEV6/FVC ratio: 97 %
Pre FEV1/FVC ratio: 62 %
Pre FEV6/FVC Ratio: 99 %

## 2016-09-08 NOTE — Patient Instructions (Signed)
   Call me if you start coughing up more blood or decide to go ahead with the bronchoscopy.  I will see you back in 3 months or sooner if needed.  Keep using your albuterol inhaler as needed for your coughing & breathing.

## 2016-09-08 NOTE — Progress Notes (Signed)
Pre and Post [name] done today. Timothy Guerrero

## 2016-09-08 NOTE — Progress Notes (Signed)
Subjective:    Patient ID: Timothy Guerrero, male    DOB: 1940/07/18, 76 y.o.   MRN: [id number]  C.C.:  Follow-up for Moderate COPD w/ Emphysema, Mild Restrictive Lung Disease, Multiple Lung Nodules, & Stage 1A NSCLC.  HPI Moderate COPD with emphysema: Patient given sample of Anoro to try in place of Spiriva at last appointment. He reports he couldn't tell a difference comparing the Spiriva and Anoro. He reports he has been coughing frequently and reports it seems to be worse. He reports more coughing with talking. Cough is still intermittently productive of a bloody mucus. This occurs after a severe coughing spell or early in the morning. He reports he is using his rescue inhaler intermittently and they do seem to help some.   Mild restrictive lung disease: No evidence or suggestion of interstitial lung disease on CT imaging. Likely due to pleural effusion and congestive heart failure.  Multiple lung nodules: Noted on CT imaging in June 2017 with no progression on December 2017 imaging. Underwent repeat CT imaging in March without signs of progression.  Right Lower Lobe Squamous Cell Carcinoma:  Stage pT1b, pN0 (Stage IA). Status post right lower lobectomy June 2016.  Review of Systems No other chest pain or pressure. No fever or chills. No weight loss. Denies any dysphagia or odynophagia.   No Known Allergies  Current Outpatient Prescriptions on File Prior to Visit  Medication Sig Dispense Refill  . Alum & Mag Hydroxide-Simeth (GI COCKTAIL) SUSP suspension Take 30 mLs by mouth as directed. Follow directions on bottle.Marland KitchenMarland KitchenShake well. 240 mL 0  . ferrous sulfate 325 (65 FE) MG tablet Take 325 mg by mouth daily with breakfast.    . furosemide (LASIX) 20 MG tablet Take 1 tablet (20 mg total) by mouth daily. 10 tablet 0  . glipiZIDE (GLUCOTROL) 10 MG tablet Take 1 tablet (10 mg total) by mouth daily. 90 tablet 0  . Insulin Glargine (LANTUS SOLOSTAR) 100 UNIT/ML Solostar Pen Inject 20-40 Units  into the skin daily at 10 pm. 5 pen PRN  . levothyroxine (SYNTHROID, LEVOTHROID) 50 MCG tablet TAKE 1 TABLET EVERY DAY 90 tablet 0  . lisinopril (PRINIVIL,ZESTRIL) 20 MG tablet Take 1 tablet (20 mg total) by mouth daily. 30 tablet 11  . metFORMIN (GLUCOPHAGE) 1000 MG tablet Take 1 tablet (1,000 mg total) by mouth 2 (two) times daily. 60 tablet 0  . metoprolol succinate (TOPROL-XL) 100 MG 24 hr tablet Take 1 tablet (100 mg total) by mouth daily. 90 tablet 3  . metoprolol succinate (TOPROL-XL) 50 MG 24 hr tablet Take 1 tablet (50 mg total) by mouth daily. 90 tablet 3  . omeprazole (PRILOSEC) 20 MG capsule TAKE 1 CAPSULE EVERY DAY 90 capsule 5  . pantoprazole (PROTONIX) 40 MG tablet Take 1 tablet (40 mg total) by mouth as directed. Take 40 mg twice daily for 1 week then decrease to 40 mg once a dady 45 tablet 3  . potassium chloride (K-DUR) 10 MEQ tablet Take 1 tablet (10 mEq total) by mouth daily. Take only on days when taking lasix 90 tablet 3  . tiotropium (SPIRIVA) 18 MCG inhalation capsule Place 18 mcg into inhaler and inhale daily.    [name] umeclidinium-vilanterol (ANORO ELLIPTA) 62.5-25 MCG/INH AEPB Inhale 1 puff into the lungs daily. 14 each 0  . valACYclovir (VALTREX) 1000 MG tablet Take 1,000 mg by mouth as needed.     No current facility-administered medications on file prior to visit.     Past Medical  History:  Diagnosis Date  . Anemia   . Arthritis   . Atrial flutter ([place])    post op following lobectomy  . Benign essential tremor   . Blood transfusion without reported diagnosis   . Cataract   . CHF (congestive heart failure) ([place])   . Colon polyp    2007, polyp with early cancer, one year f/u no residual polyp. next TCS 2010, see PSH. [facility].  [name] COPD (chronic obstructive pulmonary disease) ([place])   . Diabetes mellitus without complication ([place])    10 years  . GERD (gastroesophageal reflux disease)   . HOH (hard of hearing)   . Hypercholesteremia   .  Hypertension    10 years  . Lung cancer ([place])   . Prostate cancer ([place])   . Shortness of breath dyspnea    due to lung mass  . Skin melanoma ([place])   . Sleep apnea    wears CPAP sometimes, cannot tolerate all the time.    Past Surgical History:  Procedure Laterality Date  . bottom lobe of right lung removed  12/17/14  . CARDIAC CATHETERIZATION     no PCI  . CATARACT EXTRACTION Left   . CATARACT EXTRACTION W/PHACO Left 01/03/2016   Procedure: CATARACT EXTRACTION PHACO AND INTRAOCULAR LENS PLACEMENT LEFT EYE CDE=7.78;  Surgeon: Williams Che, MD;  Location: [facility];  Service: Ophthalmology;  Laterality: Left;  . CATARACT EXTRACTION W/PHACO Right 03/20/2016   Procedure: CATARACT EXTRACTION PHACO AND INTRAOCULAR LENS PLACEMENT; CDE:  9.58;  Surgeon: Williams Che, MD;  Location: [facility];  Service: Ophthalmology;  Laterality: Right;  . COLONOSCOPY  08/2008   Dr. Leafy Half, [place]: normal, internal grade 1 hemorrhoids. next TCS 08/2013  . COLONOSCOPY N/A 03/04/2014   Procedure: COLONOSCOPY;  Surgeon: Daneil Dolin, MD;  Location: [facility];  Service: Endoscopy;  Laterality: N/A;  7:30am  . EYE SURGERY    . knee replacements     bilateral. over 10 years ago  . LOBECTOMY Right 12/17/2014   Procedure: RIGHT LOWER LOBECTOMY;  Surgeon: Melrose Nakayama, MD;  Location: [facility];  Service: Thoracic;  Laterality: Right;  . LYMPH NODE DISSECTION Right 12/17/2014   Procedure: LYMPH NODE DISSECTION;  Surgeon: Melrose Nakayama, MD;  Location: [facility];  Service: Thoracic;  Laterality: Right;  . NECK SURGERY     melanoma removed.  [name] PROSTATE SURGERY     prostatectomy, radical  . TOTAL HIP ARTHROPLASTY  2007   left  . total shoulder replacement  2011   left  . VIDEO ASSISTED THORACOSCOPY (VATS)/WEDGE RESECTION Right 12/17/2014   Procedure: RIGHT VIDEO ASSISTED THORACOSCOPY (VATS);  Surgeon: Melrose Nakayama, MD;  Location: [facility];  Service: Thoracic;  Laterality: Right;     Family History  Problem Relation Age of Onset  . Cancer Mother     unknown type  . COPD Sister   . Breast cancer Sister   . Cancer Sister 53    breast  . Prostate cancer Son     died age 41  . Heart attack Daughter   . Colon cancer Neg Hx     Social History   Social History  . Marital status: Divorced    Spouse name: N/A  . Number of children: 3  . Years of education: N/A   Occupational History  . retired    Social History Main Topics  . Smoking status: Former Smoker  Packs/day: 2.00    Years: 32.00    Types: Cigarettes    Quit date: 07/03/1986  . Smokeless tobacco: Never Used  . Alcohol use No     Comment: rarely wine  . Drug use: No  . Sexual activity: Yes   Other Topics Concern  . None   Social History Engineer, materials.  Lives alone.      [facility]:   Originally from [facility] [place]. Served in [employer] with significant exposure to asbestos. 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 He has also lived in [place], [place], & [place]. He has lived in [place] since 1979. 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 No pets currently. Remote bird exposure.       Objective:   Physical Exam BP 130/80 (BP Location: Right Arm, Patient Position: Sitting, Cuff Size: Normal)   Pulse 85   Ht 6' (1.829 m)   Wt 195 lb (88.5 kg)   SpO2 97%   BMI 26.45 kg/m   Gen.: Awake. Alert. No acute distress. Integument: No rash or bruising on exposed skin. Warm and dry. HEENT: No scleral icterus. Moist mucous membranes. No nasal turbinate swelling. Cardiovascular: Regular rate. No edema. No appreciable JVD. Pulmonary:  Normal work of breathing on room air. Clear with auscultation. Abdomen:  Soft. Nontender. Normal bowel sounds.   PFT 09/08/16: FVC 4.21 L (92%) FEV1 2.60 L (70%) FEV1/FVC 0.62 FEF 25-75 1.31 L (84%) negative bronchodilator response 05/17/16: FVC 3.47 L (73%) FEV1 2.12 L (61%) FEV1/FVC 0.61 FEF 25-75 1.05 L (41%) positive bronchodilator response TLC 5.67 L (74%) RV 70% ERV 69% DLCO  uncorrected 43%  6MWT 06/13/16:  Walked 336 meters / Baseline Sat 98% on RA / Nadir Sat 97% on RA  IMAGING CT CHEST W/O 09/01/16 (per radiologist):  Status post right lower lobectomy without evidence of recurrent disease. Resolution of previously seen right-sided pleural effusion. Scattered calcified nodules consistent with prior granulomatous disease. There are a few scattered noncalcified or partially calcified nodules which are stable from multiple previous exams consistent with a benign etiology. Stable calcified pleural plaques.   CT CHEST W/O 06/08/16 (previously reviewed by me): Bilateral pleural plaques with some calcification. Subcentimeter pulmonary nodules again noted. Emphysema also again noted. Slight enlargement in right pleural effusion with small left pleural effusion. No pericardial effusion. Adrenal nodule also noted. No pathologic mediastinal adenopathy.  CT CHEST W/ 12/21/15 (previously reviewed by me): No pericardial effusion. Bilateral calcified pleural plaques. Trace right pleural effusion. Status post right lower lobectomy. Centrilobular and paraseptal emphysema that is apical predominant. 2 mm left upper lobe nodule stable since 2012. 3 mm left lower lobe nodule was not present on previous CT imaging. 1.6 cm right adrenal nodule consistent with imaging from 2012.  CARDIAC TTE (06/06/16): LV normal in size with mild LVH. EF 25-30% with diffuse hypokinesis. Grade 2 diastolic dysfunction. LA moderately dilated & RA normal in size. RV normal in size and function. No aortic stenosis or regurgitation. Aortic root normal in size. Mild to moderate mitral regurgitation without stenosis. No pulmonic stenosis. Trivial tricuspid regurgitation. No pericardial effusion.  PATHOLOGY Right Lower Lobectomy (12/17/14):  Right Pleural Biposy w/o malignancy but calcification present / 2.8 cm Invasive Poorly Differentiated Squamous Cell Carcinoma / Lymph Nodes Negative   LABS 05/15/16 CBC:   7.6/7.3/24.5/352 Ferritin:  6.6 Alpha-1 antitrypsin: MM (163)  03/14/16 CBC:  9.5/9.2/30.6/381    Assessment & Plan:  76 y.o. male status post right lower lobectomy with  squamous cell carcinoma in June 2016 with moderate COPD with emphysema & mild restrictive lung disease. Patient's ongoing hemoptysis is of concern. We did discuss the possibility that this could represent a malignancy not appreciated on his chest CT imaging. I did review his previous CT scan from June as well as his most recent CT scan explaining that the nodules have not changed in size. We also discussed the pleural thickening on the right which is likely due to his prior asbestos exposure and preceded his previous right lower lobectomy. The patient's spirometry has significantly improved since previous testing in the absence of significant bronchodilator response I do not feel that additional inhalers would be of great benefit. The patient will contact my office if he has any new breathing problems or wishes to proceed with bronchoscopy.   1. Moderate COPD with emphysema:  Continuing albuterol inhaler as needed. 2. Hemoptysis: Patient contemplating bronchoscopy with airway inspection. Discussed the risks of the procedure including bleeding, infection, pneumothorax, vocal cord injury, medication allergy, and potentially death. 3. Asbestos exposure: Patient does have some pleural plaque formation in his right lower lung preceding the surgery with associated calcification. 4. Mild restrictive lung disease: Multifactorial. No evidence of interstitial lung disease. 5. Right lower lobe squamous cell carcinoma: Status post right lower lobectomy June 2016. 6. Health maintenance: Status post influenza vaccine October 2017, Prevnar January 2017, & Tdap October 2017. 7. Follow-up: Return to clinic in 3 months or sooner if needed.  Sonia Baller Ashok Cordia, M.D. [facility] Pager:  [phone] After 3pm or if no response,  call [phone] 11:07 AM 09/08/16

## 2016-09-19 ENCOUNTER — Encounter: Payer: Self-pay | Admitting: Family Medicine

## 2016-09-19 ENCOUNTER — Ambulatory Visit (INDEPENDENT_AMBULATORY_CARE_PROVIDER_SITE_OTHER): Payer: Medicare HMO | Admitting: Family Medicine

## 2016-09-19 VITALS — BP 177/90 | HR 77 | Temp 97.7°F | Ht 73.0 in | Wt 199.2 lb

## 2016-09-19 DIAGNOSIS — I10 Essential (primary) hypertension: Secondary | ICD-10-CM | POA: Diagnosis not present

## 2016-09-19 MED ORDER — LISINOPRIL 30 MG PO TABS: 30.0000 mg | ORAL_TABLET | Freq: Every day | ORAL | 1 refills | Status: DC

## 2016-09-19 NOTE — Patient Instructions (Signed)
Great to see you!  Come back in 3-4 weeks  Increase lisinopril to 30 mg ( 1 and 1/2 pill of what you have), after your lasix has been in your system for a week or so you may need to go back to 20 mg lisinopril. If you do, just call and let us know  Our blood pressure goal for you in 120/80

## 2016-09-19 NOTE — Progress Notes (Signed)
   HPI  Patient presents today for follow-up for hypertension.  Patient states that about one week ago he stopped Lasix. He was advised to do this due to not having any additional pulmonary edema.  He gained 4 pounds in 4-5 days so he restarted Lasix 2-3 days ago. He states that he has not had any orthopnea, shortness of breath, or leg swelling.  He has had some mild headaches since his blood pressure has gone up.  He had blood pressure as high as 200/90 at home. No chest pain   PMH: Smoking status noted ROS: Per HPI  Objective: BP (!) 177/90   Pulse 77   Temp 97.7 F (36.5 C) (Oral)   Ht '6\' 1"'$  (1.854 m)   Wt 199 lb 3.2 oz (90.4 kg)   BMI 26.28 kg/m  Gen: NAD, alert, cooperative with exam HEENT: NCAT, EOMI, PERRL CV: RRR, good S1/S2, no murmur Resp: CTABL, no wheezes, non-labored Ext: No edema, warm Neuro: Alert and oriented, No gross deficits  Assessment and plan:  # HTN  elevated today, possibly due to stopping Lasix. Patient has restarted Lasix due to 4 pound weight gain. Increase lisinopril to 30 mg, explained to the patient that this could be transient. If he returns to 20 mg dose he will call so we can adjust his prescription. Discussed blood pressure goal of 120/80. Mild headaches, no other symptoms, no red flags.   Meds ordered this encounter  Medications  . lisinopril (PRINIVIL,ZESTRIL) 30 MG tablet    Sig: Take 1 tablet (30 mg total) by mouth daily.    Dispense:  90 tablet    Refill:  [place], MD [facility] 09/19/2016, 8:11 AM

## 2016-09-21 ENCOUNTER — Ambulatory Visit: Payer: Self-pay | Admitting: Family Medicine

## 2016-09-27 DIAGNOSIS — Z85820 Personal history of malignant melanoma of skin: Secondary | ICD-10-CM | POA: Diagnosis not present

## 2016-09-27 DIAGNOSIS — L821 Other seborrheic keratosis: Secondary | ICD-10-CM | POA: Diagnosis not present

## 2016-09-27 DIAGNOSIS — Z85828 Personal history of other malignant neoplasm of skin: Secondary | ICD-10-CM | POA: Diagnosis not present

## 2016-10-03 ENCOUNTER — Encounter: Payer: Self-pay | Admitting: Cardiology

## 2016-10-04 ENCOUNTER — Telehealth: Payer: Self-pay | Admitting: Family Medicine

## 2016-10-05 ENCOUNTER — Ambulatory Visit (INDEPENDENT_AMBULATORY_CARE_PROVIDER_SITE_OTHER): Payer: Medicare HMO | Admitting: Nurse Practitioner

## 2016-10-05 ENCOUNTER — Encounter: Payer: Self-pay | Admitting: Nurse Practitioner

## 2016-10-05 VITALS — BP 165/74 | HR 76 | Temp 96.8°F | Ht 73.0 in | Wt 201.0 lb

## 2016-10-05 DIAGNOSIS — W57XXXA Bitten or stung by nonvenomous insect and other nonvenomous arthropods, initial encounter: Secondary | ICD-10-CM

## 2016-10-05 DIAGNOSIS — S40922A Unspecified superficial injury of left upper arm, initial encounter: Secondary | ICD-10-CM | POA: Diagnosis not present

## 2016-10-05 MED ORDER — DOXYCYCLINE MONOHYDRATE 100 MG PO CAPS: 100.0000 mg | ORAL_CAPSULE | Freq: Two times a day (BID) | ORAL | 0 refills | Status: AC

## 2016-10-05 NOTE — Patient Instructions (Signed)
Tick Bite Information Introduction Ticks are insects that attach themselves to the skin. There are many types of ticks. Common types include wood ticks and deer ticks. Sometimes, ticks carry diseases that can make a person very ill. The most common places for ticks to attach themselves are the scalp, neck, armpits, waist, and groin. HOW CAN YOU PREVENT TICK BITES? Take these steps to help prevent tick bites when you are outdoors:  Wear long sleeves and long pants.  Wear white clothes so you can see ticks more easily.  Tuck your pant legs into your socks.  If walking on a trail, stay in the middle of the trail to avoid brushing against bushes.  Avoid walking through areas with long grass.  Put bug spray on all skin that is showing and along boot tops, pant legs, and sleeve cuffs.  Check clothes, hair, and skin often and before going inside.  Brush off any ticks that are not attached.  Take a shower or bath as soon as possible after being outdoors. HOW SHOULD YOU REMOVE A TICK? Ticks should be removed as soon as possible to help prevent diseases. 1. If latex gloves are available, put them on before trying to remove a tick. 2. Use tweezers to grasp the tick as close to the skin as possible. You may also use curved forceps or a tick removal tool. Grasp the tick as close to its head as possible. Avoid grasping the tick on its body. 3. Pull gently upward until the tick lets go. Do not twist the tick or jerk it suddenly. This may break off the tick's head or mouth parts. 4. Do not squeeze or crush the tick's body. This could force disease-carrying fluids from the tick into your body. 5. After the tick is removed, wash the bite area and your hands with soap and water or alcohol. 6. Apply a small amount of antiseptic cream or ointment to the bite site. 7. Wash any tools that were used. Do not try to remove a tick by applying a hot match, petroleum jelly, or fingernail polish to the tick. These  methods do not work. They may also increase the chances of disease being spread from the tick bite. WHEN SHOULD YOU SEEK HELP? Contact your health care provider if you are unable to remove a tick or if a part of the tick breaks off in the skin. After a tick bite, you need to watch for signs and symptoms of diseases that can be spread by ticks. Contact your health care provider if you develop any of the following:  Fever.  Rash.  Redness and puffiness (swelling) in the area of the tick bite.  Tender, puffy lymph glands.  Watery poop (diarrhea).  Weight loss.  Cough.  Feeling more tired than normal (fatigue).  Muscle, joint, or bone pain.  Belly (abdominal) pain.  Headache.  Change in your level of consciousness.  Trouble walking or moving your legs.  Loss of feeling (numbness) in the legs.  Loss of movement (paralysis).  Shortness of breath.  Confusion.  Throwing up (vomiting) many times. This information is not intended to replace advice given to you by your health care provider. Make sure you discuss any questions you have with your health care provider. Document Released: 09/13/2009 Document Revised: 11/25/2015 Document Reviewed: 11/27/2012 Elsevier Interactive Patient Education  2017 [employer].

## 2016-10-05 NOTE — Telephone Encounter (Signed)
Pt scheduled for appt.

## 2016-10-05 NOTE — Progress Notes (Signed)
   Subjective:    Patient ID: Timothy Guerrero, male    DOB: 1941-01-11, 76 y.o.   MRN: [id number]  HPI: Patient presents with tick bite on left lateral bicep. Tick removed by the patient with suspected length of stay <1 day. Patient examined the tick and no red spot was noted.   Review of Systems  Constitutional: Negative.   Respiratory: Negative.   Cardiovascular: Negative.   Gastrointestinal: Negative.   Skin: Positive for wound.       Tick bite on left lateral bicep   Psychiatric/Behavioral: Negative.   All other systems reviewed and are negative.      Objective:   Physical Exam  Constitutional: He is oriented to person, place, and time. He appears well-developed and well-nourished.  Cardiovascular: Normal rate and regular rhythm.   Pulmonary/Chest: Effort normal and breath sounds normal.  Neurological: He is oriented to person, place, and time.  Skin: Skin is warm and dry.  1 mm erythematous papule noted to left lateral bicep without circular swelling.   Psychiatric: He has a normal mood and affect. His behavior is normal. Judgment and thought content normal.   BP (!) 165/74   Pulse 76   Temp (!) 96.8 F (36 C) (Oral)   Ht '6\' 1"'$  (1.854 m)   Wt 201 lb (91.2 kg)   BMI 26.52 kg/m      Assessment & Plan:  1. Tick bite, initial encounter Avoid scratching or picking RTO prn - doxycycline (MONODOX) 100 MG capsule; Take 1 capsule (100 mg total) by mouth 2 (two) times daily.  Dispense: 28 capsule; Refill: 0  Mary-Margaret Hassell Done, FNP

## 2016-10-09 ENCOUNTER — Other Ambulatory Visit: Payer: Self-pay | Admitting: Family Medicine

## 2016-10-12 ENCOUNTER — Ambulatory Visit: Payer: Medicare HMO | Admitting: Adult Health

## 2016-10-12 ENCOUNTER — Telehealth: Payer: Self-pay | Admitting: Pulmonary Disease

## 2016-10-12 DIAGNOSIS — R042 Hemoptysis: Secondary | ICD-10-CM

## 2016-10-12 NOTE — Telephone Encounter (Signed)
If the hemoptysis is that severe then he may need to be evaluated at the hospital and undergo bronchoscopy. He should probably go to the closest emergency department for an evaluation. If he is absolutely against this then please order a CBC, INR, PTT and CXR PA/LAT. He will need to be seen either today or tomorrow by an available provider. Please let me know what he decides. Thanks.

## 2016-10-12 NOTE — Telephone Encounter (Signed)
Spoke with pt, who refused to go to ED. Reports that hospitalist never do anything to help him. Pt has been scheduled for acute visit with MW for 10-13-16 @ 9:00 Labs and CXR have been ordered. Pt aware and voiced his understanding. nothing further needed.   Will route to JN as an [place].

## 2016-10-12 NOTE — Telephone Encounter (Signed)
Spoke with pt, who states his hemoptysis has worsen in the last week. Pt reports of coughing up blood 3-4x daily. Pt denies any other symptoms.  JN please advise. Thanks.

## 2016-10-13 ENCOUNTER — Ambulatory Visit (INDEPENDENT_AMBULATORY_CARE_PROVIDER_SITE_OTHER)
Admission: RE | Admit: 2016-10-13 | Discharge: 2016-10-13 | Disposition: A | Discharge status: Home / Self Care | Payer: Medicare HMO | Source: Ambulatory Visit | Attending: Pulmonary Disease | Admitting: Pulmonary Disease

## 2016-10-13 ENCOUNTER — Encounter: Payer: Self-pay | Admitting: Internal Medicine

## 2016-10-13 ENCOUNTER — Other Ambulatory Visit (INDEPENDENT_AMBULATORY_CARE_PROVIDER_SITE_OTHER): Payer: Medicare HMO

## 2016-10-13 ENCOUNTER — Ambulatory Visit (INDEPENDENT_AMBULATORY_CARE_PROVIDER_SITE_OTHER): Payer: Medicare HMO | Admitting: Internal Medicine

## 2016-10-13 VITALS — BP 162/84 | HR 80 | Temp 97.7°F | Ht 73.0 in | Wt 196.8 lb

## 2016-10-13 DIAGNOSIS — R05 Cough: Secondary | ICD-10-CM | POA: Diagnosis not present

## 2016-10-13 DIAGNOSIS — I10 Essential (primary) hypertension: Secondary | ICD-10-CM

## 2016-10-13 DIAGNOSIS — R042 Hemoptysis: Secondary | ICD-10-CM | POA: Diagnosis not present

## 2016-10-13 DIAGNOSIS — J449 Chronic obstructive pulmonary disease, unspecified: Secondary | ICD-10-CM | POA: Diagnosis not present

## 2016-10-13 DIAGNOSIS — R058 Other specified cough: Secondary | ICD-10-CM

## 2016-10-13 LAB — CBC WITH DIFFERENTIAL/PLATELET
Basophils Absolute: 0 10*3/uL (ref 0.0–0.1)
Basophils Relative: 0.5 % (ref 0.0–3.0)
Eosinophils Absolute: 0.3 10*3/uL (ref 0.0–0.7)
Eosinophils Relative: 4.1 % (ref 0.0–5.0)
HCT: 43.8 % (ref 39.0–52.0)
Hemoglobin: 14.5 g/dL (ref 13.0–17.0)
Lymphocytes Relative: 17 % (ref 12.0–46.0)
Lymphs Abs: 1.4 10*3/uL (ref 0.7–4.0)
MCHC: 33.2 g/dL (ref 30.0–36.0)
MCV: 88.8 fl (ref 78.0–100.0)
Monocytes Absolute: 0.9 10*3/uL (ref 0.1–1.0)
Monocytes Relative: 10.5 % (ref 3.0–12.0)
Neutro Abs: 5.7 10*3/uL (ref 1.4–7.7)
Neutrophils Relative %: 67.9 % (ref 43.0–77.0)
Platelets: 210 10*3/uL (ref 150.0–400.0)
RBC: 4.93 Mil/uL (ref 4.22–5.81)
RDW: 16.3 % — ABNORMAL HIGH (ref 11.5–15.5)
WBC: 8.3 10*3/uL (ref 4.0–10.5)

## 2016-10-13 LAB — PROTIME-INR
INR: 1 ratio (ref 0.8–1.0)
Prothrombin Time: 10.5 s (ref 9.6–13.1)

## 2016-10-13 LAB — APTT: aPTT: 28.1 s (ref 23.4–32.7)

## 2016-10-13 MED ORDER — IRBESARTAN 300 MG PO TABS: 300.0000 mg | ORAL_TABLET | Freq: Every day | ORAL | 11 refills | Status: DC

## 2016-10-13 MED ORDER — PANTOPRAZOLE SODIUM 40 MG PO TBEC: DELAYED_RELEASE_TABLET | ORAL | 3 refills | Status: DC

## 2016-10-13 NOTE — Progress Notes (Signed)
Subjective:    Patient ID: Timothy Guerrero, male    DOB: 1941-02-17    MRN: [id number]  Brief patient profile:  Moderate COPD w/ Emphysema, Mild Restrictive Lung Disease, Multiple Lung Nodules, & Stage 1A NSCLC.  09/08/16 ov / Timothy Guerrero Moderate COPD with emphysema: Patient given sample of Anoro to try in place of Spiriva at last appointment. He reports he couldn't tell a difference comparing the Spiriva and Anoro. He reports he has been coughing frequently and reports it seems to be worse. He reports more coughing with talking. Cough is still intermittently productive of a bloody mucus. This occurs after a severe coughing spell or early in the morning. He reports he is using his rescue inhaler intermittently and they do seem to help some.   Mild restrictive lung disease: No evidence or suggestion of interstitial lung disease on CT imaging. Likely due to pleural effusion and congestive heart failure.  Multiple lung nodules: Noted on CT imaging in June 2017 with no progression on December 2017 imaging. Underwent repeat CT imaging in March without signs of progression.  Right Lower Lobe Squamous Cell Carcinoma:  Stage pT1b, pN0 (Stage IA). Status post right lower lobectomy June 2016. recs  Call me if you start coughing up more blood or decide to go ahead with the bronchoscopy.  I will see you back in 3 months or sooner if needed.  Keep using your albuterol inhaler as needed for your coughing & breathing.     10/13/2016 acute extended ov/Timothy Guerrero re: cough ever since surgery 12/2014 d/c'd  on lisinopril at that point and on it ever since now with trace hemoptysis new onset x 2 weeks  Chief Complaint  Patient presents with  . Acute Visit    Increased hemoptysis x 1-2 wks- occurs 3-4 x per day. He states he occ has some right CP when he coughs.   cough ever since surgery / worse with voice use day > noct no better with dpi's  Minimal actual blood produced / does ok sleeping > most of mucus/ blood  is first thing in am  Very confused with med names/ instructions   No obvious patterns  day to day or daytime variability or assoc excess/ purulent sputum or mucus plugs  or cp or chest tightness, subjective wheeze or overt sinus or hb symptoms. No unusual exp hx or h/o childhood pna/ asthma or knowledge of premature birth.  Sleeping ok without nocturnal  or early am exacerbation  of respiratory  c/o's or need for noct saba. Also denies any obvious fluctuation of symptoms with weather or environmental changes or other aggravating or alleviating factors except as outlined above   Current Medications, Allergies, Complete Past Medical History, Past Surgical History, Family History, and Social History were reviewed in [employer] record.  ROS  The following are not active complaints unless bolded sore throat, dysphagia, dental problems, itching, sneezing,  nasal congestion or excess/ purulent secretions, ear ache,   fever, chills, sweats, unintended wt loss, classically pleuritic or exertional cp,  orthopnea pnd or leg swelling, presyncope, palpitations, abdominal pain, anorexia, nausea, vomiting, diarrhea  or change in bowel or bladder habits, change in stools or urine, dysuria,hematuria,  rash, arthralgias, visual complaints, headache, numbness, weakness or ataxia or problems with walking or coordination,  change in mood/affect or memory.                  Objective:   Physical Exam  Hoarse amb wm nad  Wt Readings from Last 3 Encounters:  10/13/16 196 lb 12.8 oz (89.3 kg)  10/05/16 201 lb (91.2 kg)  09/19/16 199 lb 3.2 oz (90.4 kg)    Vital signs reviewed  - Note on arrival 02 sats  97% on RA and not bp 162/84 p taking am lisinopril    HEENT: nl dentition, turbinates bilaterally, and oropharynx. Nl external ear canals without cough reflex   NECK :  without JVD/Nodes/TM/ nl carotid upstrokes bilaterally   LUNGS: no acc muscle use,  Nl contour chest which is  clear to A and P bilaterally without cough on insp or exp maneuvers   CV:  RRR  no s3 or murmur or increase in P2, and no edema   ABD:  soft and nontender with nl inspiratory excursion in the supine position. No bruits or organomegaly appreciated, bowel sounds nl  MS:  Nl gait/ ext warm without deformities, calf tenderness, cyanosis or clubbing No obvious joint restrictions   SKIN: warm and dry without lesions    NEURO:  alert, approp, nl sensorium with  no motor or cerebellar deficits apparent.      PFT 09/08/16: FVC 4.21 L (92%) FEV1 2.60 L (70%) FEV1/FVC 0.62 FEF 25-75 1.31 L (84%) negative bronchodilator response 05/17/16: FVC 3.47 L (73%) FEV1 2.12 L (61%) FEV1/FVC 0.61 FEF 25-75 1.05 L (41%) positive bronchodilator response TLC 5.67 L (74%) RV 70% ERV 69% DLCO uncorrected 43%  6MWT 06/13/16:  Walked 336 meters / Baseline Sat 98% on RA / Nadir Sat 97% on RA  IMAGING CT CHEST W/O 09/01/16 (per radiologist):  Status post right lower lobectomy without evidence of recurrent disease. Resolution of previously seen right-sided pleural effusion. Scattered calcified nodules consistent with prior granulomatous disease. There are a few scattered noncalcified or partially calcified nodules which are stable from multiple previous exams consistent with a benign etiology. Stable calcified pleural plaques.   CT CHEST W/O 06/08/16 (previously reviewed by me): Bilateral pleural plaques with some calcification. Subcentimeter pulmonary nodules again noted. Emphysema also again noted. Slight enlargement in right pleural effusion with small left pleural effusion. No pericardial effusion. Adrenal nodule also noted. No pathologic mediastinal adenopathy.  CT CHEST W/ 12/21/15 (previously reviewed by me): No pericardial effusion. Bilateral calcified pleural plaques. Trace right pleural effusion. Status post right lower lobectomy. Centrilobular and paraseptal emphysema that is apical predominant. 2 mm left  upper lobe nodule stable since 2012. 3 mm left lower lobe nodule was not present on previous CT imaging. 1.6 cm right adrenal nodule consistent with imaging from 2012.  CARDIAC TTE (06/06/16): LV normal in size with mild LVH. EF 25-30% with diffuse hypokinesis. Grade 2 diastolic dysfunction. LA moderately dilated & RA normal in size. RV normal in size and function. No aortic stenosis or regurgitation. Aortic root normal in size. Mild to moderate mitral regurgitation without stenosis. No pulmonic stenosis. Trivial tricuspid regurgitation. No pericardial effusion.  PATHOLOGY Right Lower Lobectomy (12/17/14):  Right Pleural Biposy w/o malignancy but calcification present / 2.8 cm Invasive Poorly Differentiated Squamous Cell Carcinoma / Lymph Nodes Negative   LABS 05/15/16 CBC:  7.6/7.3/24.5/352 Ferritin:  6.6 Alpha-1 antitrypsin: MM (163)  03/14/16 CBC:  9.5/9.2/30.6/381    CXR PA and Lateral:   10/13/2016 :    I personally reviewed images and agree with radiology impression as follows:   No active cardiopulmonary disease.      Assessment & Plan:

## 2016-10-13 NOTE — Patient Instructions (Addendum)
Stop lisinopril and start avapro 300 mg one daily and your cough should gradually improve over the next few weeks  If light headed when standing then reduce the avapro to one half daily   Stop spiriva   For cough > delsym 2 tsp every 12 hours as needed and don't take any aspirin until active bleeding stops   Protonix (pantoprazole) 40 mg Take 30- 60 min before your first and last meals of the day until you return  GERD (REFLUX)  is an extremely common cause of respiratory symptoms just like yours , many times with no obvious heartburn at all.    It can be treated with medication, but also with lifestyle changes including elevation of the head of your bed (ideally with 6 inch  bed blocks),  Smoking cessation, avoidance of late meals, excessive alcohol, and avoid fatty foods, chocolate, peppermint, colas, red wine, and acidic juices such as orange juice.  NO MINT OR MENTHOL PRODUCTS SO NO COUGH DROPS  USE SUGARLESS CANDY INSTEAD (Jolley ranchers or Stover's or Life Savers) or even ice chips will also do - the key is to swallow to prevent all throat clearing. NO OIL BASED VITAMINS - use powdered substitutes.    Only use your albuterol (proventil) as a rescue medication to be used if you can't catch your breath by resting or doing a relaxed purse lip breathing pattern.  - The less you use it, the better it will work when you need it. - Ok to use up to 2 puffs  every 4 hours if you must but call for immediate appointment if use goes up over your usual need - Don't leave home without it !!  (think of it like the spare tire for your car)    Please schedule a follow up office visit in 2weeks with Tammy NP , sooner if needed  with all medications /inhalers/ solutions in hand so we can verify exactly what you are taking. This includes all medications from all doctors and over the counters

## 2016-10-15 ENCOUNTER — Encounter: Payer: Self-pay | Admitting: Internal Medicine

## 2016-10-15 DIAGNOSIS — R05 Cough: Secondary | ICD-10-CM | POA: Insufficient documentation

## 2016-10-15 DIAGNOSIS — R058 Other specified cough: Secondary | ICD-10-CM | POA: Insufficient documentation

## 2016-10-15 NOTE — Progress Notes (Signed)
Cardiology Office Note   Date:  10/18/2016   ID:  Timothy Guerrero, DOB [birth year], MRN [id number]  PCP:  Kenn File, MD  Cardiologist:   Minus Breeding, MD   Chief Complaint  Patient presents with  . Cardiomyopathy      History of Present Illness: Timothy Guerrero is a 76 y.o. male who presents for follow-up of atrial flutter.  [facility] did not think ablation was indicated. In the past he's had nonobstructive disease and small vessel disease with high-grade stenosis and a nondominant right coronary artery on cath in 2013 in the [place]. He had a stress test in 2015 which was negative. He's been managed medically. He's had a well preserved ejection fraction.  He has had continued problems with HTN and has had adjustments to his meds. He has been anemic. He was on anticoagulation at that time and this was stopped. He actually required transfusion.   An echo in December demonstrated an EF of 25 - 30%.   Perfusion study demonstrated an EF of 35% with a fixed medium sized mid inferior perfusion defect.  He has remained off of anticoagulation because of his bleed.  At the last visit I increased his metoprolol.   He did well.  Timothy Guerrero took him of lisinopril and started ARB for cough.  He continues to cough with AM mild hemoptysis.  He does have some DOE with exertion but this is chronic.  The patient denies any new symptoms such as chest discomfort, neck or arm discomfort. There has been no new PND or orthopnea. There have been no reported palpitations, presyncope or syncope.   Past Medical History:  Diagnosis Date  . Anemia   . Arthritis   . Atrial flutter ([place])    post op following lobectomy  . Benign essential tremor   . Blood transfusion without reported diagnosis   . Cataract   . CHF (congestive heart failure) ([place])   . Colon polyp    2007, polyp with early cancer, one year f/u no residual polyp. next TCS 2010, see PSH. [facility].  [name] COPD (chronic  obstructive pulmonary disease) ([place])   . Diabetes mellitus without complication ([place])    10 years  . GERD (gastroesophageal reflux disease)   . HOH (hard of hearing)   . Hypercholesteremia   . Hypertension    10 years  . Lung cancer ([place])   . Prostate cancer ([place])   . Shortness of breath dyspnea    due to lung mass  . Skin melanoma ([place])   . Sleep apnea    wears CPAP sometimes, cannot tolerate all the time.    Past Surgical History:  Procedure Laterality Date  . bottom lobe of right lung removed  12/17/14  . CARDIAC CATHETERIZATION     no PCI  . CATARACT EXTRACTION Left   . CATARACT EXTRACTION W/PHACO Left 01/03/2016   Procedure: CATARACT EXTRACTION PHACO AND INTRAOCULAR LENS PLACEMENT LEFT EYE CDE=7.78;  Surgeon: Williams Che, MD;  Location: [facility];  Service: Ophthalmology;  Laterality: Left;  . CATARACT EXTRACTION W/PHACO Right 03/20/2016   Procedure: CATARACT EXTRACTION PHACO AND INTRAOCULAR LENS PLACEMENT; CDE:  9.58;  Surgeon: Williams Che, MD;  Location: [facility];  Service: Ophthalmology;  Laterality: Right;  . COLONOSCOPY  08/2008   Dr. Leafy Half, [place]: normal, internal grade 1 hemorrhoids. next TCS 08/2013  . COLONOSCOPY N/A 03/04/2014   Procedure: COLONOSCOPY;  Surgeon:  Daneil Dolin, MD;  Location: [facility];  Service: Endoscopy;  Laterality: N/A;  7:30am  . EYE SURGERY    . knee replacements     bilateral. over 10 years ago  . LOBECTOMY Right 12/17/2014   Procedure: RIGHT LOWER LOBECTOMY;  Surgeon: Melrose Nakayama, MD;  Location: [facility];  Service: Thoracic;  Laterality: Right;  . LYMPH NODE DISSECTION Right 12/17/2014   Procedure: LYMPH NODE DISSECTION;  Surgeon: Melrose Nakayama, MD;  Location: [facility];  Service: Thoracic;  Laterality: Right;  . NECK SURGERY     melanoma removed.  [name] PROSTATE SURGERY     prostatectomy, radical  . TOTAL HIP ARTHROPLASTY  2007   left  . total shoulder replacement  2011   left  . VIDEO ASSISTED  THORACOSCOPY (VATS)/WEDGE RESECTION Right 12/17/2014   Procedure: RIGHT VIDEO ASSISTED THORACOSCOPY (VATS);  Surgeon: Melrose Nakayama, MD;  Location: [facility];  Service: Thoracic;  Laterality: Right;     Current Outpatient Prescriptions  Medication Sig Dispense Refill  . Alum & Mag Hydroxide-Simeth (GI COCKTAIL) SUSP suspension Take 30 mLs by mouth as directed. Follow directions on bottle.Marland KitchenMarland KitchenShake well. 240 mL 0  . doxycycline (MONODOX) 100 MG capsule Take 1 capsule (100 mg total) by mouth 2 (two) times daily. 28 capsule 0  . ferrous sulfate 325 (65 FE) MG tablet Take 325 mg by mouth daily with breakfast.    . furosemide (LASIX) 20 MG tablet Take 1 tablet (20 mg total) by mouth daily. 10 tablet 0  . glipiZIDE (GLUCOTROL) 10 MG tablet Take 10 mg by mouth daily before breakfast.    . Insulin Glargine (LANTUS SOLOSTAR) 100 UNIT/ML Solostar Pen Inject 20-40 Units into the skin daily at 10 pm. 5 pen PRN  . irbesartan (AVAPRO) 300 MG tablet Take 1 tablet (300 mg total) by mouth daily. 30 tablet 11  . levothyroxine (SYNTHROID, LEVOTHROID) 50 MCG tablet Take 50 mcg by mouth daily before breakfast.    . metFORMIN (GLUCOPHAGE) 1000 MG tablet Take 1,000 mg by mouth daily with breakfast.    . metoprolol succinate (TOPROL-XL) 200 MG 24 hr tablet Take 1 tablet (200 mg total) by mouth daily. 90 tablet 3  . potassium chloride (K-DUR) 10 MEQ tablet Take 1 tablet (10 mEq total) by mouth daily. Take only on days when taking lasix 90 tablet 3  . valACYclovir (VALTREX) 1000 MG tablet Take 1,000 mg by mouth as needed.    [name] omeprazole (PRILOSEC) 20 MG capsule TAKE 1 CAPSULE EVERY DAY 90 capsule 5  . pantoprazole (PROTONIX) 40 MG tablet Take 30- 60 min before your first and last meals of the day 60 tablet 3   No current facility-administered medications for this visit.     Allergies:   Patient has no known allergies.    ROS:  Please see the history of present illness.   Otherwise, review of systems are positive  for cough with hemoptysis mildly.   All other systems are reviewed and negative.    PHYSICAL EXAM: VS:  BP 120/82   Pulse 76   Ht '6\' 1"'$  (1.854 m)   Wt 196 lb (88.9 kg)   BMI 25.86 kg/m  , BMI Body mass index is 25.86 kg/m. GENERAL:  Well appearing and in no distress NECK:  No jugular venous distention, waveform within normal limits, carotid upstroke brisk and symmetric, no bruits, no thyromegaly LUNGS:  Clear to auscultation bilaterally without wheezing or crackles.  BACK:  No CVA tenderness  CHEST: Well healed right thoracotomy scar HEART:  PMI not displaced or sustained,S1 and S2 within normal limits, no S3, no S4, no clicks, no rubs, no murmurs.  Unchanged from previous exam.   ABD:  Flat, positive bowel sounds normal in frequency in pitch, no bruits, no rebound, no guarding, no midline pulsatile mass, no hepatomegaly, no splenomegaly EXT:  2 plus pulses throughout, no edema, no cyanosis no clubbing   EKG:  EKG is done today. Sinus rhythm, rate 75, axis within normal limits, intervals within normal limits, no acute ST-T wave changes.   Recent Labs: 08/28/2016: ALT 12; BUN 15; Creatinine, Ser 0.88; Potassium 4.2; Sodium 140 10/13/2016: Hemoglobin 14.5; Platelets 210.0    Lipid Panel    Component Value Date/Time   CHOL 176 07/25/2016 1006   TRIG 125 07/25/2016 1006   HDL 42 07/25/2016 1006   CHOLHDL 4.2 07/25/2016 1006   LDLCALC 109 (H) 07/25/2016 1006      Wt Readings from Last 3 Encounters:  10/18/16 196 lb (88.9 kg)  10/13/16 196 lb 12.8 oz (89.3 kg)  10/05/16 201 lb (91.2 kg)      Other studies Reviewed: Additional studies/ records that were reviewed today include:  Pulm Notes Review of the above records demonstrates:    See above.     ASSESSMENT AND PLAN:  ATRIAL FLUTTER:  I think it is very likely that he has paroxysmal flutter.  Timothy Guerrero has a CHA2DS2 - VASc score of 4 with a risk of stroke of 4%.   He seems to be maintaining sinus rhythm. He had  anemia which I suspect is GI blood loss.  I did review his labs and his Hgb was stable at 14.5.  However, he reports hemoptysis and is in NSR.  With the GI history and hemoptysis I choose to keep him of anticoagulation.   HTN:   This is being managed in the context of treating his CHF  CAD:   As mentioned he has small vessel disease but no symptoms. I will continue to manage this medically.  I previously had a long discussion with him about the fact that it looks like he's had an inferior MI since his last stress test and now his ejection fraction is mildly reduced. However, because he feels so good manage this medically without diagnostic catheterization this point.  CHRONIC SYSTOLIC HF:   I will increase to 200 mg metoprolol XL today.  Current medicines are reviewed at length with the patient today.  The patient does have concerns regarding medicines.  The following changes have been made:  As above.   Labs/ tests ordered today include:   None  Orders Placed This Encounter  Procedures  . EKG 12-Lead     Disposition:   FU with me in 6 MONTHS.     Signed, Minus Breeding, MD  10/18/2016 12:52 PM    [facility]

## 2016-10-15 NOTE — Assessment & Plan Note (Addendum)
Changed acei to arb 10/13/2016 due to cough   In the best review of chronic cough to date ( NEJM 2016 375 [id number]) ,  ACEi are now felt to cause cough in up to  20% of pts which is a 4 fold increase from previous reports and does not include the variety of non-specific complaints we see in pulmonary clinic in pts on ACEi but previously attributed to another dx like  Copd/asthma and  include PNDS, throat and chest congestion, "bronchitis", unexplained dyspnea and noct "strangling" sensations, and hoarseness, but also  atypical /refractory GERD symptoms like dysphagia and "bad heartburn"   The only way I know  to prove this is not an "ACEi Case" is a trial off ACEi x a minimum of 6 weeks then regroup.   Try avapro 300 mg daily to reduce by half since if too strong a dose then recheck in 2 weeks

## 2016-10-15 NOTE — Assessment & Plan Note (Signed)
Upper airway cough syndrome (previously labeled PNDS) , is  so named because it's frequently impossible to sort out how much is  CR/sinusitis with freq throat clearing (which can be related to primary GERD)   vs  causing  secondary (" extra esophageal")  GERD from wide swings in gastric pressure that occur with throat clearing, often  promoting self use of mint and menthol lozenges that reduce the lower esophageal sphincter tone and exacerbate the problem further in a cyclical fashion.   These are the same pts (now being labeled as having "irritable larynx syndrome" by some cough centers) who not infrequently have failed to tolerate ace inhibitors,  dry powder inhalers or biphosphonates or report having atypical/extraesophageal reflux symptoms that don't respond to standard doses of PPI  and are easily confused as having aecopd or asthma flares by even experienced allergists/ pulmonologists (myself included).   rec start for now with trial off acei (see separate a/p) and dpi (see copd) and rx for gerd and f/u in 2 weeks.  Note pt very shaky on details of care/ meds so needs to return with all meds in hand using a trust but verify approach to confirm accurate Medication  Reconciliation The principal here is that until we are certain that the  patients are doing what we've asked, it makes no sense to ask them to do more.   I had an extended discussion with the patient reviewing all relevant studies completed to date and  lasting 25 minutes of a 40  minute acute visit with pt previously not known to be with acute and chronic   non-specific but potentially very serious refractory respiratory symptoms of unknown etiology.  Each maintenance medication was reviewed in detail including most importantly the difference between maintenance and prns and under what circumstances the prns are to be triggered using an action plan format that is not reflected in the computer generated alphabetically organized AVS.     Please see AVS for specific instructions unique to this office visit that I personally wrote and verbalized to the the pt in detail and then reviewed with pt  by my nurse highlighting any changes in therapy/plan of care  recommended at today's visit.

## 2016-10-15 NOTE — Assessment & Plan Note (Signed)
Try off dpi spiriva due to cough 10/13/2016 and just try rx with prn saba as he is barely a GOLD II with fev1 over 2 liters and may not be bronchodilator dependent at this point with dpi's potentially contributing to refractory uacs or at least synergistic with acei in contributing to his symptoms

## 2016-10-18 ENCOUNTER — Ambulatory Visit (INDEPENDENT_AMBULATORY_CARE_PROVIDER_SITE_OTHER): Payer: Medicare HMO | Admitting: Cardiology

## 2016-10-18 ENCOUNTER — Encounter: Payer: Self-pay | Admitting: Cardiology

## 2016-10-18 VITALS — BP 120/82 | HR 76 | Ht 73.0 in | Wt 196.0 lb

## 2016-10-18 DIAGNOSIS — I255 Ischemic cardiomyopathy: Secondary | ICD-10-CM | POA: Insufficient documentation

## 2016-10-18 DIAGNOSIS — I4892 Unspecified atrial flutter: Secondary | ICD-10-CM | POA: Diagnosis not present

## 2016-10-18 DIAGNOSIS — I5022 Chronic systolic (congestive) heart failure: Secondary | ICD-10-CM | POA: Diagnosis not present

## 2016-10-18 MED ORDER — METOPROLOL SUCCINATE ER 200 MG PO TB24: 200.0000 mg | ORAL_TABLET | Freq: Every day | ORAL | 3 refills | Status: DC

## 2016-10-18 NOTE — Patient Instructions (Signed)
Medication Instructions:  Please increase your Metoprolol to 200 mg a day. Continue all other medications as listed.  Follow-Up: Follow up in 6 months with Dr. Percival Spanish in [place].  You will receive a letter in the mail 2 months before you are due.  Please call us when you receive this letter to schedule your follow up appointment.  If you need a refill on your cardiac medications before your next appointment, please call your pharmacy.  Thank you for choosing [facility]!!

## 2016-10-24 DIAGNOSIS — L853 Xerosis cutis: Secondary | ICD-10-CM | POA: Diagnosis not present

## 2016-10-24 DIAGNOSIS — D1801 Hemangioma of skin and subcutaneous tissue: Secondary | ICD-10-CM | POA: Diagnosis not present

## 2016-10-24 DIAGNOSIS — L570 Actinic keratosis: Secondary | ICD-10-CM | POA: Diagnosis not present

## 2016-10-24 DIAGNOSIS — L821 Other seborrheic keratosis: Secondary | ICD-10-CM | POA: Diagnosis not present

## 2016-10-24 DIAGNOSIS — Z85820 Personal history of malignant melanoma of skin: Secondary | ICD-10-CM | POA: Diagnosis not present

## 2016-10-24 DIAGNOSIS — Z85828 Personal history of other malignant neoplasm of skin: Secondary | ICD-10-CM | POA: Diagnosis not present

## 2016-10-30 ENCOUNTER — Encounter: Payer: Self-pay | Admitting: Adult Health

## 2016-10-30 ENCOUNTER — Ambulatory Visit (INDEPENDENT_AMBULATORY_CARE_PROVIDER_SITE_OTHER): Payer: Medicare HMO | Admitting: Adult Health

## 2016-10-30 DIAGNOSIS — J449 Chronic obstructive pulmonary disease, unspecified: Secondary | ICD-10-CM

## 2016-10-30 DIAGNOSIS — R042 Hemoptysis: Secondary | ICD-10-CM

## 2016-10-30 DIAGNOSIS — I5022 Chronic systolic (congestive) heart failure: Secondary | ICD-10-CM

## 2016-10-30 NOTE — Assessment & Plan Note (Signed)
Recent flare with ongoing chronic cough w/ blood tinged mucus ? Etiology  ACE discontinuation may help with cough but ongoing hemoptysis is worrisome with is underlying cancer hx.  Will discuss with dR . Nestor regarding possible need for bronchscopy.   Plan  Patient Instructions  Continue on current regimen .  Add Mucinex DM Twice daily  As needed  Cough/congestion  Call if bleeding does not stop or worsens.  I will call once I speak with Dr. Ashok Cordia regarding next plan.  Follow up .Dr. Ashok Cordia in 2 months and As needed

## 2016-10-30 NOTE — Assessment & Plan Note (Signed)
Serial CT chest and cxr neg  Add mucinex dm  Remain off ACE  Will discuss possible FOB with Dr. Ashok Cordia.

## 2016-10-30 NOTE — Assessment & Plan Note (Signed)
Appears compensated w/out overt vol overload on exam  Pleural effusion resolved on CT chest in 08/2016.

## 2016-10-30 NOTE — Patient Instructions (Signed)
Continue on current regimen .  Add Mucinex DM Twice daily  As needed  Cough/congestion  Call if bleeding does not stop or worsens.  I will call once I speak with Dr. Ashok Cordia regarding next plan.  Follow up .Dr. Ashok Cordia in 2 months and As needed

## 2016-10-30 NOTE — Progress Notes (Signed)
$'@Patient'X$  ID: Timothy Guerrero, male    DOB: [birth year], 76 y.o.   MRN: [id number]  Chief Complaint  Patient presents with  . Follow-up    COPD     Referring provider: [name], MD  HPI: 76 yo male former smoker . Moderate COPD w/ Emphysema, Mild Restrictive Lung Disease, Multiple Lung Nodules, & Stage 1A NSCLC.s/p resection  Was in [place] with asbestos exposure with pleural plaque on CT chest  Hx of Prostate Cancer, Melanoma  Followed for CAD, A Flutter.   10/30/2016 Follow up : COPD /Lung Cancer /Hemoptysis/Cough  Pt returns for 2 week follow up . Pt was seen last ov with persistent cough and blood tinged mucus. Patient says over the last 5-6 months he's had persistent blood tinged mucus. On average 1-2 times a day with mucus that has blood mixed in.  Patient says he has had an antibiotic one or 2 times that did not help. He is using some over-the-counter cough syrup without much relief. CT chest in Dec and March showed no evidence of dz recurrence. Pt did have pleural effusion in Dec that resolved on CT in March.  He was dx systolic CHF in Dec. He has known  AFib /Flutter on coumadin but this was stopped in Dec/Jan time frame  after severe anemia requiring transfusion. Diona Fanti was also stopped. Due to bleeding .  Last ov his ACE was stopped due to persistent cough . PPI was increased to Twice daily  .  Pt returns today with no change in cough or blood tinged mucus.  Denies wt loss, bloody stools.  Labs  Showed nml hbg last ov , CXR w/ no acute process.     No Known Allergies  Immunization History  Administered Date(s) Administered  . Influenza, High Dose Seasonal PF 04/19/2016  . Influenza,inj,Quad PF,36+ Mos 03/31/2015  . Pneumococcal Conjugate-13 07/30/2015  . Tdap 04/24/2016    Past Medical History:  Diagnosis Date  . Anemia   . Arthritis   . Atrial flutter ([place])    post op following lobectomy  . Benign essential tremor   . Blood transfusion without reported  diagnosis   . Cataract   . CHF (congestive heart failure) ([place])   . Colon polyp    2007, polyp with early cancer, one year f/u no residual polyp. next TCS 2010, see PSH. [facility].  [name] COPD (chronic obstructive pulmonary disease) ([place])   . Diabetes mellitus without complication ([place])    10 years  . GERD (gastroesophageal reflux disease)   . HOH (hard of hearing)   . Hypercholesteremia   . Hypertension    10 years  . Lung cancer ([place])   . Prostate cancer ([place])   . Shortness of breath dyspnea    due to lung mass  . Skin melanoma ([place])   . Sleep apnea    wears CPAP sometimes, cannot tolerate all the time.    Tobacco History: History  Smoking Status  . Former Smoker  . Packs/day: 2.00  . Years: 32.00  . Types: Cigarettes  . Quit date: 07/03/1986  Smokeless Tobacco  . Never Used   Counseling given: Not Answered   Outpatient Encounter Prescriptions as of 10/30/2016  Medication Sig  . amLODipine (NORVASC) 10 MG tablet Take 10 mg by mouth daily. ((blood pressure))  . dextromethorphan-guaiFENesin (MUCINEX DM) 30-600 MG 12hr tablet Take 1 tablet by mouth 2 (two) times daily as needed for cough. ((for cough, congestion, mucus))  .  furosemide (LASIX) 20 MG tablet Take 1 tablet (20 mg total) by mouth daily.  [name] glipiZIDE (GLUCOTROL) 10 MG tablet Take 10 mg by mouth daily before breakfast. ((for blood sugar))  . Insulin Glargine (LANTUS SOLOSTAR) 100 UNIT/ML Solostar Pen Inject 20-40 Units into the skin daily at 10 pm.  . irbesartan (AVAPRO) 300 MG tablet Take 1 tablet (300 mg total) by mouth daily.  [name] levothyroxine (SYNTHROID, LEVOTHROID) 50 MCG tablet Take 50 mcg by mouth daily before breakfast. ((for thyroid))  . metFORMIN (GLUCOPHAGE) 1000 MG tablet Take 1,000 mg by mouth 2 (two) times daily with a meal. ((for blood sugar))  . metoprolol succinate (TOPROL-XL) 100 MG 24 hr tablet Take 100 mg by mouth daily. Take with or immediately following a meal.  ((for heart))  .  metoprolol succinate (TOPROL-XL) 50 MG 24 hr tablet Take 50 mg by mouth daily. Take with or immediately following a meal.  ((for heart))  . Multiple Vitamins-Minerals (CENTRUM SILVER 50+MEN) TABS Take 1 tablet by mouth daily.  . pantoprazole (PROTONIX) 40 MG tablet Take 30- 60 min before your first and last meals of the day  . valACYclovir (VALTREX) 1000 MG tablet Take 1,000 mg by mouth as needed.  . potassium chloride (K-DUR) 10 MEQ tablet Take 1 tablet (10 mEq total) by mouth daily. Take only on days when taking lasix  . [DISCONTINUED] Alum & Mag Hydroxide-Simeth (GI COCKTAIL) SUSP suspension Take 30 mLs by mouth as directed. Follow directions on bottle.Marland KitchenMarland KitchenShake well. (Patient not taking: Reported on 10/30/2016)  . [DISCONTINUED] ferrous sulfate 325 (65 FE) MG tablet Take 325 mg by mouth daily with breakfast.  . [DISCONTINUED] metoprolol succinate (TOPROL-XL) 200 MG 24 hr tablet Take 1 tablet (200 mg total) by mouth daily. (Patient not taking: Reported on 10/30/2016)  . [DISCONTINUED] omeprazole (PRILOSEC) 20 MG capsule TAKE 1 CAPSULE EVERY DAY (Patient not taking: Reported on 10/30/2016)   No facility-administered encounter medications on file as of 10/30/2016.      Review of Systems  Constitutional:   No  weight loss, night sweats,  Fevers, chills, + fatigue, or  lassitude.  HEENT:   No headaches,  Difficulty swallowing,  Tooth/dental problems, or  Sore throat,                No sneezing, itching, ear ache, nasal congestion, post nasal drip,   CV:  No chest pain,  Orthopnea, PND, swelling in lower extremities, anasarca, dizziness, palpitations, syncope.   GI  No heartburn, indigestion, abdominal pain, nausea, vomiting, diarrhea, change in bowel habits, loss of appetite, bloody stools.   Resp:   No chest wall deformity  Skin: no rash or lesions.  GU: no dysuria, change in color of urine, no urgency or frequency.  No flank pain, no hematuria   MS:  No joint pain or swelling.  No  decreased range of motion.  No back pain.    Physical Exam  BP (!) 142/84 (BP Location: Left Arm, Cuff Size: Normal)   Pulse 87   Ht '6\' 1"'$  (1.854 m)   Wt 198 lb (89.8 kg)   SpO2 95%   BMI 26.12 kg/m   GEN: A/Ox3; pleasant , NAD, elderly    HEENT:  [place]/AT,  EACs-clear, TMs-wnl, NOSE-clear, THROAT-clear, no lesions, no postnasal drip or exudate noted.   NECK:  Supple w/ fair ROM; no JVD; normal carotid impulses w/o bruits; no thyromegaly or nodules palpated; no lymphadenopathy.    RESP  Clear  P & A; w/o, wheezes/  rales/ or rhonchi. no accessory muscle use, no dullness to percussion  CARD:  RRR, no m/r/g, no peripheral edema, pulses intact, no cyanosis or clubbing.  GI:   Soft & nt; nml bowel sounds; no organomegaly or masses detected.   Musco: Warm bil, no deformities or joint swelling noted.   Neuro: alert, no focal deficits noted.    Skin: Warm, no lesions or rashes   Lab Results:  CBC  No results found for: BNP  ProBNP No results found for: PROBNP  Imaging: Dg Chest 2 View  Result Date: 10/13/2016 CLINICAL DATA:  Cough and hemoptysis EXAM: CHEST  2 VIEW COMPARISON:  09/01/2016 FINDINGS: Cardiac shadow is within normal limits. The lungs are well aerated bilaterally. Surgical changes are seen on the right. No focal infiltrate or sizable effusion is noted. No bony abnormality is seen. IMPRESSION: No active cardiopulmonary disease. Electronically Signed   By: Inez Catalina M.D.   On: 10/13/2016 11:00     Assessment & Plan:   COPD, moderate (HCC) Recent flare with ongoing chronic cough w/ blood tinged mucus ? Etiology  ACE discontinuation may help with cough but ongoing hemoptysis is worrisome with is underlying cancer hx.  Will discuss with dR . Nestor regarding possible need for bronchscopy.   Plan  Patient Instructions  Continue on current regimen .  Add Mucinex DM Twice daily  As needed  Cough/congestion  Call if bleeding does not stop or worsens.  I will  call once I speak with Dr. Ashok Cordia regarding next plan.  Follow up .Dr. Ashok Cordia in 2 months and As needed      Chronic systolic HF (heart failure) ([place]) Appears compensated w/out overt vol overload on exam  Pleural effusion resolved on CT chest in 08/2016.   Hemoptysis Serial CT chest and cxr neg  Add mucinex dm  Remain off ACE  Will discuss possible FOB with Dr. Ashok Cordia.      Rexene Edison, NP 10/30/2016

## 2016-10-31 NOTE — Progress Notes (Signed)
Note reviewed.  Sonia Baller Ashok Cordia, M.D. [facility] Critical [facility] Pager:  [phone] After 3pm or if no response, call [phone] 11:08 PM 10/31/16

## 2016-11-01 ENCOUNTER — Telehealth: Payer: Self-pay | Admitting: Adult Health

## 2016-11-01 NOTE — Telephone Encounter (Signed)
Called spoke with Timothy Guerrero > bronchoscopy is scheduled for 5.8.17 @ [facility] for 0700 Called spoke with patient, advised of bronch date/time.  Pt requesting information be mailed to his home address  Phone note routed to [facility] and copied to TP Nothing further needed; will sign off

## 2016-11-01 NOTE — Telephone Encounter (Signed)
Pt will need to be set up for bronchoscopy w/ Dr. Ashok Cordia .  Pt is aware  See if [facility] at [facility] is available 5/8 am hours (7-12)  Or Frid 5/11 9-12   Or 5/14-5/17 7:30 available . At [facility].   Please call pt with date/times and instructions.  Once you have date/time , please also send to dr. Ashok Cordia .

## 2016-11-01 NOTE — Telephone Encounter (Signed)
I called him , see phone message

## 2016-11-01 NOTE — Telephone Encounter (Signed)
4/30 AVS:  Patient Instructions   Continue on current regimen .  Add Mucinex DM Twice daily  As needed  Cough/congestion  Call if bleeding does not stop or worsens.  I will call once I speak with Dr. Ashok Cordia regarding next plan.  Follow up .Dr. Ashok Cordia in 2 months and As needed     Hey TP, have you had a chance to talk with JN yet? Pt is calling for update. Please advise, thanks

## 2016-11-07 ENCOUNTER — Telehealth: Payer: Self-pay | Admitting: Pulmonary Disease

## 2016-11-07 ENCOUNTER — Encounter (HOSPITAL_COMMUNITY): Payer: Self-pay | Admitting: Respiratory Therapy

## 2016-11-07 ENCOUNTER — Ambulatory Visit (HOSPITAL_COMMUNITY)
Admission: RE | Admit: 2016-11-07 | Discharge: 2016-11-07 | Disposition: A | Discharge status: Home / Self Care | Payer: Medicare HMO | Source: Ambulatory Visit | Attending: Pulmonary Disease | Admitting: Pulmonary Disease

## 2016-11-07 ENCOUNTER — Other Ambulatory Visit: Payer: Self-pay | Admitting: *Deleted

## 2016-11-07 ENCOUNTER — Ambulatory Visit (INDEPENDENT_AMBULATORY_CARE_PROVIDER_SITE_OTHER): Payer: Medicare HMO | Admitting: Family Medicine

## 2016-11-07 ENCOUNTER — Encounter (HOSPITAL_COMMUNITY)
Admission: RE | Disposition: A | Discharge status: Home / Self Care | Payer: Self-pay | Source: Ambulatory Visit | Attending: Pulmonary Disease

## 2016-11-07 ENCOUNTER — Encounter: Payer: Self-pay | Admitting: Family Medicine

## 2016-11-07 VITALS — BP 198/87 | HR 69 | Temp 97.4°F | Ht 73.0 in | Wt 197.0 lb

## 2016-11-07 DIAGNOSIS — Z96642 Presence of left artificial hip joint: Secondary | ICD-10-CM | POA: Insufficient documentation

## 2016-11-07 DIAGNOSIS — I509 Heart failure, unspecified: Secondary | ICD-10-CM | POA: Diagnosis not present

## 2016-11-07 DIAGNOSIS — G250 Essential tremor: Secondary | ICD-10-CM | POA: Diagnosis not present

## 2016-11-07 DIAGNOSIS — Z8546 Personal history of malignant neoplasm of prostate: Secondary | ICD-10-CM | POA: Insufficient documentation

## 2016-11-07 DIAGNOSIS — E119 Type 2 diabetes mellitus without complications: Secondary | ICD-10-CM | POA: Diagnosis not present

## 2016-11-07 DIAGNOSIS — E039 Hypothyroidism, unspecified: Secondary | ICD-10-CM | POA: Insufficient documentation

## 2016-11-07 DIAGNOSIS — Z96612 Presence of left artificial shoulder joint: Secondary | ICD-10-CM | POA: Insufficient documentation

## 2016-11-07 DIAGNOSIS — Z87891 Personal history of nicotine dependence: Secondary | ICD-10-CM | POA: Diagnosis not present

## 2016-11-07 DIAGNOSIS — J449 Chronic obstructive pulmonary disease, unspecified: Secondary | ICD-10-CM | POA: Diagnosis not present

## 2016-11-07 DIAGNOSIS — Z96653 Presence of artificial knee joint, bilateral: Secondary | ICD-10-CM | POA: Diagnosis not present

## 2016-11-07 DIAGNOSIS — I110 Hypertensive heart disease with heart failure: Secondary | ICD-10-CM | POA: Insufficient documentation

## 2016-11-07 DIAGNOSIS — D500 Iron deficiency anemia secondary to blood loss (chronic): Secondary | ICD-10-CM | POA: Diagnosis not present

## 2016-11-07 DIAGNOSIS — G4730 Sleep apnea, unspecified: Secondary | ICD-10-CM | POA: Insufficient documentation

## 2016-11-07 DIAGNOSIS — I10 Essential (primary) hypertension: Secondary | ICD-10-CM | POA: Insufficient documentation

## 2016-11-07 DIAGNOSIS — Z85118 Personal history of other malignant neoplasm of bronchus and lung: Secondary | ICD-10-CM | POA: Insufficient documentation

## 2016-11-07 DIAGNOSIS — I160 Hypertensive urgency: Secondary | ICD-10-CM | POA: Insufficient documentation

## 2016-11-07 DIAGNOSIS — K219 Gastro-esophageal reflux disease without esophagitis: Secondary | ICD-10-CM | POA: Insufficient documentation

## 2016-11-07 DIAGNOSIS — E7800 Pure hypercholesterolemia, unspecified: Secondary | ICD-10-CM | POA: Diagnosis not present

## 2016-11-07 DIAGNOSIS — Z7984 Long term (current) use of oral hypoglycemic drugs: Secondary | ICD-10-CM | POA: Insufficient documentation

## 2016-11-07 DIAGNOSIS — Z85820 Personal history of malignant melanoma of skin: Secondary | ICD-10-CM | POA: Insufficient documentation

## 2016-11-07 DIAGNOSIS — R042 Hemoptysis: Secondary | ICD-10-CM | POA: Insufficient documentation

## 2016-11-07 LAB — GLUCOSE, CAPILLARY: Glucose-Capillary: 114 mg/dL — ABNORMAL HIGH (ref 65–99)

## 2016-11-07 SURGERY — CANCELLED PROCEDURE
Laterality: Bilateral

## 2016-11-07 MED ORDER — FENTANYL CITRATE (PF) 100 MCG/2ML IJ SOLN
INTRAMUSCULAR | Status: AC
  Filled 2016-11-07: qty 4

## 2016-11-07 MED ORDER — LEVOTHYROXINE SODIUM 50 MCG PO TABS: 50.0000 ug | ORAL_TABLET | Freq: Every day | ORAL | 1 refills | Status: DC

## 2016-11-07 MED ORDER — SODIUM CHLORIDE 0.9 % IV SOLN
Freq: Once | INTRAVENOUS | Status: AC
  Administered 2016-11-07: 07:00:00 via INTRAVENOUS

## 2016-11-07 MED ORDER — HYDRALAZINE HCL 50 MG PO TABS: 50.0000 mg | ORAL_TABLET | Freq: Two times a day (BID) | ORAL | 0 refills | Status: DC

## 2016-11-07 MED ORDER — MIDAZOLAM HCL 5 MG/ML IJ SOLN
INTRAMUSCULAR | Status: AC
  Filled 2016-11-07: qty 2

## 2016-11-07 NOTE — Progress Notes (Signed)
Patient presented for bronchoscopy.  Dr. Ashok Cordia arrived to room.  Patient's blood pressure 220/100 while in the room.  Dr. Ashok Cordia cancelled the procedure because of his blood pressure.  IV removed, and patient discharged to home.

## 2016-11-07 NOTE — Telephone Encounter (Signed)
Called and spoke to pt. Pt states his pressure is now 117/72, pt is questioning when he can be rescheduled for bronchoscopy.   Dr. Ashok Cordia please advise. Thanks.

## 2016-11-07 NOTE — H&P (Signed)
[facility]  Date of Procedure:  11/07/2016  Reason/Chief Complaint:  Persistent Hemoptysis  History of Presenting Illness:  76 y.o. male with history of right lower lobe squamous cell carcinoma post lobectomy in June 2016. Patient has had persistent hemoptysis without signs of recurrent malignancy on imaging. Does also have moderate COPD with emphysema underlying and is currently on no anticoagulation or antiplatelet therapy. She reports he took his antihypertensive regimen this morning with the exception of his Lasix. Reports he did eat one slice of pizza last night but has no other excessive salt intake. Denies any chest pain, pressure, or tightness. Did have one episode of hemoptysis this morning that he describes as blood mixed in with usual phlegm. Has minimal amounts of blood content to his phlegm during the rest of the day. Majority of the blood production is first thing in the morning. Denies any epistaxis. Denies any sore throat. Denies any abdominal pain or nausea. Denies any melena or hematochezia. Denies any headache or acute vision changes. Denies any focal weakness, numbness, or tingling.  Review of Systems:  No rashes or abnormal bruising. No dysuria or hematuria. A pertinent 14 point review of systems is negative except as per the history of presenting illness.  No Known Allergies  No current facility-administered medications on file prior to encounter.    Current Outpatient Prescriptions on File Prior to Encounter  Medication Sig Dispense Refill  . amLODipine (NORVASC) 10 MG tablet Take 10 mg by mouth daily. ((blood pressure))    . dextromethorphan-guaiFENesin (MUCINEX DM) 30-600 MG 12hr tablet Take 1 tablet by mouth 2 (two) times daily as needed for cough. ((for cough, congestion, mucus))    . furosemide (LASIX) 20 MG tablet Take 1 tablet (20 mg total) by mouth daily. 10 tablet 0  . glipiZIDE (GLUCOTROL) 10 MG tablet Take 10 mg by mouth daily before  breakfast. ((for blood sugar))    . Insulin Glargine (LANTUS SOLOSTAR) 100 UNIT/ML Solostar Pen Inject 20-40 Units into the skin daily at 10 pm. 5 pen PRN  . metFORMIN (GLUCOPHAGE) 1000 MG tablet Take 1,000 mg by mouth 2 (two) times daily with a meal. ((for blood sugar))    . metoprolol succinate (TOPROL-XL) 100 MG 24 hr tablet Take 100 mg by mouth daily. Take with or immediately following a meal.  ((for heart))    . Multiple Vitamins-Minerals (CENTRUM SILVER 50+MEN) TABS Take 1 tablet by mouth daily.    . irbesartan (AVAPRO) 300 MG tablet Take 1 tablet (300 mg total) by mouth daily. 30 tablet 11  . levothyroxine (SYNTHROID, LEVOTHROID) 50 MCG tablet Take 50 mcg by mouth daily before breakfast. ((for thyroid))    . metoprolol succinate (TOPROL-XL) 50 MG 24 hr tablet Take 50 mg by mouth daily. Take with or immediately following a meal.  ((for heart))    . pantoprazole (PROTONIX) 40 MG tablet Take 30- 60 min before your first and last meals of the day 60 tablet 3  . potassium chloride (K-DUR) 10 MEQ tablet Take 1 tablet (10 mEq total) by mouth daily. Take only on days when taking lasix 90 tablet 3  . valACYclovir (VALTREX) 1000 MG tablet Take 1,000 mg by mouth as needed.      Past Medical History:  Diagnosis Date  . Anemia   . Arthritis   . Atrial flutter ([employer])    post op following lobectomy  . Benign essential tremor   . Blood transfusion without reported diagnosis   . Cataract   .  CHF (congestive heart failure) ([place])   . Colon polyp    2007, polyp with early cancer, one year f/u no residual polyp. next TCS 2010, see PSH. [facility].  [name] COPD (chronic obstructive pulmonary disease) ([place])   . Diabetes mellitus without complication ([place])    10 years  . GERD (gastroesophageal reflux disease)   . HOH (hard of hearing)   . Hypercholesteremia   . Hypertension    10 years  . Lung cancer ([place])   . Prostate cancer ([place])   . Shortness of breath dyspnea    due to lung mass  .  Skin melanoma ([place])   . Sleep apnea    wears CPAP sometimes, cannot tolerate all the time.    Past Surgical History:  Procedure Laterality Date  . bottom lobe of right lung removed  12/17/14  . CARDIAC CATHETERIZATION     no PCI  . CATARACT EXTRACTION Left   . CATARACT EXTRACTION W/PHACO Left 01/03/2016   Procedure: CATARACT EXTRACTION PHACO AND INTRAOCULAR LENS PLACEMENT LEFT EYE CDE=7.78;  Surgeon: Williams Che, MD;  Location: [facility];  Service: Ophthalmology;  Laterality: Left;  . CATARACT EXTRACTION W/PHACO Right 03/20/2016   Procedure: CATARACT EXTRACTION PHACO AND INTRAOCULAR LENS PLACEMENT; CDE:  9.58;  Surgeon: Williams Che, MD;  Location: [facility];  Service: Ophthalmology;  Laterality: Right;  . COLONOSCOPY  08/2008   Dr. Leafy Half, [place]: normal, internal grade 1 hemorrhoids. next TCS 08/2013  . COLONOSCOPY N/A 03/04/2014   Procedure: COLONOSCOPY;  Surgeon: Daneil Dolin, MD;  Location: [facility];  Service: Endoscopy;  Laterality: N/A;  7:30am  . EYE SURGERY    . knee replacements     bilateral. over 10 years ago  . LOBECTOMY Right 12/17/2014   Procedure: RIGHT LOWER LOBECTOMY;  Surgeon: Melrose Nakayama, MD;  Location: [facility];  Service: Thoracic;  Laterality: Right;  . LYMPH NODE DISSECTION Right 12/17/2014   Procedure: LYMPH NODE DISSECTION;  Surgeon: Melrose Nakayama, MD;  Location: [facility];  Service: Thoracic;  Laterality: Right;  . NECK SURGERY     melanoma removed.  [name] PROSTATE SURGERY     prostatectomy, radical  . TOTAL HIP ARTHROPLASTY  2007   left  . total shoulder replacement  2011   left  . VIDEO ASSISTED THORACOSCOPY (VATS)/WEDGE RESECTION Right 12/17/2014   Procedure: RIGHT VIDEO ASSISTED THORACOSCOPY (VATS);  Surgeon: Melrose Nakayama, MD;  Location: [facility];  Service: Thoracic;  Laterality: Right;    Family History  Problem Relation Age of Onset  . Cancer Mother     unknown type  . COPD Sister   . Breast cancer Sister    . Cancer Sister 50    breast  . Prostate cancer Son     died age 23  . Heart attack Daughter   . Colon cancer Neg Hx     Social History   Social History  . Marital status: Divorced    Spouse name: N/A  . Number of children: 3  . Years of education: N/A   Occupational History  . retired    Social History Main Topics  . Smoking status: Former Smoker    Packs/day: 2.00    Years: 32.00    Types: Cigarettes    Quit date: 07/03/1986  . Smokeless tobacco: Never Used  . Alcohol use No     Comment: rarely wine  . Drug use: No  .  Sexual activity: Yes   Other Topics Concern  . None   Social History Engineer, materials.  Lives alone.      [facility]:   Originally from [facility]. Served in [employer] with significant exposure to asbestos. 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 He has also lived in [place], [place], & [place]. He has lived in [place] since 1979. 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 No pets currently. Remote bird exposure.     BP (!) 226/103   Pulse 70   Temp 97.7 F (36.5 C) (Oral)   Resp (!) 22   Ht '6\' 1"'$  (1.854 m)   Wt 86.2 kg (190 lb)   SpO2 97%   BMI 25.07 kg/m   General:  Awake. Alert. No acute distress. Respiratory therapist at bedside. Integument:  Warm & dry. No rash on exposed skin.  Extremities:  No cyanosis or clubbing.  Lymphatics:  No appreciated cervical or supraclavicular lymphadenoapthy. HEENT:  Moist mucus membranes. No oral ulcers. No scleral injection or icterus. Mallampati class III. Cardiovascular:  Regular rate & rhythm. No edema. No JVD appreciated. Symmetric radial pulses. Pulmonary:  Good aeration & clear to auscultation bilaterally. Symmetric chest wall expansion. No accessory muscle use on room air. Abdomen: Soft. Normal bowel sounds. Nondistended. Nontender. Musculoskeletal:  Normal bulk and tone. Hand grip strength 5/5 bilaterally. No joint deformity or effusion appreciated. Neurological:  Cranial nerves 2-12 grossly in tact. No  meningismus. Moving all 4 extremities equally. [name] Psychiatric:  Mood and affect congruent. Speech normal rhythm, rate & tone.   CBC Latest Ref Rng & Units 10/13/2016 08/28/2016 07/25/2016  WBC 4.0 - 10.5 K/uL 8.3 8.1 8.2  Hemoglobin 13.0 - 17.0 g/dL 14.5 - -  Hematocrit 39.0 - 52.0 % 43.8 47.7 47.2  Platelets 150.0 - 400.0 K/uL 210.0 246 222   BMP Latest Ref Rng & Units 08/28/2016 07/25/2016 06/27/2016  Glucose 65 - 99 mg/dL 295(H) 76 187(H)  BUN 8 - 27 mg/dL '15 11 11  '$ Creatinine 0.76 - 1.27 mg/dL 0.88 0.86 0.89  BUN/Creat Ratio 10 - '24 17 13 12  '$ Sodium 134 - 144 mmol/L 140 148(H) 146(H)  Potassium 3.5 - 5.2 mmol/L 4.2 4.0 4.0  Chloride 96 - 106 mmol/L 98 104 103  CO2 18 - 29 mmol/L '28 25 27  '$ Calcium 8.6 - 10.2 mg/dL 9.8 10.3(H) 9.9   INR/Prothrombin Time (10/13/16):  1.0 PTT (10/13/16):  28.1  PFT 09/08/16: FVC 4.21 L (92%) FEV1 2.60 L (70%) FEV1/FVC 0.62 FEF 25-75 1.31 L (84%) negative bronchodilator response 05/17/16: FVC 3.47 L (73%) FEV1 2.12 L (61%) FEV1/FVC 0.61 FEF 25-75 1.05 L (41%) positive bronchodilator response TLC 5.67 L (74%) RV 70% ERV 69% DLCO uncorrected 43%  6MWT 06/13/16:  Walked 336 meters / Baseline Sat 98% on RA / Nadir Sat 97% on RA  IMAGING CT CHEST W/O 09/01/16 (per radiologist):  Status post right lower lobectomy without evidence of recurrent disease. Resolution of previously seen right-sided pleural effusion. Scattered calcified nodules consistent with prior granulomatous disease. There are a few scattered noncalcified or partially calcified nodules which are stable from multiple previous exams consistent with a benign etiology. Stable calcified pleural plaques.   CT CHEST W/O 06/08/16 (previously reviewed by me): Bilateral pleural plaques with some calcification. Subcentimeter pulmonary nodules again noted. Emphysema also again noted. Slight enlargement in right pleural effusion with small left pleural effusion. No pericardial effusion. Adrenal nodule also  noted. No pathologic mediastinal adenopathy.  CT CHEST W/ 12/21/15 (previously reviewed by me): No pericardial effusion. Bilateral calcified pleural plaques. Trace right pleural effusion. Status post right lower lobectomy. Centrilobular and paraseptal emphysema that is apical predominant. 2 mm left upper lobe nodule stable since 2012. 3 mm left lower lobe nodule was not present on previous CT imaging. 1.6 cm right adrenal nodule consistent with imaging from 2012.  CARDIAC TTE (06/06/16): LV normal in size with mild LVH. EF 25-30% with diffuse hypokinesis. Grade 2 diastolic dysfunction. LA moderately dilated & RA normal in size. RV normal in size and function. No aortic stenosis or regurgitation. Aortic root normal in size. Mild to moderate mitral regurgitation without stenosis. No pulmonic stenosis. Trivial tricuspid regurgitation. No pericardial effusion.  PATHOLOGY Right Lower Lobectomy (12/17/14):  Right Pleural Biposy w/o malignancy but calcification present / 2.8 cm Invasive Poorly Differentiated Squamous Cell Carcinoma / Lymph Nodes Negative   LABS 05/15/16 CBC:  7.6/7.3/24.5/352 Ferritin:  6.6 Alpha-1 antitrypsin: MM (163)  03/14/16 CBC:  9.5/9.2/30.6/381  ASSESSMENT/PLAN:  76 y.o. male with persistent hemoptysis and known history of right lower lobe lung cancer post lobectomy. Patient has moderate COPD with emphysema. With persistent hemoptysis the risks of bronchoscopy were discussed with the patient including bleeding, infection, pneumothorax, vocal cord injury, medication allergy, and potentially death. The patient is willing to undergo the procedure. However, the patient does have uncontrolled hypertension this morning. I discussed the risks of the procedure with his degree of uncontrolled hypertension would include possible stroke among other complications. We discussed the potential for stroke and other adverse effects from his uncontrolled hypertension even without undergoing the  procedure. I recommended the patient go to the emergency room for evaluation and control of his hypertension which could require admission. The patient declined. I further recommended that he at least take his Lasix after he goes home and contact his primary care physician for an appointment today and reassessment of his blood pressure.  1. Hemoptysis:  Deferring bronchoscopy given hypertensive urgency. 2. Hypertensive Urgency:  Patient declines evaluation in the emergency department despite being made aware of the potential life-threatening risks with the degree of elevation in his blood pressure. Recommended he take his Lasix after he gets home and contact his PCP for an appointment today to assess his blood pressure and make medication adjustments. Recommended he dial 911 and seek immediate medical attention for any focal neurological deficits chest discomfort or difficult breathing. 3. Follow-up:  Patient already scheduled for an appointment with me on 5/15.  Sonia Baller Ashok Cordia, M.D. [facility] Pager:  [phone] After 3pm or if no response, call [phone] 7:07 AM 11/07/16

## 2016-11-07 NOTE — Progress Notes (Signed)
Subjective:  Patient ID: Timothy Guerrero, male    DOB: [birth year]  Age: 76 y.o. MRN: [id number]  CC: Hypertension (pt here today after having a BP of 223/103 this morning prior to having a procedure)   HPI Timothy Guerrero presents for  follow-up of hypertension.  Patient denies side effects from medication. States taking it regularly. Seen this morning for bronchoscopy by Dr. Milinda Hirschfeld. Noted to have markedly elevated blood pressure. That report from Dr. Ashok Cordia has been reviewed. Of note is that he has had hemoptysis and has had a right lower lobe lobectomy 2 years ago for squamous cell carcinoma. He also has a history of prostate carcinoma treated 15 years ago and now in long-term clinical remission. He is a diabetic on oral medications. Blood pressure noted there with Dr. Milinda Hirschfeld this morning to be 226/103. Last fall he had blood loss of 2 pints and had to have transfusions. He was taken off all blood thinners by Dr. [name] at that time. Since that time workup has been ongoing for source of blood loss. He has not recently had upper endoscopy or colonoscopy. However he denies hematemesis hematochezia and melena. Recently started the hemoptysis and that brings [place] to the above-mentioned bronchoscopy scheduled for this morning. In conversation he states that he has tried clonidine in the past. He was taken off of it about the time of the transfusions apparently. Unclear whether the side effects he was having were from the anemia of blood loss or from the clonidine itself. He tells me also that he is a hypothyroid patient and he is run out of his Synthroid. Not sure when he quit taking it. We confirmed the other medications that he is taking including his diabetes medicines and blood pressure medicines. Blood pressure medicines include Avapro metoprolol and amlodipine. He takes furosemide normally but because of the profuse this of his urination he did not take it this morning since he had the procedure  scheduled. Patient denies any focal neurologic findings related to the elevated blood pressure. No TIA, headache or other stroke syndrome symptoms today. In fact he says he feels good.   History Timothy Guerrero has a past medical history of Anemia; Arthritis; Atrial flutter ([place]); Benign essential tremor; Blood transfusion without reported diagnosis; Cataract; CHF (congestive heart failure) ([place]); Colon polyp; COPD (chronic obstructive pulmonary disease) ([place]); Diabetes mellitus without complication ([place]); GERD (gastroesophageal reflux disease); HOH (hard of hearing); Hypercholesteremia; Hypertension; Lung cancer ([place]); Prostate cancer ([place]); Shortness of breath dyspnea; Skin melanoma ([place]); and Sleep apnea.   He has a past surgical history that includes Prostate surgery; knee replacements; Neck surgery; Total hip arthroplasty (2007); total shoulder replacement (2011); Colonoscopy (08/2008); Colonoscopy (N/A, 03/04/2014); Cardiac catheterization; Video assisted thoracoscopy (vats)/wedge resection (Right, 12/17/2014); Lobectomy (Right, 12/17/2014); Lymph node dissection (Right, 12/17/2014); bottom lobe of right lung removed (12/17/14); Eye surgery; Cataract extraction (Left); Cataract extraction w/PHACO (Left, 01/03/2016); and Cataract extraction w/PHACO (Right, 03/20/2016).   His family history includes Breast cancer in his sister; COPD in his sister; Cancer in his mother; Cancer (age of onset: 29) in his sister; Heart attack in his daughter; Prostate cancer in his son.He reports that he quit smoking about 30 years ago. His smoking use included Cigarettes. He has a 64.00 pack-year smoking history. He has never used smokeless tobacco. He reports that he does not drink alcohol or use drugs.  Current Outpatient Prescriptions on File Prior to Visit  Medication Sig Dispense Refill  . amLODipine (NORVASC) 10 MG tablet Take  10 mg by mouth daily. ((blood pressure))    . dextromethorphan-guaiFENesin (MUCINEX DM) 30-600 MG 12hr  tablet Take 1 tablet by mouth 2 (two) times daily as needed for cough. ((for cough, congestion, mucus))    . furosemide (LASIX) 20 MG tablet Take 1 tablet (20 mg total) by mouth daily. 10 tablet 0  . glipiZIDE (GLUCOTROL) 10 MG tablet Take 10 mg by mouth daily before breakfast. ((for blood sugar))    . Insulin Glargine (LANTUS SOLOSTAR) 100 UNIT/ML Solostar Pen Inject 20-40 Units into the skin daily at 10 pm. 5 pen PRN  . irbesartan (AVAPRO) 300 MG tablet Take 1 tablet (300 mg total) by mouth daily. 30 tablet 11  . metFORMIN (GLUCOPHAGE) 1000 MG tablet Take 1,000 mg by mouth 2 (two) times daily with a meal. ((for blood sugar))    . metoprolol succinate (TOPROL-XL) 100 MG 24 hr tablet Take 100 mg by mouth daily. Take with or immediately following a meal.  ((for heart))    . metoprolol succinate (TOPROL-XL) 50 MG 24 hr tablet Take 50 mg by mouth daily. Take with or immediately following a meal.  ((for heart))    . Multiple Vitamins-Minerals (CENTRUM SILVER 50+MEN) TABS Take 1 tablet by mouth daily.    . pantoprazole (PROTONIX) 40 MG tablet Take 30- 60 min before your first and last meals of the day 60 tablet 3  . potassium chloride (K-DUR) 10 MEQ tablet Take 1 tablet (10 mEq total) by mouth daily. Take only on days when taking lasix 90 tablet 3  . valACYclovir (VALTREX) 1000 MG tablet Take 1,000 mg by mouth as needed.     No current facility-administered medications on file prior to visit.     ROS Review of Systems  Constitutional: Negative for chills, diaphoresis and fever.  HENT: Negative for rhinorrhea and sore throat.   Respiratory: Negative for cough and shortness of breath.   Cardiovascular: Negative for chest pain.  Gastrointestinal: Negative for abdominal pain.  Musculoskeletal: Negative for arthralgias and myalgias.  Skin: Negative for rash.  Neurological: Negative for weakness and headaches.    Objective:  BP (!) 198/87   Pulse 69   Temp 97.4 F (36.3 C) (Oral)   Ht '6\' 1"'$   (1.854 m)   Wt 197 lb (89.4 kg)   BMI 25.99 kg/m   BP Readings from Last 3 Encounters:  11/07/16 (!) 198/87  11/07/16 (!) 251/141  10/30/16 (!) 142/84    Wt Readings from Last 3 Encounters:  11/07/16 197 lb (89.4 kg)  11/07/16 190 lb (86.2 kg)  10/30/16 198 lb (89.8 kg)     Physical Exam  Constitutional: He appears well-developed and well-nourished.  HENT:  Head: Normocephalic and atraumatic.  Right Ear: Tympanic membrane and external ear normal. No decreased hearing is noted.  Left Ear: Tympanic membrane and external ear normal. No decreased hearing is noted.  Mouth/Throat: No oropharyngeal exudate or posterior oropharyngeal erythema.  Eyes: Pupils are equal, round, and reactive to light.  Neck: Normal range of motion. Neck supple.  Cardiovascular: Normal rate and regular rhythm.   No murmur heard. Pulmonary/Chest: Breath sounds normal. No respiratory distress.  Abdominal: Soft. Bowel sounds are normal. He exhibits no mass. There is no tenderness.  Vitals reviewed.    Lab Results  Component Value Date   WBC 8.3 10/13/2016   HGB 14.5 10/13/2016   HCT 43.8 10/13/2016   PLT 210.0 10/13/2016   GLUCOSE 295 (H) 08/28/2016   CHOL 176 07/25/2016   TRIG  125 07/25/2016   HDL 42 07/25/2016   LDLCALC 109 (H) 07/25/2016   ALT 12 08/28/2016   AST 13 08/28/2016   NA 140 08/28/2016   K 4.2 08/28/2016   CL 98 08/28/2016   CREATININE 0.88 08/28/2016   BUN 15 08/28/2016   CO2 28 08/28/2016   TSH 4.730 (H) 02/17/2015   INR 1.0 10/13/2016   HGBA1C 7.6 02/15/2015    No results found.  Assessment & Plan:   Timothy Guerrero was seen today for hypertension.  Diagnoses and all orders for this visit:  Hemoptysis -     CBC with Differential/Platelet -     CMP14+EGFR  Accelerated hypertension -     CMP14+EGFR  Blood loss anemia -     CBC with Differential/Platelet -     CMP14+EGFR  Hypothyroidism, unspecified type -     CMP14+EGFR -     TSH + free T4  Other orders -      hydrALAZINE (APRESOLINE) 50 MG tablet; Take 1 tablet (50 mg total) by mouth 2 (two) times daily. -     levothyroxine (SYNTHROID, LEVOTHROID) 50 MCG tablet; Take 1 tablet (50 mcg total) by mouth daily before breakfast. ((for thyroid))   I have changed Timothy Guerrero's levothyroxine. I am also having him start on hydrALAZINE. Additionally, I am having him maintain his Insulin Glargine, furosemide, potassium chloride, valACYclovir, irbesartan, pantoprazole, metFORMIN, glipiZIDE, CENTRUM SILVER 50+MEN, metoprolol succinate, metoprolol succinate, amLODipine, and dextromethorphan-guaiFENesin.  Records from Dr. Milinda Hirschfeld were reviewed and summarized. Previous medical record reviewed and summarized above. Blood work ordered. Regarding risk of complication this has the appearance of a chronic disease/illness posing threat to life urgently.   Patient states that she had side effects from clonidine so instead he is being placed on hydralazine. He is aware that hydralazine has some significant side effects and he is given a monitor for dizziness and drowsiness headache etc. And he will follow-up with Dr. [name] in a week to adjust the dose and he would side effects. He is also aware that it probably will not completely lowers blood pressure back to baseline and he will need dose adjustments once it's tolerated.  He will continue his anemia and blood loss workup with Dr. [name]. Bronchoscopy will be rescheduled with Dr. Milinda Hirschfeld once his blood pressures under good control. He should contact Dr. Milinda Hirschfeld as needed should the hemoptysis become more profuse. Follow-up: Return in about 1 week (around 11/14/2016) for hypertension with Dr. [name].  Claretta Fraise, M.D.

## 2016-11-08 LAB — CMP14+EGFR
ALT: 12 IU/L (ref 0–44)
AST: 14 IU/L (ref 0–40)
Albumin/Globulin Ratio: 1.6 (ref 1.2–2.2)
Albumin: 3.9 g/dL (ref 3.5–4.8)
Alkaline Phosphatase: 70 IU/L (ref 39–117)
BUN/Creatinine Ratio: 15 (ref 10–24)
BUN: 12 mg/dL (ref 8–27)
Bilirubin Total: 0.5 mg/dL (ref 0.0–1.2)
CO2: 27 mmol/L (ref 18–29)
Calcium: 9.4 mg/dL (ref 8.6–10.2)
Chloride: 105 mmol/L (ref 96–106)
Creatinine, Ser: 0.82 mg/dL (ref 0.76–1.27)
GFR calc Af Amer: 100 mL/min/{1.73_m2} (ref >59)
GFR calc non Af Amer: 87 mL/min/{1.73_m2} (ref >59)
Globulin, Total: 2.5 g/dL (ref 1.5–4.5)
Glucose: 314 mg/dL — ABNORMAL HIGH (ref 65–99)
Potassium: 4.2 mmol/L (ref 3.5–5.2)
Sodium: 143 mmol/L (ref 134–144)
Total Protein: 6.4 g/dL (ref 6.0–8.5)

## 2016-11-08 LAB — CBC WITH DIFFERENTIAL/PLATELET
Basophils Absolute: 0 10*3/uL (ref 0.0–0.2)
Basos: 1 %
EOS (ABSOLUTE): 0.3 10*3/uL (ref 0.0–0.4)
Eos: 4 %
Hematocrit: 44.1 % (ref 37.5–51.0)
Hemoglobin: 14.3 g/dL (ref 13.0–17.7)
Immature Grans (Abs): 0 10*3/uL (ref 0.0–0.1)
Immature Granulocytes: 0 %
Lymphocytes Absolute: 1.5 10*3/uL (ref 0.7–3.1)
Lymphs: 21 %
MCH: 30 pg (ref 26.6–33.0)
MCHC: 32.4 g/dL (ref 31.5–35.7)
MCV: 93 fL (ref 79–97)
Monocytes Absolute: 0.7 10*3/uL (ref 0.1–0.9)
Monocytes: 11 %
Neutrophils Absolute: 4.4 10*3/uL (ref 1.4–7.0)
Neutrophils: 63 %
Platelets: 251 10*3/uL (ref 150–379)
RBC: 4.76 x10E6/uL (ref 4.14–5.80)
RDW: 16.3 % — ABNORMAL HIGH (ref 12.3–15.4)
WBC: 7 10*3/uL (ref 3.4–10.8)

## 2016-11-08 LAB — TSH+FREE T4
Free T4: 1.26 ng/dL (ref 0.82–1.77)
TSH: 2.69 u[IU]/mL (ref 0.450–4.500)

## 2016-11-08 NOTE — Telephone Encounter (Signed)
That was quite quick given his medications were just changed yesterday. Please have him come in sometime this week, at his convenience, to have his BP checked by staff with both a manual and automated cuff. Preferably Thursday or Friday. Let me know the result via Epic please. Then please schedule a bronchoscopy with airway inspection and possible biopsy at [facility] at 7am any morning next week except Monday. Thanks.

## 2016-11-08 NOTE — Telephone Encounter (Signed)
lmtcb X1 for pt to make aware of recs.  Per TL's, we cannot do a nurse visit to check BP; pt needs to be scheduled for an OV with a provider for BP check.  Also, we do not have automated cuff in office.  If pt has one, pt will need to bring this to check against manual reading.  Will call to schedule bronch after speaking to pt- need to ensure that pt can make bronch any day but Monday next week .

## 2016-11-08 NOTE — Telephone Encounter (Signed)
JN  Please Advise-  According to previous messages pt can not just stop by for a nurse visit he would need an ov with physician and we do not have an automatic cuff. Spoke with pt and he states he lives close to [place] and that it would be an hour drive and is hesitant to have an ov and have to pay a copay just for his blood pressure to be checked.

## 2016-11-08 NOTE — Telephone Encounter (Signed)
lmom tcb x1 to pt  

## 2016-11-08 NOTE — Telephone Encounter (Signed)
Patient returned phone call..ert ° °

## 2016-11-08 NOTE — Telephone Encounter (Signed)
He has an appointment with me next week on 5/15. Can we push this out for another 2-3 weeks. Go ahead and schedule the bronch for next week as previously described. Thanks.

## 2016-11-08 NOTE — Telephone Encounter (Signed)
Will hold in triage to be followed up on 11/09/16 as Resp Dept is closed as of 430 -- I called and got voicemail.

## 2016-11-09 NOTE — Telephone Encounter (Signed)
[place] with pt and advised him of your message. Pt's f/u appt has been moved and he requested the bronch be on Friday of next week and 7am was fine. Tara rescheduled pt's bronch to [facility] for May 18 at 7am.  Please let us know if you need anything further

## 2016-11-09 NOTE — Telephone Encounter (Signed)
Pt calling back because he did not receive  A call back on yesterday, please advise.Hillery Hunter'

## 2016-11-10 NOTE — Telephone Encounter (Signed)
Thank you. Pre-bronch orders placed fyi.

## 2016-11-14 ENCOUNTER — Ambulatory Visit: Payer: Medicare HMO | Admitting: Pulmonary Disease

## 2016-11-16 ENCOUNTER — Telehealth: Payer: Self-pay | Admitting: Pulmonary Disease

## 2016-11-16 NOTE — Telephone Encounter (Signed)
lmtcb X1 for pt. Pt is scheduled for bronch tomorrow at 7:00.

## 2016-11-17 ENCOUNTER — Encounter (HOSPITAL_COMMUNITY)
Admission: RE | Disposition: A | Discharge status: Home / Self Care | Payer: Self-pay | Source: Ambulatory Visit | Attending: Pulmonary Disease

## 2016-11-17 ENCOUNTER — Ambulatory Visit (HOSPITAL_COMMUNITY)
Admission: RE | Admit: 2016-11-17 | Discharge: 2016-11-17 | Disposition: A | Discharge status: Home / Self Care | Payer: Medicare HMO | Source: Ambulatory Visit | Attending: Pulmonary Disease | Admitting: Pulmonary Disease

## 2016-11-17 ENCOUNTER — Encounter (HOSPITAL_COMMUNITY): Payer: Self-pay | Admitting: Respiratory Therapy

## 2016-11-17 DIAGNOSIS — J449 Chronic obstructive pulmonary disease, unspecified: Secondary | ICD-10-CM | POA: Diagnosis not present

## 2016-11-17 DIAGNOSIS — Z825 Family history of asthma and other chronic lower respiratory diseases: Secondary | ICD-10-CM | POA: Insufficient documentation

## 2016-11-17 DIAGNOSIS — Z8546 Personal history of malignant neoplasm of prostate: Secondary | ICD-10-CM | POA: Insufficient documentation

## 2016-11-17 DIAGNOSIS — Z96653 Presence of artificial knee joint, bilateral: Secondary | ICD-10-CM | POA: Diagnosis not present

## 2016-11-17 DIAGNOSIS — R042 Hemoptysis: Secondary | ICD-10-CM | POA: Insufficient documentation

## 2016-11-17 DIAGNOSIS — G250 Essential tremor: Secondary | ICD-10-CM | POA: Insufficient documentation

## 2016-11-17 DIAGNOSIS — Z8042 Family history of malignant neoplasm of prostate: Secondary | ICD-10-CM | POA: Diagnosis not present

## 2016-11-17 DIAGNOSIS — G4730 Sleep apnea, unspecified: Secondary | ICD-10-CM | POA: Diagnosis not present

## 2016-11-17 DIAGNOSIS — K219 Gastro-esophageal reflux disease without esophagitis: Secondary | ICD-10-CM | POA: Diagnosis not present

## 2016-11-17 DIAGNOSIS — Z803 Family history of malignant neoplasm of breast: Secondary | ICD-10-CM | POA: Insufficient documentation

## 2016-11-17 DIAGNOSIS — Z96642 Presence of left artificial hip joint: Secondary | ICD-10-CM | POA: Insufficient documentation

## 2016-11-17 DIAGNOSIS — Z8249 Family history of ischemic heart disease and other diseases of the circulatory system: Secondary | ICD-10-CM | POA: Insufficient documentation

## 2016-11-17 DIAGNOSIS — Z902 Acquired absence of lung [part of]: Secondary | ICD-10-CM | POA: Insufficient documentation

## 2016-11-17 DIAGNOSIS — E7800 Pure hypercholesterolemia, unspecified: Secondary | ICD-10-CM | POA: Diagnosis not present

## 2016-11-17 DIAGNOSIS — I509 Heart failure, unspecified: Secondary | ICD-10-CM | POA: Insufficient documentation

## 2016-11-17 DIAGNOSIS — Z85118 Personal history of other malignant neoplasm of bronchus and lung: Secondary | ICD-10-CM | POA: Diagnosis not present

## 2016-11-17 DIAGNOSIS — Z794 Long term (current) use of insulin: Secondary | ICD-10-CM | POA: Diagnosis not present

## 2016-11-17 DIAGNOSIS — Z96612 Presence of left artificial shoulder joint: Secondary | ICD-10-CM | POA: Insufficient documentation

## 2016-11-17 DIAGNOSIS — Z809 Family history of malignant neoplasm, unspecified: Secondary | ICD-10-CM | POA: Diagnosis not present

## 2016-11-17 DIAGNOSIS — E119 Type 2 diabetes mellitus without complications: Secondary | ICD-10-CM | POA: Diagnosis not present

## 2016-11-17 DIAGNOSIS — Z85820 Personal history of malignant melanoma of skin: Secondary | ICD-10-CM | POA: Insufficient documentation

## 2016-11-17 DIAGNOSIS — Z79899 Other long term (current) drug therapy: Secondary | ICD-10-CM | POA: Insufficient documentation

## 2016-11-17 DIAGNOSIS — I110 Hypertensive heart disease with heart failure: Secondary | ICD-10-CM | POA: Diagnosis not present

## 2016-11-17 HISTORY — PX: VIDEO BRONCHOSCOPY: SHX5072

## 2016-11-17 LAB — GLUCOSE, CAPILLARY: Glucose-Capillary: 174 mg/dL — ABNORMAL HIGH (ref 65–99)

## 2016-11-17 SURGERY — VIDEO BRONCHOSCOPY WITHOUT FLUORO
Anesthesia: Moderate Sedation | Laterality: Bilateral

## 2016-11-17 MED ORDER — LIDOCAINE HCL 1 % IJ SOLN
INTRAMUSCULAR | Status: DC | PRN
  Administered 2016-11-17: 10 mL

## 2016-11-17 MED ORDER — FENTANYL CITRATE (PF) 100 MCG/2ML IJ SOLN
INTRAMUSCULAR | Status: AC
  Filled 2016-11-17: qty 4

## 2016-11-17 MED ORDER — FENTANYL CITRATE (PF) 100 MCG/2ML IJ SOLN
INTRAMUSCULAR | Status: DC | PRN
  Administered 2016-11-17: 50 ug via INTRAVENOUS

## 2016-11-17 MED ORDER — SODIUM CHLORIDE 0.9 % IV SOLN
Freq: Once | INTRAVENOUS | Status: AC
  Administered 2016-11-17: 07:00:00 via INTRAVENOUS

## 2016-11-17 MED ORDER — MIDAZOLAM HCL 5 MG/ML IJ SOLN
INTRAMUSCULAR | Status: AC
  Filled 2016-11-17: qty 2

## 2016-11-17 MED ORDER — MIDAZOLAM HCL 10 MG/2ML IJ SOLN
INTRAMUSCULAR | Status: DC | PRN
  Administered 2016-11-17: 2 mg via INTRAVENOUS

## 2016-11-17 NOTE — Op Note (Signed)
Video Bronchoscopy Procedure Note  Pre-Procedure Diagnoses: 1.  Hemoptysis  Post-Procedure Diagnoses: 1.  Hemoptysis  Procedures Performed: 1. Bronchoscopy with Airway Inspection  Consent:  Informed consent was obtained from the patient after discussing the risks and benefits of the procedure including bleeding, infection, pneumothorax, medication allergy, vocal chord injury, and potentially death.  Conscious Sedation:   Time of First Medication Administration:  7:29 am Time of Last Medication Administration:  7:29 am Time Physicial Left Room:  7:50 am  Medications Administered During Conscious Sedation: 1. Lidocaine 1% gargle 10cc 2. Lidocaine 1% 12cc via bronchoscope 3. Versed 2 mg IV 4. Fentanyl 50 mcg IV  Description of Procedure: Patient was brought back to the endoscopy procedure room.  A time out was performed to identify the correct patient and procedure.  Lidocaine gargle was performed.  Patient was laid recumbent and conscious sedation was administered by respiratory therapy under my direction.  Bite block was inserted and towel was placed over the patient's eyes.  Flexible bronchoscope was then inserted into the posterior pharynx until vocal chords were in full view. There was no abnormality of the vocal chords, arytenoids, or epiglottis. No evidence of mucosal abnormality or bleeding lesion in the supraglottic area.  A total of 6cc of Lidocaine was used to anesthetize the vocal chords.  The bronchoscope was then inserted between the vocal chords with ease into the proximal trachea.  Lidocaine was then used to anesthetize the patient's proximal airways.  An airway inspeciton was performed finding normal mucosa without lesion or mass. Patient's prior resection stump from his right lower lobectomy was in tact without any lesion. A photograph was taken of the prior resection site. There was only a small amount of thin, clear secretions without any evidence of bloody secretions from  any of the patient's airways. The flexible bronchoscope was then removed from the patient's airways after suctioning of the remaining secretions.  The bite block was removed and the patient was returned to the upright position.  Blood Loss:  None.  Complications:  None.  Post Procedure Instructions: Personally spoke with the patient's friend relaying the preliminary results of the procedure.  Instructed him that the patient is not to operate a car for 24 hours.  The patient should seek immediate medical attention if there is any persistent or progressive hemoptysis, difficulty breathing, or chest pain/discomfort.  The patient should also notify me immediately or seek medical attention for any purulent sputum production or fever occuring today or in the coming days.  The patient will be contacted by me once the final results of the studies are available.  The patient should call my office if they have any questions. We will plan on checking a maxillofacial CT scan without contrast and consider a referral to GI versus ENT pending that result.   Sonia Baller Ashok Cordia, M.D. [facility] Pager:  [phone] After 3pm or if no response, call [phone] 7:53 AM 11/17/16

## 2016-11-17 NOTE — Progress Notes (Signed)
Video bronchoscopy performed.  No interventions performed.  Airway exam only.  No complications noted.

## 2016-11-17 NOTE — H&P (Signed)
[facility]  Date of Procedure:  11/17/2016  Reason for Procedure/Chief Complaint:  Hemoptysis  History of Presenting Illness:  76 y.o. patient continuing to have hemoptysis. Denies any chest pain or pressure. Denies any fever, chills, or sweats. Denies any headache or vision changes. No focal weakness. Patient had blood pressure regimen adjusted by primary care physician. Blood pressure is better controlled today. No hematuria or dysuria. No hematochezia or melena.  Review of Systems: No rashes or bruising. No abdominal pain or nausea. A pertinent 14 point review of systems is negative except as per the history of presenting illness.  No Known Allergies  No current facility-administered medications on file prior to encounter.    Current Outpatient Prescriptions on File Prior to Encounter  Medication Sig Dispense Refill  . amLODipine (NORVASC) 10 MG tablet Take 10 mg by mouth daily. ((blood pressure))    . furosemide (LASIX) 20 MG tablet Take 1 tablet (20 mg total) by mouth daily. 10 tablet 0  . glipiZIDE (GLUCOTROL) 10 MG tablet Take 10 mg by mouth daily before breakfast. ((for blood sugar))    . hydrALAZINE (APRESOLINE) 50 MG tablet Take 1 tablet (50 mg total) by mouth 2 (two) times daily. 60 tablet 0  . Insulin Glargine (LANTUS SOLOSTAR) 100 UNIT/ML Solostar Pen Inject 20-40 Units into the skin daily at 10 pm. (Patient taking differently: Inject 30 Units into the skin daily. ) 5 pen PRN  . irbesartan (AVAPRO) 300 MG tablet Take 1 tablet (300 mg total) by mouth daily. 30 tablet 11  . levothyroxine (SYNTHROID, LEVOTHROID) 50 MCG tablet Take 1 tablet (50 mcg total) by mouth daily before breakfast. ((for thyroid)) 90 tablet 1  . metFORMIN (GLUCOPHAGE) 1000 MG tablet Take 1,000 mg by mouth 2 (two) times daily with a meal. ((for blood sugar))    . Multiple Vitamins-Minerals (CENTRUM SILVER 50+MEN) TABS Take 1 tablet by mouth daily.    . potassium chloride (K-DUR) 10 MEQ  tablet Take 1 tablet (10 mEq total) by mouth daily. Take only on days when taking lasix 90 tablet 3  . dextromethorphan-guaiFENesin (MUCINEX DM) 30-600 MG 12hr tablet Take 1 tablet by mouth daily as needed for cough. ((for cough, congestion, mucus))     . pantoprazole (PROTONIX) 40 MG tablet Take 30- 60 min before your first and last meals of the day (Patient taking differently: Take 40 mg by mouth 2 (two) times daily. Take 30- 60 min before your first and last meals of the day) 60 tablet 3  . valACYclovir (VALTREX) 1000 MG tablet Take 1,000 mg by mouth as needed (cold sores).       Past Medical History:  Diagnosis Date  . Anemia   . Arthritis   . Atrial flutter ([place])    post op following lobectomy  . Benign essential tremor   . Blood transfusion without reported diagnosis   . Cataract   . CHF (congestive heart failure) ([place])   . Colon polyp    2007, polyp with early cancer, one year f/u no residual polyp. next TCS 2010, see PSH. [facility].  [name] COPD (chronic obstructive pulmonary disease) ([place])   . Diabetes mellitus without complication ([place])    10 years  . GERD (gastroesophageal reflux disease)   . HOH (hard of hearing)   . Hypercholesteremia   . Hypertension    10 years  . Lung cancer ([place])   . Prostate cancer ([place])   . Shortness of breath dyspnea  due to lung mass  . Skin melanoma ([place])   . Sleep apnea    wears CPAP sometimes, cannot tolerate all the time.    Past Surgical History:  Procedure Laterality Date  . bottom lobe of right lung removed  12/17/14  . CARDIAC CATHETERIZATION     no PCI  . CATARACT EXTRACTION Left   . CATARACT EXTRACTION W/PHACO Left 01/03/2016   Procedure: CATARACT EXTRACTION PHACO AND INTRAOCULAR LENS PLACEMENT LEFT EYE CDE=7.78;  Surgeon: Williams Che, MD;  Location: [facility];  Service: Ophthalmology;  Laterality: Left;  . CATARACT EXTRACTION W/PHACO Right 03/20/2016   Procedure: CATARACT EXTRACTION PHACO AND INTRAOCULAR LENS  PLACEMENT; CDE:  9.58;  Surgeon: Williams Che, MD;  Location: [facility];  Service: Ophthalmology;  Laterality: Right;  . COLONOSCOPY  08/2008   Dr. Leafy Half, [place]: normal, internal grade 1 hemorrhoids. next TCS 08/2013  . COLONOSCOPY N/A 03/04/2014   Procedure: COLONOSCOPY;  Surgeon: Daneil Dolin, MD;  Location: [facility];  Service: Endoscopy;  Laterality: N/A;  7:30am  . EYE SURGERY    . knee replacements     bilateral. over 10 years ago  . LOBECTOMY Right 12/17/2014   Procedure: RIGHT LOWER LOBECTOMY;  Surgeon: Melrose Nakayama, MD;  Location: [facility];  Service: Thoracic;  Laterality: Right;  . LYMPH NODE DISSECTION Right 12/17/2014   Procedure: LYMPH NODE DISSECTION;  Surgeon: Melrose Nakayama, MD;  Location: [facility];  Service: Thoracic;  Laterality: Right;  . NECK SURGERY     melanoma removed.  [name] PROSTATE SURGERY     prostatectomy, radical  . TOTAL HIP ARTHROPLASTY  2007   left  . total shoulder replacement  2011   left  . VIDEO ASSISTED THORACOSCOPY (VATS)/WEDGE RESECTION Right 12/17/2014   Procedure: RIGHT VIDEO ASSISTED THORACOSCOPY (VATS);  Surgeon: Melrose Nakayama, MD;  Location: [facility];  Service: Thoracic;  Laterality: Right;    Family History  Problem Relation Age of Onset  . Cancer Mother        unknown type  . COPD Sister   . Breast cancer Sister   . Cancer Sister 28       breast  . Prostate cancer Son        died age 57  . Heart attack Daughter   . Colon cancer Neg Hx     Social History   Social History  . Marital status: Divorced    Spouse name: N/A  . Number of children: 3  . Years of education: N/A   Occupational History  . retired    Social History Main Topics  . Smoking status: Former Smoker    Packs/day: 2.00    Years: 32.00    Types: Cigarettes    Quit date: 07/03/1986  . Smokeless tobacco: Never Used  . Alcohol use No     Comment: rarely wine  . Drug use: No  . Sexual activity: Yes   Other Topics  Concern  . None   Social History Engineer, materials.  Lives alone.      [facility]:   Originally from [facility]. Served in [employer] with significant exposure to asbestos. 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 He has also lived in [place], [place], & [place]. He has lived in [place] since 1979. 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 No pets currently. Remote bird exposure.     Temp:  [98.4 F (36.9 C)]  98.4 F (36.9 C) (05/18 0718) Pulse Rate:  [77] 77 (05/18 0654) Resp:  [15-19] 19 (05/18 0715) BP: (158-186)/(70-80) 172/70 (05/18 0715) SpO2:  [99 %-100 %] 100 % (05/18 0715) Weight:  [197 lb (89.4 kg)] 197 lb (89.4 kg) (05/18 0654)  General:  Awake. Alert. No acute distress.  Integument:  Warm & dry. No rash on exposed skin.  Extremities:  No cyanosis or clubbing.  Lymphatics:  No appreciated cervical or supraclavicular lymphadenoapthy. HEENT:  Moist mucus membranes. No oral ulcers. No scleral injection or icterus.  Cardiovascular:  Regular rate & rhythm. No edema. No JVD appreciated.  Pulmonary:  Good aeration & clear to auscultation bilaterally. Symmetric chest wall expansion. No accessory muscle use. Abdomen: Soft. Normal bowel sounds. Nondistended. Nontender. Musculoskeletal:  Normal bulk and tone. Hand grip strength 5/5 bilaterally. No joint deformity or effusion appreciated. Neurological:  Cranial nerves 2-12 grossly in tact. No meningismus. Moving all 4 extremities equally.  Psychiatric:  Mood and affect congruent. Speech normal rhythm, rate & tone.   CBC Latest Ref Rng & Units 11/07/2016 10/13/2016 08/28/2016  WBC 3.4 - 10.8 x10E3/uL 7.0 8.3 8.1  Hemoglobin 13.0 - 17.0 g/dL - 14.5 -  Hematocrit 37.5 - 51.0 % 44.1 43.8 47.7  Platelets 150 - 379 x10E3/uL 251 210.0 246    BMP Latest Ref Rng & Units 11/07/2016 08/28/2016 07/25/2016  Glucose 65 - 99 mg/dL 314(H) 295(H) 76  BUN 8 - 27 mg/dL '12 15 11  '$ Creatinine 0.76 - 1.27 mg/dL 0.82 0.88 0.86  BUN/Creat Ratio 10 - '24 15 17 13  '$ Sodium 134 -  144 mmol/L 143 140 148(H)  Potassium 3.5 - 5.2 mmol/L 4.2 4.2 4.0  Chloride 96 - 106 mmol/L 105 98 104  CO2 18 - 29 mmol/L '27 28 25  '$ Calcium 8.6 - 10.2 mg/dL 9.4 9.8 10.3(H)    INR/Prothrombin Time (10/13/16):  1.0 PTT (10/13/16):  28.1  PFT 09/08/16: FVC 4.21 L (92%) FEV1 2.60 L (70%) FEV1/FVC 0.62 FEF 25-75 1.31 L (84%) negative bronchodilator response 05/17/16: FVC 3.47 L (73%) FEV1 2.12 L (61%) FEV1/FVC 0.61 FEF 25-75 1.05 L (41%) positive bronchodilator response TLC 5.67 L (74%) RV 70% ERV 69% DLCO uncorrected 43%  6MWT 06/13/16: Walked 336 meters / Baseline Sat 98% on RA / Nadir Sat 97% on RA  IMAGING CT CHEST W/O 09/01/16 (per radiologist): Status post right lower lobectomy without evidence of recurrent disease. Resolution of previously seen right-sided pleural effusion. Scattered calcified nodules consistent with prior granulomatous disease. There are a few scattered noncalcified or partially calcified nodules which are stable from multiple previous exams consistent with a benign etiology. Stable calcified pleural plaques.   CT CHEST W/O 06/08/16 (previously reviewed by me):Bilateral pleural plaques with some calcification. Subcentimeter pulmonary nodules again noted. Emphysema also again noted. Slight enlargement in right pleural effusion with small left pleural effusion. No pericardial effusion. Adrenal nodule also noted. No pathologic mediastinal adenopathy.  CT CHEST W/ 12/21/15 (previously reviewed by me): No pericardial effusion. Bilateral calcified pleural plaques. Trace right pleural effusion. Status post right lower lobectomy. Centrilobular and paraseptal emphysema that is apical predominant. 2 mm left upper lobe nodule stable since 2012. 3 mm left lower lobe nodule was not present on previous CT imaging. 1.6 cm right adrenal nodule consistent with imaging from 2012.  CARDIAC TTE (06/06/16):LV normal in size with mild LVH. EF 25-30% with diffuse hypokinesis. Grade 2  diastolic dysfunction. LA moderately dilated &RA normal in size. RV normal in size and function.  No aortic stenosis or regurgitation. Aortic root normal in size. Mild to moderate mitral regurgitation without stenosis. No pulmonic stenosis. Trivial tricuspid regurgitation. No pericardial effusion.  PATHOLOGY Right Lower Lobectomy (12/17/14):Right Pleural Biposy w/o malignancy but calcification present / 2.8 cm Invasive Poorly Differentiated Squamous Cell Carcinoma / Lymph Nodes Negative   LABS 05/15/16 CBC: 7.6/7.3/24.5/352 Ferritin: 6.6 Alpha-1 antitrypsin: MM (163)  ASSESSMENT/PLAN:  75 y.o. male with persistent hemoptysis & history of lung cancer. Patient's blood pressure is better controlled today. Hemoptysis persists. Patient agreeable to proceed with bronchoscopy and airway inspection.  Sonia Baller Ashok Cordia, M.D. [facility] Pager:  [phone] After 3pm or if no response, call [phone] 7:30 AM 11/17/16

## 2016-11-17 NOTE — Discharge Instructions (Signed)
Flexible Bronchoscopy, Care After These instructions give you information on caring for yourself after your procedure. Your doctor may also give you more specific instructions. Call your doctor if you have any problems or questions after your procedure. Follow these instructions at home:  Do not eat or drink anything for 2 hours after your procedure. If you try to eat or drink before the medicine wears off, food or drink could go into your lungs. You could also burn yourself.  After 2 hours have passed and when you can cough and gag normally, you may eat soft food and drink liquids slowly.  The day after the test, you may eat your normal diet.  You may do your normal activities.  Keep all doctor visits. Get help right away if:  You get more and more short of breath.  You get light-headed.  You feel like you are going to pass out (faint).  You have chest pain.  You have new problems that worry you.  You cough up more than a little blood.  You cough up more blood than before. This information is not intended to replace advice given to you by your health care provider. Make sure you discuss any questions you have with your health care provider. Document Released: 04/16/2009 Document Revised: 11/25/2015 Document Reviewed: 02/21/2013 Elsevier Interactive Patient Education  2017 [place] not eat or drink anything until 9:40 am on 11/17/2016.

## 2016-11-19 ENCOUNTER — Encounter (HOSPITAL_COMMUNITY): Payer: Self-pay | Admitting: Pulmonary Disease

## 2016-11-20 ENCOUNTER — Telehealth: Payer: Self-pay | Admitting: Pulmonary Disease

## 2016-11-20 DIAGNOSIS — R042 Hemoptysis: Secondary | ICD-10-CM

## 2016-11-20 NOTE — Telephone Encounter (Signed)
Looks like pt had his bronch left vm to insure pt did not need anything further

## 2016-11-20 NOTE — Telephone Encounter (Signed)
Pt called back and doesn't need anything else

## 2016-11-20 NOTE — Telephone Encounter (Signed)
A staff message was received from [place]: Will you please order a Maxillofacial CT Limited for the diagnosis of hemoptysis.  Thanks.   Pt was contacted and made aware of order being placed. He verbalized understanding and stated if it cost too much he will not be able to have it performed as he is on a fixed income. Order placed for CT maxillofacial. Nothing further is needed.

## 2016-11-29 ENCOUNTER — Ambulatory Visit (HOSPITAL_COMMUNITY)
Admission: RE | Admit: 2016-11-29 | Discharge: 2016-11-29 | Disposition: A | Discharge status: Home / Self Care | Payer: Medicare HMO | Source: Ambulatory Visit | Attending: Pulmonary Disease | Admitting: Pulmonary Disease

## 2016-11-29 DIAGNOSIS — J3489 Other specified disorders of nose and nasal sinuses: Secondary | ICD-10-CM | POA: Diagnosis not present

## 2016-11-29 DIAGNOSIS — R042 Hemoptysis: Secondary | ICD-10-CM | POA: Diagnosis not present

## 2016-11-29 DIAGNOSIS — Z85118 Personal history of other malignant neoplasm of bronchus and lung: Secondary | ICD-10-CM | POA: Diagnosis not present

## 2016-12-01 NOTE — Progress Notes (Signed)
Left message for patient to contact office.

## 2016-12-04 ENCOUNTER — Telehealth: Payer: Self-pay | Admitting: Pulmonary Disease

## 2016-12-04 NOTE — Telephone Encounter (Signed)
Results have been explained to patient, pt expressed understanding. Pt requesting to hold off on ENT referral until appt 12/07/16 with JN to discuss results. Nothing further needed.  Notes recorded by Javier Glazier, MD on 11/30/2016 at 4:18 PM EDT Please let the patient know that his sinus CT scan did show some mucosal thickening. Given this was not completely normal please put in a consult to ENT for evaluation for hemoptysis. I'm concerned that he could have some bleeding source above the supraglottic space and I was unable to examine during the patient's bronchoscopy. Thank you.

## 2016-12-04 NOTE — Telephone Encounter (Signed)
Patient called back and asked that we call after 12:30 pm today, as he will be outside.

## 2016-12-07 ENCOUNTER — Encounter: Payer: Self-pay | Admitting: Pulmonary Disease

## 2016-12-07 ENCOUNTER — Ambulatory Visit (INDEPENDENT_AMBULATORY_CARE_PROVIDER_SITE_OTHER): Payer: Medicare HMO | Admitting: Pulmonary Disease

## 2016-12-07 VITALS — BP 134/80 | HR 75 | Ht 73.0 in | Wt 195.8 lb

## 2016-12-07 DIAGNOSIS — Z77090 Contact with and (suspected) exposure to asbestos: Secondary | ICD-10-CM | POA: Diagnosis not present

## 2016-12-07 DIAGNOSIS — R918 Other nonspecific abnormal finding of lung field: Secondary | ICD-10-CM

## 2016-12-07 DIAGNOSIS — J449 Chronic obstructive pulmonary disease, unspecified: Secondary | ICD-10-CM

## 2016-12-07 DIAGNOSIS — C3491 Malignant neoplasm of unspecified part of right bronchus or lung: Secondary | ICD-10-CM | POA: Diagnosis not present

## 2016-12-07 DIAGNOSIS — R042 Hemoptysis: Secondary | ICD-10-CM

## 2016-12-07 NOTE — Patient Instructions (Signed)
   Continue using your albuterol inhaler as needed.  Call me if you start to cough up any more bloody mucus and want to go see ENT.  I will see you back in 6 months or sooner if needed.

## 2016-12-07 NOTE — Progress Notes (Signed)
Subjective:    Patient ID: Timothy Guerrero, male    DOB: 04-19-1941, 76 y.o.   MRN: [id number]  C.C.:  Follow-up for Moderate COPD w/ Emphysema, Hemoptysis, Right Lower Lobe Squamous Cell Carcinoma (Stage 1A NSCLC), Multiple Lung Nodules, Asbestos Exposure, & Mild Restrictive Lung Disease.  HPI Moderate COPD with emphysema: No symptomatic benefit from either Spiriva or Anoro. Continued on albuterol rescue inhaler last appointment. He denies any new dyspnea. He reports he is using his rescue inhaler once a week at most.   Hemoptysis: Patient developed another acute episode of hemoptysis in April and underwent fiberoptic bronchoscopy on 5/18 performed by myself. There is no source of bleeding or any blood whatsoever in the patient's airways. Maxillofacial CT scan did show some mucosal thickening in the paranasal sinuses. Patient reports his cough is still producing mucus with blood mixed in first thing in the morning. Denies any epistaxis.  Right lower lobe squamous cell carcinoma: Status post right lower lobectomy June 2016. Previous lobectomy stump examined during bronchoscopy and was intact without lesion.  Multiple lung nodules: Seen on CT imaging June 2017 without progression on December 2017 or March 2018 imaging.  Asbestos exposure: Patient does have previously documented pleural plaques. He does have prior asbestos exposure while in the [employer].   Mild restrictive lung disease: No suggestion of intersti tial lung disease on CT imaging. Likely multifactorial from lobectomy, congestive heart failure, and effective pleural effusion. No need for further testing.   Review of Systems No chest pain or pressure. No fever or chills. He reports only mild sinus congestion & drainage, primarily in the morning. No abdominal pain or nausea.   No Known Allergies  Current Outpatient Prescriptions on File Prior to Visit  Medication Sig Dispense Refill  . amLODipine (NORVASC) 10 MG tablet Take 10 mg  by mouth daily. ((blood pressure))    . dextromethorphan-guaiFENesin (MUCINEX DM) 30-600 MG 12hr tablet Take 1 tablet by mouth daily as needed for cough. ((for cough, congestion, mucus))     . furosemide (LASIX) 20 MG tablet Take 1 tablet (20 mg total) by mouth daily. 10 tablet 0  . glipiZIDE (GLUCOTROL) 10 MG tablet Take 10 mg by mouth daily before breakfast. ((for blood sugar))    . hydrALAZINE (APRESOLINE) 50 MG tablet Take 1 tablet (50 mg total) by mouth 2 (two) times daily. 60 tablet 0  . Insulin Glargine (LANTUS SOLOSTAR) 100 UNIT/ML Solostar Pen Inject 20-40 Units into the skin daily at 10 pm. (Patient taking differently: Inject 30 Units into the skin daily. ) 5 pen PRN  . irbesartan (AVAPRO) 300 MG tablet Take 1 tablet (300 mg total) by mouth daily. 30 tablet 11  . levothyroxine (SYNTHROID, LEVOTHROID) 50 MCG tablet Take 1 tablet (50 mcg total) by mouth daily before breakfast. ((for thyroid)) 90 tablet 1  . metFORMIN (GLUCOPHAGE) 1000 MG tablet Take 1,000 mg by mouth 2 (two) times daily with a meal. ((for blood sugar))    . metoprolol (TOPROL-XL) 200 MG 24 hr tablet Take 200 mg by mouth daily.    . Multiple Vitamins-Minerals (CENTRUM SILVER 50+MEN) TABS Take 1 tablet by mouth daily.    [name] omeprazole (PRILOSEC) 20 MG capsule Take 20 mg by mouth daily.    . pantoprazole (PROTONIX) 40 MG tablet Take 30- 60 min before your first and last meals of the day (Patient taking differently: Take 40 mg by mouth 2 (two) times daily. Take 30- 60 min before your first and last meals of  the day) 60 tablet 3  . valACYclovir (VALTREX) 1000 MG tablet Take 1,000 mg by mouth as needed (cold sores).     . potassium chloride (K-DUR) 10 MEQ tablet Take 1 tablet (10 mEq total) by mouth daily. Take only on days when taking lasix 90 tablet 3   No current facility-administered medications on file prior to visit.     Past Medical History:  Diagnosis Date  . Anemia   . Arthritis   . Atrial flutter ([place])    post op  following lobectomy  . Benign essential tremor   . Blood transfusion without reported diagnosis   . Cataract   . CHF (congestive heart failure) ([place])   . Colon polyp    2007, polyp with early cancer, one year f/u no residual polyp. next TCS 2010, see PSH. [facility].  [name] COPD (chronic obstructive pulmonary disease) ([place])   . Diabetes mellitus without complication ([place])    10 years  . GERD (gastroesophageal reflux disease)   . HOH (hard of hearing)   . Hypercholesteremia   . Hypertension    10 years  . Lung cancer ([place])   . Prostate cancer ([place])   . Shortness of breath dyspnea    due to lung mass  . Skin melanoma ([place])   . Sleep apnea    wears CPAP sometimes, cannot tolerate all the time.    Past Surgical History:  Procedure Laterality Date  . bottom lobe of right lung removed  12/17/14  . CARDIAC CATHETERIZATION     no PCI  . CATARACT EXTRACTION Left   . CATARACT EXTRACTION W/PHACO Left 01/03/2016   Procedure: CATARACT EXTRACTION PHACO AND INTRAOCULAR LENS PLACEMENT LEFT EYE CDE=7.78;  Surgeon: Williams Che, MD;  Location: [facility];  Service: Ophthalmology;  Laterality: Left;  . CATARACT EXTRACTION W/PHACO Right 03/20/2016   Procedure: CATARACT EXTRACTION PHACO AND INTRAOCULAR LENS PLACEMENT; CDE:  9.58;  Surgeon: Williams Che, MD;  Location: [facility];  Service: Ophthalmology;  Laterality: Right;  . COLONOSCOPY  08/2008   Dr. Leafy Half, [place]: normal, internal grade 1 hemorrhoids. next TCS 08/2013  . COLONOSCOPY N/A 03/04/2014   Procedure: COLONOSCOPY;  Surgeon: Daneil Dolin, MD;  Location: [facility];  Service: Endoscopy;  Laterality: N/A;  7:30am  . EYE SURGERY    . knee replacements     bilateral. over 10 years ago  . LOBECTOMY Right 12/17/2014   Procedure: RIGHT LOWER LOBECTOMY;  Surgeon: Melrose Nakayama, MD;  Location: [facility];  Service: Thoracic;  Laterality: Right;  . LYMPH NODE DISSECTION Right 12/17/2014   Procedure: LYMPH  NODE DISSECTION;  Surgeon: Melrose Nakayama, MD;  Location: [facility];  Service: Thoracic;  Laterality: Right;  . NECK SURGERY     melanoma removed.  [name] PROSTATE SURGERY     prostatectomy, radical  . TOTAL HIP ARTHROPLASTY  2007   left  . total shoulder replacement  2011   left  . VIDEO ASSISTED THORACOSCOPY (VATS)/WEDGE RESECTION Right 12/17/2014   Procedure: RIGHT VIDEO ASSISTED THORACOSCOPY (VATS);  Surgeon: Melrose Nakayama, MD;  Location: [facility];  Service: Thoracic;  Laterality: Right;  [name] VIDEO BRONCHOSCOPY Bilateral 11/17/2016   Procedure: VIDEO BRONCHOSCOPY WITHOUT FLUORO;  Surgeon: Javier Glazier, MD;  Location: [facility];  Service: Cardiopulmonary;  Laterality: Bilateral;    Family History  Problem Relation Age of Onset  . Cancer Mother        unknown  type  . COPD Sister   . Breast cancer Sister   . Cancer Sister 54       breast  . Prostate cancer Son        died age 40  . Heart attack Daughter   . Colon cancer Neg Hx     Social History   Social History  . Marital status: Divorced    Spouse name: N/A  . Number of children: 3  . Years of education: N/A   Occupational History  . retired    Social History Main Topics  . Smoking status: Former Smoker    Packs/day: 2.00    Years: 32.00    Types: Cigarettes    Quit date: 07/03/1986  . Smokeless tobacco: Never Used  . Alcohol use No     Comment: rarely wine  . Drug use: No  . Sexual activity: Yes   Other Topics Concern  . None   Social History Engineer, materials.  Lives alone.      [facility]:   Originally from [facility]. Served in [employer] with significant exposure to asbestos. 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 He has also lived in [place], [place], & [place]. He has lived in [place] since 1979. 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 No pets currently. Remote bird exposure.       Objective:   Physical Exam BP 134/80 (BP Location: Right Arm, Patient Position: Sitting, Cuff Size: Normal)   Pulse 75   Ht  6\' 1"  (1.854 m)   Wt 195 lb 12.8 oz (88.8 kg)   SpO2 96%   BMI 25.83 kg/m   General:  Awake. Alert. No distress. Caucasian male. Integument:  Warm & dry. No rash on exposed skin.  Extremities:  No cyanosis or clubbing.  HEENT:  Moist mucus membranes. Minimal nasal turbinate swelling. No oral ulcers. No epistaxis. Cardiovascular:  Regular rate. No edema. Regular rhythm.  Pulmonary:  Clear bilaterally to auscultation. No accessory muscle use on room air. Abdomen: Soft. Normal bowel sounds. Nondistended.  Musculoskeletal:  Normal bulk and tone. No joint deformity or effusion appreciated.  PFT 09/08/16: FVC 4.21 L (92%) FEV1 2.60 L (70%) FEV1/FVC 0.62 FEF 25-75 1.31 L (84%) negative bronchodilator response 05/17/16: FVC 3.47 L (73%) FEV1 2.12 L (61%) FEV1/FVC 0.61 FEF 25-75 1.05 L (41%) positive bronchodilator response TLC 5.67 L (74%) RV 70% ERV 69% DLCO uncorrected 43%  6MWT 06/13/16:  Walked 336 meters / Baseline Sat 98% on RA / Nadir Sat 97% on RA  IMAGING MAXILLOFACIAL CT LTD W/O 11/29/16 (per radiologist):  Trace paranasal sinus mucosal thickening.  CT CHEST W/O 09/01/16 (per radiologist):  Status post right lower lobectomy without evidence of recurrent disease. Resolution of previously seen right-sided pleural effusion. Scattered calcified nodules consistent with prior granulomatous disease. There are a few scattered noncalcified or partially calcified nodules which are stable from multiple previous exams consistent with a benign etiology. Stable calcified pleural plaques.   CT CHEST W/O 06/08/16 (previously reviewed by me): Bilateral pleural plaques with some calcification. Subcentimeter pulmonary nodules again noted. Emphysema also again noted. Slight enlargement in right pleural effusion with small left pleural effusion. No pericardial effusion. Adrenal nodule also noted. No pathologic mediastinal adenopathy.  CT CHEST W/ 12/21/15 (previously reviewed by me): No pericardial effusion.  Bilateral calcified pleural plaques. Trace right pleural effusion. Status post right lower lobectomy. Centrilobular and paraseptal emphysema that is apical predominant. 2 mm left upper lobe nodule  stable since 2012. 3 mm left lower lobe nodule was not present on previous CT imaging. 1.6 cm right adrenal nodule consistent with imaging from 2012.  CARDIAC TTE (06/06/16): LV normal in size with mild LVH. EF 25-30% with diffuse hypokinesis. Grade 2 diastolic dysfunction. LA moderately dilated & RA normal in size. RV normal in size and function. No aortic stenosis or regurgitation. Aortic root normal in size. Mild to moderate mitral regurgitation without stenosis. No pulmonic stenosis. Trivial tricuspid regurgitation. No pericardial effusion.  PATHOLOGY Right Lower Lobectomy (12/17/14):  Right Pleural Biposy w/o malignancy but calcification present / 2.8 cm Invasive Poorly Differentiated Squamous Cell Carcinoma / Lymph Nodes Negative   LABS 05/15/16 CBC:  7.6/7.3/24.5/352 Ferritin:  6.6 Alpha-1 antitrypsin: MM (163)  03/14/16 CBC:  9.5/9.2/30.6/381    Assessment & Plan:  76 y.o. male with moderate COPD with emphysema, hemoptysis, right lower lobe squamous cell carcinoma, & asbestos exposure. Patient does have multiple lung nodules which have been stable for some time now. Most of these nodules are calcified totally or at least partially. We did discuss the results of his bronchoscopy which did not show any source of his hemoptysis. The patient wishes to defer evaluation by ENT for now. We did discuss the possibility that this blood could represent a malignancy above the level at which I examined with the bronchoscope. Overall he seems to be asymptomatic from his underlying COPD. I instructed the patient contact my office if he wishes to undergo ENT evaluation, his breathing changed, or the amount of blood increased in his sputum.   1. Moderate COPD with emphysema: Continuing albuterol inhaler as  needed. 2. Hemoptysis: Patient declining ENT evaluation. Patient will notify me for any increased amount of blood. 3. Right lower lobe squamous cell carcinoma: Status post right lower lobectomy 2016. 4. Multiple lung nodules: Likely due to prior granulomatous exposure. Holding off on further imaging at this time. 5. Asbestos exposure: Only has evidence of pleural disease from previous exposure. 6. Health maintenance: Status post influenza vaccine October 2017, Prevnar January 2017, & Tdap October 2017. 7. Follow-up: Return to clinic in  6 months or sooner if needed.  Sonia Baller Ashok Cordia, M.D. [facility] Pager:  [phone] After 3pm or if no response, call [phone] 3:27 PM 12/07/16

## 2016-12-11 ENCOUNTER — Telehealth: Payer: Self-pay | Admitting: Family Medicine

## 2016-12-11 NOTE — Telephone Encounter (Signed)
Patient's question answered.

## 2017-01-09 DIAGNOSIS — D485 Neoplasm of uncertain behavior of skin: Secondary | ICD-10-CM | POA: Diagnosis not present

## 2017-01-09 DIAGNOSIS — L820 Inflamed seborrheic keratosis: Secondary | ICD-10-CM | POA: Diagnosis not present

## 2017-01-09 DIAGNOSIS — Z85820 Personal history of malignant melanoma of skin: Secondary | ICD-10-CM | POA: Diagnosis not present

## 2017-01-09 DIAGNOSIS — Z85828 Personal history of other malignant neoplasm of skin: Secondary | ICD-10-CM | POA: Diagnosis not present

## 2017-01-09 DIAGNOSIS — L570 Actinic keratosis: Secondary | ICD-10-CM | POA: Diagnosis not present

## 2017-01-31 ENCOUNTER — Telehealth: Payer: Self-pay | Admitting: Family Medicine

## 2017-01-31 MED ORDER — PANTOPRAZOLE SODIUM 40 MG PO TBEC: DELAYED_RELEASE_TABLET | ORAL | 3 refills | Status: DC

## 2017-01-31 NOTE — Telephone Encounter (Signed)
Go ahead and call in refill 

## 2017-01-31 NOTE — Addendum Note (Signed)
Addended by: Nigel Berthold C on: 01/31/2017 12:10 PM   Modules accepted: Orders

## 2017-01-31 NOTE — Telephone Encounter (Signed)
Rx sent- patient aware.

## 2017-01-31 NOTE — Telephone Encounter (Signed)
What is the name of the medication? Pantoprazole 40mg   Have you contacted your pharmacy to request a refill? no  Which pharmacy would you like this sent to? [facility]   Patient notified that their request is being sent to the clinical staff for review and that they should receive a call once it is complete. If they do not receive a call within 24 hours they can check with their pharmacy or our office.

## 2017-02-05 ENCOUNTER — Telehealth: Payer: Self-pay

## 2017-02-05 NOTE — Telephone Encounter (Signed)
x

## 2017-02-09 ENCOUNTER — Other Ambulatory Visit: Payer: Self-pay | Admitting: *Deleted

## 2017-02-16 ENCOUNTER — Encounter: Payer: Self-pay | Admitting: Family Medicine

## 2017-02-16 ENCOUNTER — Ambulatory Visit (INDEPENDENT_AMBULATORY_CARE_PROVIDER_SITE_OTHER): Payer: Medicare HMO | Admitting: Family Medicine

## 2017-02-16 VITALS — BP 167/73 | HR 73 | Temp 97.3°F | Ht 73.0 in | Wt 199.0 lb

## 2017-02-16 DIAGNOSIS — K219 Gastro-esophageal reflux disease without esophagitis: Secondary | ICD-10-CM | POA: Diagnosis not present

## 2017-02-16 DIAGNOSIS — I5022 Chronic systolic (congestive) heart failure: Secondary | ICD-10-CM | POA: Diagnosis not present

## 2017-02-16 DIAGNOSIS — E118 Type 2 diabetes mellitus with unspecified complications: Secondary | ICD-10-CM | POA: Diagnosis not present

## 2017-02-16 DIAGNOSIS — I10 Essential (primary) hypertension: Secondary | ICD-10-CM | POA: Diagnosis not present

## 2017-02-16 LAB — BAYER DCA HB A1C WAIVED: HB A1C (BAYER DCA - WAIVED): 9.4 % — ABNORMAL HIGH (ref <7.0)

## 2017-02-16 MED ORDER — FUROSEMIDE 20 MG PO TABS: 20.0000 mg | ORAL_TABLET | Freq: Every day | ORAL | 3 refills | Status: DC

## 2017-02-16 MED ORDER — GLIPIZIDE 10 MG PO TABS: 10.0000 mg | ORAL_TABLET | Freq: Every day | ORAL | 3 refills | Status: DC

## 2017-02-16 MED ORDER — IRBESARTAN 300 MG PO TABS: 300.0000 mg | ORAL_TABLET | Freq: Every day | ORAL | 3 refills | Status: DC

## 2017-02-16 MED ORDER — LEVOTHYROXINE SODIUM 50 MCG PO TABS: 50.0000 ug | ORAL_TABLET | Freq: Every day | ORAL | 3 refills | Status: DC

## 2017-02-16 MED ORDER — PANTOPRAZOLE SODIUM 40 MG PO TBEC: 40.0000 mg | DELAYED_RELEASE_TABLET | Freq: Every day | ORAL | 1 refills | Status: DC

## 2017-02-16 MED ORDER — POTASSIUM CHLORIDE ER 10 MEQ PO TBCR: 10.0000 meq | EXTENDED_RELEASE_TABLET | Freq: Every day | ORAL | 3 refills | Status: DC

## 2017-02-16 NOTE — Patient Instructions (Signed)
Great to see you!  Come back in 3-4 weeks for follow up blood pressure

## 2017-02-16 NOTE — Progress Notes (Signed)
   HPI  Patient presents today here with multiple medication concerns.  Patient explains that he's been out of several medications for a few weeks. He requested refill of GERD medication states he never received it.  HTN Patient did not take any medications today Denies chest pain, headache.  GERD Needs refill PPI, normally well controlled with PPI, uses when necessary and does not want to try H2 blocker.  Type 2 diabetes Average fasting blood sugar is 100 120 Taking 30 units of Lantus daily, no hypoglycemia, would like to decrease Also taking glipizide and metformin  CHF Normal breathing currently, needs refill of Lasix  PMH: Smoking status noted ROS: Per HPI  Objective: BP (!) 167/73   Pulse 73   Temp (!) 97.3 F (36.3 C) (Oral)   Ht 6\' 1"  (1.854 m)   Wt 199 lb (90.3 kg)   BMI 26.25 kg/m  Gen: NAD, alert, cooperative with exam HEENT: NCAT, CV: RRR, good S1/S2, no murmur Resp: CTABL, no wheezes, non-labored Ext: No edema, warm Neuro: Alert and oriented, No gross deficits  Assessment and plan:  # Hypertension Elevated today, however patient has not taken medications He is currently not taking ARB, restart Discontinue hydralazine for now, was taking 50 mg twice daily Continue amlodipine plus beta blocker Return to clinic in 3-4 weeks for recheck  # Type 2 diabetes Well-controlled according to his fasting blood sugar, A1c is pending Continue current medications, reduce Lantus dose if possible  # Chronic CHF Euvolemic today, lung exam and periphery are normal. Continue Lasix, beta blocker, ARB.  GERD Refill PPI, discussed [place] H2 blocker, he would not like to do this at this time   Orders Placed This Encounter  Procedures  . Bayer DCA Hb A1c Waived    Meds ordered this encounter  Medications  . DISCONTD: furosemide (LASIX) 20 MG tablet    Sig: Take 1 tablet (20 mg total) by mouth daily.    Dispense:  90 tablet    Refill:  3  . potassium chloride  (K-DUR) 10 MEQ tablet    Sig: Take 1 tablet (10 mEq total) by mouth daily. Take only on days when taking lasix    Dispense:  90 tablet    Refill:  3  . irbesartan (AVAPRO) 300 MG tablet    Sig: Take 1 tablet (300 mg total) by mouth daily.    Dispense:  90 tablet    Refill:  3  . levothyroxine (SYNTHROID, LEVOTHROID) 50 MCG tablet    Sig: Take 1 tablet (50 mcg total) by mouth daily before breakfast. ((for thyroid))    Dispense:  90 tablet    Refill:  3  . glipiZIDE (GLUCOTROL) 10 MG tablet    Sig: Take 1 tablet (10 mg total) by mouth daily before breakfast. ((for blood sugar))    Dispense:  90 tablet    Refill:  3  . furosemide (LASIX) 20 MG tablet    Sig: Take 1 tablet (20 mg total) by mouth daily.    Dispense:  90 tablet    Refill:  3  . pantoprazole (PROTONIX) 40 MG tablet    Sig: Take 1 tablet (40 mg total) by mouth daily.    Dispense:  90 tablet    Refill:  1    Please disregard previous order    [name], MD [facility] 02/16/2017, 11:52 AM

## 2017-02-20 ENCOUNTER — Telehealth: Payer: Self-pay | Admitting: Family Medicine

## 2017-02-20 NOTE — Progress Notes (Signed)
Subjective:    Patient ID: Timothy Guerrero, male    DOB: January 27, 1941, 76 y.o.   MRN: [id number]  C.C.:  Follow-up for Moderate COPD w/ Emphysema, Hemoptysis, Right Lower Lobe Squamous Cell Carcinoma (Stage 1A NSCLC), Multiple Lung Nodules, Asbestos Exposure, & Mild Restrictive Lung Disease.  HPI Moderate COPD with emphysema: No symptomatic benefit on Spiriva or Anoro. Continued on albuterol inhaler as needed at last appointment. Reports he is still having intermittent coughing. He denies any wheezing. He does have some burning in his right chest with his coughing. He reports his cough is more frequent. No exacerbations since last appointment.   Hemoptysis: Acute episode of hemoptysis in April with subsequent evaluation utilizing fiberoptic bronchoscopy in May. No visible source of bleeding but patient didn't have some mucosal thickening in the paranasal sinuses on CT imaging. Declined ENT evaluation & denied epistaxis. He reports the volume of hemoptysis has decreased and now occurring only rarely.   Right lower lobe squamous cell carcinoma: Status post right lower lobectomy June 2016. Previous lobectomy stump examined during bronchoscopy and was intact without lesion.  Multiple lung nodules: Seen on CT imaging June 2017 without progression on December 2017 or March 2018 imaging.  Asbestos exposure: Patient does have previously documented pleural plaques. He does have prior asbestos exposure while in the [employer].   Mild restrictive lung disease: No suggestion of intersti tial lung disease on CT imaging. Likely multifactorial from lobectomy, congestive heart failure, and effective pleural effusion. No need for further testing.   Review of Systems No fever, chills, or sweats. No abdominal pain, nausea or emesis. No new rashes or bruising.   No Known Allergies  Current Outpatient Prescriptions on File Prior to Visit  Medication Sig Dispense Refill  . amLODipine (NORVASC) 10 MG tablet Take 10  mg by mouth daily. ((blood pressure))    . furosemide (LASIX) 20 MG tablet Take 1 tablet (20 mg total) by mouth daily. 90 tablet 3  . glipiZIDE (GLUCOTROL) 10 MG tablet Take 1 tablet (10 mg total) by mouth daily before breakfast. ((for blood sugar)) 90 tablet 3  . Insulin Glargine (LANTUS SOLOSTAR) 100 UNIT/ML Solostar Pen Inject 20-40 Units into the skin daily at 10 pm. (Patient taking differently: Inject 30 Units into the skin daily. ) 5 pen PRN  . irbesartan (AVAPRO) 300 MG tablet Take 1 tablet (300 mg total) by mouth daily. 90 tablet 3  . levothyroxine (SYNTHROID, LEVOTHROID) 50 MCG tablet Take 1 tablet (50 mcg total) by mouth daily before breakfast. ((for thyroid)) 90 tablet 3  . metFORMIN (GLUCOPHAGE) 1000 MG tablet Take 1,000 mg by mouth 2 (two) times daily with a meal. ((for blood sugar))    . metoprolol (TOPROL-XL) 200 MG 24 hr tablet Take 200 mg by mouth daily.    . Multiple Vitamins-Minerals (CENTRUM SILVER 50+MEN) TABS Take 1 tablet by mouth daily.    . pantoprazole (PROTONIX) 40 MG tablet Take 1 tablet (40 mg total) by mouth daily. 90 tablet 1  . potassium chloride (K-DUR) 10 MEQ tablet Take 1 tablet (10 mEq total) by mouth daily. Take only on days when taking lasix 90 tablet 3  . valACYclovir (VALTREX) 1000 MG tablet Take 1,000 mg by mouth as needed (cold sores).      No current facility-administered medications on file prior to visit.     Past Medical History:  Diagnosis Date  . Anemia   . Arthritis   . Atrial flutter ([place])    post op following  lobectomy  . Benign essential tremor   . Blood transfusion without reported diagnosis   . Cataract   . CHF (congestive heart failure) ([place])   . Colon polyp    2007, polyp with early cancer, one year f/u no residual polyp. next TCS 2010, see PSH. [facility].  [name] COPD (chronic obstructive pulmonary disease) ([place])   . Diabetes mellitus without complication ([place])    10 years  . GERD (gastroesophageal reflux disease)   .  HOH (hard of hearing)   . Hypercholesteremia   . Hypertension    10 years  . Lung cancer ([place])   . Prostate cancer ([place])   . Shortness of breath dyspnea    due to lung mass  . Skin melanoma ([place])   . Sleep apnea    wears CPAP sometimes, cannot tolerate all the time.    Past Surgical History:  Procedure Laterality Date  . bottom lobe of right lung removed  12/17/14  . CARDIAC CATHETERIZATION     no PCI  . CATARACT EXTRACTION Left   . CATARACT EXTRACTION W/PHACO Left 01/03/2016   Procedure: CATARACT EXTRACTION PHACO AND INTRAOCULAR LENS PLACEMENT LEFT EYE CDE=7.78;  Surgeon: Williams Che, MD;  Location: [facility];  Service: Ophthalmology;  Laterality: Left;  . CATARACT EXTRACTION W/PHACO Right 03/20/2016   Procedure: CATARACT EXTRACTION PHACO AND INTRAOCULAR LENS PLACEMENT; CDE:  9.58;  Surgeon: Williams Che, MD;  Location: [facility];  Service: Ophthalmology;  Laterality: Right;  . COLONOSCOPY  08/2008   Dr. Leafy Half, [place]: normal, internal grade 1 hemorrhoids. next TCS 08/2013  . COLONOSCOPY N/A 03/04/2014   Procedure: COLONOSCOPY;  Surgeon: Daneil Dolin, MD;  Location: [facility];  Service: Endoscopy;  Laterality: N/A;  7:30am  . EYE SURGERY    . knee replacements     bilateral. over 10 years ago  . LOBECTOMY Right 12/17/2014   Procedure: RIGHT LOWER LOBECTOMY;  Surgeon: Melrose Nakayama, MD;  Location: [facility];  Service: Thoracic;  Laterality: Right;  . LYMPH NODE DISSECTION Right 12/17/2014   Procedure: LYMPH NODE DISSECTION;  Surgeon: Melrose Nakayama, MD;  Location: [facility];  Service: Thoracic;  Laterality: Right;  . NECK SURGERY     melanoma removed.  [name] PROSTATE SURGERY     prostatectomy, radical  . TOTAL HIP ARTHROPLASTY  2007   left  . total shoulder replacement  2011   left  . VIDEO ASSISTED THORACOSCOPY (VATS)/WEDGE RESECTION Right 12/17/2014   Procedure: RIGHT VIDEO ASSISTED THORACOSCOPY (VATS);  Surgeon: Melrose Nakayama, MD;   Location: [facility];  Service: Thoracic;  Laterality: Right;  [name] VIDEO BRONCHOSCOPY Bilateral 11/17/2016   Procedure: VIDEO BRONCHOSCOPY WITHOUT FLUORO;  Surgeon: Javier Glazier, MD;  Location: [facility];  Service: Cardiopulmonary;  Laterality: Bilateral;    Family History  Problem Relation Age of Onset  . Cancer Mother        unknown type  . COPD Sister   . Breast cancer Sister   . Cancer Sister 71       breast  . Prostate cancer Son        died age 31  . Heart attack Daughter   . Colon cancer Neg Hx     Social History   Social History  . Marital status: Divorced    Spouse name: N/A  . Number of children: 3  . Years of education: N/A   Occupational History  . retired  Social History Main Topics  . Smoking status: Former Smoker    Packs/day: 2.00    Years: 32.00    Types: Cigarettes    Quit date: 07/03/1986  . Smokeless tobacco: Never Used  . Alcohol use No     Comment: rarely wine  . Drug use: No  . Sexual activity: Yes   Other Topics Concern  . None   Social History Engineer, materials.  Lives alone.      [facility]:   Originally from [facility]. Served in [employer] with significant exposure to asbestos. 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 He has also lived in [place], [place], & [place]. He has lived in [place] since 1979. 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 No pets currently. Remote bird exposure.       Objective:   Physical Exam BP (!) 146/80 (BP Location: Left Arm, Cuff Size: Normal)   Pulse 84   Ht 6\' 1"  (1.854 m)   Wt 198 lb (89.8 kg)   SpO2 96%   BMI 26.12 kg/m   General:  Comfortable. No distress. Awake. Integument:  Warm & dry. No rash on exposed skin.  Lymphatics: No appreciated cervical or supraclavicular lymphadenopathy. HEENT:  Moist mucus membranes. No nasal turbinate swelling. No oral ulcers Cardiovascular:  Regular rate. No edema. Unable to appreciate JVD.  Pulmonary:  Clear bilaterally to auscultation. Good aeration bilaterally. Normal work  of breathing on room air. Abdomen: Soft. Normal bowel sounds. Nondistended.  Musculoskeletal:  Normal bulk and tone. No joint effusion appreciated.  PFT 09/08/16: FVC 4.21 L (92%) FEV1 2.60 L (70%) FEV1/FVC 0.62 FEF 25-75 1.31 L (84%) negative bronchodilator response 05/17/16: FVC 3.47 L (73%) FEV1 2.12 L (61%) FEV1/FVC 0.61 FEF 25-75 1.05 L (41%) positive bronchodilator response TLC 5.67 L (74%) RV 70% ERV 69% DLCO uncorrected 43%  6MWT 06/13/16:  Walked 336 meters / Baseline Sat 98% on RA / Nadir Sat 97% on RA  IMAGING MAXILLOFACIAL CT LTD W/O 11/29/16 (per radiologist):  Trace paranasal sinus mucosal thickening.  CT CHEST W/O 09/01/16 (per radiologist):  Status post right lower lobectomy without evidence of recurrent disease. Resolution of previously seen right-sided pleural effusion. Scattered calcified nodules consistent with prior granulomatous disease. There are a few scattered noncalcified or partially calcified nodules which are stable from multiple previous exams consistent with a benign etiology. Stable calcified pleural plaques.  CT CHEST W/O 06/08/16 (previously reviewed by me): Bilateral pleural plaques with some calcification. Subcentimeter pulmonary nodules again noted. Emphysema also again noted. Slight enlargement in right pleural effusion with small left pleural effusion. No pericardial effusion. Adrenal nodule also noted. No pathologic mediastinal adenopathy.  CT CHEST W/ 12/21/15 (previously reviewed by me): No pericardial effusion. Bilateral calcified pleural plaques. Trace right pleural effusion. Status post right lower lobectomy. Centrilobular and paraseptal emphysema that is apical predominant. 2 mm left upper lobe nodule stable since 2012. 3 mm left lower lobe nodule was not present on previous CT imaging. 1.6 cm right adrenal nodule consistent with imaging from 2012.  CARDIAC TTE (06/06/16): LV normal in size with mild LVH. EF 25-30% with diffuse hypokinesis. Grade 2  diastolic dysfunction. LA moderately dilated & RA normal in size. RV normal in size and function. No aortic stenosis or regurgitation. Aortic root normal in size. Mild to moderate mitral regurgitation without stenosis. No pulmonic stenosis. Trivial tricuspid regurgitation. No pericardial effusion.  PATHOLOGY Right Lower Lobectomy (12/17/14):  Right Pleural Biposy w/o malignancy but calcification  present / 2.8 cm Invasive Poorly Differentiated Squamous Cell Carcinoma / Lymph Nodes Negative   LABS 05/15/16 CBC:  7.6/7.3/24.5/352 Ferritin:  6.6 Alpha-1 antitrypsin: MM (163)  03/14/16 CBC:  9.5/9.2/30.6/381    Assessment & Plan:  76 y.o. male with hemoptysis having previously underwent bronchoscopy with moderate COPD with emphysema, multiple lung nodules, asbestos exposure, and history of right lower lobe squamous cell carcinoma post lobectomy in 2016. Patient's cough is likely due to some asthmatic component/reactive airways disease. I believe he may benefit from an inhaled corticosteroid. The volume and frequency of his hemoptysis has declined significantly. I instructed the patient to contact me if his cough returned following his inhaled corticosteroid or if this was ineffective.  1. Hemoptysis:  Mild. Suspect secondary to chronic cough. Previously declined ENT referral. 2. Moderate COPD with emphysema: Patient given samples of Breo 200 to try. He will contact my office for prescription if this is effective or ineffective for additional treatment options. Consider Singulair if ineffective. 3. Asbestos exposure: Evidence of pleural disease on imaging. 4. Health maintenance: Status Prevnar January 2017 & Tdap October 2017. 5. Follow-up: Return to clinic in 6 months or sooner if needed.  Sonia Baller Ashok Cordia, M.D. [facility] Pager:  [phone] After 3pm or if no response, call [phone] 10:14 AM 02/22/17

## 2017-02-21 NOTE — Telephone Encounter (Signed)
Patient aware of results on 08/21 by [facility]

## 2017-02-22 ENCOUNTER — Encounter: Payer: Self-pay | Admitting: Pulmonary Disease

## 2017-02-22 ENCOUNTER — Ambulatory Visit (INDEPENDENT_AMBULATORY_CARE_PROVIDER_SITE_OTHER): Payer: Medicare HMO | Admitting: Pulmonary Disease

## 2017-02-22 VITALS — BP 146/80 | HR 84 | Ht 73.0 in | Wt 198.0 lb

## 2017-02-22 DIAGNOSIS — R059 Cough, unspecified: Secondary | ICD-10-CM

## 2017-02-22 DIAGNOSIS — R042 Hemoptysis: Secondary | ICD-10-CM

## 2017-02-22 DIAGNOSIS — J449 Chronic obstructive pulmonary disease, unspecified: Secondary | ICD-10-CM

## 2017-02-22 DIAGNOSIS — R05 Cough: Secondary | ICD-10-CM | POA: Diagnosis not present

## 2017-02-22 MED ORDER — FLUTICASONE FUROATE-VILANTEROL 200-25 MCG/INH IN AEPB: 1.0000 | INHALATION_SPRAY | Freq: Every day | RESPIRATORY_TRACT | 0 refills | Status: DC

## 2017-02-22 NOTE — Patient Instructions (Signed)
   Use the inhaler [place]) we are giving you by doing 1 inhalation once daily. Call me if this doesn't help or your cough returns after you stop using the samples.  Remember to remove any dentures or partials you have before you use your inhaler. Remember to brush your teeth & tongue after you use your inhaler as well as rinse, gargle & spit to keep from getting thrush in your mouth or on your tongue (a white film).   I will see you back in 6 months or sooner if needed.

## 2017-03-23 ENCOUNTER — Ambulatory Visit (INDEPENDENT_AMBULATORY_CARE_PROVIDER_SITE_OTHER): Payer: Medicare HMO | Admitting: Family Medicine

## 2017-03-23 ENCOUNTER — Encounter: Payer: Self-pay | Admitting: Family Medicine

## 2017-03-23 ENCOUNTER — Ambulatory Visit: Payer: Medicare HMO | Admitting: Pulmonary Disease

## 2017-03-23 VITALS — BP 124/66 | HR 77 | Temp 97.0°F | Ht 73.0 in | Wt 196.2 lb

## 2017-03-23 DIAGNOSIS — G629 Polyneuropathy, unspecified: Secondary | ICD-10-CM

## 2017-03-23 MED ORDER — GABAPENTIN 300 MG PO CAPS: 300.0000 mg | ORAL_CAPSULE | Freq: Three times a day (TID) | ORAL | 3 refills | Status: DC

## 2017-03-23 NOTE — Progress Notes (Signed)
   HPI  Patient presents today here with tingling in bilateral hands.  Patient states that he's got tingling in his bilateral hands in his fourth and fifth digits. He does have recently uncontrolled diabetes. He denies any symptoms in his first through third fingers or feet. He denies any injury or neck pain.  He denies any numbness or weakness.    PMH: Smoking status noted ROS: Per HPI  Objective: BP 124/66   Pulse 77   Temp (!) 97 F (36.1 C) (Oral)   Ht 6\' 1"  (1.854 m)   Wt 196 lb 3.2 oz (89 kg)   BMI 25.89 kg/m  Gen: NAD, alert, cooperative with exam HEENT: NCAT CV: RRR, good S1/S2, no murmur Resp: CTABL, no wheezes, non-labored Ext: No edema, warm Neuro: Alert and oriented, strength 5/5 and symmetric in bilateral grip, sensation grossly normal and the ulnar distribution compared to the median nerve distribution.  MSK No tenderness to palpation of the cervical spine or paraspinal muscles, full range of motion in neck   Assessment and plan:  # Neuropathy Bilateral hands in the ulnar distribution No suspicious of cervical radiculopathy, however he does not have any pain, he could also have an unusual development of diabetic neuropathy, however this is generally symmetric across all fingers. Trial of gabapentin Consider EMG if not improving, patient is a patient of [facility]   Meds ordered this encounter  Medications  . DISCONTD: gabapentin (NEURONTIN) 300 MG capsule    Sig: Take 1 capsule (300 mg total) by mouth 3 (three) times daily.    Dispense:  90 capsule    Refill:  3  . gabapentin (NEURONTIN) 300 MG capsule    Sig: Take 1 capsule (300 mg total) by mouth 3 (three) times daily.    Dispense:  90 capsule    Refill:  [place], MD [place] 03/23/2017, 9:24 AM

## 2017-03-23 NOTE — Patient Instructions (Signed)
Greta to see you!  Try gabapentin 1 pill at night for 3-4 days then twice a day, then 3 times a day.

## 2017-04-05 ENCOUNTER — Telehealth: Payer: Self-pay | Admitting: Pulmonary Disease

## 2017-04-05 MED ORDER — FLUTICASONE FUROATE-VILANTEROL 200-25 MCG/INH IN AEPB: 1.0000 | INHALATION_SPRAY | Freq: Every day | RESPIRATORY_TRACT | 3 refills | Status: DC

## 2017-04-05 NOTE — Telephone Encounter (Signed)
Pt requesting a 90 day supply of breo to be sent to mail order pharmacy.  This has been sent.  Nothing further needed

## 2017-04-19 ENCOUNTER — Telehealth: Payer: Self-pay | Admitting: Family Medicine

## 2017-04-19 MED ORDER — METFORMIN HCL 1000 MG PO TABS: 1000.0000 mg | ORAL_TABLET | Freq: Two times a day (BID) | ORAL | 0 refills | Status: DC

## 2017-04-19 NOTE — Telephone Encounter (Signed)
What is the name of the medication? metFORMIN (GLUCOPHAGE) 1000 MG tablet  Have you contacted your pharmacy to request a refill? No  Which pharmacy would you like this sent to? [facility] mail order   Patient notified that their request is being sent to the clinical staff for review and that they should receive a call once it is complete. If they do not receive a call within 24 hours they can check with their pharmacy or our office.

## 2017-04-19 NOTE — Telephone Encounter (Signed)
Left detailed message stating requested rx was sent to [facility] and to call back with any further questions or concerns.

## 2017-04-23 ENCOUNTER — Other Ambulatory Visit: Payer: Self-pay | Admitting: Family Medicine

## 2017-04-23 MED ORDER — METFORMIN HCL 1000 MG PO TABS: 1000.0000 mg | ORAL_TABLET | Freq: Two times a day (BID) | ORAL | 0 refills | Status: DC

## 2017-04-23 NOTE — Telephone Encounter (Signed)
LMOVM refill has been sent to [facility]

## 2017-04-25 DIAGNOSIS — Z85820 Personal history of malignant melanoma of skin: Secondary | ICD-10-CM | POA: Diagnosis not present

## 2017-04-25 DIAGNOSIS — L570 Actinic keratosis: Secondary | ICD-10-CM | POA: Diagnosis not present

## 2017-04-25 DIAGNOSIS — L820 Inflamed seborrheic keratosis: Secondary | ICD-10-CM | POA: Diagnosis not present

## 2017-04-25 DIAGNOSIS — Z85828 Personal history of other malignant neoplasm of skin: Secondary | ICD-10-CM | POA: Diagnosis not present

## 2017-05-04 ENCOUNTER — Telehealth: Payer: Self-pay | Admitting: Pulmonary Disease

## 2017-05-04 NOTE — Telephone Encounter (Signed)
The breo rx was sent to [facility] back on 10/4 and I have called the pt and requested that he call the [facility] to see if they need his consent to sent the rx out to him.  Will wait for pt to call back .

## 2017-05-08 NOTE — Telephone Encounter (Signed)
lmomtcb x 2 for the pt.  

## 2017-05-09 NOTE — Telephone Encounter (Signed)
LMTCB

## 2017-05-11 ENCOUNTER — Telehealth: Payer: Self-pay | Admitting: Pulmonary Disease

## 2017-05-11 MED ORDER — FLUTICASONE FUROATE-VILANTEROL 200-25 MCG/INH IN AEPB: 1.0000 | INHALATION_SPRAY | Freq: Every day | RESPIRATORY_TRACT | 3 refills | Status: DC

## 2017-05-11 NOTE — Telephone Encounter (Signed)
Spoke with pt and advised rx sent to pharmacy. Nothing further is needed.   

## 2017-05-11 NOTE — Telephone Encounter (Signed)
I have called the pt to see if this issue was resolved.  Advised the pt if he needed further assistance to give [place] a call back.

## 2017-05-14 ENCOUNTER — Ambulatory Visit: Payer: Medicare HMO | Admitting: Family Medicine

## 2017-05-14 ENCOUNTER — Encounter: Payer: Self-pay | Admitting: Family Medicine

## 2017-05-14 ENCOUNTER — Ambulatory Visit (INDEPENDENT_AMBULATORY_CARE_PROVIDER_SITE_OTHER): Payer: Medicare HMO

## 2017-05-14 VITALS — BP 143/74 | HR 103 | Temp 97.5°F | Ht 73.0 in | Wt 201.6 lb

## 2017-05-14 DIAGNOSIS — M79671 Pain in right foot: Secondary | ICD-10-CM

## 2017-05-14 DIAGNOSIS — M19071 Primary osteoarthritis, right ankle and foot: Secondary | ICD-10-CM | POA: Diagnosis not present

## 2017-05-14 DIAGNOSIS — R109 Unspecified abdominal pain: Secondary | ICD-10-CM

## 2017-05-14 DIAGNOSIS — E118 Type 2 diabetes mellitus with unspecified complications: Secondary | ICD-10-CM

## 2017-05-14 DIAGNOSIS — M79642 Pain in left hand: Secondary | ICD-10-CM

## 2017-05-14 NOTE — Patient Instructions (Signed)
Great to see you!  Come back in 1 month with a blood sugar log.

## 2017-05-14 NOTE — Progress Notes (Signed)
   HPI  Patient presents today follow-up for chronic medical conditions  80s Patient does not remember being called and told about his A1c.  Has not been checking his blood sugar. Does note one episode recently of polyuria, likely hyperglycemia. Good medication compliance, no hypoglycemia.  Left hand pain Is been going on for a while, over the fourth finger on the palmar surface midway through the metacarpal is where the pain is. No injury.  Abdominal wall pain Short episodes of crampy abdominal pain with certain movements like reaching for seatbelt.  Patient states that happens under his right rib cage and under her left rib cage, he develops a "knot". It is short lifting goes away really quickly.  Right foot pain Has been seen by [facility] podiatry, they explained that it was arthritis, started on capsaicin cream which does help.  Hurts worse after walking long distance and does have some intermittent swelling.  Patient is concerned is a stress fracture  PMH: Smoking status noted ROS: Per HPI  Objective: BP (!) 143/74   Pulse (!) 103   Temp (!) 97.5 F (36.4 C) (Oral)   Ht 6\' 1"  (1.854 m)   Wt 201 lb 9.6 oz (91.4 kg)   BMI 26.60 kg/m  Gen: NAD, alert, cooperative with exam HEENT: NCAT CV: RRR, good S1/S2 Resp: CTABL, no wheezes, non-labored Abd: SNTND, BS present, no guarding or organomegaly diastases recti Ext: No edema, warm Neuro: Alert and oriented, No gross deficits MSK Left hand with thickening of the subcutaneous tissue over the more surface over the fourth metacarpal about midway through down the shaft.   Assessment and plan:  #2 diabetes Uncontrolled Discussed keeping a detailed log, follow-up 1 month  #Left hand pain Consistent with Dupuytren's contracture early Discussed  #Abdominal wall pain Likely musculoskeletal wall abdominal wall pain, no clear etiology or intervention that would be discretely beneficial. Reassurance provided, abdominal exam is  benign  #Right foot pain Likely arthritis, patient would like to rule out fracture, x-ray pending Reassurance provided again    Orders Placed This Encounter  Procedures  . DG Foot Complete Right    Standing Status:   Future    Number of Occurrences:   1    Standing Expiration Date:   07/14/2018    Order Specific Question:   Reason for Exam (SYMPTOM  OR DIAGNOSIS REQUIRED)    Answer:   R foot pain    Order Specific Question:   Preferred imaging location?    Answer:   Internal    Order Specific Question:   Radiology Contrast Protocol - do NOT remove file path    Answer:   [email or web address]    [name], MD [facility] 05/14/2017, 3:49 PM

## 2017-06-05 ENCOUNTER — Encounter: Payer: Self-pay | Admitting: Family Medicine

## 2017-06-05 ENCOUNTER — Ambulatory Visit: Payer: Medicare HMO | Admitting: Family Medicine

## 2017-06-05 VITALS — BP 143/85 | HR 96 | Temp 97.0°F | Ht 73.0 in | Wt 203.0 lb

## 2017-06-05 DIAGNOSIS — J441 Chronic obstructive pulmonary disease with (acute) exacerbation: Secondary | ICD-10-CM

## 2017-06-05 MED ORDER — AMOXICILLIN-POT CLAVULANATE 875-125 MG PO TABS: 1.0000 | ORAL_TABLET | Freq: Two times a day (BID) | ORAL | 0 refills | Status: DC

## 2017-06-05 NOTE — Patient Instructions (Signed)
Great to see you!  Come back or call if you are not getting better as expected.

## 2017-06-05 NOTE — Progress Notes (Signed)
   HPI  Patient presents today here with cough.  Patient explains over the last 3-4 days he has had increased cough, increased sputum, increased shortness of breath.  He still using albuterol as needed.  He is also using a controller inhaler sent from the [place].  He denies any chest pain.  He is tolerating food and fluids like usual.   PMH: Smoking status noted ROS: Per HPI  Objective: BP (!) 143/85   Pulse 96   Temp (!) 97 F (36.1 C) (Oral)   Ht 6\' 1"  (1.854 m)   Wt 203 lb (92.1 kg)   SpO2 97%   BMI 26.78 kg/m  Gen: NAD, alert, cooperative with exam HEENT: NCAT, oropharynx moist and clear, hearing aids in place bilaterally CV: RRR, good S1/S2, no murmur Resp: CTABL, no wheezes, non-labored Abd: SNTND, BS present, no guarding or organomegaly Ext: No edema, warm Neuro: Alert and oriented, No gross deficits  Assessment and plan:  #COPD exacerbation Given  diabetes and lack of wheezing I am avoiding prednisone for now. Treat with Augmentin Return to clinic or call back if worsening or not improving   Meds ordered this encounter  Medications  . amoxicillin-clavulanate (AUGMENTIN) 875-125 MG tablet    Sig: Take 1 tablet by mouth 2 (two) times daily.    Dispense:  20 tablet    Refill:  0    [name], MD [facility] 06/05/2017, 11:16 AM

## 2017-06-15 ENCOUNTER — Encounter: Payer: Self-pay | Admitting: Family Medicine

## 2017-06-15 ENCOUNTER — Ambulatory Visit: Payer: Medicare HMO | Admitting: Family Medicine

## 2017-06-15 VITALS — BP 173/95 | HR 86 | Temp 97.1°F | Ht 73.0 in | Wt 204.2 lb

## 2017-06-15 DIAGNOSIS — H66003 Acute suppurative otitis media without spontaneous rupture of ear drum, bilateral: Secondary | ICD-10-CM

## 2017-06-15 MED ORDER — AZITHROMYCIN 250 MG PO TABS: ORAL_TABLET | ORAL | 0 refills | Status: DC

## 2017-06-15 MED ORDER — FLUTICASONE PROPIONATE 50 MCG/ACT NA SUSP: 2.0000 | Freq: Every day | NASAL | 6 refills | Status: DC

## 2017-06-15 NOTE — Patient Instructions (Signed)
Great to see you!   

## 2017-06-15 NOTE — Progress Notes (Signed)
   HPI  Patient presents today here with sensation of fluid in the ear.  Patient explains that he has sensation of fullness in the right ear, he has an unusual sensation in the left ear described as his tympanic membrane contracting frequently. His hearing seems to be getting worse. He denies fever, chills, sweats, cough.  Recently took Augmentin, however only took 4 days of pills.  He denies any significant nasal congestion PMH: Smoking status noted ROS: Per HPI  Objective: BP (!) 173/95   Pulse 86   Temp (!) 97.1 F (36.2 C) (Oral)   Ht 6\' 1"  (1.854 m)   Wt 204 lb 3.2 oz (92.6 kg)   BMI 26.94 kg/m  Gen: NAD, alert, cooperative with exam HEENT: NCAT, nares clear, TMs bilaterally with erythema over the ossicles and effusion, landmarks are not visible CV: RRR, good S1/S2, no murmur Resp: CTABL, no wheezes, non-labored Ext: No edema, warm Neuro: Alert and oriented, No gross deficits  Assessment and plan:  #Bilateral acute suparative otitis media Treat with azithromycin with recent penicillin use. Symptoms are mild. Return to clinic with any concerns    Meds ordered this encounter  Medications  . azithromycin (ZITHROMAX) 250 MG tablet    Sig: Take 2 tablets on day 1 and 1 tablet daily after that    Dispense:  6 tablet    Refill:  0  . fluticasone (FLONASE) 50 MCG/ACT nasal spray    Sig: Place 2 sprays into both nostrils daily.    Dispense:  16 g    Refill:  [place], MD [facility] 06/15/2017, 7:04 PM

## 2017-06-17 NOTE — Progress Notes (Signed)
Cardiology Office Note   Date:  06/20/2017   ID:  Diem, Pagnotta Apr 09, 1941, MRN [id number]  PCP:  [name], MD  Cardiologist:   Minus Breeding, MD   Chief Complaint  Patient presents with  . Pre-op Exam      History of Present Illness: Timothy Guerrero is a 76 y.o. male who presents for follow-up of atrial flutter.  Atrial Fibrillation Clinic did not think ablation was indicated. In the past he's had nonobstructive disease and small vessel disease with high-grade stenosis and a nondominant right coronary artery on cath in 2013 in the [place]. He had a stress test in 2015 which was negative. He's been managed medically. He's had a well preserved ejection fraction.  He has had continued problems with HTN and has had adjustments to his meds. He has been anemic. He was on anticoagulation at that time and this was stopped. He actually required transfusion.   An echo in December 2017 demonstrated an EF of 25 - 30%.   Perfusion study demonstrated an EF of 35% with a fixed medium sized mid inferior perfusion defect.  He has remained off of anticoagulation because of his bleed.  He returns for follow up.    Since I saw him he has had no new cardiovascular complaints.  He continues to have some hemoptysis and is now following in the [place] clinic for this.  He has some chronic dyspnea but this is not new. The patient denies any new symptoms such as chest discomfort, neck or arm discomfort. There has been no new shortness of breath, PND or orthopnea. There have been no reported palpitations, presyncope or syncope.  He is active as he lives by himself and has to do his household chores.  He wants to have shoulder surgery.  Past Medical History:  Diagnosis Date  . Anemia   . Arthritis   . Atrial flutter ([place])    post op following lobectomy  . Benign essential tremor   . Blood transfusion without reported diagnosis   . Cataract   . CHF (congestive heart failure) ([place])   . Colon polyp    2007, polyp with early cancer, one year f/u no residual polyp. next TCS 2010, see PSH. [facility].  [name] COPD (chronic obstructive pulmonary disease) ([place])   . Diabetes mellitus without complication ([place])    10 years  . GERD (gastroesophageal reflux disease)   . HOH (hard of hearing)   . Hypercholesteremia   . Hypertension    10 years  . Lung cancer ([place])   . Prostate cancer ([place])   . Shortness of breath dyspnea    due to lung mass  . Skin melanoma ([place])   . Sleep apnea    wears CPAP sometimes, cannot tolerate all the time.    Past Surgical History:  Procedure Laterality Date  . bottom lobe of right lung removed  12/17/14  . CARDIAC CATHETERIZATION     no PCI  . CATARACT EXTRACTION Left   . CATARACT EXTRACTION W/PHACO Left 01/03/2016   Procedure: CATARACT EXTRACTION PHACO AND INTRAOCULAR LENS PLACEMENT LEFT EYE CDE=7.78;  Surgeon: Williams Che, MD;  Location: [facility];  Service: Ophthalmology;  Laterality: Left;  . CATARACT EXTRACTION W/PHACO Right 03/20/2016   Procedure: CATARACT EXTRACTION PHACO AND INTRAOCULAR LENS PLACEMENT; CDE:  9.58;  Surgeon: Williams Che, MD;  Location: [facility];  Service: Ophthalmology;  Laterality: Right;  . COLONOSCOPY  08/2008  Dr. Leafy Half, [place]: normal, internal grade 1 hemorrhoids. next TCS 08/2013  . COLONOSCOPY N/A 03/04/2014   Procedure: COLONOSCOPY;  Surgeon: Daneil Dolin, MD;  [facility];  Service: Endoscopy;  Laterality: N/A;  7:30am  . EYE SURGERY    . knee replacements     bilateral. over 10 years ago  . LOBECTOMY Right 12/17/2014   Procedure: RIGHT LOWER LOBECTOMY;  Surgeon: Melrose Nakayama, MD;  Location: [facility];  Service: Thoracic;  Laterality: Right;  . LYMPH NODE DISSECTION Right 12/17/2014   Procedure: LYMPH NODE DISSECTION;  Surgeon: Melrose Nakayama, MD;  Location: [place];  Service: Thoracic;  Laterality: Right;  . NECK SURGERY     melanoma removed.  [name] PROSTATE SURGERY       prostatectomy, radical  . TOTAL HIP ARTHROPLASTY  2007   left  . total shoulder replacement  2011   left  . VIDEO ASSISTED THORACOSCOPY (VATS)/WEDGE RESECTION Right 12/17/2014   Procedure: RIGHT VIDEO ASSISTED THORACOSCOPY (VATS);  Surgeon: Melrose Nakayama, MD;  Location: [facility];  Service: Thoracic;  Laterality: Right;  [name] VIDEO BRONCHOSCOPY Bilateral 11/17/2016   Procedure: VIDEO BRONCHOSCOPY WITHOUT FLUORO;  Surgeon: Javier Glazier, MD;  [facility];  Service: Cardiopulmonary;  Laterality: Bilateral;     Current Outpatient Medications  Medication Sig Dispense Refill  . amLODipine (NORVASC) 10 MG tablet Take 10 mg by mouth daily. ((blood pressure))    . fluticasone (FLONASE) 50 MCG/ACT nasal spray Place 2 sprays into both nostrils daily. 16 g 6  . fluticasone furoate-vilanterol (BREO ELLIPTA) 200-25 MCG/INH AEPB Inhale 1 puff daily into the lungs. 3 each 3  . furosemide (LASIX) 20 MG tablet Take 1 tablet (20 mg total) by mouth daily. 90 tablet 3  . gabapentin (NEURONTIN) 300 MG capsule Take 1 capsule (300 mg total) by mouth 3 (three) times daily. 90 capsule 3  . glipiZIDE (GLUCOTROL) 10 MG tablet Take 1 tablet (10 mg total) by mouth daily before breakfast. ((for blood sugar)) 90 tablet 3  . Insulin Glargine (LANTUS SOLOSTAR) 100 UNIT/ML Solostar Pen Inject 20-40 Units into the skin daily at 10 pm. (Patient taking differently: Inject 30 Units into the skin daily. ) 5 pen PRN  . irbesartan (AVAPRO) 300 MG tablet Take 1 tablet (300 mg total) by mouth daily. 90 tablet 3  . levothyroxine (SYNTHROID, LEVOTHROID) 50 MCG tablet Take 1 tablet (50 mcg total) by mouth daily before breakfast. ((for thyroid)) 90 tablet 3  . metFORMIN (GLUCOPHAGE) 1000 MG tablet Take 1 tablet (1,000 mg total) by mouth 2 (two) times daily with a meal. 180 tablet 0  . metoprolol (TOPROL-XL) 200 MG 24 hr tablet Take 200 mg by mouth daily.    . Multiple Vitamins-Minerals (CENTRUM SILVER 50+MEN) TABS Take 1  tablet by mouth daily.    . pantoprazole (PROTONIX) 40 MG tablet Take 1 tablet (40 mg total) by mouth daily. 90 tablet 1  . valACYclovir (VALTREX) 1000 MG tablet Take 1,000 mg by mouth as needed (cold sores).     . potassium chloride (K-DUR) 10 MEQ tablet Take 1 tablet (10 mEq total) by mouth daily. Take only on days when taking lasix 90 tablet 3   No current facility-administered medications for this visit.     Allergies:   Patient has no known allergies.    ROS:  Please see the history of present illness.   Otherwise, review of systems are positive for none.  All other systems are reviewed and negative.    PHYSICAL EXAM: VS:  BP (!) 149/75   Pulse 100   Ht 6\' 1"  (1.854 m)   Wt 205 lb (93 kg)   BMI 27.05 kg/m  , BMI Body mass index is 27.05 kg/m.  GENERAL:  Well appearing NECK:  No jugular venous distention, waveform within normal limits, carotid upstroke brisk and symmetric, no bruits, no thyromegaly LUNGS:  Clear to auscultation bilaterally CHEST:  Unremarkable HEART:  PMI not displaced or sustained,S1 and S2 within normal limits, no S3, no S4, no clicks, no rubs, no murmurs ABD:  Flat, positive bowel sounds normal in frequency in pitch, no bruits, no rebound, no guarding, no midline pulsatile mass, no hepatomegaly, no splenomegaly EXT:  2 plus pulses throughout, no edema, no cyanosis no clubbing   EKG:  EKG is not done today.   Recent Labs: 11/07/2016: ALT 12; BUN 12; Creatinine, Ser 0.82; Hemoglobin 14.3; Platelets 251; Potassium 4.2; Sodium 143; TSH 2.690    Lipid Panel    Component Value Date/Time   CHOL 176 07/25/2016 1006   TRIG 125 07/25/2016 1006   HDL 42 07/25/2016 1006   CHOLHDL 4.2 07/25/2016 1006   LDLCALC 109 (H) 07/25/2016 1006      Wt Readings from Last 3 Encounters:  06/20/17 205 lb (93 kg)  06/15/17 204 lb 3.2 oz (92.6 kg)  06/05/17 203 lb (92.1 kg)      Other studies Reviewed: Additional studies/ records that were reviewed today include:   Labs Review of the above records demonstrates:    .     ASSESSMENT AND PLAN:  ATRIAL FLUTTER:  I think it is very likely that he has paroxysmal flutter.  [name] has a CHA2DS2 - VASc score of 4 with a risk of stroke of 4%.   He is maintained sinus rhythm.  He has had some anemia which is probably been related to GI blood loss.  He continues to have hemoptysis.  For these reasons think he is high risk for anticoagulation I choose not to start this unless he has documented recurrent flutter fibrillation.   HTN:   His blood pressure is better controlled.  He is in keep a blood pressure diary.  I adjust his his medications previously.  No change in therapy is planned at this point.   CAD:   He has no active symptoms.  He needs aggressive risk reduction.  No change in therapy is planned.  CHRONIC SYSTOLIC HF:   He is euvolemic.  Follow-up visit I will consider switching to [facility] but cost is a consideration.   PREOP: The patient is at moderate risk for cardiovascular complications.  However, he had no active ischemia on stress perfusion last year.  He has a high functional level.  He has no acute symptoms.  No further cardiovascular testing is suggested.  He needs aggressive optimal medical therapy as applied and he understands the risks benefits of any elective procedure.  DM: His A1c has gone up to 9.4 and it was much better.  He and I had a long discussion about this and he will follow more closely with [name], MD as he understands the importance of better blood sugar control.  Current medicines are reviewed at length with the patient today.  The patient does have concerns regarding medicines.  The following changes have been made:  None  Labs/ tests ordered today include:   None  No orders of  the defined types were placed in this encounter.    Disposition:   FU with me in six months.     Signed, Minus Breeding, MD  06/20/2017 10:00 AM    [place]

## 2017-06-20 ENCOUNTER — Ambulatory Visit (INDEPENDENT_AMBULATORY_CARE_PROVIDER_SITE_OTHER): Payer: Medicare HMO | Admitting: Cardiology

## 2017-06-20 ENCOUNTER — Encounter: Payer: Self-pay | Admitting: Cardiology

## 2017-06-20 VITALS — BP 149/75 | HR 100 | Ht 73.0 in | Wt 205.0 lb

## 2017-06-20 DIAGNOSIS — I255 Ischemic cardiomyopathy: Secondary | ICD-10-CM | POA: Diagnosis not present

## 2017-06-20 DIAGNOSIS — I4892 Unspecified atrial flutter: Secondary | ICD-10-CM

## 2017-06-20 DIAGNOSIS — I5022 Chronic systolic (congestive) heart failure: Secondary | ICD-10-CM | POA: Diagnosis not present

## 2017-06-20 DIAGNOSIS — E1159 Type 2 diabetes mellitus with other circulatory complications: Secondary | ICD-10-CM | POA: Diagnosis not present

## 2017-06-20 DIAGNOSIS — I10 Essential (primary) hypertension: Secondary | ICD-10-CM | POA: Diagnosis not present

## 2017-06-20 NOTE — Patient Instructions (Signed)
Medication Instructions:  The current medical regimen is effective;  continue present plan and medications.  Follow-Up: Follow up in 6 months with Dr. Percival Spanish in [place].  You will receive a letter in the mail 2 months before you are due.  Please call us when you receive this letter to schedule your follow up appointment.  If you need a refill on your cardiac medications before your next appointment, please call your pharmacy.  Thank you for choosing [facility]!!

## 2017-06-30 ENCOUNTER — Telehealth: Payer: Self-pay | Admitting: Family Medicine

## 2017-06-30 NOTE — Telephone Encounter (Signed)
What symptoms do you have? BP is 219/111, pt can not get it to go down  How long have you been sick? Since yesterday  Have you been seen for this problem? no  If your provider decides to give you a prescription, which pharmacy would you like for it to be sent to? [facility]   Patient informed that this information will be sent to the clinical staff for review and that they should receive a follow up call.

## 2017-06-30 NOTE — Telephone Encounter (Signed)
PT retook BP and was 165/81 pt was offered an apt with acute provider for next week and pt denied states he will call back next week. PT was made aware if BP continues to go up to go to ER. PT is going to keep record of his BPs to bring in

## 2017-07-05 ENCOUNTER — Ambulatory Visit: Payer: Medicare HMO | Admitting: Family Medicine

## 2017-07-12 DIAGNOSIS — M19019 Primary osteoarthritis, unspecified shoulder: Secondary | ICD-10-CM | POA: Insufficient documentation

## 2017-07-16 DIAGNOSIS — M19011 Primary osteoarthritis, right shoulder: Secondary | ICD-10-CM | POA: Diagnosis not present

## 2017-07-17 ENCOUNTER — Ambulatory Visit (INDEPENDENT_AMBULATORY_CARE_PROVIDER_SITE_OTHER): Payer: Medicare HMO | Admitting: Family Medicine

## 2017-07-17 ENCOUNTER — Encounter: Payer: Self-pay | Admitting: Family Medicine

## 2017-07-17 VITALS — BP 147/82 | HR 99 | Temp 97.5°F | Ht 73.0 in | Wt 205.0 lb

## 2017-07-17 DIAGNOSIS — I10 Essential (primary) hypertension: Secondary | ICD-10-CM | POA: Diagnosis not present

## 2017-07-17 DIAGNOSIS — E1159 Type 2 diabetes mellitus with other circulatory complications: Secondary | ICD-10-CM

## 2017-07-17 DIAGNOSIS — E039 Hypothyroidism, unspecified: Secondary | ICD-10-CM

## 2017-07-17 MED ORDER — CLONIDINE HCL 0.3 MG/24HR TD PTWK: 0.3000 mg | MEDICATED_PATCH | TRANSDERMAL | 1 refills | Status: DC

## 2017-07-17 MED ORDER — HYDRALAZINE HCL 25 MG PO TABS: 25.0000 mg | ORAL_TABLET | Freq: Three times a day (TID) | ORAL | 1 refills | Status: DC

## 2017-07-17 NOTE — Progress Notes (Signed)
   HPI  Patient presents today follow-up for hypertension.  Patient states that his blood pressures been fluctuating widely. At times he measures it as high as 180, or over 111 systolic at home. No headaches or chest pain. Pulses generally in the 90s.  She does not feel overly anxious.  He is not convinced that this could be anxiety related. He reports very good medication compliance. No headache or chest pain   Patient reports that he can walk up a flight of stairs without chest pain or shortness of breath.  PMH: Smoking status noted ROS: Per HPI  Objective: BP (!) 147/82   Pulse 99   Temp (!) 97.5 F (36.4 C) (Oral)   Ht 6\' 1"  (1.854 m)   Wt 205 lb (93 kg)   BMI 27.05 kg/m  Gen: NAD, alert, cooperative with exam HEENT: NCAT CV: RRR Resp: CTABL, no wheezes, non-labored Ext: No edema, warm Neuro: Alert and oriented, No gross deficits   Assessment and plan:  #Hypertension Elevated today, patient reports higher elevations at other specialty visits as well as times at home. Starting hydralazine 25 mg 3 times daily, my initial choice was clonidine patch, however this was not well covered by his insurance.  Type 2 diabetes-patient to return for labs.   Meds ordered this encounter  Medications  . DISCONTD: cloNIDine (CATAPRES - DOSED IN MG/24 HR) 0.3 mg/24hr patch    Sig: Place 1 patch (0.3 mg total) onto the skin once a week.    Dispense:  4 patch    Refill:  1  . hydrALAZINE (APRESOLINE) 25 MG tablet    Sig: Take 1 tablet (25 mg total) by mouth 3 (three) times daily.    Dispense:  90 tablet    Refill:  Churchill, MD [facility] 07/17/2017, 12:04 PM

## 2017-07-17 NOTE — Patient Instructions (Signed)
Great to see you!  Start clonidine patch 1 patch per week.   Come back in about 2-3 weeks

## 2017-07-18 DIAGNOSIS — E039 Hypothyroidism, unspecified: Secondary | ICD-10-CM | POA: Diagnosis not present

## 2017-07-18 DIAGNOSIS — I10 Essential (primary) hypertension: Secondary | ICD-10-CM | POA: Diagnosis not present

## 2017-07-18 DIAGNOSIS — E1159 Type 2 diabetes mellitus with other circulatory complications: Secondary | ICD-10-CM | POA: Diagnosis not present

## 2017-07-18 LAB — BAYER DCA HB A1C WAIVED: HB A1C (BAYER DCA - WAIVED): 10.4 % — ABNORMAL HIGH (ref <7.0)

## 2017-07-19 LAB — CMP14+EGFR
ALT: 8 IU/L (ref 0–44)
AST: 11 IU/L (ref 0–40)
Albumin/Globulin Ratio: 1.8 (ref 1.2–2.2)
Albumin: 4.1 g/dL (ref 3.5–4.8)
Alkaline Phosphatase: 82 IU/L (ref 39–117)
BUN/Creatinine Ratio: 12 (ref 10–24)
BUN: 13 mg/dL (ref 8–27)
Bilirubin Total: 0.3 mg/dL (ref 0.0–1.2)
CO2: 25 mmol/L (ref 20–29)
Calcium: 9.4 mg/dL (ref 8.6–10.2)
Chloride: 103 mmol/L (ref 96–106)
Creatinine, Ser: 1.13 mg/dL (ref 0.76–1.27)
GFR calc Af Amer: 73 mL/min/{1.73_m2} (ref >59)
GFR calc non Af Amer: 63 mL/min/{1.73_m2} (ref >59)
Globulin, Total: 2.3 g/dL (ref 1.5–4.5)
Glucose: 327 mg/dL — ABNORMAL HIGH (ref 65–99)
Potassium: 4.3 mmol/L (ref 3.5–5.2)
Sodium: 143 mmol/L (ref 134–144)
Total Protein: 6.4 g/dL (ref 6.0–8.5)

## 2017-07-19 LAB — CBC WITH DIFFERENTIAL/PLATELET
Basophils Absolute: 0 10*3/uL (ref 0.0–0.2)
Basos: 0 %
EOS (ABSOLUTE): 0.2 10*3/uL (ref 0.0–0.4)
Eos: 2 %
Hematocrit: 43.6 % (ref 37.5–51.0)
Hemoglobin: 13.7 g/dL (ref 13.0–17.7)
Immature Grans (Abs): 0 10*3/uL (ref 0.0–0.1)
Immature Granulocytes: 0 %
Lymphocytes Absolute: 1.7 10*3/uL (ref 0.7–3.1)
Lymphs: 25 %
MCH: 29.3 pg (ref 26.6–33.0)
MCHC: 31.4 g/dL — ABNORMAL LOW (ref 31.5–35.7)
MCV: 93 fL (ref 79–97)
Monocytes Absolute: 0.4 10*3/uL (ref 0.1–0.9)
Monocytes: 5 %
Neutrophils Absolute: 4.4 10*3/uL (ref 1.4–7.0)
Neutrophils: 68 %
Platelets: 276 10*3/uL (ref 150–379)
RBC: 4.67 x10E6/uL (ref 4.14–5.80)
RDW: 14.4 % (ref 12.3–15.4)
WBC: 6.6 10*3/uL (ref 3.4–10.8)

## 2017-07-19 LAB — TSH: TSH: 2.04 u[IU]/mL (ref 0.450–4.500)

## 2017-07-19 LAB — LIPID PANEL
Chol/HDL Ratio: 4.3 ratio (ref 0.0–5.0)
Cholesterol, Total: 155 mg/dL (ref 100–199)
HDL: 36 mg/dL — ABNORMAL LOW (ref >39)
LDL Calculated: 82 mg/dL (ref 0–99)
Triglycerides: 186 mg/dL — ABNORMAL HIGH (ref 0–149)
VLDL Cholesterol Cal: 37 mg/dL (ref 5–40)

## 2017-07-24 ENCOUNTER — Telehealth: Payer: Self-pay | Admitting: Family Medicine

## 2017-07-24 NOTE — Telephone Encounter (Signed)
lmtcb

## 2017-07-24 NOTE — Telephone Encounter (Signed)
Patient states that his heart rate has been elevated this morning.   10am- p-153, bp-147/99 11am- p-122, bp-90/57 12p- p-115 bp-152/66  (taken while we were on the phone)  Patient states that while his heart rate was elevated this morning he was having slight discomfort "in his left lung". States that has been on going but states now he feels fine.  Please review and advise

## 2017-07-24 NOTE — Telephone Encounter (Signed)
Refer to previous phone note 

## 2017-07-24 NOTE — Telephone Encounter (Signed)
Called patient  Pt has not been taking amlodipine  HR is 113 currently.   Pt was having L sided chest discomfort..  Patient feels well. He has been taking hydralazine 3 times a day and blood pressures in the 130s average.  Patient will switch to amlodipine tonight and stop hydralazine.  I will see if we can get him worked into the atrial fibrillation clinic later today.  [name], MD [facility] 07/24/2017, 12:21 PM

## 2017-07-24 NOTE — Telephone Encounter (Signed)
Spoke with Stacy at the atrial Fib clinic and they can see him tomorrow at 9:30am. Called patient and he is agreeable to see them tomorrow at 9:30 in [place] at the [facility].

## 2017-07-25 ENCOUNTER — Ambulatory Visit (HOSPITAL_COMMUNITY)
Admission: RE | Admit: 2017-07-25 | Discharge: 2017-07-25 | Disposition: A | Discharge status: Home / Self Care | Payer: Medicare HMO | Source: Ambulatory Visit | Attending: Nurse Practitioner | Admitting: Nurse Practitioner

## 2017-07-25 ENCOUNTER — Ambulatory Visit: Payer: Self-pay | Admitting: *Deleted

## 2017-07-25 ENCOUNTER — Ambulatory Visit: Payer: Medicare HMO | Admitting: Family Medicine

## 2017-07-25 ENCOUNTER — Encounter (HOSPITAL_COMMUNITY): Payer: Self-pay | Admitting: Nurse Practitioner

## 2017-07-25 VITALS — BP 198/102 | HR 96 | Ht 73.0 in | Wt 204.4 lb

## 2017-07-25 DIAGNOSIS — Z794 Long term (current) use of insulin: Secondary | ICD-10-CM | POA: Insufficient documentation

## 2017-07-25 DIAGNOSIS — Z8546 Personal history of malignant neoplasm of prostate: Secondary | ICD-10-CM | POA: Diagnosis not present

## 2017-07-25 DIAGNOSIS — E119 Type 2 diabetes mellitus without complications: Secondary | ICD-10-CM | POA: Diagnosis not present

## 2017-07-25 DIAGNOSIS — Z9889 Other specified postprocedural states: Secondary | ICD-10-CM | POA: Insufficient documentation

## 2017-07-25 DIAGNOSIS — I483 Typical atrial flutter: Secondary | ICD-10-CM | POA: Diagnosis not present

## 2017-07-25 DIAGNOSIS — I4892 Unspecified atrial flutter: Secondary | ICD-10-CM | POA: Diagnosis not present

## 2017-07-25 DIAGNOSIS — Z79899 Other long term (current) drug therapy: Secondary | ICD-10-CM | POA: Diagnosis not present

## 2017-07-25 DIAGNOSIS — K219 Gastro-esophageal reflux disease without esophagitis: Secondary | ICD-10-CM | POA: Insufficient documentation

## 2017-07-25 DIAGNOSIS — Z85820 Personal history of malignant melanoma of skin: Secondary | ICD-10-CM | POA: Insufficient documentation

## 2017-07-25 DIAGNOSIS — Z87891 Personal history of nicotine dependence: Secondary | ICD-10-CM | POA: Insufficient documentation

## 2017-07-25 DIAGNOSIS — J449 Chronic obstructive pulmonary disease, unspecified: Secondary | ICD-10-CM | POA: Diagnosis not present

## 2017-07-25 DIAGNOSIS — I110 Hypertensive heart disease with heart failure: Secondary | ICD-10-CM | POA: Diagnosis not present

## 2017-07-25 DIAGNOSIS — Z85118 Personal history of other malignant neoplasm of bronchus and lung: Secondary | ICD-10-CM | POA: Insufficient documentation

## 2017-07-25 DIAGNOSIS — I509 Heart failure, unspecified: Secondary | ICD-10-CM | POA: Diagnosis not present

## 2017-07-25 MED ORDER — DILTIAZEM HCL 30 MG PO TABS: ORAL_TABLET | ORAL | 1 refills | Status: DC

## 2017-07-25 NOTE — Progress Notes (Signed)
Primary Care Physician: [name], MD Referring Physician: Tristan Schroeder [facility]   Timothy Guerrero is a 77 y.o. male with a h/o COPD, CHF, HTN DM, Lung mass with lung resection 12/2014. After surgery, he was noted to have runs of atrial flutter. He was started on anticoagulation and amiodarone which was stopped after a very short time. He was seen f/u after surgery in afib clinic and was maintaining SR. He continued on anticoagulation. Became anemic, required transfusion 2/2 GI bleed 05/2016  and anticoagulation was stopped.Since lobectomy he has had intermittent hemoptysis and he was felt high risk of repeat bleeding so he has not been started back on anticoagulation. He has elevated BP and his BP medicine is in the middlle of being adjusted.  He is in the afib clinic as he called his PCP yesterday and reported a HR of 150, this was short lived and slowed down consistently until it went back to normal rhythm within  2 hours. He says he may have noted 1-2 minutes of flutters very infrequently since his surgery and this is the first episode that has lasted this long since lung surgery. He is in SR today.   Today, he denies symptoms of palpitations, chest pain, shortness of breath, orthopnea, PND, lower extremity edema, dizziness, presyncope, syncope, or neurologic sequela. The patient is tolerating medications without difficulties and is otherwise without complaint today.   Past Medical History:  Diagnosis Date  . Anemia   . Arthritis   . Atrial flutter ([place])    post op following lobectomy  . Benign essential tremor   . Blood transfusion without reported diagnosis   . Cataract   . CHF (congestive heart failure) ([place])   . Colon polyp    2007, polyp with early cancer, one year f/u no residual polyp. next TCS 2010, see PSH. [facility].  [name] COPD (chronic obstructive pulmonary disease) ([place])   . Diabetes mellitus without complication ([place])    10 years  . GERD  (gastroesophageal reflux disease)   . HOH (hard of hearing)   . Hypercholesteremia   . Hypertension    10 years  . Lung cancer ([place])   . Prostate cancer ([place])   . Shortness of breath dyspnea    due to lung mass  . Skin melanoma ([place])   . Sleep apnea    wears CPAP sometimes, cannot tolerate all the time.   Past Surgical History:  Procedure Laterality Date  . bottom lobe of right lung removed  12/17/14  . CARDIAC CATHETERIZATION     no PCI  . CATARACT EXTRACTION Left   . CATARACT EXTRACTION W/PHACO Left 01/03/2016   Procedure: CATARACT EXTRACTION PHACO AND INTRAOCULAR LENS PLACEMENT LEFT EYE CDE=7.78;  Surgeon: Williams Che, MD;  Location: [facility];  Service: Ophthalmology;  Laterality: Left;  . CATARACT EXTRACTION W/PHACO Right 03/20/2016   Procedure: CATARACT EXTRACTION PHACO AND INTRAOCULAR LENS PLACEMENT; CDE:  9.58;  Surgeon: Williams Che, MD;  Location: [facility];  Service: Ophthalmology;  Laterality: Right;  . COLONOSCOPY  08/2008   Dr. Leafy Half, [place]: normal, internal grade 1 hemorrhoids. next TCS 08/2013  . COLONOSCOPY N/A 03/04/2014   Procedure: COLONOSCOPY;  Surgeon: Daneil Dolin, MD;  [facility];  Service: Endoscopy;  Laterality: N/A;  7:30am  . EYE SURGERY    . knee replacements     bilateral. over 10 years ago  . LOBECTOMY Right 12/17/2014  Procedure: RIGHT LOWER LOBECTOMY;  Surgeon: Melrose Nakayama, MD;  Location: [facility];  Service: Thoracic;  Laterality: Right;  . LYMPH NODE DISSECTION Right 12/17/2014   Procedure: LYMPH NODE DISSECTION;  Surgeon: Melrose Nakayama, MD;  Location: [facility];  Service: Thoracic;  Laterality: Right;  . NECK SURGERY     melanoma removed.  [name] PROSTATE SURGERY     prostatectomy, radical  . TOTAL HIP ARTHROPLASTY  2007   left  . total shoulder replacement  2011   left  . VIDEO ASSISTED THORACOSCOPY (VATS)/WEDGE RESECTION Right 12/17/2014   Procedure: RIGHT VIDEO ASSISTED THORACOSCOPY (VATS);   Surgeon: Melrose Nakayama, MD;  Location: [facility];  Service: Thoracic;  Laterality: Right;  [name] VIDEO BRONCHOSCOPY Bilateral 11/17/2016   Procedure: VIDEO BRONCHOSCOPY WITHOUT FLUORO;  Surgeon: Javier Glazier, MD;  [facility];  Service: Cardiopulmonary;  Laterality: Bilateral;    Current Outpatient Medications  Medication Sig Dispense Refill  . amLODipine (NORVASC) 10 MG tablet Take 10 mg by mouth daily. ((blood pressure))    . fluticasone (FLONASE) 50 MCG/ACT nasal spray Place 2 sprays into both nostrils daily. 16 g 6  . fluticasone furoate-vilanterol (BREO ELLIPTA) 200-25 MCG/INH AEPB Inhale 1 puff daily into the lungs. 3 each 3  . furosemide (LASIX) 20 MG tablet Take 1 tablet (20 mg total) by mouth daily. 90 tablet 3  . gabapentin (NEURONTIN) 300 MG capsule Take 1 capsule (300 mg total) by mouth 3 (three) times daily. 90 capsule 3  . glipiZIDE (GLUCOTROL) 10 MG tablet Take 1 tablet (10 mg total) by mouth daily before breakfast. ((for blood sugar)) 90 tablet 3  . Insulin Glargine (LANTUS SOLOSTAR) 100 UNIT/ML Solostar Pen Inject 20-40 Units into the skin daily at 10 pm. (Patient taking differently: Inject 30 Units into the skin daily. ) 5 pen PRN  . irbesartan (AVAPRO) 300 MG tablet Take 1 tablet (300 mg total) by mouth daily. 90 tablet 3  . levothyroxine (SYNTHROID, LEVOTHROID) 50 MCG tablet Take 1 tablet (50 mcg total) by mouth daily before breakfast. ((for thyroid)) 90 tablet 3  . metFORMIN (GLUCOPHAGE) 1000 MG tablet Take 1 tablet (1,000 mg total) by mouth 2 (two) times daily with a meal. 180 tablet 0  . metoprolol (TOPROL-XL) 200 MG 24 hr tablet Take 200 mg by mouth daily.    . Multiple Vitamins-Minerals (CENTRUM SILVER 50+MEN) TABS Take 1 tablet by mouth daily.    . pantoprazole (PROTONIX) 40 MG tablet Take 1 tablet (40 mg total) by mouth daily. 90 tablet 1  . potassium chloride (K-DUR) 10 MEQ tablet Take 1 tablet (10 mEq total) by mouth daily. Take only on days when taking  lasix 90 tablet 3  . valACYclovir (VALTREX) 1000 MG tablet Take 1,000 mg by mouth as needed (cold sores).     [name] diltiazem (CARDIZEM) 30 MG tablet Take 1 tablet every 4 hours AS NEEDED for AFIB heart rate >100 45 tablet 1   No current facility-administered medications for this encounter.     No Known Allergies  Social History   Socioeconomic History  . Marital status: Divorced    Spouse name: Not on file  . Number of children: 3  . Years of education: Not on file  . Highest education level: Not on file  Social Needs  . Financial resource strain: Not on file  . Food insecurity - worry: Not on file  . Food insecurity - inability: Not on file  . Transportation needs - medical: Not  on file  . Transportation needs - non-medical: Not on file  Occupational History  . Occupation: retired  Tobacco Use  . Smoking status: Former Smoker    Packs/day: 2.00    Years: 32.00    Pack years: 64.00    Types: Cigarettes    Last attempt to quit: 07/03/1986    Years since quitting: 31.0  . Smokeless tobacco: Never Used  Substance and Sexual Activity  . Alcohol use: No    Alcohol/week: 0.0 oz    Comment: rarely wine  . Drug use: No  . Sexual activity: Yes  Other Topics Concern  . Not on file  Social History Narrative   [employer].  Lives alone.      [facility]:   Originally from [facility]. Served in [employer] with significant exposure to asbestos. 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 He has also lived in [place], [place], & [place]. He has lived in [place] since 1979. 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 No pets currently. Remote bird exposure.     Family History  Problem Relation Age of Onset  . Cancer Mother        unknown type  . COPD Sister   . Breast cancer Sister   . Cancer Sister 59       breast  . Prostate cancer Son        died age 59  . Heart attack Daughter   . Colon cancer Neg Hx     ROS- All systems are reviewed and negative except as per the HPI above  Physical Exam: Vitals:    07/25/17 0930  BP: (!) 198/102  Pulse: 96  Weight: 204 lb 6.4 oz (92.7 kg)  Height: 6\' 1"  (1.854 m)   Wt Readings from Last 3 Encounters:  07/25/17 204 lb 6.4 oz (92.7 kg)  07/17/17 205 lb (93 kg)  06/20/17 205 lb (93 kg)    Labs: Lab Results  Component Value Date   NA 143 07/18/2017   K 4.3 07/18/2017   CL 103 07/18/2017   CO2 25 07/18/2017   GLUCOSE 327 (H) 07/18/2017   BUN 13 07/18/2017   CREATININE 1.13 07/18/2017   CALCIUM 9.4 07/18/2017   Lab Results  Component Value Date   INR 1.0 10/13/2016   Lab Results  Component Value Date   CHOL 155 07/18/2017   HDL 36 (L) 07/18/2017   LDLCALC 82 07/18/2017   TRIG 186 (H) 07/18/2017     GEN- The patient is well appearing, alert and oriented x 3 today.   Head- normocephalic, atraumatic Eyes-  Sclera clear, conjunctiva pink Ears- hearing intact Oropharynx- clear Neck- supple, no JVP Lymph- no cervical lymphadenopathy Lungs- Clear to ausculation bilaterally, normal work of breathing Heart- Regular rate and rhythm, no murmurs, rubs or gallops, PMI not laterally displaced GI- soft, NT, ND, + BS Extremities- no clubbing, cyanosis, or edema MS- no significant deformity or atrophy Skin- no rash or lesion Psych- euthymic mood, full affect Neuro- strength and sensation are intact  EKG-NSR at 66 bpm, pr int 158 ms, qrs int 76 ms, qtc 450 ms, normal EKG    Assessment and Plan: 1. Atrial flutter New onset at time of surgery 2016, brief in duration, monitor strips reviewed today from hospitilization and thought to be typical Very low burden since surgery with the episode yesterday the first to really get his attention Now resolved Will give 30 mg Cardizem to use as needed if HR is  racing over 100 bpm.  Will refer to Dr. Rayann Heman to see for further eval for ablation Off anticoagulation since 2017 2/2 GI bleed and ongoing intermittent hemoptysis, chadsvasc score of at least 5   2. HTN Poorly controlled BP meds currently  under adjustment Has f/u with PCP for further BP  Management  Altus Zaino C. Lanis Storlie, [facility] [address] [place], [place] [address] [phone]

## 2017-07-25 NOTE — Patient Instructions (Signed)
Your physician has recommended you make the following change in your medication:  1)Cardizem 30mg  -- take 1 tablet every 4 hours AS NEEDED for AFIB heart rate >100

## 2017-07-26 ENCOUNTER — Telehealth: Payer: Self-pay | Admitting: Family Medicine

## 2017-07-26 ENCOUNTER — Ambulatory Visit (INDEPENDENT_AMBULATORY_CARE_PROVIDER_SITE_OTHER): Payer: Medicare HMO | Admitting: Family Medicine

## 2017-07-26 ENCOUNTER — Encounter: Payer: Self-pay | Admitting: Family Medicine

## 2017-07-26 VITALS — BP 159/82 | HR 81 | Temp 97.1°F | Ht 73.0 in | Wt 203.4 lb

## 2017-07-26 DIAGNOSIS — I10 Essential (primary) hypertension: Secondary | ICD-10-CM | POA: Diagnosis not present

## 2017-07-26 DIAGNOSIS — E1159 Type 2 diabetes mellitus with other circulatory complications: Secondary | ICD-10-CM | POA: Diagnosis not present

## 2017-07-26 DIAGNOSIS — I4892 Unspecified atrial flutter: Secondary | ICD-10-CM | POA: Diagnosis not present

## 2017-07-26 MED ORDER — DAPAGLIFLOZIN PROPANEDIOL 10 MG PO TABS: 10.0000 mg | ORAL_TABLET | Freq: Every day | ORAL | 2 refills | Status: DC

## 2017-07-26 MED ORDER — METOPROLOL SUCCINATE ER 200 MG PO TB24: 200.0000 mg | ORAL_TABLET | Freq: Every day | ORAL | 3 refills | Status: DC

## 2017-07-26 NOTE — Progress Notes (Signed)
   HPI  Patient presents today here to follow-up for hypertension and diabetes.  Patient had atrial fibrillation with rapid ventricular rate which improved slightly at home, he was seen in the A. fib clinic and given diltiazem to take as needed for heart rate over 100. He understands this well and is comfortable with the plan.  Hypertension Patient states that after looking at his medications carefully at home he has not been taking amlodipine, he is also only been taking 50 mg of metoprolol. Is taking irbesartan as prescribed. No headache or chest pain. Patient states that on hydralazine plus metoprolol plus irbesartan his blood pressures were in the 130s and 40s.  I recommended discontinuing hydralazine and starting amlodipine instead, his blood pressures are now 150s and 60s, sometimes 180s at home.  Diabetes Patient states that he was treated with Trulicity and rapidly lost weight.  He lost so much weight and had such a low appetite but he did not want to take the medication and longer.  Since that time he has had a slow increase in his weight and his A1c. He reports using glipizide +30 units of Lantus plus metformin daily. Adding farxiga, his renal function appears good at baseline He is watching his diet intermittently, sometimes he skips lunch. He is comfortable with mealtime insulin if needed He is circumcised   PMH: Smoking status noted ROS: Per HPI  Objective: BP (!) 159/82   Pulse 81   Temp (!) 97.1 F (36.2 C) (Oral)   Ht 6\' 1"  (1.854 m)   Wt 203 lb 6.4 oz (92.3 kg)   BMI 26.84 kg/m  Gen: NAD, alert, cooperative with exam HEENT: NCAT CV: RRR, good S1/S2, no murmur Resp: CTABL, no wheezes, non-labored Ext: No edema, warm Neuro: Alert and oriented, No gross deficits  Assessment and plan:  #Type 2 diabetes Uncontrolled, A1c over 10 Patient checking blood sugars intermittently, I have given him a blood glucose log to check regularly today. Emphasized  postprandial plus fasting blood sugars only. No hypoglycemia His fasting blood sugars intermittently sound like they are 140-180. No adjustment in basal insulin Ideally I would like to stop glipizide, however I would like rapid improvement so that he can get ready for surgery.  #Hypertension Difficult to control blood pressure, patient has had some medication disparities Continue amlodipine plus irbesartan Increase metoprolol from 50 mg to 100 mg for 2-3 days, then increase to 200 mg as previously prescribed. Likely this will also help his rate control  #Atrial flutter Likely paroxysmal atrial flutter recent RVR Patient has diltiazem pill in the pocket to use if needed His metoprolol was at the incorrect dose, increase back up to previous dosing   Follow-up 7-10 days   [name], MD [facility] 07/26/2017, 11:54 AM

## 2017-07-26 NOTE — Patient Instructions (Signed)
Great to see you!  Start farxiga 1 pill once daily  keep track of your fasting glucose and 1 reading per day 2 hours after you eat.   Come back in about 1 week.

## 2017-07-27 ENCOUNTER — Telehealth: Payer: Self-pay | Admitting: Family Medicine

## 2017-07-27 MED ORDER — METOPROLOL SUCCINATE ER 200 MG PO TB24: 200.0000 mg | ORAL_TABLET | Freq: Every day | ORAL | 3 refills | Status: DC

## 2017-07-27 NOTE — Telephone Encounter (Signed)
lmtcb

## 2017-07-27 NOTE — Telephone Encounter (Signed)
Patient states that the metoprolol was supposed to get sent to [facility] - sent to correct pharmacy

## 2017-07-31 ENCOUNTER — Ambulatory Visit: Payer: Medicare HMO | Admitting: Family Medicine

## 2017-08-02 ENCOUNTER — Ambulatory Visit: Payer: Medicare HMO | Admitting: Family Medicine

## 2017-08-03 MED ORDER — PANTOPRAZOLE SODIUM 40 MG PO TBEC: 40.0000 mg | DELAYED_RELEASE_TABLET | Freq: Every day | ORAL | 1 refills | Status: DC

## 2017-08-03 NOTE — Telephone Encounter (Signed)
Patient wanting a refill on Protonix 40 sent to [facility]. Rx sent.

## 2017-08-03 NOTE — Telephone Encounter (Signed)
Pt asked to talk to [facility]

## 2017-08-06 ENCOUNTER — Encounter: Payer: Self-pay | Admitting: Family Medicine

## 2017-08-06 ENCOUNTER — Encounter: Payer: Self-pay | Admitting: Internal Medicine

## 2017-08-06 ENCOUNTER — Ambulatory Visit: Payer: Medicare HMO | Admitting: Internal Medicine

## 2017-08-06 VITALS — BP 126/62 | HR 84 | Ht 73.0 in | Wt 201.0 lb

## 2017-08-06 DIAGNOSIS — I483 Typical atrial flutter: Secondary | ICD-10-CM

## 2017-08-06 NOTE — Progress Notes (Signed)
Electrophysiology Office Note   Date:  08/06/2017   ID:  Louay, Myrie Sep 13, 1940, MRN [id number]  PCP:  [name], MD  Cardiologist:  Dr Percival Spanish Primary Electrophysiologist: Thompson Grayer, MD    CC: atrial flutter   History of Present Illness: Timothy Guerrero is a 77 y.o. male who presents today for electrophysiology evaluation.   He is referred by Dr Percival Spanish and Roderic Palau NP for EP consultation regarding atrial flutter.  He had post operative atrial flutter in 2016 (telemetry reviewed and confirmed personally by me today).  He has had issues with anemia and hemoptysis and is felt to be a poor candidate for long term anticoagulation.  He has done reasonably well.  His primary issue is with R shoulder pain at this time.  He has been evaluated by Dr Percival Spanish (06/20/17 note reviewed) and cleared for surgery. He is referred by AF clinic for consideration of atrial flutter ablation.  Today, he denies symptoms of palpitations, chest pain, shortness of breath, orthopnea, PND, lower extremity edema, claudication, dizziness, presyncope, syncope, bleeding, or neurologic sequela. The patient is tolerating medications without difficulties and is otherwise without complaint today.    Past Medical History:  Diagnosis Date  . Anemia   . Arthritis   . Atrial flutter ([place])    post op following lobectomy  . Benign essential tremor   . Blood transfusion without reported diagnosis   . Cataract   . CHF (congestive heart failure) ([place])   . Colon polyp    2007, polyp with early cancer, one year f/u no residual polyp. next TCS 2010, see PSH. [facility].  [name] COPD (chronic obstructive pulmonary disease) ([place])   . Diabetes mellitus without complication ([place])    10 years  . GERD (gastroesophageal reflux disease)   . HOH (hard of hearing)   . Hypercholesteremia   . Hypertension    10 years  . Lung cancer ([place])   . Prostate cancer ([place])   . Shortness of breath dyspnea     due to lung mass  . Skin melanoma ([place])   . Sleep apnea    wears CPAP sometimes, cannot tolerate all the time.   Past Surgical History:  Procedure Laterality Date  . bottom lobe of right lung removed  12/17/14  . CARDIAC CATHETERIZATION     no PCI  . CATARACT EXTRACTION Left   . CATARACT EXTRACTION W/PHACO Left 01/03/2016   Procedure: CATARACT EXTRACTION PHACO AND INTRAOCULAR LENS PLACEMENT LEFT EYE CDE=7.78;  Surgeon: Williams Che, MD;  Location: [facility];  Service: Ophthalmology;  Laterality: Left;  . CATARACT EXTRACTION W/PHACO Right 03/20/2016   Procedure: CATARACT EXTRACTION PHACO AND INTRAOCULAR LENS PLACEMENT; CDE:  9.58;  Surgeon: Williams Che, MD;  Location: [facility];  Service: Ophthalmology;  Laterality: Right;  . COLONOSCOPY  08/2008   Dr. Leafy Half, [place]: normal, internal grade 1 hemorrhoids. next TCS 08/2013  . COLONOSCOPY N/A 03/04/2014   Procedure: COLONOSCOPY;  Surgeon: Daneil Dolin, MD;  Location: [facility];  Service: Endoscopy;  Laterality: N/A;  7:30am  . EYE SURGERY    . knee replacements     bilateral. over 10 years ago  . LOBECTOMY Right 12/17/2014   Procedure: RIGHT LOWER LOBECTOMY;  Surgeon: Melrose Nakayama, MD;  Location: [facility];  Service: Thoracic;  Laterality: Right;  . LYMPH NODE DISSECTION Right 12/17/2014   Procedure: LYMPH NODE DISSECTION;  Surgeon: Revonda Standard  Roxan Hockey, MD;  Location: [facility];  Service: Thoracic;  Laterality: Right;  . NECK SURGERY     melanoma removed.  [name] PROSTATE SURGERY     prostatectomy, radical  . TOTAL HIP ARTHROPLASTY  2007   left  . total shoulder replacement  2011   left  . VIDEO ASSISTED THORACOSCOPY (VATS)/WEDGE RESECTION Right 12/17/2014   Procedure: RIGHT VIDEO ASSISTED THORACOSCOPY (VATS);  Surgeon: Melrose Nakayama, MD;  Location: [facility];  Service: Thoracic;  Laterality: Right;  [name] VIDEO BRONCHOSCOPY Bilateral 11/17/2016   Procedure: VIDEO BRONCHOSCOPY WITHOUT FLUORO;  Surgeon: Javier Glazier, MD;  Location: [facility];  Service: Cardiopulmonary;  Laterality: Bilateral;     Current Outpatient Medications  Medication Sig Dispense Refill  . amLODipine (NORVASC) 10 MG tablet Take 10 mg by mouth daily. ((blood pressure))    . dapagliflozin propanediol (FARXIGA) 10 MG TABS tablet Take 10 mg by mouth daily. 30 tablet 2  . diltiazem (CARDIZEM) 30 MG tablet Take 1 tablet every 4 hours AS NEEDED for AFIB heart rate >100 45 tablet 1  . fluticasone (FLONASE) 50 MCG/ACT nasal spray Place 2 sprays into both nostrils daily. 16 g 6  . fluticasone furoate-vilanterol (BREO ELLIPTA) 200-25 MCG/INH AEPB Inhale 1 puff daily into the lungs. 3 each 3  . furosemide (LASIX) 20 MG tablet Take 1 tablet (20 mg total) by mouth daily. 90 tablet 3  . gabapentin (NEURONTIN) 300 MG capsule Take 1 capsule (300 mg total) by mouth 3 (three) times daily. 90 capsule 3  . glipiZIDE (GLUCOTROL) 10 MG tablet Take 1 tablet (10 mg total) by mouth daily before breakfast. ((for blood sugar)) 90 tablet 3  . Insulin Glargine (LANTUS SOLOSTAR) 100 UNIT/ML Solostar Pen Inject 20-40 Units into the skin daily at 10 pm. 5 pen PRN  . irbesartan (AVAPRO) 300 MG tablet Take 1 tablet (300 mg total) by mouth daily. 90 tablet 3  . levothyroxine (SYNTHROID, LEVOTHROID) 50 MCG tablet Take 1 tablet (50 mcg total) by mouth daily before breakfast. ((for thyroid)) 90 tablet 3  . metFORMIN (GLUCOPHAGE) 1000 MG tablet Take 1 tablet (1,000 mg total) by mouth 2 (two) times daily with a meal. 180 tablet 0  . metoprolol (TOPROL-XL) 200 MG 24 hr tablet Take 1 tablet (200 mg total) by mouth daily. 90 tablet 3  . Multiple Vitamins-Minerals (CENTRUM SILVER 50+MEN) TABS Take 1 tablet by mouth daily.    . pantoprazole (PROTONIX) 40 MG tablet Take 1 tablet (40 mg total) by mouth daily. 90 tablet 1  . potassium chloride (K-DUR) 10 MEQ tablet Take 1 tablet (10 mEq total) by mouth daily. Take only on days when taking lasix 90 tablet 3  .  valACYclovir (VALTREX) 1000 MG tablet Take 1,000 mg by mouth as needed (cold sores).      No current facility-administered medications for this visit.     Allergies:   Patient has no allergy information on record.   Social History:  The patient  reports that he quit smoking about 31 years ago. His smoking use included cigarettes. He has a 64.00 pack-year smoking history. he has never used smokeless tobacco. He reports that he does not drink alcohol or use drugs.   Family History:  The patient's  family history includes Breast cancer in his sister; COPD in his sister; Cancer in his mother; Cancer (age of onset: 2) in his sister; Heart attack in his daughter; Prostate cancer in his son.    ROS:  Please see the  history of present illness.   All other systems are personally reviewed and negative.    PHYSICAL EXAM: VS:  BP 126/62   Pulse 84   Ht 6\' 1"  (1.854 m)   Wt 201 lb (91.2 kg)   BMI 26.52 kg/m  , BMI Body mass index is 26.52 kg/m. GEN: Well nourished, well developed, in no acute distress  HEENT: normal  Neck: no JVD, carotid bruits, or masses Cardiac: RRR; no murmurs, rubs, or gallops,no edema  Respiratory:  clear to auscultation bilaterally, normal work of breathing GI: soft, nontender, nondistended, + BS MS: no deformity or atrophy  Skin: warm and dry  Neuro:  Strength and sensation are intact Psych: euthymic mood, full affect  EKG:  EKG is ordered today. The ekg ordered today is personally reviewed and shows sinus rhythm 84 bpm, PR 156 msec, QRS 94 msec, nonspecific St/T changes   Recent Labs: 07/18/2017: ALT 8; BUN 13; Creatinine, Ser 1.13; Hemoglobin 13.7; Platelets 276; Potassium 4.3; Sodium 143; TSH 2.040  personally reviewed   Lipid Panel     Component Value Date/Time   CHOL 155 07/18/2017 1319   TRIG 186 (H) 07/18/2017 1319   HDL 36 (L) 07/18/2017 1319   CHOLHDL 4.3 07/18/2017 1319   LDLCALC 82 07/18/2017 1319   personally reviewed   Wt Readings from  Last 3 Encounters:  08/06/17 201 lb (91.2 kg)  07/26/17 203 lb 6.4 oz (92.3 kg)  07/25/17 204 lb 6.4 oz (92.7 kg)      Other studies personally reviewed: Additional studies/ records that were reviewed today include: more than 50 pages of telemetry from 12/2014 admission are personally reviewed.  This reveals typical appearing atrial flutter.  I do not see any documented afib.  Review of the above records today demonstrates: as above   ASSESSMENT AND PLAN:  1.  Typical atrial flutter The patient has a h/o post operative atrial flutter.  I do not see afib documented.  He has been asymptomatic since that time.  His chads2vasc score is at least 5.  He is not a candidate for anticoagulation.  I think that it would be reasonable to pursue atrial flutter ablation to reduce his long term stroke risks.   Risk, benefits, and alternatives to EP study and radiofrequency ablation were also discussed in detail today.  He is clear at this time that he is not interested in ablation.  If he begins to have more sustained atrial flutter with symptoms then he may be more willing to consider at that time. For now, he wishes to continue his current treatment approach.  He will follow-up with Dr Percival Spanish as scheduled I will see as needed going forward   Current medicines are reviewed at length with the patient today.   The patient does not have concerns regarding his medicines.  The following changes were made today:  none  Labs/ tests ordered today include:  Orders Placed This Encounter  Procedures  . EKG 12-Lead     Signed, Thompson Grayer, MD  08/06/2017 9:33 AM     [facility] [address] [place] [address] [phone] (office) [phone] (fax)

## 2017-08-06 NOTE — Patient Instructions (Addendum)
Medication Instructions:  Your physician recommends that you continue on your current medications as directed. Please refer to the Current Medication list given to you today.  Labwork: None ordered.  Testing/Procedures: None ordered.  Follow-Up: Your physician wants you to follow-up as needed with Dr. Rayann Heman.  Any Other Special Instructions Will Be Listed Below (If Applicable).  If you need a refill on your cardiac medications before your next appointment, please call your pharmacy.

## 2017-08-09 ENCOUNTER — Ambulatory Visit (INDEPENDENT_AMBULATORY_CARE_PROVIDER_SITE_OTHER): Payer: Medicare HMO | Admitting: Family Medicine

## 2017-08-09 ENCOUNTER — Telehealth: Payer: Self-pay | Admitting: Family Medicine

## 2017-08-09 ENCOUNTER — Encounter: Payer: Self-pay | Admitting: Family Medicine

## 2017-08-09 VITALS — BP 124/75 | HR 79 | Temp 97.0°F | Ht 73.0 in | Wt 199.4 lb

## 2017-08-09 DIAGNOSIS — E1159 Type 2 diabetes mellitus with other circulatory complications: Secondary | ICD-10-CM

## 2017-08-09 DIAGNOSIS — I483 Typical atrial flutter: Secondary | ICD-10-CM

## 2017-08-09 DIAGNOSIS — I10 Essential (primary) hypertension: Secondary | ICD-10-CM

## 2017-08-09 MED ORDER — GLIPIZIDE 10 MG PO TABS: 10.0000 mg | ORAL_TABLET | Freq: Every day | ORAL | 3 refills | Status: DC

## 2017-08-09 MED ORDER — FUROSEMIDE 20 MG PO TABS: 20.0000 mg | ORAL_TABLET | Freq: Every day | ORAL | 3 refills | Status: DC

## 2017-08-09 MED ORDER — IRBESARTAN 300 MG PO TABS: 300.0000 mg | ORAL_TABLET | Freq: Every day | ORAL | 3 refills | Status: DC

## 2017-08-09 MED ORDER — DAPAGLIFLOZIN PROPANEDIOL 10 MG PO TABS: 10.0000 mg | ORAL_TABLET | Freq: Every day | ORAL | 2 refills | Status: DC

## 2017-08-09 MED ORDER — POTASSIUM CHLORIDE ER 10 MEQ PO TBCR: 10.0000 meq | EXTENDED_RELEASE_TABLET | Freq: Every day | ORAL | 3 refills | Status: DC

## 2017-08-09 MED ORDER — GABAPENTIN 300 MG PO CAPS: 300.0000 mg | ORAL_CAPSULE | Freq: Three times a day (TID) | ORAL | 3 refills | Status: DC

## 2017-08-09 NOTE — Patient Instructions (Signed)
Great to see you!  Call back and let us know about if you are taking [place].   Continue recording your sugars, come in and let us get a copy of the paper once a week.

## 2017-08-09 NOTE — Progress Notes (Signed)
   HPI  Patient presents today for chronic medical conditions.  Type 2 diabetes Average fasting blood sugar is around 150, ranging 89-178. Average postprandial is 150-200. Patient watching his diet moderately. Very good medication compliance of 30 units of Lantus.  Hypertension Good medication compliance, blood pressure at home ranging 130s-160s, however mostly controlled. No headaches or chest pain.  Atrial flutter No palpitations, tolerating medication well.  PMH: Smoking status noted ROS: Per HPI  Objective: BP 124/75   Pulse 79   Temp (!) 97 F (36.1 C) (Oral)   Ht 6\' 1"  (1.854 m)   Wt 199 lb 6.4 oz (90.4 kg)   BMI 26.31 kg/m  Gen: NAD, alert, cooperative with exam HEENT: NCAT CV: RRR, good S1/S2, no murmur Resp: CTABL, no wheezes, non-labored Ext: No edema, warm Neuro: Alert and oriented, No gross deficits  Assessment and plan:  #Atrial flutter Rate controlled currently, has been offered ablation by EP.  #Type 2 diabetes. Uncontrolled, however he is showing signs of improvement. Continue current dose of Lantus, patient's not sure if he is using [place], he will check He has begun modification, we will review his blood sugars once a week and hopefully gain much better control. This appears to be the only barrier to surgery for him  #Hypertension Well-controlled No changes, previous difficult time controlling his blood pressure, continue irbesartan, Lasix, amlodipine,   Meds ordered this encounter  Medications  . furosemide (LASIX) 20 MG tablet    Sig: Take 1 tablet (20 mg total) by mouth daily.    Dispense:  90 tablet    Refill:  3  . gabapentin (NEURONTIN) 300 MG capsule    Sig: Take 1 capsule (300 mg total) by mouth 3 (three) times daily.    Dispense:  90 capsule    Refill:  3  . glipiZIDE (GLUCOTROL) 10 MG tablet    Sig: Take 1 tablet (10 mg total) by mouth daily before breakfast. ((for blood sugar))    Dispense:  90 tablet    Refill:  3  .  irbesartan (AVAPRO) 300 MG tablet    Sig: Take 1 tablet (300 mg total) by mouth daily.    Dispense:  90 tablet    Refill:  3  . potassium chloride (K-DUR) 10 MEQ tablet    Sig: Take 1 tablet (10 mEq total) by mouth daily. Take only on days when taking lasix    Dispense:  90 tablet    Refill:  Taylor Mill, MD [facility] 08/09/2017, 9:25 AM

## 2017-08-09 NOTE — Telephone Encounter (Signed)
Pt notified of RX 

## 2017-08-10 ENCOUNTER — Telehealth: Payer: Self-pay | Admitting: Family Medicine

## 2017-08-10 MED ORDER — SITAGLIPTIN PHOSPHATE 100 MG PO TABS
100.0000 mg | ORAL_TABLET | Freq: Every day | ORAL | 11 refills | Status: DC
Start: 2017-08-10 — End: 2017-08-24

## 2017-08-10 NOTE — Telephone Encounter (Signed)
lmtcb

## 2017-08-10 NOTE — Telephone Encounter (Signed)
His potassium levels have been so good I think he will be ok without potassium.   I will switch him to Januvia, unfortunately this may still be expensive as Wilder Glade is a level 1 Preferred medication.   [name], MD [facility] 08/10/2017, 4:21 PM

## 2017-08-10 NOTE — Telephone Encounter (Signed)
Patient states that his potassium chloride was $41.05 and Wilder Glade was $47. Would like something else sent into the pharmacy.

## 2017-08-13 ENCOUNTER — Telehealth: Payer: Self-pay | Admitting: Family Medicine

## 2017-08-13 NOTE — Telephone Encounter (Signed)
Patient aware and verbalizes understanding. 

## 2017-08-13 NOTE — Telephone Encounter (Signed)
Refer to previous phone note - patient aware and verbalizes understanding.

## 2017-08-16 ENCOUNTER — Ambulatory Visit (INDEPENDENT_AMBULATORY_CARE_PROVIDER_SITE_OTHER): Payer: Medicare HMO | Admitting: *Deleted

## 2017-08-16 ENCOUNTER — Encounter: Payer: Self-pay | Admitting: *Deleted

## 2017-08-16 VITALS — BP 163/81 | HR 78 | Ht 72.0 in | Wt 205.0 lb

## 2017-08-16 DIAGNOSIS — Z0000 Encounter for general adult medical examination without abnormal findings: Secondary | ICD-10-CM | POA: Diagnosis not present

## 2017-08-16 DIAGNOSIS — Z23 Encounter for immunization: Secondary | ICD-10-CM

## 2017-08-16 NOTE — Patient Instructions (Signed)
[name] , Thank you for taking time to come for your Medicare Wellness Visit. I appreciate your ongoing commitment to your health goals. Please review the following plan we discussed and let me know if I can assist you in the future.   These are the goals we discussed: Goals    . Exercise 150 min/wk Moderate Activity       This is a list of the screening recommended for you and due dates:  Health Maintenance  Topic Date Due  . Pneumonia vaccines (2 of 2 - PPSV23) 07/29/2016  . Eye exam for diabetics  12/01/2016  . Complete foot exam   07/25/2017  . Hemoglobin A1C  01/15/2018  . Tetanus Vaccine  04/24/2026  . Flu Shot  Completed   Pneumococcal Polysaccharide Vaccine: What You Need to Know 1. Why get vaccinated? Vaccination can protect older adults (and some children and younger adults) from pneumococcal disease. Pneumococcal disease is caused by bacteria that can spread from person to person through close contact. It can cause ear infections, and it can also lead to more serious infections of the:  Lungs (pneumonia),  Blood (bacteremia), and  Covering of the brain and spinal cord (meningitis). Meningitis can cause deafness and brain damage, and it can be fatal.  Anyone can get pneumococcal disease, but children under 23 years of age, people with certain medical conditions, adults over 68 years of age, and cigarette smokers are at the highest risk. About 18,000 older adults die each year from pneumococcal disease in the [place]. Treatment of pneumococcal infections with penicillin and other drugs used to be more effective. But some strains of the disease have become resistant to these drugs. This makes prevention of the disease, through vaccination, even more important. 2. Pneumococcal polysaccharide vaccine (PPSV23) Pneumococcal polysaccharide vaccine (PPSV23) protects against 23 types of pneumococcal bacteria. It will not prevent all pneumococcal disease. PPSV23 is  recommended for:  All adults 67 years of age and older,  Anyone 2 through 77 years of age with certain long-term health problems,  Anyone 2 through 77 years of age with a weakened immune system,  Adults 60 through 77 years of age who smoke cigarettes or have asthma.  Most people need only one dose of PPSV. A second dose is recommended for certain high-risk groups. People 29 and older should get a dose even if they have gotten one or more doses of the vaccine before they turned 65. Your healthcare provider can give you more information about these recommendations. Most healthy adults develop protection within 2 to 3 weeks of getting the shot. 3. Some people should not get this vaccine  Anyone who has had a life-threatening allergic reaction to PPSV should not get another dose.  Anyone who has a severe allergy to any component of PPSV should not receive it. Tell your provider if you have any severe allergies.  Anyone who is moderately or severely ill when the shot is scheduled may be asked to wait until they recover before getting the vaccine. Someone with a mild illness can usually be vaccinated.  Children less than 78 years of age should not receive this vaccine.  There is no evidence that PPSV is harmful to either a pregnant woman or to her fetus. However, as a precaution, women who need the vaccine should be vaccinated before becoming pregnant, if possible. 4. Risks of a vaccine reaction With any medicine, including vaccines, there is a chance of side effects. These are usually mild and  go away on their own, but serious reactions are also possible. About half of people who get PPSV have mild side effects, such as redness or pain where the shot is given, which go away within about two days. Less than 1 out of 100 people develop a fever, muscle aches, or more severe local reactions. Problems that could happen after any vaccine:  People sometimes faint after a medical procedure, including  vaccination. Sitting or lying down for about 15 minutes can help prevent fainting, and injuries caused by a fall. Tell your doctor if you feel dizzy, or have vision changes or ringing in the ears.  Some people get severe pain in the shoulder and have difficulty moving the arm where a shot was given. This happens very rarely.  Any medication can cause a severe allergic reaction. Such reactions from a vaccine are very rare, estimated at about 1 in a million doses, and would happen within a few minutes to a few hours after the vaccination. As with any medicine, there is a very remote chance of a vaccine causing a serious injury or death. The safety of vaccines is always being monitored. For more information, visit: http://www.aguilar.org/ 5. What if there is a serious reaction? What should I look for? Look for anything that concerns you, such as signs of a severe allergic reaction, very high fever, or unusual behavior. Signs of a severe allergic reaction can include hives, swelling of the face and throat, difficulty breathing, a fast heartbeat, dizziness, and weakness. These would usually start a few minutes to a few hours after the vaccination. What should I do? If you think it is a severe allergic reaction or other emergency that can't wait, call 9-1-1 or get to the nearest hospital. Otherwise, call your doctor. Afterward, the reaction should be reported to the Vaccine Adverse Event Reporting System (VAERS). Your doctor might file this report, or you can do it yourself through the VAERS web site at www.vaers.SamedayNews.es, or by calling 517-172-4488. VAERS does not give medical advice. 6. How can I learn more?  Ask your doctor. He or she can give you the vaccine package insert or suggest other sources of information.  Call your local or state health department.  Contact the Centers for Disease Control and Prevention (CDC): ? Call 740 113 8635 (1-800-CDC-INFO) or ? Visit CDC's website at  http://hunter.com/ CDC Pneumococcal Polysaccharide Vaccine VIS (10/24/13) This information is not intended to replace advice given to you by your health care provider. Make sure you discuss any questions you have with your health care provider. Document Released: 04/16/2006 Document Revised: 03/09/2016 Document Reviewed: 03/09/2016 Elsevier Interactive Patient Education  2017 [employer].

## 2017-08-16 NOTE — Progress Notes (Signed)
Subjective:   Timothy Guerrero is a 77 y.o. male who presents for an Initial Medicare Annual Wellness Visit. Patient lives alone but has a girlfriend that he enjoys spending time with. They go dancing several days a week. 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 He had 3 children but one son died at age 30 from prostate cancer. He has 17 grandchildren.   Review of Systems  Health is the same as last year  Cardiac Risk Factors include: advanced age (>35men, >28 women);diabetes mellitus;dyslipidemia;male gender;hypertension;sedentary lifestyle  Musculoskeletal: R shoulder needs replacing. Seeing Dr supple   Objective:    Today's Vitals   08/16/17 0853  BP: (!) 163/81  Pulse: 78  Weight: 205 lb (93 kg)  Height: 6' (1.829 m)   Body mass index is 27.8 kg/m.  Advanced Directives 08/16/2017 11/17/2016 11/07/2016 07/25/2016 03/14/2016 01/12/2016 01/03/2016  Does Patient Have a Medical Advance Directive? Yes No No Yes Yes Yes No  Type of Paramedic of [place];Living will - - Living will Living will;Healthcare Power of Attorney - -  Does patient want to make changes to medical advance directive? No - Patient declined - - No - Patient declined - - -  Copy of [place] in Chart? No - copy requested - - - No - copy requested - -  Would patient like information on creating a medical advance directive? - No - Patient declined No - Patient declined - - - No - patient declined information    Current Medications (verified) Outpatient Encounter Medications as of 08/16/2017  Medication Sig  . amLODipine (NORVASC) 10 MG tablet Take 10 mg by mouth daily. ((blood pressure))  . diltiazem (CARDIZEM) 30 MG tablet Take 1 tablet every 4 hours AS NEEDED for AFIB heart rate >100  . fluticasone (FLONASE) 50 MCG/ACT nasal spray Place 2 sprays into both nostrils daily.  . fluticasone furoate-vilanterol (BREO ELLIPTA) 200-25 MCG/INH AEPB Inhale 1 puff daily  into the lungs.  . furosemide (LASIX) 20 MG tablet Take 1 tablet (20 mg total) by mouth daily.  [name] gabapentin (NEURONTIN) 300 MG capsule Take 1 capsule (300 mg total) by mouth 3 (three) times daily.  [name] glipiZIDE (GLUCOTROL) 10 MG tablet Take 1 tablet (10 mg total) by mouth daily before breakfast. ((for blood sugar))  . Insulin Glargine (LANTUS SOLOSTAR) 100 UNIT/ML Solostar Pen Inject 20-40 Units into the skin daily at 10 pm.  . irbesartan (AVAPRO) 300 MG tablet Take 1 tablet (300 mg total) by mouth daily.  [name] levothyroxine (SYNTHROID, LEVOTHROID) 50 MCG tablet Take 1 tablet (50 mcg total) by mouth daily before breakfast. ((for thyroid))  . metFORMIN (GLUCOPHAGE) 1000 MG tablet Take 1 tablet (1,000 mg total) by mouth 2 (two) times daily with a meal.  . metoprolol (TOPROL-XL) 200 MG 24 hr tablet Take 1 tablet (200 mg total) by mouth daily.  . Multiple Vitamins-Minerals (CENTRUM SILVER 50+MEN) TABS Take 1 tablet by mouth daily.  . pantoprazole (PROTONIX) 40 MG tablet Take 1 tablet (40 mg total) by mouth daily.  . sitaGLIPtin (JANUVIA) 100 MG tablet Take 1 tablet (100 mg total) by mouth daily.  . valACYclovir (VALTREX) 1000 MG tablet Take 1,000 mg by mouth as needed (cold sores).   . dapagliflozin propanediol (FARXIGA) 10 MG TABS tablet Take 10 mg by mouth daily.  . potassium chloride (K-DUR) 10 MEQ tablet Take 1 tablet (10 mEq total) by mouth daily. Take only on days  when taking lasix (Patient not taking: Reported on 08/16/2017)   No facility-administered encounter medications on file as of 08/16/2017.     Allergies (verified) Patient has no known allergies.   History: Past Medical History:  Diagnosis Date  . Anemia   . Arthritis   . Atrial flutter ([place])    post op following lobectomy  . Benign essential tremor   . Blood transfusion without reported diagnosis   . Cataract   . CHF (congestive heart failure) ([place])   . Colon polyp    2007, polyp with early cancer, one year f/u no residual  polyp. next TCS 2010, see PSH. [facility].  [name] COPD (chronic obstructive pulmonary disease) ([place])   . Diabetes mellitus without complication ([place])    10 years  . GERD (gastroesophageal reflux disease)   . HOH (hard of hearing)   . Hypercholesteremia   . Hypertension    10 years  . Lung cancer ([place])   . Prostate cancer ([place])   . Shortness of breath dyspnea    due to lung mass  . Skin melanoma ([place])   . Sleep apnea    wears CPAP sometimes, cannot tolerate all the time.   Past Surgical History:  Procedure Laterality Date  . bottom lobe of right lung removed  12/17/14  . CARDIAC CATHETERIZATION     no PCI  . CATARACT EXTRACTION Left   . CATARACT EXTRACTION W/PHACO Left 01/03/2016   Procedure: CATARACT EXTRACTION PHACO AND INTRAOCULAR LENS PLACEMENT LEFT EYE CDE=7.78;  Surgeon: Williams Che, MD;  Location: [facility];  Service: Ophthalmology;  Laterality: Left;  . CATARACT EXTRACTION W/PHACO Right 03/20/2016   Procedure: CATARACT EXTRACTION PHACO AND INTRAOCULAR LENS PLACEMENT; CDE:  9.58;  Surgeon: Williams Che, MD;  Location: [facility];  Service: Ophthalmology;  Laterality: Right;  . COLONOSCOPY  08/2008   Dr. Leafy Half, [place]: normal, internal grade 1 hemorrhoids. next TCS 08/2013  . COLONOSCOPY N/A 03/04/2014   Procedure: COLONOSCOPY;  Surgeon: Daneil Dolin, MD;  [facility];  Service: Endoscopy;  Laterality: N/A;  7:30am  . EYE SURGERY    . knee replacements     bilateral. over 10 years ago  . LOBECTOMY Right 12/17/2014   Procedure: RIGHT LOWER LOBECTOMY;  Surgeon: Melrose Nakayama, MD;  Location: [facility];  Service: Thoracic;  Laterality: Right;  . LYMPH NODE DISSECTION Right 12/17/2014   Procedure: LYMPH NODE DISSECTION;  Surgeon: Melrose Nakayama, MD;  Location: [place] [facility];  Service: Thoracic;  Laterality: Right;  . NECK SURGERY     melanoma removed.  [name] PROSTATE SURGERY     prostatectomy, radical  . TOTAL HIP ARTHROPLASTY  2007     left  . total shoulder replacement  2011   left  . VIDEO ASSISTED THORACOSCOPY (VATS)/WEDGE RESECTION Right 12/17/2014   Procedure: RIGHT VIDEO ASSISTED THORACOSCOPY (VATS);  Surgeon: Melrose Nakayama, MD;  Location: [facility];  Service: Thoracic;  Laterality: Right;  [name] VIDEO BRONCHOSCOPY Bilateral 11/17/2016   Procedure: VIDEO BRONCHOSCOPY WITHOUT FLUORO;  Surgeon: Javier Glazier, MD;  Location: [facility];  Service: Cardiopulmonary;  Laterality: Bilateral;   Family History  Problem Relation Age of Onset  . Cancer Mother        unknown type  . COPD Sister   . Breast cancer Sister   . Cancer Sister 70       breast  . Prostate cancer Son  died age 43  . Heart attack Daughter   . Prostate cancer Son   . Colon cancer Neg Hx    Social History   Socioeconomic History  . Marital status: Divorced    Spouse name: Not on file  . Number of children: 3  . Years of education: associates  . Highest education level: Associate degree: occupational, Hotel manager, or vocational program  Social Needs  . Financial resource strain: Not hard at all  . Food insecurity - worry: Never true  . Food insecurity - inability: Never true  . Transportation needs - medical: No  . Transportation needs - non-medical: No  Occupational History  . Occupation: retired    Comment: truck Geophysicist/field seismologist  Tobacco Use  . Smoking status: Former Smoker    Packs/day: 2.00    Years: 32.00    Pack years: 64.00    Types: Cigarettes    Last attempt to quit: 07/03/1986    Years since quitting: 31.1  . Smokeless tobacco: Never Used  Substance and Sexual Activity  . Alcohol use: No    Alcohol/week: 0.0 oz    Comment: rarely wine  . Drug use: No  . Sexual activity: Yes  Other Topics Concern  . Not on file  Social History Narrative   [employer].  Lives alone.      [facility]:   Originally from [facility]. Served in [employer] with significant exposure to asbestos. 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 He has  also lived in [place], [place], & [place]. He has lived in [place] since 1979. 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 No pets currently. Remote bird exposure.       Patient is divorced but has had a girlfriend for 7 months. He enjoys going dancing several times a week. He had 3 children but one passed away at 2 from prostate cancer. He has 17 grandchildren.    Tobacco Counseling No tobacco use  Clinical Intake:     Pain : (Hurts "terrible") Pain Type: Chronic pain Pain Location: Shoulder Pain Orientation: Right Pain Descriptors / Indicators: Aching Pain Onset: More than a month ago Pain Frequency: Constant Effect of Pain on Daily Activities: moderate     Nutritional Status: BMI of 19-24  Normal Diabetes: Yes CBG done?: No Did pt. bring in CBG monitor from home?: No  How often do you need to have someone help you when you read instructions, pamphlets, or other written materials from your doctor or pharmacy?: 1 - Never What is the last grade level you completed in school?: asscoaties degree  Interpreter Needed?: No  Information entered by :: Chong Sicilian, RN  Activities of Daily Living In your present state of health, do you have any difficulty performing the following activities: 08/16/2017  Hearing? Y  Comment patient has hearing aids  Vision? N  Comment eye exam is up to date. has had lens implants  Difficulty concentrating or making decisions? Y  Comment sidetracked at times  Walking or climbing stairs? N  Dressing or bathing? N  Doing errands, shopping? N  Preparing Food and eating ? N  Using the Toilet? N  In the past six months, have you accidently leaked urine? N  Do you have problems with loss of bowel control? N  Managing your Medications? N  Managing your Finances? N  Housekeeping or managing your Housekeeping? N  Some recent data might be hidden     Immunizations and Health Maintenance Immunization History  Administered  Date(s) Administered  . Influenza, High Dose  Seasonal PF 04/19/2016  . Influenza,inj,Quad PF,6+ Mos 03/31/2015  . Influenza,inj,quad, With Preservative 01/31/2017  . Pneumococcal Conjugate-13 07/30/2015  . Pneumococcal Polysaccharide-23 08/16/2017  . Tdap 04/24/2016   Health Maintenance Due  Topic Date Due  . OPHTHALMOLOGY EXAM  12/01/2016  . FOOT EXAM  07/25/2017    Patient Care Team: [name], MD as PCP - General (Family Medicine) Gala Romney Cristopher Estimable, MD as Consulting Physician (Gastroenterology) Martinique, Amy, MD as Consulting Physician (Dermatology) Minus Breeding, MD as Consulting Physician (Cardiology) Javier Glazier, MD as Consulting Physician (Pulmonary Disease)  No hospitalizations, ER visits, or surgeries this past year.      Assessment:   This is a routine wellness examination for [facility].  Hearing/Vision screen No deficits noted during visit. Eye exam at [facility] 2 months ago  Dietary issues and exercise activities discussed: Current Exercise Habits: The patient does not participate in regular exercise at present(Patient was exercising before problem with right shoulder began. He plans to resume once he has surgery), Exercise limited by: orthopedic condition(s)  Goals    . Exercise 150 min/wk Moderate Activity      Depression Screen PHQ 2/9 Scores 08/16/2017 08/09/2017 07/26/2017 07/17/2017  PHQ - 2 Score 0 0 0 0  PHQ- 9 Score - - - -    Fall Risk Fall Risk  08/16/2017 08/09/2017 07/26/2017 07/17/2017 06/15/2017  Falls in the past year? No No No No No  Number falls in past yr: - - - - -  Injury with Fall? - - - - -  Risk for fall due to : - - - - -     Cognitive Function: MMSE - Mini Mental State Exam 08/16/2017 07/25/2016 05/13/2015  Orientation to time 5 5 5   Orientation to Place 5 5 5   Registration 3 3 3   Attention/ Calculation 5 3 5   Recall 3 3 3   Language- name 2 objects 2 2 2   Language- repeat 1 1 1   Language- follow 3 step command 3 1 3   Language- read & follow direction 1 1 1   Write a  sentence 1 1 0  Copy design 1 1 0  Copy design-comments - - tremor make it difficult for patient to draw and write  Total score 30 26 28     Normal exam. Patient does have a tremor which makes it difficult to draw and write but he was able to execute that task correctly.     Screening Tests Health Maintenance  Topic Date Due  . OPHTHALMOLOGY EXAM  12/01/2016  . FOOT EXAM  07/25/2017  . HEMOGLOBIN A1C  01/15/2018  . TETANUS/TDAP  04/24/2026  . INFLUENZA VACCINE  Completed  . PNA vac Low Risk Adult  Completed       Plan:  Plans to have Shingrix at [place] where he can get it for free Increase physical activity. Aim for at least 150 minutes of moderate activity a week. Dancing may accomplish this Keep f/u with PCP  I have personally reviewed and noted the following in the patient's chart:   . Medical and social history . Use of alcohol, tobacco or illicit drugs  . Current medications and supplements . Functional ability and status . Nutritional status . Physical activity . Advanced directives . List of other physicians . Hospitalizations, surgeries, and ER visits in previous 12 months . Vitals . Screenings to include cognitive, depression, and falls . Referrals and appointments  In addition, I have reviewed  and discussed with patient certain preventive protocols, quality metrics, and best practice recommendations. A written personalized care plan for preventive services as well as general preventive health recommendations were provided to patient.     Chong Sicilian, RN   08/16/2017    I have reviewed and agree with the above AWV documentation.   [name], MD [facility] 08/17/2017, 11:59 AM

## 2017-08-24 ENCOUNTER — Encounter: Payer: Self-pay | Admitting: Family Medicine

## 2017-08-24 ENCOUNTER — Ambulatory Visit (INDEPENDENT_AMBULATORY_CARE_PROVIDER_SITE_OTHER): Payer: Medicare HMO | Admitting: Family Medicine

## 2017-08-24 VITALS — BP 139/78 | HR 75 | Temp 97.2°F | Ht 72.0 in | Wt 203.0 lb

## 2017-08-24 DIAGNOSIS — E1159 Type 2 diabetes mellitus with other circulatory complications: Secondary | ICD-10-CM | POA: Diagnosis not present

## 2017-08-24 DIAGNOSIS — I10 Essential (primary) hypertension: Secondary | ICD-10-CM | POA: Diagnosis not present

## 2017-08-24 MED ORDER — LIRAGLUTIDE 18 MG/3ML ~~LOC~~ SOPN: 0.6000 mg | PEN_INJECTOR | Freq: Every day | SUBCUTANEOUS | 3 refills | Status: DC

## 2017-08-24 MED ORDER — LIRAGLUTIDE 18 MG/3ML ~~LOC~~ SOPN: 0.6000 mg | PEN_INJECTOR | Freq: Every day | SUBCUTANEOUS | 11 refills | Status: DC

## 2017-08-24 NOTE — Progress Notes (Signed)
   HPI  Patient presents today to follow-up for diabetes and hypertension.  Hypertension Much better controlled, fluctuating at home in the 130s and 140s, some elevated readings. No headache or chest pain.  Diabetes Average fasting blood sugar ranging from 86-176, average appears to be around 150. No hypoglycemia. Average postprandial is 170 2-50. Patient previously did very well with Trulicity but lost so much weight that he quit.  PMH: Smoking status noted ROS: Per HPI  Objective: BP 139/78   Pulse 75   Temp (!) 97.2 F (36.2 C) (Oral)   Ht 6' (1.829 m)   Wt 203 lb (92.1 kg)   BMI 27.53 kg/m  Gen: NAD, alert, cooperative with exam HEENT: NCAT CV: RRR, good S1/S2, no murmur Resp: CTABL, no wheezes, non-labored Ext: No edema, warm Neuro: Alert and oriented, No gross deficits  Assessment and plan:  #Type 2 diabetes Uncontrolled We have had difficulty with the patient being able to afford medicines and getting a clear representation of what he is actually taking. I feel confident that today we know that he is taking glipizide, metformin, and 30 units of Lantus daily. Continue those, adding Victoza at the lowest dose given his previous aggressive response to Trulicity. I have sent a prescription to his local pharmacy, I have also given him a printed prescription for 21-month supply that he can take to the [place] for better proximal   #Hypertension Difficult to control, now controlled No changes   Meds ordered this encounter  Medications  . DISCONTD: liraglutide (VICTOZA) 18 MG/3ML SOPN    Sig: Inject 0.1 mLs (0.6 mg total) into the skin daily.    Dispense:  1 pen    Refill:  11  . liraglutide (VICTOZA) 18 MG/3ML SOPN    Sig: Inject 0.1 mLs (0.6 mg total) into the skin daily.    Dispense:  3 pen    Refill:  Raymond, MD [facility] 08/24/2017, 9:02 AM

## 2017-08-24 NOTE — Patient Instructions (Signed)
Great to see you!  For your diabetes: Continue glipizide, metformin, and Lantus at your current doses.  I have started you on Victoza, take 0.6 mg once daily, you will see improvement in about 1 week.

## 2017-08-27 ENCOUNTER — Other Ambulatory Visit: Payer: Self-pay | Admitting: Family Medicine

## 2017-08-27 MED ORDER — LEVOTHYROXINE SODIUM 50 MCG PO TABS: 50.0000 ug | ORAL_TABLET | Freq: Every day | ORAL | 3 refills | Status: DC

## 2017-08-27 NOTE — Telephone Encounter (Signed)
Rx sent to [facility]

## 2017-09-03 ENCOUNTER — Telehealth: Payer: Self-pay | Admitting: Family Medicine

## 2017-09-03 MED ORDER — LIRAGLUTIDE 18 MG/3ML ~~LOC~~ SOPN: 0.6000 mg | PEN_INJECTOR | Freq: Every day | SUBCUTANEOUS | 3 refills | Status: DC

## 2017-09-03 NOTE — Telephone Encounter (Signed)
Spoke with patient and Luiz Iron ([place], about patient's Victoza.  The prescription was originally sent to [facility], but when patient tried to pick up it was too expensive for him, $300.00.  If you can do a prescription that can be faxed to Dr. Romero Liner at the [facility] [phone] fax number, it will be much less expensive.  Please advise.

## 2017-09-03 NOTE — Telephone Encounter (Signed)
Rx printed to send to [place].   [name], MD [facility] 09/03/2017, 1:59 PM

## 2017-09-03 NOTE — Telephone Encounter (Signed)
Returned call to Vance Gather at [facility] (extension [phone]), got voicemail, lmtcb.

## 2017-09-03 NOTE — Telephone Encounter (Signed)
[facility]ax[phone] [place]

## 2017-09-04 ENCOUNTER — Telehealth: Payer: Self-pay

## 2017-09-04 NOTE — Telephone Encounter (Signed)
Spoke with Madelin Headings about patient - she received a fax for the patients Victozia. Refer to previous phone note.  States she needed the last office note showing that patient needed to be on Victoza.  Office note faxed to [phone].

## 2017-09-05 ENCOUNTER — Telehealth: Payer: Self-pay | Admitting: *Deleted

## 2017-09-05 NOTE — Telephone Encounter (Signed)
For provider when returning to work.  Patient needs Victoza but the [place] doctor would like to give Senaglutide because they do not have Victoza in stock.   Please advise if this would be acceptable.  Caller was Elmyra Ricks ,nurse at the provider's office.   [phone].

## 2017-09-06 NOTE — Telephone Encounter (Signed)
Returned Nichol's call- she is out of the office today and will call back 3/8

## 2017-09-06 NOTE — Telephone Encounter (Signed)
Ozempic would be ok but only at low dose 0.25 mg once weekly.   Pyt previously had weight loss that concerned him on full dose trulicity.   [name], MD [facility] 09/06/2017, 7:52 AM

## 2017-09-07 NOTE — Telephone Encounter (Signed)
[facility] aware [facility]

## 2017-09-19 ENCOUNTER — Telehealth: Payer: Self-pay | Admitting: Family Medicine

## 2017-09-19 NOTE — Telephone Encounter (Signed)
Patient aware per phone note 3/7 he is supposed to be on Ozempic 0.25mg  one weekly. Nicole at [facility] was aware 3/8.

## 2017-09-19 NOTE — Telephone Encounter (Signed)
lmtcb

## 2017-10-05 ENCOUNTER — Other Ambulatory Visit: Payer: Self-pay

## 2017-10-05 DIAGNOSIS — Z85118 Personal history of other malignant neoplasm of bronchus and lung: Secondary | ICD-10-CM

## 2017-10-05 DIAGNOSIS — R042 Hemoptysis: Secondary | ICD-10-CM

## 2017-10-08 ENCOUNTER — Telehealth: Payer: Self-pay | Admitting: Family Medicine

## 2017-10-08 MED ORDER — AMLODIPINE BESYLATE 10 MG PO TABS: 10.0000 mg | ORAL_TABLET | Freq: Every day | ORAL | 0 refills | Status: DC

## 2017-10-08 MED ORDER — METFORMIN HCL 1000 MG PO TABS: 1000.0000 mg | ORAL_TABLET | Freq: Two times a day (BID) | ORAL | 0 refills | Status: DC

## 2017-10-08 MED ORDER — POTASSIUM CHLORIDE ER 10 MEQ PO TBCR: 10.0000 meq | EXTENDED_RELEASE_TABLET | Freq: Every day | ORAL | 0 refills | Status: DC

## 2017-10-08 NOTE — Telephone Encounter (Signed)
Pt is needing refills on Metformin, potassium and norvasc sent to [facility]

## 2017-10-16 ENCOUNTER — Ambulatory Visit (INDEPENDENT_AMBULATORY_CARE_PROVIDER_SITE_OTHER): Payer: Medicare HMO | Admitting: Family Medicine

## 2017-10-16 ENCOUNTER — Encounter: Payer: Self-pay | Admitting: Family Medicine

## 2017-10-16 ENCOUNTER — Telehealth: Payer: Self-pay | Admitting: Family Medicine

## 2017-10-16 ENCOUNTER — Ambulatory Visit: Payer: Medicare HMO | Admitting: Thoracic Surgery (Cardiothoracic Vascular Surgery)

## 2017-10-16 ENCOUNTER — Ambulatory Visit
Admission: RE | Admit: 2017-10-16 | Discharge: 2017-10-16 | Disposition: A | Discharge status: Home / Self Care | Payer: Medicare HMO | Source: Ambulatory Visit | Attending: Thoracic Surgery (Cardiothoracic Vascular Surgery) | Admitting: Thoracic Surgery (Cardiothoracic Vascular Surgery)

## 2017-10-16 VITALS — BP 173/83 | HR 72 | Temp 97.9°F | Ht 72.0 in | Wt 201.6 lb

## 2017-10-16 DIAGNOSIS — E1159 Type 2 diabetes mellitus with other circulatory complications: Secondary | ICD-10-CM | POA: Diagnosis not present

## 2017-10-16 DIAGNOSIS — I10 Essential (primary) hypertension: Secondary | ICD-10-CM | POA: Diagnosis not present

## 2017-10-16 DIAGNOSIS — Z85118 Personal history of other malignant neoplasm of bronchus and lung: Secondary | ICD-10-CM

## 2017-10-16 DIAGNOSIS — R042 Hemoptysis: Secondary | ICD-10-CM

## 2017-10-16 LAB — BAYER DCA HB A1C WAIVED: HB A1C (BAYER DCA - WAIVED): 9.1 % — ABNORMAL HIGH (ref <7.0)

## 2017-10-16 MED ORDER — SEMAGLUTIDE(0.25 OR 0.5MG/DOS) 2 MG/1.5ML ~~LOC~~ SOPN: 0.2500 mg | PEN_INJECTOR | SUBCUTANEOUS | Status: DC

## 2017-10-16 MED ORDER — POTASSIUM CHLORIDE ER 20 MEQ PO TBCR: 20.0000 meq | EXTENDED_RELEASE_TABLET | Freq: Every day | ORAL | 3 refills | Status: DC

## 2017-10-16 NOTE — Progress Notes (Signed)
   HPI  Patient presents today here for follow-up diabetes and hypertension.  Diabetes Average fasting blood sugar ranges between 70 and 140 No hypoglycemia Good medication compliance with Lantus plus glipizide plus metformin. Patient has recently started ozempic  Hypertension Typical blood pressure at home has been 140s over 70s No headache or chest pain.  Patient continues to have right shoulder pain and desires to have surgery, however his lack of diabetic control is preventing him.  PMH: Smoking status noted ROS: Per HPI  Objective: BP (!) 173/83   Pulse 72   Temp 97.9 F (36.6 C) (Oral)   Ht 6' (1.829 m)   Wt 201 lb 9.6 oz (91.4 kg)   BMI 27.34 kg/m  Gen: NAD, alert, cooperative with exam HEENT: NCAT CV: RRR, good S1/S2, no murmur Resp: CTABL, no wheezes, non-labored Ext: No edema, warm Neuro: Alert and oriented, No gross deficits  Assessment and plan:  #Type 2 diabetes Control improving, A1c now at 9.1, however uncontrolled Patient has startedozempic, no changes I be glad to repeat A1c earlier to see if we can get him cleared for surgery.  #Hypertension Elevated today, however well controlled last visit uncontrolled at home. No changes  Orders Placed This Encounter  Procedures  . Bayer DCA Hb A1c Waived  . [place], MD [place] 10/16/2017, 9:24 AM

## 2017-10-16 NOTE — Telephone Encounter (Signed)
Pt aware of notes -- he is not sure if he is currently on Farxiga - he will look a and let us know.

## 2017-10-16 NOTE — Telephone Encounter (Signed)
Pt requesting some meds be sent to [place].   Timothy Guerrero- npot currently on list, he could tolerate it probably but we will likely need to de-escalate  Januvia- DC, he now on ozempic  Potassium- sent  "Propanedinol 10 mg"- I belive this is propranolol, He is on toprol, so should not need this.   [name], MD [facility] 10/16/2017, 11:25 AM

## 2017-10-17 ENCOUNTER — Telehealth: Payer: Self-pay | Admitting: Family Medicine

## 2017-10-17 ENCOUNTER — Encounter: Payer: Self-pay | Admitting: Family Medicine

## 2017-10-17 LAB — BMP8+EGFR
BUN/Creatinine Ratio: 12 (ref 10–24)
BUN: 10 mg/dL (ref 8–27)
CO2: 26 mmol/L (ref 20–29)
Calcium: 9.6 mg/dL (ref 8.6–10.2)
Chloride: 103 mmol/L (ref 96–106)
Creatinine, Ser: 0.86 mg/dL (ref 0.76–1.27)
GFR calc Af Amer: 97 mL/min/{1.73_m2} (ref >59)
GFR calc non Af Amer: 84 mL/min/{1.73_m2} (ref >59)
Glucose: 215 mg/dL — ABNORMAL HIGH (ref 65–99)
Potassium: 4.3 mmol/L (ref 3.5–5.2)
Sodium: 142 mmol/L (ref 134–144)

## 2017-10-18 MED ORDER — SEMAGLUTIDE(0.25 OR 0.5MG/DOS) 2 MG/1.5ML ~~LOC~~ SOPN: 0.2500 mg | PEN_INJECTOR | SUBCUTANEOUS | 0 refills | Status: DC

## 2017-10-18 MED ORDER — POTASSIUM CHLORIDE ER 20 MEQ PO TBCR: 20.0000 meq | EXTENDED_RELEASE_TABLET | Freq: Every day | ORAL | 0 refills | Status: DC

## 2017-10-18 NOTE — Telephone Encounter (Signed)
Printed Rxs faxed

## 2017-10-23 ENCOUNTER — Other Ambulatory Visit: Payer: Self-pay

## 2017-10-23 DIAGNOSIS — F419 Anxiety disorder, unspecified: Secondary | ICD-10-CM

## 2017-10-23 DIAGNOSIS — R042 Hemoptysis: Secondary | ICD-10-CM

## 2017-10-23 MED ORDER — DIAZEPAM 5 MG PO TABS: 5.0000 mg | ORAL_TABLET | Freq: Four times a day (QID) | ORAL | 0 refills | Status: DC | PRN

## 2017-10-30 ENCOUNTER — Other Ambulatory Visit: Payer: Self-pay | Admitting: *Deleted

## 2017-10-30 ENCOUNTER — Other Ambulatory Visit: Payer: Self-pay

## 2017-10-30 ENCOUNTER — Ambulatory Visit
Admission: RE | Admit: 2017-10-30 | Discharge: 2017-10-30 | Disposition: A | Discharge status: Home / Self Care | Payer: Medicare HMO | Source: Ambulatory Visit | Attending: Thoracic Surgery (Cardiothoracic Vascular Surgery) | Admitting: Thoracic Surgery (Cardiothoracic Vascular Surgery)

## 2017-10-30 ENCOUNTER — Ambulatory Visit: Payer: Medicare HMO | Admitting: Thoracic Surgery (Cardiothoracic Vascular Surgery)

## 2017-10-30 ENCOUNTER — Encounter: Payer: Self-pay | Admitting: Thoracic Surgery (Cardiothoracic Vascular Surgery)

## 2017-10-30 VITALS — BP 118/66 | HR 100 | Resp 16 | Ht 72.0 in | Wt 201.0 lb

## 2017-10-30 DIAGNOSIS — Z902 Acquired absence of lung [part of]: Secondary | ICD-10-CM

## 2017-10-30 DIAGNOSIS — R042 Hemoptysis: Secondary | ICD-10-CM

## 2017-10-30 DIAGNOSIS — Z85118 Personal history of other malignant neoplasm of bronchus and lung: Secondary | ICD-10-CM

## 2017-10-30 NOTE — H&P (View-Only) (Signed)
[address]       [place] [address]             [phone]      HPI: Timothy Guerrero returns for follow-up  Timothy Guerrero is a 77 year old gentleman with a history of tobacco abuse and asbestos exposure.  He also has a history of type 2 diabetes without complication, hypertension, hyperlipidemia, melanoma, prostate cancer, postoperative atrial flutter, and sleep apnea.  I did a thoracoscopic right lower lobectomy for stage IA non-small cell carcinoma in June 2016.  A small area of the right upper lobe was taken en bloc with the initial resection to ensure an adequate margin, but there was no true invasion across the fissure.  I saw him in July 2017.  He was doing well at that time and wished to follow-up with Dr. Whitney Muse at [facility].  He also was followed by Dr. Ashok Cordia for his COPD.  He has been having problems with hemoptysis for the past year.  This is waxed and waned to some degree.  He was taken off of anticoagulation for presumed gastrointestinal bleed.  He continued to have the hemoptysis after that.  He says this almost always occurs in the morning after his first gotten up.  He does not see it later in the day.  His cough tends to be dry except in the mornings.  He had a bronchoscopy by Dr. Ashok Cordia in May 2018.  There were no bloody secretions and no airway abnormalities.  The bronchial stump was well-healed.  He has not had any follow-up scans since a CT in early 2018.  Overall he feels well.  He has not had any problems with appetite or weight loss.  He continues to have mild assist in the mornings.  His cough is dry later in the day.  He has not had any nosebleeds.  He gets short of breath with exertion.  That is stable.  He is not having any exertional chest pain, pressure, or tightness.  He sometimes feels a burning sensation in his right chest anterior to his incision he can last around 20 to 30 minutes.  It is not exertional in nature.  Zubrod Score: At the time  of surgery this patient's most appropriate activity status/level should be described as: []     0    Normal activity, no symptoms [x]     1    Restricted in physical strenuous activity but ambulatory, able to do out light work []     2    Ambulatory and capable of self care, unable to do work activities, up and about >50 % of waking hours                              []     3    Only limited self care, in bed greater than 50% of waking hours []     4    Completely disabled, no self care, confined to bed or chair []     5    Moribund  Current Outpatient Medications  Medication Sig Dispense Refill  . amLODipine (NORVASC) 10 MG tablet Take 1 tablet (10 mg total) by mouth daily. ((blood pressure)) 90 tablet 0  . diazepam (VALIUM) 5 MG tablet Take 1 tablet (5 mg total) by mouth every 6 (six) hours as needed for anxiety. 2 tablet 0  . diltiazem (CARDIZEM) 30 MG tablet Take 1 tablet  every 4 hours AS NEEDED for AFIB heart rate >100 45 tablet 1  . fluticasone (FLONASE) 50 MCG/ACT nasal spray Place 2 sprays into both nostrils daily. 16 g 6  . fluticasone furoate-vilanterol (BREO ELLIPTA) 200-25 MCG/INH AEPB Inhale 1 puff daily into the lungs. 3 each 3  . furosemide (LASIX) 20 MG tablet Take 1 tablet (20 mg total) by mouth daily. 90 tablet 3  . glipiZIDE (GLUCOTROL) 10 MG tablet Take 1 tablet (10 mg total) by mouth daily before breakfast. ((for blood sugar)) 90 tablet 3  . Insulin Glargine (LANTUS SOLOSTAR) 100 UNIT/ML Solostar Pen Inject 20-40 Units into the skin daily at 10 pm. 5 pen PRN  . irbesartan (AVAPRO) 300 MG tablet Take 1 tablet (300 mg total) by mouth daily. 90 tablet 3  . levothyroxine (SYNTHROID, LEVOTHROID) 50 MCG tablet Take 1 tablet (50 mcg total) by mouth daily before breakfast. ((for thyroid)) 90 tablet 3  . metFORMIN (GLUCOPHAGE) 1000 MG tablet Take 1 tablet (1,000 mg total) by mouth 2 (two) times daily with a meal. 180 tablet 0  . metoprolol (TOPROL-XL) 200 MG 24 hr tablet Take 1 tablet (200  mg total) by mouth daily. 90 tablet 3  . Multiple Vitamins-Minerals (CENTRUM SILVER 50+MEN) TABS Take 1 tablet by mouth daily.    . Potassium Chloride ER 20 MEQ TBCR Take 20 mEq by mouth daily. Take only on days when taking lasix 90 tablet 0  . Semaglutide (OZEMPIC) 0.25 or 0.5 MG/DOSE SOPN Inject 0.25 mg into the skin once a week. 9 mL 0  . valACYclovir (VALTREX) 1000 MG tablet Take 1,000 mg by mouth as needed (cold sores).     . pantoprazole (PROTONIX) 40 MG tablet Take 1 tablet (40 mg total) by mouth daily. 90 tablet 1   No current facility-administered medications for this visit.     Physical Exam BP 118/66 (BP Location: Right Arm, Patient Position: Sitting, Cuff Size: Large)   Pulse 100   Resp 16   Ht 6' (1.829 m)   Wt 201 lb (91.2 kg)   BMI 27.59 kg/m  77 year old man in no acute distress Alert and oriented x3 with no focal deficits, mild resting tremor No cervical or supra clavicular adenopathy Incisions well-healed Cardiac regular rate and rhythm normal S1 and S2 Lungs clear with equal breath sounds bilaterally No clubbing cyanosis or edema  Diagnostic Tests: CT CHEST WITHOUT CONTRAST  TECHNIQUE: Multidetector CT imaging of the chest was performed following the standard protocol without IV contrast.  COMPARISON:  None.  FINDINGS: Cardiovascular: Coronary artery calcification and aortic atherosclerotic calcification.  Mediastinum/Nodes: No axillary or supraclavicular adenopathy. No mediastinal hilar adenopathy. No pericardial effusion. Esophagus normal.  Lungs/Pleura: Lungs are hyperinflated. Centrilobular emphysema present. Several benign-appearing nodular calcifications over the RIGHT hemidiaphragm.  Surgical suture line in the RIGHT lower lobe with stable nodular thickening best seen on coronal imaging (image 101/6).  Within the trachea, there is non dependent thickening with nodularity along the ventral margin of the trachea approximately 6 cm  superior to the carina. (Image 39/series 10 and 4). Coronal series 6). Thickening to 4 mm.  Upper Abdomen: Limited view of the liver, kidneys, pancreas are unremarkable. Normal adrenal glands. Simple fluid attenuation lesion of the RIGHT kidney. Nodular thickening in the RIGHT adrenal gland to 15 mm. Unchanged from prior.  Musculoskeletal: No aggressive osseous lesion  IMPRESSION: 1. Nodular thickening along the nondependent surface of the mid trachea. Differential including neoplasm versus adherent secretions. Stable postsurgical change in the RIGHT  lower lobe without suspicious nodularity. 2. RIGHT lower lobectomy without evidence of recurrence along the suture line. Stable thickening compared to CT 09/01/2016 3. Hyperinflated lungs consistent with emphysema.  These results will be called to the ordering clinician or representative by the Radiologist Assistant, and communication documented in the PACS or zVision Dashboard.   Electronically Signed   By: Suzy Bouchard M.D.   On: 10/30/2017 14:27 I personally reviewed the CT images and concur with the findings noted above.  Impression: Timothy Guerrero is a 77 year old gentleman with a history of tobacco abuse and asbestos exposure.  He had a right lower lobectomy for a stage Ia non-small cell carcinoma back in June 2016.  He is now 3 years out with no evidence of recurrent disease on CT.  Hemoptysis-he continues to have hemoptysis which he says almost always occurs in the mornings.  He coughs frequently throughout the day but does not see blood at other times.  Occurring early in the morning when he first gets up suggest that it could be a nasopharyngeal source.  He does not have any nosebleeds, but it could be something in the posterior nasopharynx.  I would think with a pulmonary or tracheal source the hemoptysis would occur at any given time.  Regardless of that he does have an abnormality in his trachea on the CT chest.  This  may just be secretions, but I think it needs to be investigated.  He needs a bronchoscopy with possible biopsy if there is a lesion there.   I discussed the procedure bronchoscopy with him.  He is familiar with the procedure having had it done before.  He understands there is potential for airway issues and hypoxia, bleeding, as well as possibility unforeseeable complications such as an MI or stroke.  He accepts the risks and wishes to proceed.  If bronchoscopy does not reveal a source I think referral for ENT evaluation would be the next step.  Plan: Bronchoscopy on Thursday, 11/01/2017  Melrose Nakayama, MD [facility] [phone]

## 2017-10-30 NOTE — Progress Notes (Signed)
[address]       [place] [address]             [phone]      HPI: Timothy Guerrero returns for follow-up  Timothy Guerrero is a 77 year old gentleman with a history of tobacco abuse and asbestos exposure.  He also has a history of type 2 diabetes without complication, hypertension, hyperlipidemia, melanoma, prostate cancer, postoperative atrial flutter, and sleep apnea.  I did a thoracoscopic right lower lobectomy for stage IA non-small cell carcinoma in June 2016.  A small area of the right upper lobe was taken en bloc with the initial resection to ensure an adequate margin, but there was no true invasion across the fissure.  I saw him in July 2017.  He was doing well at that time and wished to follow-up with Dr. Whitney Muse at [facility].  He also was followed by Dr. Ashok Cordia for his COPD.  He has been having problems with hemoptysis for the past year.  This is waxed and waned to some degree.  He was taken off of anticoagulation for presumed gastrointestinal bleed.  He continued to have the hemoptysis after that.  He says this almost always occurs in the morning after his first gotten up.  He does not see it later in the day.  His cough tends to be dry except in the mornings.  He had a bronchoscopy by Dr. Ashok Cordia in May 2018.  There were no bloody secretions and no airway abnormalities.  The bronchial stump was well-healed.  He has not had any follow-up scans since a CT in early 2018.  Overall he feels well.  He has not had any problems with appetite or weight loss.  He continues to have mild assist in the mornings.  His cough is dry later in the day.  He has not had any nosebleeds.  He gets short of breath with exertion.  That is stable.  He is not having any exertional chest pain, pressure, or tightness.  He sometimes feels a burning sensation in his right chest anterior to his incision he can last around 20 to 30 minutes.  It is not exertional in nature.  Zubrod Score: At the time  of surgery this patient's most appropriate activity status/level should be described as: []     0    Normal activity, no symptoms [x]     1    Restricted in physical strenuous activity but ambulatory, able to do out light work []     2    Ambulatory and capable of self care, unable to do work activities, up and about >50 % of waking hours                              []     3    Only limited self care, in bed greater than 50% of waking hours []     4    Completely disabled, no self care, confined to bed or chair []     5    Moribund  Current Outpatient Medications  Medication Sig Dispense Refill  . amLODipine (NORVASC) 10 MG tablet Take 1 tablet (10 mg total) by mouth daily. ((blood pressure)) 90 tablet 0  . diazepam (VALIUM) 5 MG tablet Take 1 tablet (5 mg total) by mouth every 6 (six) hours as needed for anxiety. 2 tablet 0  . diltiazem (CARDIZEM) 30 MG tablet Take 1 tablet  every 4 hours AS NEEDED for AFIB heart rate >100 45 tablet 1  . fluticasone (FLONASE) 50 MCG/ACT nasal spray Place 2 sprays into both nostrils daily. 16 g 6  . fluticasone furoate-vilanterol (BREO ELLIPTA) 200-25 MCG/INH AEPB Inhale 1 puff daily into the lungs. 3 each 3  . furosemide (LASIX) 20 MG tablet Take 1 tablet (20 mg total) by mouth daily. 90 tablet 3  . glipiZIDE (GLUCOTROL) 10 MG tablet Take 1 tablet (10 mg total) by mouth daily before breakfast. ((for blood sugar)) 90 tablet 3  . Insulin Glargine (LANTUS SOLOSTAR) 100 UNIT/ML Solostar Pen Inject 20-40 Units into the skin daily at 10 pm. 5 pen PRN  . irbesartan (AVAPRO) 300 MG tablet Take 1 tablet (300 mg total) by mouth daily. 90 tablet 3  . levothyroxine (SYNTHROID, LEVOTHROID) 50 MCG tablet Take 1 tablet (50 mcg total) by mouth daily before breakfast. ((for thyroid)) 90 tablet 3  . metFORMIN (GLUCOPHAGE) 1000 MG tablet Take 1 tablet (1,000 mg total) by mouth 2 (two) times daily with a meal. 180 tablet 0  . metoprolol (TOPROL-XL) 200 MG 24 hr tablet Take 1 tablet (200  mg total) by mouth daily. 90 tablet 3  . Multiple Vitamins-Minerals (CENTRUM SILVER 50+MEN) TABS Take 1 tablet by mouth daily.    . Potassium Chloride ER 20 MEQ TBCR Take 20 mEq by mouth daily. Take only on days when taking lasix 90 tablet 0  . Semaglutide (OZEMPIC) 0.25 or 0.5 MG/DOSE SOPN Inject 0.25 mg into the skin once a week. 9 mL 0  . valACYclovir (VALTREX) 1000 MG tablet Take 1,000 mg by mouth as needed (cold sores).     . pantoprazole (PROTONIX) 40 MG tablet Take 1 tablet (40 mg total) by mouth daily. 90 tablet 1   No current facility-administered medications for this visit.     Physical Exam BP 118/66 (BP Location: Right Arm, Patient Position: Sitting, Cuff Size: Large)   Pulse 100   Resp 16   Ht 6' (1.829 m)   Wt 201 lb (91.2 kg)   BMI 27.69 kg/m  77 year old man in no acute distress Alert and oriented x3 with no focal deficits, mild resting tremor No cervical or supra clavicular adenopathy Incisions well-healed Cardiac regular rate and rhythm normal S1 and S2 Lungs clear with equal breath sounds bilaterally No clubbing cyanosis or edema  Diagnostic Tests: CT CHEST WITHOUT CONTRAST  TECHNIQUE: Multidetector CT imaging of the chest was performed following the standard protocol without IV contrast.  COMPARISON:  None.  FINDINGS: Cardiovascular: Coronary artery calcification and aortic atherosclerotic calcification.  Mediastinum/Nodes: No axillary or supraclavicular adenopathy. No mediastinal hilar adenopathy. No pericardial effusion. Esophagus normal.  Lungs/Pleura: Lungs are hyperinflated. Centrilobular emphysema present. Several benign-appearing nodular calcifications over the RIGHT hemidiaphragm.  Surgical suture line in the RIGHT lower lobe with stable nodular thickening best seen on coronal imaging (image 101/6).  Within the trachea, there is non dependent thickening with nodularity along the ventral margin of the trachea approximately 6 cm  superior to the carina. (Image 39/series 10 and 4). Coronal series 6). Thickening to 4 mm.  Upper Abdomen: Limited view of the liver, kidneys, pancreas are unremarkable. Normal adrenal glands. Simple fluid attenuation lesion of the RIGHT kidney. Nodular thickening in the RIGHT adrenal gland to 15 mm. Unchanged from prior.  Musculoskeletal: No aggressive osseous lesion  IMPRESSION: 1. Nodular thickening along the nondependent surface of the mid trachea. Differential including neoplasm versus adherent secretions. Stable postsurgical change in the RIGHT  lower lobe without suspicious nodularity. 2. RIGHT lower lobectomy without evidence of recurrence along the suture line. Stable thickening compared to CT 09/01/2016 3. Hyperinflated lungs consistent with emphysema.  These results will be called to the ordering clinician or representative by the Radiologist Assistant, and communication documented in the PACS or zVision Dashboard.   Electronically Signed   By: Suzy Bouchard M.D.   On: 10/30/2017 14:27 I personally reviewed the CT images and concur with the findings noted above.  Impression: Timothy Guerrero is a 77 year old gentleman with a history of tobacco abuse and asbestos exposure.  He had a right lower lobectomy for a stage Ia non-small cell carcinoma back in June 2016.  He is now 3 years out with no evidence of recurrent disease on CT.  Hemoptysis-he continues to have hemoptysis which he says almost always occurs in the mornings.  He coughs frequently throughout the day but does not see blood at other times.  Occurring early in the morning when he first gets up suggest that it could be a nasopharyngeal source.  He does not have any nosebleeds, but it could be something in the posterior nasopharynx.  I would think with a pulmonary or tracheal source the hemoptysis would occur at any given time.  Regardless of that he does have an abnormality in his trachea on the CT chest.  This  may just be secretions, but I think it needs to be investigated.  He needs a bronchoscopy with possible biopsy if there is a lesion there.   I discussed the procedure bronchoscopy with him.  He is familiar with the procedure having had it done before.  He understands there is potential for airway issues and hypoxia, bleeding, as well as possibility unforeseeable complications such as an MI or stroke.  He accepts the risks and wishes to proceed.  If bronchoscopy does not reveal a source I think referral for ENT evaluation would be the next step.  Plan: Bronchoscopy on Thursday, 11/01/2017  Melrose Nakayama, MD [facility] [phone]

## 2017-10-31 ENCOUNTER — Other Ambulatory Visit: Payer: Self-pay

## 2017-10-31 ENCOUNTER — Other Ambulatory Visit: Payer: Self-pay | Admitting: Family Medicine

## 2017-10-31 ENCOUNTER — Encounter (HOSPITAL_COMMUNITY): Payer: Self-pay | Admitting: *Deleted

## 2017-10-31 NOTE — Progress Notes (Signed)
Anesthesia Chart Review:  Pt is a same day work up   Case:  [id number] Date/Time:  11/01/17 1048   Procedure:  VIDEO BRONCHOSCOPY (N/A )   Anesthesia type:  Monitor Anesthesia Care   Pre-op diagnosis:  HEMOPTYSIS   Location:  [facility] OR ROOM 10 / [facility]   Surgeon:  Melrose Nakayama, MD      DISCUSSION:  - Pt is a 77 year old male with hx chronic systolic HF (EF 09-73% in 2017) and post-op atrial flutter.  - Last office visit with cardiologist Dr. Percival Spanish 06/20/17 documents clearance for a shoulder surgery (I do not see this surgery has happened) at moderate risk for cardiovascular complications noting "he had no active ischemia on stress perfusion last year.  He has a high functional level.  He has no acute symptoms.  No further cardiovascular testing is suggested"    PROVIDERS: PCP is [name], MD   Patient Care Team: Daneil Dolin, MD as Consulting Physician (Gastroenterology) Martinique, Amy, MD as Consulting Physician (Dermatology) Minus Breeding, MD as Consulting Physician (Cardiology) Javier Glazier, MD as Consulting Physician (Pulmonary Disease)  EP cardiologist is Thompson Grayer, MD. Last office visit 08/06/17 for hx post-op atrial flutter.  Offered ablation, pt declined.  Prn f/u recommended.     LABS: Will be obtained day of surgery - HbA1c was 9.1 on 10/16/17   IMAGES:  CXR will be obtained day of surgery  CT chest 10/30/17:  1. Nodular thickening along the nondependent surface of the mid trachea. Differential including neoplasm versus adherent secretions. Stable postsurgical change in the RIGHT lower lobe without suspicious nodularity. 2. RIGHT lower lobectomy without evidence of recurrence along the suture line. Stable thickening compared to CT 09/01/2016 3. Hyperinflated lungs consistent with emphysema.   EKG 08/06/17: NSR.  Nonspecific T wave abnormality.   CV:  Nuclear stress test 06/29/16:   The left ventricular ejection fraction is moderately  decreased (30-44%).  Nuclear stress EF: 35%.  There was no ST segment deviation noted during stress.  This is an intermediate risk study. 1. EF 35%, diffuse hypokinesis.  2. Fixed medium-sized, moderate basal to mid inferior perfusion defect.  Possible prior infarction.  No significant ischemia.  3. Intermediate risk study.   Echo 06/06/16:  - Left ventricle: The cavity size was normal. Wall thickness was increased in a pattern of mild LVH. Systolic function was severely reduced. The estimated ejection fraction was in the range of 25% to 30%. Diffuse hypokinesis. Features are consistent with a pseudonormal left ventricular filling pattern, with concomitant abnormal relaxation and increased filling pressure (grade 2 diastolic dysfunction). Doppler parameters are consistent with high ventricular filling pressure. - Mitral valve: Calcified annulus. There was mild to moderate regurgitation. - Left atrium: The atrium was moderately dilated. - Pulmonary arteries: Systolic pressure was moderately increased. - Impressions: Severe global reduction in LV systolic function; grade 2 diastolic dysfunction with elevated LV filling pressure; moderate LAE; mild to moderate MR; trace TR with moderately elevated pulmonary pressure.  Cardiac cath 01/09/12:  - Normal heart  Past Medical History:  Diagnosis Date  . Anemia   . Arthritis   . Atrial flutter ([place])    post op following lobectomy  . Benign essential tremor   . Blood transfusion without reported diagnosis   . Cataract   . CHF (congestive heart failure) ([place])   . Colon polyp    2007, polyp with early cancer, one year f/u no residual polyp. next TCS 2010,  see PSH. [facility].  [name] COPD (chronic obstructive pulmonary disease) ([place])   . Diabetes mellitus without complication ([place])    10 years  . GERD (gastroesophageal reflux disease)   . HOH (hard of hearing)   . Hypercholesteremia   . Hypertension    10 years  . Lung cancer  ([place])   . Prostate cancer ([place])   . Shortness of breath dyspnea    due to lung mass  . Skin melanoma ([place])   . Sleep apnea    wears CPAP sometimes, cannot tolerate all the time.    Past Surgical History:  Procedure Laterality Date  . bottom lobe of right lung removed  12/17/14  . CARDIAC CATHETERIZATION     no PCI  . CATARACT EXTRACTION Left   . CATARACT EXTRACTION W/PHACO Left 01/03/2016   Procedure: CATARACT EXTRACTION PHACO AND INTRAOCULAR LENS PLACEMENT LEFT EYE CDE=7.78;  Surgeon: Williams Che, MD;  Location: [facility];  Service: Ophthalmology;  Laterality: Left;  . CATARACT EXTRACTION W/PHACO Right 03/20/2016   Procedure: CATARACT EXTRACTION PHACO AND INTRAOCULAR LENS PLACEMENT; CDE:  9.58;  Surgeon: Williams Che, MD;  Location: [facility];  Service: Ophthalmology;  Laterality: Right;  . COLONOSCOPY  08/2008   Dr. Leafy Half, [place]: normal, internal grade 1 hemorrhoids. next TCS 08/2013  . COLONOSCOPY N/A 03/04/2014   Procedure: COLONOSCOPY;  Surgeon: Daneil Dolin, MD;  Location: [facility];  Service: Endoscopy;  Laterality: N/A;  7:30am  . EYE SURGERY    . knee replacements     bilateral. over 10 years ago  . LOBECTOMY Right 12/17/2014   Procedure: RIGHT LOWER LOBECTOMY;  Surgeon: Melrose Nakayama, MD;  Location: [facility];  Service: Thoracic;  Laterality: Right;  . LYMPH NODE DISSECTION Right 12/17/2014   Procedure: LYMPH NODE DISSECTION;  Surgeon: Melrose Nakayama, MD;  Location: [facility];  Service: Thoracic;  Laterality: Right;  . NECK SURGERY     melanoma removed.  [name] PROSTATE SURGERY     prostatectomy, radical  . TOTAL HIP ARTHROPLASTY  2007   left  . total shoulder replacement  2011   left  . VIDEO ASSISTED THORACOSCOPY (VATS)/WEDGE RESECTION Right 12/17/2014   Procedure: RIGHT VIDEO ASSISTED THORACOSCOPY (VATS);  Surgeon: Melrose Nakayama, MD;  Location: [facility];  Service: Thoracic;  Laterality: Right;  [name] VIDEO BRONCHOSCOPY Bilateral  11/17/2016   Procedure: VIDEO BRONCHOSCOPY WITHOUT FLUORO;  Surgeon: Javier Glazier, MD;  Location: [facility];  Service: Cardiopulmonary;  Laterality: Bilateral;    MEDICATIONS: No current facility-administered medications for this encounter.    [name] amLODipine (NORVASC) 10 MG tablet  . capsaicin (ZOSTRIX) 0.025 % cream  . diltiazem (CARDIZEM) 30 MG tablet  . fluticasone (FLONASE) 50 MCG/ACT nasal spray  . fluticasone furoate-vilanterol (BREO ELLIPTA) 200-25 MCG/INH AEPB  . furosemide (LASIX) 20 MG tablet  . glipiZIDE (GLUCOTROL) 10 MG tablet  . Insulin Glargine (LANTUS SOLOSTAR) 100 UNIT/ML Solostar Pen  . irbesartan (AVAPRO) 300 MG tablet  . levothyroxine (SYNTHROID, LEVOTHROID) 50 MCG tablet  . magnesium oxide (MAG-OX) 400 MG tablet  . metFORMIN (GLUCOPHAGE) 1000 MG tablet  . metoprolol (TOPROL-XL) 200 MG 24 hr tablet  . pantoprazole (PROTONIX) 40 MG tablet  . Potassium Chloride ER 20 MEQ TBCR  . Semaglutide (OZEMPIC) 0.25 or 0.5 MG/DOSE SOPN  . valACYclovir (VALTREX) 1000 MG tablet    If labs acceptable day of surgery, I anticipate pt can proceed with surgery as  scheduled.  Willeen Cass, FNP-BC [facility]/Anesthesiology Phone: [phone] 10/31/2017 2:46 PM

## 2017-10-31 NOTE — Telephone Encounter (Signed)
Last seen 4/19

## 2017-10-31 NOTE — Progress Notes (Signed)
Spoke with pt for pre-op call. Pt has hx of A-flutter after surgery. Sees Dr. Percival Spanish, last office visit was in December, 2018. Pt has also seen Dr. Rayann Heman in February 2019 about a possible ablation, but pt has chosen not to do the ablation at this time. Pt is a type 2 diabetic. Last A1C was 10.4 on 07/18/17. Pt states his fasting blood sugar in usually in the 120's. Instructed pt to take 1/2 of his regular dose of Lantus Insulin Thursday AM (will take 15 units. Pt instructed to check his blood sugar when he gets up and every 2 hours until he leaves for the hospital. If blood sugar is 70 or below, treat with 1/2 cup of clear juice (apple or cranberry) and recheck blood sugar 15 minutes after drinking juice. If blood sugar continues to be 70 or below, call the Short Stay department and ask to speak to a nurse. Pt voiced understanding.

## 2017-11-01 ENCOUNTER — Encounter (HOSPITAL_COMMUNITY): Payer: Self-pay | Admitting: Urology

## 2017-11-01 ENCOUNTER — Other Ambulatory Visit: Payer: Self-pay | Admitting: Family Medicine

## 2017-11-01 ENCOUNTER — Ambulatory Visit (HOSPITAL_COMMUNITY): Payer: Medicare HMO | Admitting: Emergency Medicine

## 2017-11-01 ENCOUNTER — Ambulatory Visit (HOSPITAL_COMMUNITY)
Admission: RE | Admit: 2017-11-01 | Discharge: 2017-11-01 | Disposition: A | Discharge status: Home / Self Care | Payer: Medicare HMO | Source: Ambulatory Visit | Attending: Thoracic Surgery (Cardiothoracic Vascular Surgery) | Admitting: Thoracic Surgery (Cardiothoracic Vascular Surgery)

## 2017-11-01 ENCOUNTER — Ambulatory Visit (HOSPITAL_COMMUNITY): Payer: Medicare HMO

## 2017-11-01 ENCOUNTER — Encounter (HOSPITAL_COMMUNITY)
Admission: RE | Disposition: A | Discharge status: Home / Self Care | Payer: Self-pay | Source: Ambulatory Visit | Attending: Thoracic Surgery (Cardiothoracic Vascular Surgery)

## 2017-11-01 DIAGNOSIS — R042 Hemoptysis: Secondary | ICD-10-CM | POA: Insufficient documentation

## 2017-11-01 DIAGNOSIS — Z79890 Hormone replacement therapy (postmenopausal): Secondary | ICD-10-CM | POA: Diagnosis not present

## 2017-11-01 DIAGNOSIS — K219 Gastro-esophageal reflux disease without esophagitis: Secondary | ICD-10-CM | POA: Diagnosis not present

## 2017-11-01 DIAGNOSIS — Z7951 Long term (current) use of inhaled steroids: Secondary | ICD-10-CM | POA: Diagnosis not present

## 2017-11-01 DIAGNOSIS — Z794 Long term (current) use of insulin: Secondary | ICD-10-CM | POA: Diagnosis not present

## 2017-11-01 DIAGNOSIS — E119 Type 2 diabetes mellitus without complications: Secondary | ICD-10-CM | POA: Insufficient documentation

## 2017-11-01 DIAGNOSIS — D491 Neoplasm of unspecified behavior of respiratory system: Secondary | ICD-10-CM | POA: Diagnosis not present

## 2017-11-01 DIAGNOSIS — R846 Abnormal cytological findings in specimens from respiratory organs and thorax: Secondary | ICD-10-CM | POA: Diagnosis not present

## 2017-11-01 DIAGNOSIS — Z85820 Personal history of malignant melanoma of skin: Secondary | ICD-10-CM | POA: Diagnosis not present

## 2017-11-01 DIAGNOSIS — G4730 Sleep apnea, unspecified: Secondary | ICD-10-CM | POA: Diagnosis not present

## 2017-11-01 DIAGNOSIS — I10 Essential (primary) hypertension: Secondary | ICD-10-CM | POA: Diagnosis not present

## 2017-11-01 DIAGNOSIS — Z85118 Personal history of other malignant neoplasm of bronchus and lung: Secondary | ICD-10-CM | POA: Diagnosis not present

## 2017-11-01 DIAGNOSIS — Z87891 Personal history of nicotine dependence: Secondary | ICD-10-CM | POA: Diagnosis not present

## 2017-11-01 DIAGNOSIS — I110 Hypertensive heart disease with heart failure: Secondary | ICD-10-CM | POA: Diagnosis not present

## 2017-11-01 DIAGNOSIS — C33 Malignant neoplasm of trachea: Secondary | ICD-10-CM | POA: Diagnosis not present

## 2017-11-01 DIAGNOSIS — I5022 Chronic systolic (congestive) heart failure: Secondary | ICD-10-CM | POA: Diagnosis not present

## 2017-11-01 DIAGNOSIS — J449 Chronic obstructive pulmonary disease, unspecified: Secondary | ICD-10-CM | POA: Insufficient documentation

## 2017-11-01 DIAGNOSIS — Z8546 Personal history of malignant neoplasm of prostate: Secondary | ICD-10-CM | POA: Diagnosis not present

## 2017-11-01 DIAGNOSIS — Z902 Acquired absence of lung [part of]: Secondary | ICD-10-CM | POA: Insufficient documentation

## 2017-11-01 DIAGNOSIS — E785 Hyperlipidemia, unspecified: Secondary | ICD-10-CM | POA: Diagnosis not present

## 2017-11-01 DIAGNOSIS — I700 Atherosclerosis of aorta: Secondary | ICD-10-CM | POA: Diagnosis not present

## 2017-11-01 DIAGNOSIS — Z79899 Other long term (current) drug therapy: Secondary | ICD-10-CM | POA: Insufficient documentation

## 2017-11-01 HISTORY — PX: VIDEO BRONCHOSCOPY: SHX5072

## 2017-11-01 LAB — COMPREHENSIVE METABOLIC PANEL
ALT: 13 U/L — ABNORMAL LOW (ref 17–63)
AST: 17 U/L (ref 15–41)
Albumin: 3.6 g/dL (ref 3.5–5.0)
Alkaline Phosphatase: 64 U/L (ref 38–126)
Anion gap: 9 (ref 5–15)
BUN: 9 mg/dL (ref 6–20)
CO2: 27 mmol/L (ref 22–32)
Calcium: 9.5 mg/dL (ref 8.9–10.3)
Chloride: 106 mmol/L (ref 101–111)
Creatinine, Ser: 0.94 mg/dL (ref 0.61–1.24)
GFR calc Af Amer: >60 mL/min (ref >60)
GFR calc non Af Amer: >60 mL/min (ref >60)
Glucose, Bld: 194 mg/dL — ABNORMAL HIGH (ref 65–99)
Potassium: 4.6 mmol/L (ref 3.5–5.1)
Sodium: 142 mmol/L (ref 135–145)
Total Bilirubin: 0.3 mg/dL (ref 0.3–1.2)
Total Protein: 6.5 g/dL (ref 6.5–8.1)

## 2017-11-01 LAB — APTT: aPTT: 31 seconds (ref 24–36)

## 2017-11-01 LAB — CBC
HCT: 44.5 % (ref 39.0–52.0)
Hemoglobin: 14.6 g/dL (ref 13.0–17.0)
MCH: 30.2 pg (ref 26.0–34.0)
MCHC: 32.8 g/dL (ref 30.0–36.0)
MCV: 91.9 fL (ref 78.0–100.0)
Platelets: 258 10*3/uL (ref 150–400)
RBC: 4.84 MIL/uL (ref 4.22–5.81)
RDW: 14.6 % (ref 11.5–15.5)
WBC: 7.6 10*3/uL (ref 4.0–10.5)

## 2017-11-01 LAB — GLUCOSE, CAPILLARY
Glucose-Capillary: 175 mg/dL — ABNORMAL HIGH (ref 65–99)
Glucose-Capillary: 141 mg/dL — ABNORMAL HIGH (ref 65–99)
Glucose-Capillary: 145 mg/dL — ABNORMAL HIGH (ref 65–99)

## 2017-11-01 LAB — PROTIME-INR
INR: 1
Prothrombin Time: 13.1 seconds (ref 11.4–15.2)

## 2017-11-01 SURGERY — BRONCHOSCOPY, VIDEO-ASSISTED
Anesthesia: Monitor Anesthesia Care | Site: Mouth

## 2017-11-01 MED ORDER — FENTANYL CITRATE (PF) 250 MCG/5ML IJ SOLN
INTRAMUSCULAR | Status: AC
  Filled 2017-11-01: qty 5

## 2017-11-01 MED ORDER — BUTAMBEN-TETRACAINE-BENZOCAINE 2-2-14 % EX AERO
INHALATION_SPRAY | CUTANEOUS | Status: DC | PRN
  Administered 2017-11-01: 1 via TOPICAL

## 2017-11-01 MED ORDER — LIDOCAINE HCL (CARDIAC) PF 100 MG/5ML IV SOSY
PREFILLED_SYRINGE | INTRAVENOUS | Status: DC | PRN
  Administered 2017-11-01: 40 mg via INTRAVENOUS
  Administered 2017-11-01: 60 mg via INTRAVENOUS

## 2017-11-01 MED ORDER — DEXAMETHASONE SODIUM PHOSPHATE 10 MG/ML IJ SOLN
INTRAMUSCULAR | Status: AC
  Filled 2017-11-01: qty 1

## 2017-11-01 MED ORDER — DEXMEDETOMIDINE HCL IN NACL 200 MCG/50ML IV SOLN
INTRAVENOUS | Status: AC
  Filled 2017-11-01: qty 50

## 2017-11-01 MED ORDER — PROPOFOL 10 MG/ML IV BOLUS
INTRAVENOUS | Status: DC | PRN
  Administered 2017-11-01: 40 mg via INTRAVENOUS
  Administered 2017-11-01: 20 mg via INTRAVENOUS
  Administered 2017-11-01: 40 mg via INTRAVENOUS
  Administered 2017-11-01: 10 mg via INTRAVENOUS
  Administered 2017-11-01 (×2): 20 mg via INTRAVENOUS
  Administered 2017-11-01: 10 mg via INTRAVENOUS

## 2017-11-01 MED ORDER — LACTATED RINGERS IV SOLN
INTRAVENOUS | Status: DC
  Administered 2017-11-01 (×2): via INTRAVENOUS

## 2017-11-01 MED ORDER — DEXAMETHASONE SODIUM PHOSPHATE 10 MG/ML IJ SOLN
INTRAMUSCULAR | Status: DC | PRN
  Administered 2017-11-01: 5 mg via INTRAVENOUS

## 2017-11-01 MED ORDER — OXYMETAZOLINE HCL 0.05 % NA SOLN
NASAL | Status: DC | PRN
  Administered 2017-11-01: 2 via NASAL

## 2017-11-01 MED ORDER — LIDOCAINE HCL 2 % IJ SOLN
INTRAMUSCULAR | Status: DC | PRN
  Administered 2017-11-01: 10 mL

## 2017-11-01 MED ORDER — 0.9 % SODIUM CHLORIDE (POUR BTL) OPTIME
TOPICAL | Status: DC | PRN
  Administered 2017-11-01: 1000 mL

## 2017-11-01 MED ORDER — DEXMEDETOMIDINE HCL IN NACL 400 MCG/100ML IV SOLN
INTRAVENOUS | Status: DC | PRN
  Administered 2017-11-01: 1 ug/kg/h via INTRAVENOUS

## 2017-11-01 MED ORDER — PROPOFOL 1000 MG/100ML IV EMUL
INTRAVENOUS | Status: AC
  Filled 2017-11-01: qty 100

## 2017-11-01 MED ORDER — DEXMEDETOMIDINE HCL 200 MCG/2ML IV SOLN
INTRAVENOUS | Status: DC | PRN
  Administered 2017-11-01: 8 ug via INTRAVENOUS

## 2017-11-01 MED ORDER — LIDOCAINE HCL (PF) 1 % IJ SOLN
INTRAMUSCULAR | Status: AC
  Filled 2017-11-01: qty 30

## 2017-11-01 MED ORDER — PROPOFOL 10 MG/ML IV BOLUS
INTRAVENOUS | Status: AC
  Filled 2017-11-01: qty 20

## 2017-11-01 MED ORDER — OXYMETAZOLINE HCL 0.05 % NA SOLN
NASAL | Status: AC
  Filled 2017-11-01: qty 15

## 2017-11-01 MED ORDER — PHENYLEPHRINE HCL 10 MG/ML IJ SOLN
INTRAMUSCULAR | Status: DC | PRN
  Administered 2017-11-01: 80 ug via INTRAVENOUS
  Administered 2017-11-01: 40 ug via INTRAVENOUS

## 2017-11-01 MED ORDER — FENTANYL CITRATE (PF) 100 MCG/2ML IJ SOLN
INTRAMUSCULAR | Status: DC | PRN
  Administered 2017-11-01: 50 ug via INTRAVENOUS
  Administered 2017-11-01: 25 ug via INTRAVENOUS

## 2017-11-01 SURGICAL SUPPLY — 28 items
BRUSH CYTOL CELLEBRITY 1.5X140 (MISCELLANEOUS) IMPLANT
CANISTER SUCT 3000ML PPV (MISCELLANEOUS) ×2 IMPLANT
CONT SPEC 4OZ CLIKSEAL STRL BL (MISCELLANEOUS) ×4 IMPLANT
COVER BACK TABLE 60X90IN (DRAPES) ×2 IMPLANT
FILTER STRAW FLUID ASPIR (MISCELLANEOUS) IMPLANT
FORCEPS BIOP RJ4 1.8 (CUTTING FORCEPS) IMPLANT
FORCEPS RADIAL JAW LRG 4 PULM (INSTRUMENTS) IMPLANT
GAUZE SPONGE 4X4 12PLY STRL (GAUZE/BANDAGES/DRESSINGS) ×2 IMPLANT
GLOVE SURG SIGNA 7.5 PF LTX (GLOVE) ×2 IMPLANT
GOWN STRL REUS W/ TWL LRG LVL3 (GOWN DISPOSABLE) ×1 IMPLANT
GOWN STRL REUS W/ TWL XL LVL3 (GOWN DISPOSABLE) ×1 IMPLANT
GOWN STRL REUS W/TWL LRG LVL3 (GOWN DISPOSABLE) ×2
GOWN STRL REUS W/TWL XL LVL3 (GOWN DISPOSABLE) ×2
KIT CLEAN ENDO COMPLIANCE (KITS) ×2 IMPLANT
KIT TURNOVER KIT B (KITS) ×2 IMPLANT
MARKER SKIN DUAL TIP RULER LAB (MISCELLANEOUS) ×2 IMPLANT
NS IRRIG 1000ML POUR BTL (IV SOLUTION) ×2 IMPLANT
OIL SILICONE PENTAX (PARTS (SERVICE/REPAIRS)) ×2 IMPLANT
PAD ARMBOARD 7.5X6 YLW CONV (MISCELLANEOUS) ×4 IMPLANT
RADIAL JAW LRG 4 PULMONARY (INSTRUMENTS)
SYR 20ML ECCENTRIC (SYRINGE) ×4 IMPLANT
SYR 5ML LL (SYRINGE) ×2 IMPLANT
SYR 5ML LUER SLIP (SYRINGE) ×2 IMPLANT
TOWEL GREEN STERILE (TOWEL DISPOSABLE) ×2 IMPLANT
TOWEL GREEN STERILE FF (TOWEL DISPOSABLE) ×2 IMPLANT
TRAP SPECIMEN MUCOUS 40CC (MISCELLANEOUS) ×2 IMPLANT
TUBE CONNECTING 20X1/4 (TUBING) ×2 IMPLANT
VALVE DISPOSABLE (MISCELLANEOUS) ×2 IMPLANT

## 2017-11-01 NOTE — Transfer of Care (Signed)
Immediate Anesthesia Transfer of Care Note  Patient: Timothy Guerrero  Procedure(s) Performed: VIDEO BRONCHOSCOPY (N/A Mouth)  Patient Location: PACU  Anesthesia Type:MAC  Level of Consciousness: awake, alert  and sedated  Airway & Oxygen Therapy: Patient connected to nasal cannula oxygen  Post-op Assessment: Post -op Vital signs reviewed and stable  Post vital signs: stable  Last Vitals:  Vitals Value Taken Time  BP    Temp    Pulse 76 11/01/2017  1:13 PM  Resp    SpO2 99 % 11/01/2017  1:13 PM  Vitals shown include unvalidated device data.  Last Pain:  Vitals:   11/01/17 0845  TempSrc:   PainSc: 0-No pain      Patients Stated Pain Goal: 0 (37/54/36 0677)  Complications: No apparent anesthesia complications

## 2017-11-01 NOTE — Interval H&P Note (Signed)
History and Physical Interval Note:  11/01/2017 11:30 AM  Timothy Guerrero  has presented today for surgery, with the diagnosis of HEMOPTYSIS  The various methods of treatment have been discussed with the patient and family. After consideration of risks, benefits and other options for treatment, the patient has consented to  Procedure(s): VIDEO BRONCHOSCOPY (N/A) as a surgical intervention .  The patient's history has been reviewed, patient examined, no change in status, stable for surgery.  I have reviewed the patient's chart and labs.  Questions were answered to the patient's satisfaction.     Melrose Nakayama

## 2017-11-01 NOTE — Brief Op Note (Signed)
11/01/2017  1:28 PM  PATIENT:  [name]  77 y.o. male  PRE-OPERATIVE DIAGNOSIS:  HEMOPTYSIS  POST-OPERATIVE DIAGNOSIS:  TRACHEAL TUMOR   PROCEDURE:  Procedure(s): VIDEO BRONCHOSCOPY (N/A)  SURGEON:  Surgeon(s) and Role:    * Melrose Nakayama, MD - Primary  PHYSICIAN ASSISTANT:   ASSISTANTS: none   ANESTHESIA:   IV sedation  EBL: minimal  BLOOD ADMINISTERED:none  DRAINS: none   LOCAL MEDICATIONS USED:  LIDOCAINE  and Amount: 10 ml  SPECIMEN:  Source of Specimen:  washings  DISPOSITION OF SPECIMEN:  PATHOLOGY  COUNTS:  NO endoscopic  TOURNIQUET:  * No tourniquets in log *  DICTATION: .Other Dictation: Dictation Number -  PLAN OF CARE: Discharge to home after PACU  PATIENT DISPOSITION:  PACU - hemodynamically stable.   Delay start of Pharmacological VTE agent (>24hrs) due to surgical blood loss or risk of bleeding: not applicable  Findings: tumor anterior mid thoracic trachea with cystic component, 9 cm below cords

## 2017-11-01 NOTE — Anesthesia Preprocedure Evaluation (Addendum)
Anesthesia Evaluation  Patient identified by MRN, date of birth, ID band Patient awake    Reviewed: Allergy & Precautions, NPO status , Patient's Chart, lab work & pertinent test results, reviewed documented beta blocker date and time   History of Anesthesia Complications Negative for: history of anesthetic complications  Airway Mallampati: II  TM Distance: >3 FB Neck ROM: Full    Dental  (+) Chipped, Missing, Dental Advisory Given   Pulmonary sleep apnea and Continuous Positive Airway Pressure Ventilation , COPD, former smoker,    breath sounds clear to auscultation       Cardiovascular hypertension, Pt. on medications and Pt. on home beta blockers (-) angina+CHF  + dysrhythmias (controlled) Atrial Fibrillation  Rhythm:Regular Rate:Normal  '17 Stress: EF 30-44%, Nuclear stress EF: 35%. no ST segment deviation noted during stress, diffuse hypokinesis, Fixed medium-sized, moderate basal to mid inferior perfusion defect.  Possible prior infarction.  No significant ischemia.  '17 ECHO: Severe global reduction in LV systolic function, EF 89-21%, grade 2 diastolic dysfunction with elevated LV filling pressure; moderate LAE; mild to moderate MR; trace TR with moderately elevated pulmonary pressure.    Neuro/Psych negative neurological ROS     GI/Hepatic Neg liver ROS, GERD  Medicated and Controlled,  Endo/Other  diabetes (glu 141), Oral Hypoglycemic Agents, Insulin DependentHypothyroidism   Renal/GU negative Renal ROS     Musculoskeletal  (+) Arthritis ,   Abdominal   Peds  Hematology negative hematology ROS (+)   Anesthesia Other Findings   Reproductive/Obstetrics                            Anesthesia Physical Anesthesia Plan  ASA: III  Anesthesia Plan: MAC   Post-op Pain Management:    Induction:   PONV Risk Score and Plan: 1 and Ondansetron  Airway Management Planned: Natural Airway  and Nasal Cannula  Additional Equipment:   Intra-op Plan:   Post-operative Plan:   Informed Consent: I have reviewed the patients History and Physical, chart, labs and discussed the procedure including the risks, benefits and alternatives for the proposed anesthesia with the patient or authorized representative who has indicated his/her understanding and acceptance.   Dental advisory given  Plan Discussed with: CRNA and Surgeon  Anesthesia Plan Comments: (Plan routine monitors, MAC)        Anesthesia Quick Evaluation

## 2017-11-01 NOTE — Anesthesia Postprocedure Evaluation (Signed)
Anesthesia Post Note  Patient: Timothy Guerrero  Procedure(s) Performed: VIDEO BRONCHOSCOPY (N/A Mouth)     Patient location during evaluation: PACU Anesthesia Type: MAC Level of consciousness: awake and alert, oriented and patient cooperative Pain management: pain level controlled Vital Signs Assessment: post-procedure vital signs reviewed and stable Respiratory status: spontaneous breathing, nonlabored ventilation and respiratory function stable Cardiovascular status: blood pressure returned to baseline and stable Postop Assessment: no apparent nausea or vomiting Anesthetic complications: no    Last Vitals:  Vitals:   11/01/17 1335 11/01/17 1336  BP: 110/77   Pulse:    Resp:    Temp:  (!) 36.3 C  SpO2:      Last Pain:  Vitals:   11/01/17 0845  TempSrc:   PainSc: 0-No pain                 Taeya Theall,E. Zadok Holaway

## 2017-11-02 ENCOUNTER — Encounter (HOSPITAL_COMMUNITY): Payer: Self-pay | Admitting: Thoracic Surgery (Cardiothoracic Vascular Surgery)

## 2017-11-02 NOTE — Telephone Encounter (Signed)
Last seen 10/16/17

## 2017-11-02 NOTE — Op Note (Signed)
NAME: SYDNEY, AZURE. MEDICAL RECORD [id number] ACCOUNT [id number] DATE OF BIRTH:[birth year] FACILITY: [facility] LOCATION: [facility] PHYSICIAN:STEVEN Chaya Jan, MD  OPERATIVE REPORT  DATE OF PROCEDURE:  11/01/2017  PREOPERATIVE DIAGNOSIS:  Hemoptysis.  POSTOPERATIVE DIAGNOSIS:  Tracheal tumor.  PROCEDURE:  Video bronchoscopy.  SURGEON:  Modesto Charon, MD  ASSISTANT:  None.  ANESTHESIA:  IV sedation.  BLOOD LOSS:  Minimal.  FINDINGS:  Approximately 2 x 2 cm mass involving the anterior portion of the mid trachea 9 cm below the cord with a central cystic component.  Bronchial stump was well healed.  No other endobronchial lesions noted.  CLINICAL NOTE:  Mr. Kamel is a 77 year old gentleman who had a right lower lobectomy for stage IA lung cancer in June 2016.  For the past year, he has been having hemoptysis.  He had bronchoscopy last May, which was nondiagnostic.  His hemoptysis  persisted, and he called my office and scheduled an appointment.  A CT of the chest was done.  There was nodular thickening in the anterior mid trachea with a question of whether this was a neoplasm versus adherent secretions.  He was advised to undergo  bronchoscopy for diagnostic purposes.  The indications, risks, benefits, and alternatives were discussed in detail with the patient.  He understood and accepted the risks and agreed to proceed.  OPERATIVE NOTE:  Mr. Lampert was brought to the operating room on 11/01/2017.  He was given intravenous sedation and monitored by the Anesthesia service.  After performing a timeout, flexible fiberoptic bronchoscopy was performed.  A bite block was placed,  and the bronchoscope was advanced through the oropharynx.  The cords were visualized.  Five mL of lidocaine was injected onto the cords through the scope.  After waiting several minutes, the scope then was replaced and advanced through the cords.  There  was significant coughing when this was done.  An  additional 5 mL of lidocaine administered into the trachea.  Inspection showed no significant blood within the tracheobronchial tree.  The right lower lobe stump was well healed.  There were no  other lesions seen distally.  As the scope was withdrawn, there was a mass seen anteriorly in the mid trachea 9 cm below the cords.  There was a central cystic component.  The mass was approximately 2 x 2 cm in total with a central cystic component being  several millimeters in size.  Because of the history of hemoptysis and with no control of the airway, the decision was made not to biopsy the mass.  Washings were obtained and sent for cytology.  The plan will be to re-bronchoscope the patient under general anesthesia with airway control to  biopsy the mass.  The bronchoscope was withdrawn.  The patient was taken from the operating room to the [place] [facility] in good condition.  LN/NUANCE  D:11/01/2017 T:11/01/2017 JOB:[id number]

## 2017-11-05 ENCOUNTER — Ambulatory Visit: Payer: Medicare HMO | Admitting: Thoracic Surgery (Cardiothoracic Vascular Surgery)

## 2017-11-06 ENCOUNTER — Other Ambulatory Visit: Payer: Self-pay

## 2017-11-06 ENCOUNTER — Other Ambulatory Visit: Payer: Self-pay | Admitting: *Deleted

## 2017-11-06 ENCOUNTER — Ambulatory Visit: Payer: Medicare HMO | Admitting: Thoracic Surgery (Cardiothoracic Vascular Surgery)

## 2017-11-06 VITALS — BP 161/80 | HR 74 | Resp 16 | Ht 72.0 in | Wt 201.0 lb

## 2017-11-06 DIAGNOSIS — R042 Hemoptysis: Secondary | ICD-10-CM

## 2017-11-06 DIAGNOSIS — L820 Inflamed seborrheic keratosis: Secondary | ICD-10-CM | POA: Diagnosis not present

## 2017-11-06 DIAGNOSIS — D1801 Hemangioma of skin and subcutaneous tissue: Secondary | ICD-10-CM | POA: Diagnosis not present

## 2017-11-06 DIAGNOSIS — Z85118 Personal history of other malignant neoplasm of bronchus and lung: Secondary | ICD-10-CM | POA: Diagnosis not present

## 2017-11-06 DIAGNOSIS — L570 Actinic keratosis: Secondary | ICD-10-CM | POA: Diagnosis not present

## 2017-11-06 DIAGNOSIS — D485 Neoplasm of uncertain behavior of skin: Secondary | ICD-10-CM | POA: Diagnosis not present

## 2017-11-06 DIAGNOSIS — D381 Neoplasm of uncertain behavior of trachea, bronchus and lung: Secondary | ICD-10-CM

## 2017-11-06 DIAGNOSIS — Z01818 Encounter for other preprocedural examination: Secondary | ICD-10-CM

## 2017-11-06 DIAGNOSIS — L821 Other seborrheic keratosis: Secondary | ICD-10-CM | POA: Diagnosis not present

## 2017-11-06 DIAGNOSIS — Z902 Acquired absence of lung [part of]: Secondary | ICD-10-CM

## 2017-11-06 DIAGNOSIS — Z85828 Personal history of other malignant neoplasm of skin: Secondary | ICD-10-CM | POA: Diagnosis not present

## 2017-11-06 DIAGNOSIS — Z85820 Personal history of malignant melanoma of skin: Secondary | ICD-10-CM | POA: Diagnosis not present

## 2017-11-06 DIAGNOSIS — D2272 Melanocytic nevi of left lower limb, including hip: Secondary | ICD-10-CM | POA: Diagnosis not present

## 2017-11-06 NOTE — Progress Notes (Signed)
[address]       [place] [address]             [phone]     HPI: [name] returns to discuss the results of his bronchoscopy  Timothy Guerrero is a 77 year old man with a history of tobacco abuse in his best exposure.  He also has a history of a right lower lobectomy for stage IA non-small cell carcinoma in June 2016.  Other medical issues include type 2 diabetes, hypertension, hyperlipidemia, melanoma, prostate cancer, sleep apnea, and postoperative atrial flutter.  He has been having problems with hemoptysis for the past year.  It usually in the mornings.  He had a bronchoscopy in May 2018 which was nondiagnostic.  I saw him for the first time a couple years and we did a CT scan which showed a question of an abnormality in the trachea.  I did a bronchoscopy last week which revealed a tumor on the anterior portion of the trachea with a cystic component.  Biopsies were not done due to his hemoptysis and the lack of airway control.  Past Medical History:  Diagnosis Date  . Anemia   . Arthritis   . Atrial flutter ([place])    post op following lobectomy  . Benign essential tremor   . Blood transfusion without reported diagnosis   . Cataract   . CHF (congestive heart failure) ([place])   . Colon polyp    2007, polyp with early cancer, one year f/u no residual polyp. next TCS 2010, see PSH. [facility].  [name] COPD (chronic obstructive pulmonary disease) ([place])   . Diabetes mellitus without complication ([place])    10 years  . GERD (gastroesophageal reflux disease)   . HOH (hard of hearing)   . Hypercholesteremia   . Hypertension    10 years  . Lung cancer ([place])   . Prostate cancer ([place])   . Shortness of breath dyspnea    due to lung mass  . Skin melanoma ([place])   . Sleep apnea    wears CPAP sometimes, cannot tolerate all the time.    Current Outpatient Medications  Medication Sig Dispense Refill  . amLODipine (NORVASC) 10 MG tablet Take 1 tablet (10 mg  total) by mouth daily. ((blood pressure)) 90 tablet 0  . capsaicin (ZOSTRIX) 0.025 % cream Apply 1 application topically 2 (two) times daily as needed (Foot pain).    [name] diltiazem (CARDIZEM) 30 MG tablet Take 1 tablet every 4 hours AS NEEDED for AFIB heart rate >100 45 tablet 1  . fluticasone (FLONASE) 50 MCG/ACT nasal spray Place 2 sprays into both nostrils daily. (Patient taking differently: Place 2 sprays into both nostrils daily as needed for allergies. ) 16 g 6  . fluticasone furoate-vilanterol (BREO ELLIPTA) 200-25 MCG/INH AEPB Inhale 1 puff daily into the lungs. 3 each 3  . furosemide (LASIX) 20 MG tablet Take 1 tablet (20 mg total) by mouth daily. 90 tablet 3  . gabapentin (NEURONTIN) 300 MG capsule TAKE 1 CAPSULE BY MOUTH THREE TIMES A DAY 90 capsule 5  . gabapentin (NEURONTIN) 300 MG capsule TAKE 1 CAPSULE BY MOUTH THREE TIMES A DAY 270 capsule 1  . glipiZIDE (GLUCOTROL) 10 MG tablet Take 1 tablet (10 mg total) by mouth daily before breakfast. ((for blood sugar)) 90 tablet 3  . Insulin Glargine (LANTUS SOLOSTAR) 100 UNIT/ML Solostar Pen Inject 20-40 Units into the skin daily at 10 pm. (Patient taking  differently: Inject 30 Units into the skin every morning. ) 5 pen PRN  . irbesartan (AVAPRO) 300 MG tablet Take 1 tablet (300 mg total) by mouth daily. 90 tablet 3  . levothyroxine (SYNTHROID, LEVOTHROID) 50 MCG tablet Take 1 tablet (50 mcg total) by mouth daily before breakfast. ((for thyroid)) 90 tablet 3  . magnesium oxide (MAG-OX) 400 MG tablet Take 400 mg by mouth 2 (two) times daily.    . metFORMIN (GLUCOPHAGE) 1000 MG tablet Take 1 tablet (1,000 mg total) by mouth 2 (two) times daily with a meal. 180 tablet 0  . metoprolol (TOPROL-XL) 200 MG 24 hr tablet Take 1 tablet (200 mg total) by mouth daily. 90 tablet 3  . pantoprazole (PROTONIX) 40 MG tablet Take 1 tablet (40 mg total) by mouth daily. 90 tablet 1  . Potassium Chloride ER 20 MEQ TBCR Take 20 mEq by mouth daily. Take only on days  when taking lasix (Patient taking differently: Take 20 mEq by mouth daily. ) 90 tablet 0  . Semaglutide (OZEMPIC) 0.25 or 0.5 MG/DOSE SOPN Inject 0.25 mg into the skin once a week. 9 mL 0  . valACYclovir (VALTREX) 1000 MG tablet Take 1,000 mg by mouth as needed (cold sores).      No current facility-administered medications for this visit.     Physical Exam BP (!) 161/80 (BP Location: Left Arm, Patient Position: Sitting, Cuff Size: Large)   Pulse 74   Resp 16   Ht 6' (1.829 m)   Wt 201 lb (91.2 kg)   SpO2 97% Comment: ON RA  BMI 27.85 kg/m  77 year old man in no acute distress Alert and oriented x3 with no focal deficits No cervical or subclavicular adenopathy Cardiac regular rate and rhythm Lungs clear breath sounds bilaterally  Diagnostic Tests: CT CHEST WITHOUT CONTRAST  TECHNIQUE: Multidetector CT imaging of the chest was performed following the standard protocol without IV contrast.  COMPARISON:  None.  FINDINGS: Cardiovascular: Coronary artery calcification and aortic atherosclerotic calcification.  Mediastinum/Nodes: No axillary or supraclavicular adenopathy. No mediastinal hilar adenopathy. No pericardial effusion. Esophagus normal.  Lungs/Pleura: Lungs are hyperinflated. Centrilobular emphysema present. Several benign-appearing nodular calcifications over the RIGHT hemidiaphragm.  Surgical suture line in the RIGHT lower lobe with stable nodular thickening best seen on coronal imaging (image 101/6).  Within the trachea, there is non dependent thickening with nodularity along the ventral margin of the trachea approximately 6 cm superior to the carina. (Image 39/series 10 and 4). Coronal series 6). Thickening to 4 mm.  Upper Abdomen: Limited view of the liver, kidneys, pancreas are unremarkable. Normal adrenal glands. Simple fluid attenuation lesion of the RIGHT kidney. Nodular thickening in the RIGHT adrenal gland to 15 mm. Unchanged from  prior.  Musculoskeletal: No aggressive osseous lesion  IMPRESSION: 1. Nodular thickening along the nondependent surface of the mid trachea. Differential including neoplasm versus adherent secretions. Stable postsurgical change in the RIGHT lower lobe without suspicious nodularity. 2. RIGHT lower lobectomy without evidence of recurrence along the suture line. Stable thickening compared to CT 09/01/2016 3. Hyperinflated lungs consistent with emphysema.  These results will be called to the ordering clinician or representative by the Radiologist Assistant, and communication documented in the PACS or zVision Dashboard.   Electronically Signed   By: Suzy Bouchard M.D.   On: 10/30/2017 14:27 I again reviewed the CT images.  Impression: Timothy Guerrero is a 77 year old gentleman with a history of tobacco abuse and a previous stage I non-small cell carcinoma of the  right lower lobe treatment lobectomy 3 years ago.  He now has a 1 year history of hemoptysis.  Recent bronchoscopy showed a tumor in the thoracic trachea.  Biopsies were not taken due to having an uncontrolled airway and his history of hemoptysis.  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  Squamous cell carcinoma is also in the differential.  Carcinoid is less likely.  He needs a PET/CT to further evaluate the lesion and look for any other sites of disease.  That will help guide her initial diagnostic work-up.  He needs a repeat bronchoscopy under general anesthesia with possibility of laser bronchoscopy.  I discussed the indications, risks, benefits, and alternatives to bronchoscopy.  He understands it similar to his recent procedure but with a higher risk of bleeding due to biopsies.  The other risks are the same.  Plan: Bronchoscopy under general anesthesia, possible laser bronchoscopy on Thursday, 11/15/2017  Melrose Nakayama, MD [facility] [phone]

## 2017-11-07 ENCOUNTER — Telehealth: Payer: Self-pay | Admitting: Family Medicine

## 2017-11-08 NOTE — Telephone Encounter (Signed)
Spoke with pt regarding Shingrix Price checked [facility]

## 2017-11-13 ENCOUNTER — Other Ambulatory Visit: Payer: Self-pay | Admitting: *Deleted

## 2017-11-14 ENCOUNTER — Ambulatory Visit (HOSPITAL_COMMUNITY)
Admission: RE | Admit: 2017-11-14 | Discharge: 2017-11-14 | Disposition: A | Discharge status: Home / Self Care | Payer: Medicare HMO | Source: Ambulatory Visit | Attending: Thoracic Surgery (Cardiothoracic Vascular Surgery) | Admitting: Thoracic Surgery (Cardiothoracic Vascular Surgery)

## 2017-11-14 DIAGNOSIS — D381 Neoplasm of uncertain behavior of trachea, bronchus and lung: Secondary | ICD-10-CM | POA: Insufficient documentation

## 2017-11-14 DIAGNOSIS — Z01818 Encounter for other preprocedural examination: Secondary | ICD-10-CM | POA: Insufficient documentation

## 2017-11-14 DIAGNOSIS — M5136 Other intervertebral disc degeneration, lumbar region: Secondary | ICD-10-CM | POA: Diagnosis not present

## 2017-11-14 DIAGNOSIS — E279 Disorder of adrenal gland, unspecified: Secondary | ICD-10-CM | POA: Insufficient documentation

## 2017-11-14 DIAGNOSIS — M4316 Spondylolisthesis, lumbar region: Secondary | ICD-10-CM | POA: Diagnosis not present

## 2017-11-14 DIAGNOSIS — I700 Atherosclerosis of aorta: Secondary | ICD-10-CM | POA: Insufficient documentation

## 2017-11-14 DIAGNOSIS — M419 Scoliosis, unspecified: Secondary | ICD-10-CM | POA: Insufficient documentation

## 2017-11-14 DIAGNOSIS — I2510 Atherosclerotic heart disease of native coronary artery without angina pectoris: Secondary | ICD-10-CM | POA: Insufficient documentation

## 2017-11-14 DIAGNOSIS — Z85118 Personal history of other malignant neoplasm of bronchus and lung: Secondary | ICD-10-CM | POA: Insufficient documentation

## 2017-11-14 DIAGNOSIS — C3490 Malignant neoplasm of unspecified part of unspecified bronchus or lung: Secondary | ICD-10-CM | POA: Diagnosis not present

## 2017-11-14 DIAGNOSIS — M47816 Spondylosis without myelopathy or radiculopathy, lumbar region: Secondary | ICD-10-CM | POA: Insufficient documentation

## 2017-11-14 DIAGNOSIS — J929 Pleural plaque without asbestos: Secondary | ICD-10-CM | POA: Insufficient documentation

## 2017-11-14 DIAGNOSIS — Z902 Acquired absence of lung [part of]: Secondary | ICD-10-CM | POA: Diagnosis not present

## 2017-11-14 LAB — GLUCOSE, CAPILLARY: Glucose-Capillary: 180 mg/dL — ABNORMAL HIGH (ref 65–99)

## 2017-11-14 MED ORDER — FLUDEOXYGLUCOSE F - 18 (FDG) INJECTION
9.5600 | Freq: Once | INTRAVENOUS | Status: AC | PRN
  Administered 2017-11-14: 9.56 via INTRAVENOUS

## 2017-11-16 ENCOUNTER — Ambulatory Visit: Payer: Medicare HMO | Admitting: Thoracic Surgery (Cardiothoracic Vascular Surgery)

## 2017-11-16 ENCOUNTER — Encounter (HOSPITAL_COMMUNITY): Admission: RE | Payer: Self-pay | Source: Ambulatory Visit

## 2017-11-16 ENCOUNTER — Ambulatory Visit (HOSPITAL_COMMUNITY)
Admission: RE | Admit: 2017-11-16 | Payer: Medicare HMO | Source: Ambulatory Visit | Admitting: Thoracic Surgery (Cardiothoracic Vascular Surgery)

## 2017-11-16 ENCOUNTER — Encounter: Payer: Self-pay | Admitting: Thoracic Surgery (Cardiothoracic Vascular Surgery)

## 2017-11-16 ENCOUNTER — Other Ambulatory Visit: Payer: Self-pay

## 2017-11-16 VITALS — BP 179/96 | HR 85 | Resp 16 | Ht 72.0 in | Wt 200.0 lb

## 2017-11-16 DIAGNOSIS — Z85118 Personal history of other malignant neoplasm of bronchus and lung: Secondary | ICD-10-CM

## 2017-11-16 DIAGNOSIS — Z902 Acquired absence of lung [part of]: Secondary | ICD-10-CM | POA: Diagnosis not present

## 2017-11-16 DIAGNOSIS — R042 Hemoptysis: Secondary | ICD-10-CM

## 2017-11-16 SURGERY — BRONCHOSCOPY, VIDEO-ASSISTED
Anesthesia: General

## 2017-11-16 NOTE — Progress Notes (Signed)
[address]       [place] [address]             [phone]     HPI: Mr. Fanelli returns for follow-up to discuss the cytology results from his bronchoscopy.  Mr. Schweer is a 77 year old man with a history of tobacco abuse and asbestos exposure.  He had a thoracoscopic right lower lobectomy for a stage Ia non-small cell carcinoma in June 2016.  He was following up with Dr. Whitney Muse at [facility] until she left.  He was followed by Dr. Ashok Cordia for his COPD.  He has been having hemoptysis for about a year.  Dr. Ashok Cordia did a bronchoscopy in May 2018 which was nondiagnostic.  I did a bronchoscopy on 11/01/2017 and there was a tracheal mass about 9 cm below the cords.  This was around relatively lesion with a central polypoid component.  I did not biopsy due to concern for bleeding.  We did do bronchial washings.  The cytologies came back showing non-small cell carcinoma.  He continues to have hemoptysis mostly in the mornings.  Past Medical History:  Diagnosis Date  . Anemia   . Arthritis   . Atrial flutter ([place])    post op following lobectomy  . Benign essential tremor   . Blood transfusion without reported diagnosis   . Cataract   . CHF (congestive heart failure) ([place])   . Colon polyp    2007, polyp with early cancer, one year f/u no residual polyp. next TCS 2010, see PSH. [facility].  [name] COPD (chronic obstructive pulmonary disease) ([place])   . Diabetes mellitus without complication ([place])    10 years  . GERD (gastroesophageal reflux disease)   . HOH (hard of hearing)   . Hypercholesteremia   . Hypertension    10 years  . Lung cancer ([place])   . Prostate cancer ([place])   . Shortness of breath dyspnea    due to lung mass  . Skin melanoma ([place])   . Sleep apnea    wears CPAP sometimes, cannot tolerate all the time.    Current Outpatient Medications  Medication Sig Dispense Refill  . albuterol (PROVENTIL HFA;VENTOLIN HFA) 108 (90 Base) MCG/ACT inhaler  Inhale 1 puff into the lungs every 6 (six) hours as needed for wheezing or shortness of breath.    [name] amLODipine (NORVASC) 10 MG tablet Take 1 tablet (10 mg total) by mouth daily. ((blood pressure)) 90 tablet 0  . capsaicin (ZOSTRIX) 0.025 % cream Apply 1 application topically 2 (two) times daily as needed (Foot pain).    [name] diltiazem (CARDIZEM) 30 MG tablet Take 1 tablet every 4 hours AS NEEDED for AFIB heart rate >100 45 tablet 1  . fluticasone (FLONASE) 50 MCG/ACT nasal spray Place 2 sprays into both nostrils daily. (Patient taking differently: Place 1 spray into both nostrils daily as needed for allergies. ) 16 g 6  . furosemide (LASIX) 20 MG tablet Take 1 tablet (20 mg total) by mouth daily. 90 tablet 3  . glipiZIDE (GLUCOTROL) 10 MG tablet Take 1 tablet (10 mg total) by mouth daily before breakfast. ((for blood sugar)) 90 tablet 3  . Insulin Glargine (LANTUS SOLOSTAR) 100 UNIT/ML Solostar Pen Inject 20-40 Units into the skin daily at 10 pm. (Patient taking differently: Inject 30 Units into the skin every morning. ) 5 pen PRN  . irbesartan (AVAPRO) 300 MG tablet Take 1 tablet (300 mg total) by  mouth daily. 90 tablet 3  . levothyroxine (SYNTHROID, LEVOTHROID) 50 MCG tablet Take 1 tablet (50 mcg total) by mouth daily before breakfast. ((for thyroid)) 90 tablet 3  . magnesium oxide (MAG-OX) 400 MG tablet Take 400 mg by mouth 2 (two) times daily.    . metFORMIN (GLUCOPHAGE) 1000 MG tablet Take 1 tablet (1,000 mg total) by mouth 2 (two) times daily with a meal. 180 tablet 0  . metoprolol (TOPROL-XL) 200 MG 24 hr tablet Take 1 tablet (200 mg total) by mouth daily. 90 tablet 3  . pantoprazole (PROTONIX) 40 MG tablet Take 1 tablet (40 mg total) by mouth daily. 90 tablet 1  . Potassium Chloride ER 20 MEQ TBCR Take 20 mEq by mouth daily. Take only on days when taking lasix 90 tablet 0  . Semaglutide (OZEMPIC) 0.25 or 0.5 MG/DOSE SOPN Inject 0.25 mg into the skin once a week. 9 mL 0  . Tiotropium  Bromide-Olodaterol (STIOLTO RESPIMAT IN) Inhale 1 puff into the lungs daily as needed (shortness of breath).    . valACYclovir (VALTREX) 1000 MG tablet Take 1,000 mg by mouth daily as needed (cold sores).     . fluticasone furoate-vilanterol (BREO ELLIPTA) 200-25 MCG/INH AEPB Inhale 1 puff daily into the lungs. (Patient not taking: Reported on 11/12/2017) 3 each 3  . gabapentin (NEURONTIN) 300 MG capsule TAKE 1 CAPSULE BY MOUTH THREE TIMES A DAY (Patient not taking: Reported on 11/12/2017) 90 capsule 5   No current facility-administered medications for this visit.     Physical Exam BP (!) 179/96 (BP Location: Right Arm, Patient Position: Sitting, Cuff Size: Normal)   Pulse 85   Resp 16   Ht 6' (1.829 m)   Wt 200 lb (90.7 kg)   SpO2 96% Comment: ON RA  BMI 27.12 kg/m  Well-appearing 77 year old man in no acute distress Alert and oriented x3 with no focal deficits Lungs clear  Diagnostic Tests: Cytology Diagnosis BRONCHIAL WASHING (SPECIMEN 1 OF 1, COLLECTED 11/01/17): - MALIGNANT CELLS CONSISTENT WITH NON-SMALL CELL CARCINOMA DAWN BUTLER MD Pathologist, Electronic Signature (Case signed 11/02/2017)  Impression: Mr. Lynk is a 77 year old gentleman with a history of tobacco abuse and asbestos exposure who had a right lower lobectomy for stage IA squamous cell carcinoma in 2016.  He has been having hemoptysis for about a year.  A previous bronchoscopy was nondiagnostic.  A more recent bronchoscopy showed a tracheal tumor about 9 cm below the vocal cords.  This was a flat lesion approximately 1.5 to 2 cm in diameter with a central polypoid component.  Cytology is positive for malignant cells.  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  I do think he is a surgical candidate.  I discussed some of the issues related to surgery with him.  Primary among those issues are his previous right VATS with likely scarring, concern for tracheal blood supply  and concern for recurrent nerve injury.  He understands it tracheal tumors are relatively rare and so that there are few centers with extensive experience.  I do have some experience but it is only a few cases over many years.   Another option would be primary radiation therapy.  I do not think this the best option in his case, but it is worth him talking with radiation oncology before making a decision.  He may choose to opt for that as the primary therapy.  I will arrange for that appointment.  Plan: Appointment to see  radiation oncology and multidisciplinary thoracic oncology clinic.  I will see him back in 2 weeks to further discuss treatment options.  Melrose Nakayama, MD [facility] [phone]

## 2017-11-27 NOTE — Progress Notes (Signed)
Thoracic Location of Tumor / Histology: Squamous cell carcinoma of right lung   History:  He had a thoracoscopic right lower lobectomy for a stage Ia non-small cell carcinoma in June 2016.   He had a bronchoscopy by Dr. Ashok Cordia in May 2018.  There were no bloody secretions and no airway abnormalities.  The bronchial stump was well-healed.  Biopsies revealed: 11/01/2017 BRONCHIAL WASHING (SPECIMEN 1 OF 1, COLLECTED 11/01/17): - MALIGNANT CELLS CONSISTENT WITH NON-SMALL CELL CARCINOMA  Tobacco/Marijuana/Snuff/ETOH use:  Smoked ~2 packs/day for 32 years (Quit January 1988); Never used snuff or marijuana. Occasionally drinks wine.  Past/Anticipated interventions by cardiothoracic surgery, if any:  -Bronchoscopy on 11/01/2017 (was a tracheal mass about 9 cm below the cords). Bid not biopsy due to concern for bleeding; Did do bronchial washings; The cytologies came back showing non-small cell carcinoma.  Past/Anticipated interventions by medical oncology, if any: None  Signs/Symptoms  Weight changes, if any: Food does not taste the same and he does not have much interest in eating  Respiratory complaints, if any: Dry cough  Hemoptysis, if any: Yes--most mornings  Pain issues, if any:  Burning sensation in the RUQ of the chest. No precipitating factors, dissipates on its own.  SAFETY ISSUES:  Prior radiation? No  Pacemaker/ICD? No   Possible current pregnancy? No  Is the patient on methotrexate? No  Current Complaints / other details:   Timothy Guerrero presents today with his daughter for his radiation consult. Per Dr. 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 I do think he is a surgical candidate.  I discussed some of the issues related to surgery with him.  Primary among those issues are his previous right VATS with likely scarring, concern for tracheal blood supply and concern for recurrent nerve injury. Another option would be  primary radiation therapy.  I do not think this the best option in his case, but it is worth him talking with radiation oncology before making a decision." He has a lot of anxiety around his diagnosis because the last time he had his "chest looked at, there wasn't anything there. And now I have this large tumor. Why are they waiting so long to do anything about it?" He would like to pursue proton-radiation therapy and has another consult at [facility] to see what their team can offer.

## 2017-11-28 ENCOUNTER — Ambulatory Visit
Admission: RE | Admit: 2017-11-28 | Discharge: 2017-11-28 | Disposition: A | Discharge status: Home / Self Care | Payer: Medicare HMO | Source: Ambulatory Visit | Attending: Radiation Oncology | Admitting: Radiation Oncology

## 2017-11-28 ENCOUNTER — Other Ambulatory Visit: Payer: Self-pay

## 2017-11-28 ENCOUNTER — Encounter: Payer: Self-pay | Admitting: Radiation Oncology

## 2017-11-28 VITALS — BP 148/96 | HR 96 | Temp 98.2°F | Resp 20 | Wt 195.2 lb

## 2017-11-28 DIAGNOSIS — Z902 Acquired absence of lung [part of]: Secondary | ICD-10-CM | POA: Diagnosis not present

## 2017-11-28 DIAGNOSIS — J398 Other specified diseases of upper respiratory tract: Secondary | ICD-10-CM | POA: Diagnosis not present

## 2017-11-28 DIAGNOSIS — C3491 Malignant neoplasm of unspecified part of right bronchus or lung: Secondary | ICD-10-CM

## 2017-11-28 DIAGNOSIS — C3431 Malignant neoplasm of lower lobe, right bronchus or lung: Secondary | ICD-10-CM | POA: Diagnosis not present

## 2017-11-28 NOTE — Progress Notes (Signed)
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 male who originally had a thoracoscopic right lower lobectomy for a Stage Ia non-small cell carcinoma in June 2016. The patient has been experiencing morning hemoptysis for about one year. He underwent Chest CT on 10/30/2017 which showed nodular thickening along the nondependent surface of the mid trachea, differential including neoplasm versus adherent secretions. Stable post-surgical change in the right lower lobe without suspicious nodularity. Right lower lobectomy without evidence of recurrence along the suture line. Stable thickening compared to CT 09/01/2016.  PET scan on 11/14/2017 showed no significant abnormal hypermetabolic activity to suggest active malignancy. There is plaque-like wall thickening in the right anterior trachea as shown on prior CT scan although with less obvious polypoid components. The thickening is about 5 mm in thickness, and could be below sensitive PET-CT size thresholds, although does not register as hypermetabolic.   He had a bronchoscopy by Dr. Ashok Cordia in May 2018. This study was non-diagnostic. Mild hemoptysis continued over the next several months.  Patient proceeded to undergo bronchoscopy by Dr. Roxan Hockey on 11/01/2017. There were no bloody secretions and no airway abnormalities. The bronchial stump was healed. However a tracheal mass about 9 cm below the cords was appreciated. This mass measured approximately 2 x 2 centimeters.  Biopsy was not obtained due to concern for bleeding. Pathology from bronchial  washing revealed malignant cells consistent with non-small cell carcinoma.  The patient's case was discussed at our thoracic tumor board conference. The patient is here for further evaluation and discussion of radiation treatment options in the management of his disease.  PREVIOUS RADIATION THERAPY: No  PAST MEDICAL HISTORY:  has a past medical history of Anemia, Arthritis, Atrial flutter ([place]), Benign essential tremor, Blood transfusion without reported diagnosis, Cataract, CHF (congestive heart failure) ([place]), Colon polyp, COPD (chronic obstructive pulmonary disease) ([place]), Diabetes mellitus without complication ([place]), GERD (gastroesophageal reflux disease), HOH (hard of hearing), Hypercholesteremia, Hypertension, Lung cancer ([place]), Prostate cancer ([place]), Shortness of breath dyspnea, Skin melanoma ([place]), and Sleep apnea.    PAST SURGICAL HISTORY: Past Surgical History:  Procedure Laterality Date  . bottom lobe of right lung removed  12/17/14  . CARDIAC CATHETERIZATION     no PCI  . CATARACT EXTRACTION Left   . CATARACT EXTRACTION W/PHACO Left 01/03/2016   Procedure: CATARACT EXTRACTION PHACO AND INTRAOCULAR LENS PLACEMENT LEFT EYE CDE=7.78;  Surgeon: Williams Che, MD;  Location: [facility];  Service: Ophthalmology;  Laterality: Left;  . CATARACT EXTRACTION W/PHACO Right 03/20/2016   Procedure: CATARACT EXTRACTION PHACO AND INTRAOCULAR LENS PLACEMENT; CDE:  9.58;  Surgeon: Williams Che, MD;  Location: [facility];  Service: Ophthalmology;  Laterality: Right;  . COLONOSCOPY  08/2008   Dr. Leafy Half, [place]: normal, internal grade 1 hemorrhoids. next TCS 08/2013  . COLONOSCOPY N/A 03/04/2014   Procedure: COLONOSCOPY;  Surgeon: Daneil Dolin, MD;  Location: [facility];  Service: Endoscopy;  Laterality: N/A;  7:30am  . EYE SURGERY    . knee replacements     bilateral. over 10 years ago  . LOBECTOMY Right 12/17/2014  Procedure: RIGHT LOWER LOBECTOMY;  Surgeon: Melrose Nakayama, MD;  Location: [place];  Service: Thoracic;  Laterality: Right;  . LYMPH NODE DISSECTION Right 12/17/2014   Procedure: LYMPH NODE DISSECTION;  Surgeon: Melrose Nakayama, MD;  Location: [place];  Service: Thoracic;  Laterality: Right;  . NECK SURGERY     melanoma removed.  [name] PROSTATE SURGERY     prostatectomy, radical  . TOTAL HIP ARTHROPLASTY  2007   left  . total shoulder replacement  2011   left  . VIDEO ASSISTED THORACOSCOPY (VATS)/WEDGE RESECTION Right 12/17/2014   Procedure: RIGHT VIDEO ASSISTED THORACOSCOPY (VATS);  Surgeon: Melrose Nakayama, MD;  Location: [place];  Service: Thoracic;  Laterality: Right;  [name] VIDEO BRONCHOSCOPY Bilateral 11/17/2016   Procedure: VIDEO BRONCHOSCOPY WITHOUT FLUORO;  Surgeon: Javier Glazier, MD;  Location: [facility];  Service: Cardiopulmonary;  Laterality: Bilateral;  . VIDEO BRONCHOSCOPY N/A 11/01/2017   Procedure: VIDEO BRONCHOSCOPY;  Surgeon: Melrose Nakayama, MD;  Location: [facility];  Service: Thoracic;  Laterality: N/A;    FAMILY HISTORY: family history includes Breast cancer in his sister; COPD in his sister; Cancer in his mother; Cancer (age of onset: 10) in his sister; Heart attack in his daughter; Prostate cancer in his son and son.  SOCIAL HISTORY:  reports that he quit smoking about 31 years ago. His smoking use included cigarettes. He has a 64.00 pack-year smoking history. He has never used smokeless tobacco. He reports that he drinks alcohol. He reports that he does not use drugs.  ALLERGIES: Patient has no known allergies.  MEDICATIONS:  Current Outpatient Medications  Medication Sig Dispense Refill  . albuterol (PROVENTIL HFA;VENTOLIN HFA) 108 (90 Base) MCG/ACT inhaler Inhale 1 puff into the lungs every 6 (six) hours as needed for wheezing or shortness of breath.    [name] amLODipine (NORVASC) 10 MG tablet Take 1 tablet (10 mg total) by mouth daily. ((blood pressure)) 90 tablet 0  . capsaicin (ZOSTRIX) 0.025 % cream Apply 1  application topically 2 (two) times daily as needed (Foot pain).    . furosemide (LASIX) 20 MG tablet Take 1 tablet (20 mg total) by mouth daily. 90 tablet 3  . glipiZIDE (GLUCOTROL) 10 MG tablet Take 1 tablet (10 mg total) by mouth daily before breakfast. ((for blood sugar)) 90 tablet 3  . Insulin Glargine (LANTUS SOLOSTAR) 100 UNIT/ML Solostar Pen Inject 20-40 Units into the skin daily at 10 pm. (Patient taking differently: Inject 30 Units into the skin every morning. ) 5 pen PRN  . irbesartan (AVAPRO) 300 MG tablet Take 1 tablet (300 mg total) by mouth daily. 90 tablet 3  . levothyroxine (SYNTHROID, LEVOTHROID) 50 MCG tablet Take 1 tablet (50 mcg total) by mouth daily before breakfast. ((for thyroid)) 90 tablet 3  . magnesium oxide (MAG-OX) 400 MG tablet Take 400 mg by mouth 2 (two) times daily.    . metFORMIN (GLUCOPHAGE) 1000 MG tablet Take 1 tablet (1,000 mg total) by mouth 2 (two) times daily with a meal. 180 tablet 0  . metoprolol (TOPROL-XL) 200 MG 24 hr tablet Take 1 tablet (200 mg total) by mouth daily. 90 tablet 3  . pantoprazole (PROTONIX) 40 MG tablet Take 1 tablet (40 mg total) by mouth daily. 90 tablet 1  . Potassium Chloride ER 20 MEQ TBCR Take 20 mEq by mouth daily. Take only on days when taking lasix 90 tablet 0  . Semaglutide (OZEMPIC) 0.25 or 0.5 MG/DOSE SOPN Inject 0.25 mg into the  skin once a week. 9 mL 0  . Tiotropium Bromide-Olodaterol (STIOLTO RESPIMAT IN) Inhale 1 puff into the lungs daily as needed (shortness of breath).    . valACYclovir (VALTREX) 1000 MG tablet Take 1,000 mg by mouth daily as needed (cold sores).      No current facility-administered medications for this encounter.     REVIEW OF SYSTEMS:  On review of systems, the patient reports that he is doing well overall. The patient is accompanied by his daughter today. The patient states food does not taste the same and he does not have much interest in eating. He reports a dry cough. He also reports hemoptysis  most mornings. He reports a burning sensation in the RUQ of the chest which has no precipitating factors and dissipates on its own. He denies headaches or double vision. Patient has had bilateral knee surgeries. REVIEW OF SYSTEMS: A 10+ POINT REVIEW OF SYSTEMS WAS OBTAINED including neurology, dermatology, psychiatry, cardiac, respiratory, lymph, extremities, GI, GU, musculoskeletal, constitutional, reproductive, HEENT. All pertinent positives are noted in the HPI. All others are negative.  PHYSICAL EXAM:  weight is 195 lb 3.2 oz (88.5 kg). His oral temperature is 98.2 F (36.8 C). His blood pressure is 148/96 (abnormal) and his pulse is 96. His respiration is 20 and oxygen saturation is 100%.    General: Alert and oriented, in no acute distress HEENT: Head is normocephalic. Extraocular movements are intact. Oropharynx is clear.  Neck: Neck is supple, no palpable cervical or supraclavicular lymphadenopathy. Heart: Regular in rate and rhythm with no murmurs, rubs, or gallops. Chest: Clear to auscultation bilaterally, with no rhonchi, wheezes, or rales. Abdomen: Soft, nontender, nondistended, with no rigidity or guarding. Extremities: No cyanosis or edema. Lymphatics: see Neck Exam Skin: No concerning lesions. Small scar along the right lateral chest area, measures approximately 5 cm in length. Well healed with no signs of recurrence.  Musculoskeletal: symmetric strength and muscle tone throughout. Neurologic: Cranial nerves II through XII are grossly intact. No obvious focalities. Speech is fluent. Coordination is intact. Psychiatric: Judgment and insight are intact. Affect is appropriate.   ECOG = 1   LABORATORY DATA:  Lab Results  Component Value Date   WBC 7.6 11/01/2017   HGB 14.6 11/01/2017   HCT 44.5 11/01/2017   MCV 91.9 11/01/2017   PLT 258 11/01/2017   NEUTROABS 4.4 07/18/2017   Lab Results  Component Value Date   NA 142 11/01/2017   K 4.6 11/01/2017   CL 106 11/01/2017    CO2 27 11/01/2017   GLUCOSE 194 (H) 11/01/2017   CREATININE 0.94 11/01/2017   CALCIUM 9.5 11/01/2017      RADIOGRAPHY: Dg Chest 2 View  Result Date: 11/01/2017 CLINICAL DATA:  Preoperative exam prior to bronchoscopy. History of hemoptysis, former smoker, CHF, previous right lower lobectomy. EXAM: CHEST - 2 VIEW COMPARISON:  Chest x-ray of October 13, 2016 and chest CT scan of October 30, 2017. FINDINGS: The lungs are adequately inflated. There is stable mild blunting of the right lateral costophrenic angle. There is no alveolar infiltrate or pleural effusion. There is nodularity projecting over the posterior aspect of the left heart border on the lateral view which likely lies lateral to the cardiac apex on the frontal view. This is not visible on the CT scan of 2 days ago however. The heart and pulmonary vascularity are normal. There is calcification in the wall of the aortic arch. The bony thorax is unremarkable. IMPRESSION: There is no acute cardiopulmonary abnormality.  Thoracic aortic atherosclerosis. Electronically Signed   By: David  Martinique M.D.   On: 11/01/2017 09:31   Ct Chest Wo Contrast  Result Date: 10/30/2017 CLINICAL DATA:  Coughing up blood.  Hemoptysis.  Heavy smoker. EXAM: CT CHEST WITHOUT CONTRAST TECHNIQUE: Multidetector CT imaging of the chest was performed following the standard protocol without IV contrast. COMPARISON:  None. FINDINGS: Cardiovascular: Coronary artery calcification and aortic atherosclerotic calcification. Mediastinum/Nodes: No axillary or supraclavicular adenopathy. No mediastinal hilar adenopathy. No pericardial effusion. Esophagus normal. Lungs/Pleura: Lungs are hyperinflated. Centrilobular emphysema present. Several benign-appearing nodular calcifications over the RIGHT hemidiaphragm. Surgical suture line in the RIGHT lower lobe with stable nodular thickening best seen on coronal imaging (image 101/6). Within the trachea, there is non dependent thickening with  nodularity along the ventral margin of the trachea approximately 6 cm superior to the carina. (Image 39/series 10 and 4). Coronal series 6). Thickening to 4 mm. Upper Abdomen: Limited view of the liver, kidneys, pancreas are unremarkable. Normal adrenal glands. Simple fluid attenuation lesion of the RIGHT kidney. Nodular thickening in the RIGHT adrenal gland to 15 mm. Unchanged from prior. Musculoskeletal: No aggressive osseous lesion IMPRESSION: 1. Nodular thickening along the nondependent surface of the mid trachea. Differential including neoplasm versus adherent secretions. Stable postsurgical change in the RIGHT lower lobe without suspicious nodularity. 2. RIGHT lower lobectomy without evidence of recurrence along the suture line. Stable thickening compared to CT 09/01/2016 3. Hyperinflated lungs consistent with emphysema. These results will be called to the ordering clinician or representative by the Radiologist Assistant, and communication documented in the PACS or zVision Dashboard. Electronically Signed   By: Suzy Bouchard M.D.   On: 10/30/2017 14:27   Nm Pet Image Initial (pi) Skull Base To Thigh  Result Date: 11/14/2017 CLINICAL DATA:  Subsequent treatment strategy for non-small cell lung cancer. Possible tracheal tumor on recent CT. EXAM: NUCLEAR MEDICINE PET SKULL BASE TO THIGH TECHNIQUE: 5.6 mCi F-18 FDG was injected intravenously. Full-ring PET imaging was performed from the skull base to thigh after the radiotracer. CT data was obtained and used for attenuation correction and anatomic localization. Fasting blood glucose: 180 mg/dl COMPARISON:  Multiple exams, including 10/30/2017 FINDINGS: Mediastinal blood pool activity: SUV max 3.0 NECK: No significant abnormal hypermetabolic activity in this region. Incidental CT findings: Common carotid atherosclerotic calcifications noted. CHEST: Previously seen tracheal wall thickening and plaque-like or polypoid mucosal irregularity is suggested on image  62/4 but does not have appreciable associated accentuated metabolic activity. The thickening only amounts to about 5 mm and accordingly might be below sensitive PET-CT size thresholds. Such a finding could also be postinflammatory or due to non malignant etiologies such as tracheal papillomatosis. Overall no significant abnormal hypermetabolic adenopathy is identified in the chest. Incidental CT findings: Coronary, aortic arch, and branch vessel atherosclerotic vascular disease. Bilateral calcified pleural plaques. Right lower lobectomy. ABDOMEN/PELVIS: Physiologic activity in bowel. Overall no significant worrisome abnormal hypermetabolic activity in the abdomen/pelvis. Incidental CT findings: 2.0 by 1.5 cm right adrenal mass is not appreciably hypermetabolic and is similar to the 10/25/2010 exam, hence most compatible with a benign lesion such as adrenal adenoma. Photopenic right renal lesions favoring cysts. Aortoiliac atherosclerotic vascular disease. SKELETON: No significant abnormal hypermetabolic activity in this region. Incidental CT findings: Left total hip prosthesis. Lower lumbar spondylosis and degenerative disc disease with grade 1 anterolisthesis at L4-5. Left proximal humeral prosthesis. Mild dextroconvex lumbar scoliosis. IMPRESSION: 1. No significant abnormal hypermetabolic activity is identified to suggest active malignancy. 2. Plaque-like wall thickening  in the right anterior trachea as shown on prior CT scan although with less obvious polypoid components. The thickening is about 5 mm in thickness, and could be below sensitive PET-CT size thresholds, although does not register is hypermetabolic. Bronchoscopy may be warranted to further assess. Possibilities might include postinflammatory thickening, tracheal papilloma, or early malignancy. 3. Other imaging findings of potential clinical significance: Aortic Atherosclerosis (ICD10-I70.0). Coronary atherosclerosis. Bilateral calcified pleural  plaques. Right lower lobectomy. Lower lumbar spondylosis and degenerative disc disease with mild dextroconvex lumbar scoliosis. Benign-appearing right adrenal lesion is technically nonspecific but favors adenoma and is not appreciably changed from 10/25/2010. Electronically Signed   By: Van Clines M.D.   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 We did review the patient's imaging and pathology last week at the multidisciplinary thoracic oncology conference.  Surgery would be very difficult in this location and with the patient's age and comorbidities this may not be his best option. The patient may go to [facility] for second opinion concerning surgical management of this tumor. The patient was inquiring about potential proton therapy management of this lesion, however,  I do not see any big benefit with this therapy in this location. Plus given the added inconvenience of moving to a location to have this therapy would be quite difficult. I would recommend covering the primary tumor mass and mediastinum with his initial course of treatment, followed by a boost to the tracheal lesion. Treatments would extend over 6 to 6 1/2 weeks. We discussed the addition of potential chemotherapy at the multidisciplinary conference and given the small volume of tumor, chemotherapy was not recommended. Patient will meet again with Dr. Roxan Hockey on June 11th at 1:45 pm for further evaluation. We discussed the general course of treatment, potential toxicities and risks involved with curative radiation therapy.  PLAN: Treatment plan pending further input from thoracic surgery on June 11th and potential consultation at [facility] concerning surgery.      ------------------------------------------------  Blair Promise, PhD, MD  This document serves as a record of services personally performed by Gery Pray, MD. It was created on his behalf by [name], a trained medical scribe. The creation of this record is based on the scribe's personal observations and the provider's statements to them. This document has been checked and approved by the attending provider.

## 2017-12-04 ENCOUNTER — Encounter: Payer: Self-pay | Admitting: Thoracic Surgery (Cardiothoracic Vascular Surgery)

## 2017-12-04 ENCOUNTER — Ambulatory Visit: Payer: Medicare HMO | Admitting: Thoracic Surgery (Cardiothoracic Vascular Surgery)

## 2017-12-04 ENCOUNTER — Other Ambulatory Visit: Payer: Self-pay

## 2017-12-04 ENCOUNTER — Ambulatory Visit: Payer: Medicare HMO | Admitting: Family Medicine

## 2017-12-04 VITALS — BP 128/85 | HR 114 | Resp 16 | Ht 72.0 in | Wt 200.0 lb

## 2017-12-04 DIAGNOSIS — C3491 Malignant neoplasm of unspecified part of right bronchus or lung: Secondary | ICD-10-CM | POA: Diagnosis not present

## 2017-12-04 DIAGNOSIS — D381 Neoplasm of uncertain behavior of trachea, bronchus and lung: Secondary | ICD-10-CM | POA: Diagnosis not present

## 2017-12-04 NOTE — Progress Notes (Signed)
[address]       Timothy Guerrero [id number]             [phone]       HPI: Timothy Guerrero returns today to discuss treatment of his tracheal tumor.  Timothy Guerrero is a 77 year old gentleman with a history of tobacco abuse and asbestos exposure.  He had a thoracoscopic right lower lobectomy in June 2016 for stage Ia non-small cell carcinoma.  He has been having hemoptysis for about a year.  Bronchoscopy in May 2018 was nondiagnostic.  He presented to me this spring.  A CT of the chest showed a possible tracheal abnormality.  I did bronchoscopy.  There was a tracheal mass 9 cm below the cords.  This was a relatively flat lesion.  Washings showed non-small cell carcinoma.  On PET there was no evidence of regional or distant metastatic disease.  We discussed surgery followed by radiation versus radiation for treatment.  He met with Dr. Sondra Come last week.  Past Medical History:  Diagnosis Date  . Anemia   . Arthritis   . Atrial flutter ([place])    post op following lobectomy  . Benign essential tremor   . Blood transfusion without reported diagnosis   . Cataract   . CHF (congestive heart failure) ([place])   . Colon polyp    2007, polyp with early cancer, one year f/u no residual polyp. next TCS 2010, see PSH. [facility].  [name] COPD (chronic obstructive pulmonary disease) ([place])   . Diabetes mellitus without complication ([place])    10 years  . GERD (gastroesophageal reflux disease)   . HOH (hard of hearing)   . Hypercholesteremia   . Hypertension    10 years  . Lung cancer ([place])   . Prostate cancer ([place])   . Shortness of breath dyspnea    due to lung mass  . Skin melanoma ([place])   . Sleep apnea    wears CPAP sometimes, cannot tolerate all the time.    Current Outpatient Medications  Medication Sig Dispense Refill  . albuterol (PROVENTIL HFA;VENTOLIN HFA) 108 (90 Base) MCG/ACT inhaler Inhale 1 puff into the lungs every 6 (six) hours as needed for wheezing or  shortness of breath.    [name] amLODipine (NORVASC) 10 MG tablet Take 1 tablet (10 mg total) by mouth daily. ((blood pressure)) 90 tablet 0  . capsaicin (ZOSTRIX) 0.025 % cream Apply 1 application topically 2 (two) times daily as needed (Foot pain).    . furosemide (LASIX) 20 MG tablet Take 1 tablet (20 mg total) by mouth daily. 90 tablet 3  . glipiZIDE (GLUCOTROL) 10 MG tablet Take 1 tablet (10 mg total) by mouth daily before breakfast. ((for blood sugar)) 90 tablet 3  . Insulin Glargine (LANTUS SOLOSTAR) 100 UNIT/ML Solostar Pen Inject 20-40 Units into the skin daily at 10 pm. (Patient taking differently: Inject 30 Units into the skin every morning. ) 5 pen PRN  . irbesartan (AVAPRO) 300 MG tablet Take 1 tablet (300 mg total) by mouth daily. 90 tablet 3  . levothyroxine (SYNTHROID, LEVOTHROID) 50 MCG tablet Take 1 tablet (50 mcg total) by mouth daily before breakfast. ((for thyroid)) 90 tablet 3  . magnesium oxide (MAG-OX) 400 MG tablet Take 400 mg by mouth 2 (two) times daily.    . metFORMIN (GLUCOPHAGE) 1000 MG tablet Take 1 tablet (1,000 mg total) by mouth 2 (two) times daily with a meal. 180 tablet 0  .  metoprolol (TOPROL-XL) 200 MG 24 hr tablet Take 1 tablet (200 mg total) by mouth daily. 90 tablet 3  . pantoprazole (PROTONIX) 40 MG tablet Take 1 tablet (40 mg total) by mouth daily. 90 tablet 1  . Potassium Chloride ER 20 MEQ TBCR Take 20 mEq by mouth daily. Take only on days when taking lasix 90 tablet 0  . Semaglutide (OZEMPIC) 0.25 or 0.5 MG/DOSE SOPN Inject 0.25 mg into the skin once a week. 9 mL 0  . Tiotropium Bromide-Olodaterol (STIOLTO RESPIMAT IN) Inhale 1 puff into the lungs daily as needed (shortness of breath).    . valACYclovir (VALTREX) 1000 MG tablet Take 1,000 mg by mouth daily as needed (cold sores).      No current facility-administered medications for this visit.     Physical Exam BP 128/85 (BP Location: Right Arm, Patient Position: Sitting, Cuff Size: Normal)   Pulse (!)  114   Resp 16   Ht 6' (1.829 m)   Wt 200 lb (90.7 kg)   SpO2 97% Comment: ON RA  BMI 27.36 kg/m  77 year old man in no acute distress  Diagnostic Tests: I reviewed the PET/CT images  Impression: Timothy Guerrero is a 77 year old man with a history of tobacco abuse and a stage Ia non-small cell carcinoma treated with right lower lobectomy back in 2016.  He has been having persistent hemoptysis over a long period of time.  CT of the chest showed a possible polypoid lesion in the trachea.  At bronchoscopy there was a relatively flat tumor on the anterior portion of the trachea 9 cm below the cords.  Biopsies were not done because of concerns with bleeding, but washings came back positive for non-small cell carcinoma.  I think the best treatment in terms of survival would be surgical resection followed by adjuvant radiation.  Surgery in his case would be difficult to due to his prior right lower lobectomy.  He would require a right thoracotomy approach.  Adhesions could be a significant issue and mobilization to decrease tracheal tension could also be a significant issue.  This tumor is relatively small and isolated and may respond well to radiation alone.  After discussion with Dr. Sondra Come, Timothy Guerrero has decided he would like to proceed with radiation therapy in lieu of surgery.  He feels that at his age and with multiple previous surgeries he does not want to go through an operation at this time.  Plan: Follow-up with Dr. Darrick Penna, MD [facility] [phone]

## 2017-12-06 ENCOUNTER — Ambulatory Visit (INDEPENDENT_AMBULATORY_CARE_PROVIDER_SITE_OTHER): Payer: Medicare HMO | Admitting: Family Medicine

## 2017-12-06 ENCOUNTER — Encounter: Payer: Self-pay | Admitting: Family Medicine

## 2017-12-06 VITALS — BP 116/68 | HR 81 | Temp 97.4°F | Ht 72.0 in | Wt 194.4 lb

## 2017-12-06 DIAGNOSIS — E1159 Type 2 diabetes mellitus with other circulatory complications: Secondary | ICD-10-CM | POA: Diagnosis not present

## 2017-12-06 DIAGNOSIS — C3491 Malignant neoplasm of unspecified part of right bronchus or lung: Secondary | ICD-10-CM | POA: Diagnosis not present

## 2017-12-06 NOTE — Progress Notes (Signed)
   HPI  Patient presents today here for follow-up and discussion about his recent medical events.  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  Patient feels well overall, he has started ozempic for diabetes, he has had a little bit of postprandial nausea and decreased appetite on the medication.  He has lost some weight. Overall he is tolerating the medication well  PMH: Smoking status noted ROS: Per HPI  Objective: BP 116/68   Pulse 81   Temp (!) 97.4 F (36.3 C) (Oral)   Ht 6' (1.829 m)   Wt 194 lb 6.4 oz (88.2 kg)   BMI 26.37 kg/m  Gen: NAD, alert, cooperative with exam HEENT: NCAT, EOMI, PERRL CV: RRR, good S1/S2 Resp: CTABL, no wheezes, non-labored Ext: No edema, warm Neuro: Alert and oriented, No gross deficits  Assessment and plan:  #Squamous cell carcinoma of the lung Patient with tracheal squamous cell carcinoma, he is very happy with his care from thoracic surgery and radiation oncology, appreciate the recommendations Care Patient feels confident in his treatment plan.  #Type 2 diabetes Patient tolerating GLP agonist recently well, continue for now His weight loss could be combination from cancer as well as this GLP which is added. I do believe he needs it for glycemic control, continue Next A1c after 7/16    [name], MD [facility] 12/06/2017, 11:41 AM

## 2017-12-11 ENCOUNTER — Ambulatory Visit
Admission: RE | Admit: 2017-12-11 | Discharge: 2017-12-11 | Disposition: A | Discharge status: Home / Self Care | Payer: Medicare HMO | Source: Ambulatory Visit | Attending: Radiation Oncology | Admitting: Radiation Oncology

## 2017-12-11 ENCOUNTER — Ambulatory Visit: Payer: Medicare HMO | Admitting: Thoracic Surgery (Cardiothoracic Vascular Surgery)

## 2017-12-11 DIAGNOSIS — C3431 Malignant neoplasm of lower lobe, right bronchus or lung: Secondary | ICD-10-CM | POA: Diagnosis not present

## 2017-12-11 DIAGNOSIS — Z510 Encounter for antineoplastic radiation therapy: Secondary | ICD-10-CM | POA: Diagnosis not present

## 2017-12-11 DIAGNOSIS — C3491 Malignant neoplasm of unspecified part of right bronchus or lung: Secondary | ICD-10-CM | POA: Insufficient documentation

## 2017-12-11 NOTE — Progress Notes (Signed)
  Radiation Oncology         [phone] ________________________________  Name: KAICEN DESENA MRN: [id number]  Date: 12/11/2017  DOB: 1940-07-29  SIMULATION AND TREATMENT PLANNING NOTE    ICD-10-CM   1. Squamous cell carcinoma of right lung (HCC) C34.91     DIAGNOSIS: 77 year-old gentleman with recurrent Stage Ia non-small cell carcinoma versus new primary tracheal carcinoma     NARRATIVE:  The patient was brought to the [facility].  Identity was confirmed.  All relevant records and images related to the planned course of therapy were reviewed.  The patient freely provided informed written consent to proceed with treatment after reviewing the details related to the planned course of therapy. The consent form was witnessed and verified by the simulation staff.  Then, the patient was set-up in a stable reproducible  supine position for radiation therapy.  CT images were obtained.  Surface markings were placed.  The CT images were loaded into the planning software.  Then the target and avoidance structures were contoured.  Treatment planning then occurred.  The radiation prescription was entered and confirmed.  Then, I designed and supervised the construction of a total of 5 medically necessary complex treatment devices.  I have requested : 3D Simulation  I have requested a DVH of the following structures: GTV, CTV PTV esophagus, lungs, heart.  I have ordered:dose calc.  PLAN:  The patient will receive 46 Gy in 23 fractions directed at the mediastinal area and area of recurrence/new primary site followed by a boost to the site of recurrence/new primary site of 20 gray in 10 fractions (cumulative 66 gray in 33 fractions).   -----------------------------------  Blair Promise, PhD, MD

## 2017-12-13 DIAGNOSIS — Z510 Encounter for antineoplastic radiation therapy: Secondary | ICD-10-CM | POA: Diagnosis not present

## 2017-12-13 DIAGNOSIS — C3491 Malignant neoplasm of unspecified part of right bronchus or lung: Secondary | ICD-10-CM | POA: Diagnosis not present

## 2017-12-13 DIAGNOSIS — C3431 Malignant neoplasm of lower lobe, right bronchus or lung: Secondary | ICD-10-CM | POA: Diagnosis not present

## 2017-12-18 ENCOUNTER — Ambulatory Visit
Admission: RE | Admit: 2017-12-18 | Discharge: 2017-12-18 | Disposition: A | Discharge status: Home / Self Care | Payer: Medicare HMO | Source: Ambulatory Visit | Attending: Radiation Oncology | Admitting: Radiation Oncology

## 2017-12-18 DIAGNOSIS — Z510 Encounter for antineoplastic radiation therapy: Secondary | ICD-10-CM | POA: Diagnosis not present

## 2017-12-18 DIAGNOSIS — C3491 Malignant neoplasm of unspecified part of right bronchus or lung: Secondary | ICD-10-CM

## 2017-12-18 DIAGNOSIS — C3431 Malignant neoplasm of lower lobe, right bronchus or lung: Secondary | ICD-10-CM | POA: Diagnosis not present

## 2017-12-18 MED ORDER — RADIAPLEXRX EX GEL
Freq: Once | CUTANEOUS | Status: AC
  Administered 2017-12-18: 16:00:00 via TOPICAL

## 2017-12-18 NOTE — Progress Notes (Signed)
Cardiology Office Note   Date:  12/19/2017   ID:  Timothy Guerrero, DOB [birth year], MRN [id number]  PCP:  [name], MD  Cardiologist:   Minus Breeding, MD   Chief Complaint  Patient presents with  . Coronary Artery Disease      History of Present Illness: Timothy Guerrero is a 77 y.o. male who presents for follow-up of atrial flutter.  Atrial Fibrillation Clinic did not think ablation was indicated. In the past he's had nonobstructive disease and small vessel disease with high-grade stenosis and a nondominant right coronary artery on cath in 2013 in the [place]. He had a stress test in 2015 which was negative. He's been managed medically. He's had a well preserved ejection fraction.  He has had continued problems with HTN and has had adjustments to his meds. He has been anemic. He was on anticoagulation at that time and this was stopped. He actually required transfusion.   An echo in December 2017 demonstrated an EF of 25 - 30%.   Perfusion study demonstrated an EF of 35% with a fixed medium sized mid inferior perfusion defect.  He has remained off of anticoagulation because of his bleed.   He refused atrial flutter ablation.  Since I last saw him he has been found to have recurrent small cell cancer on his trachea.  Yesterday he started radiation therapy.  The patient denies any new symptoms such as chest discomfort, neck or arm discomfort. There has been no new shortness of breath, PND or orthopnea. There have been no reported palpitations, presyncope or syncope.   Past Medical History:  Diagnosis Date  . Anemia   . Arthritis   . Atrial flutter ([place])    post op following lobectomy  . Benign essential tremor   . Blood transfusion without reported diagnosis   . Cataract   . CHF (congestive heart failure) ([place])   . Colon polyp    2007, polyp with early cancer, one year f/u no residual polyp. next TCS 2010, see PSH. [facility].  [name] COPD (chronic obstructive pulmonary  disease) ([place])   . Diabetes mellitus without complication ([place])    10 years  . GERD (gastroesophageal reflux disease)   . HOH (hard of hearing)   . Hypercholesteremia   . Hypertension    10 years  . Lung cancer ([place])   . Prostate cancer ([place])   . Shortness of breath dyspnea    due to lung mass  . Skin melanoma ([place])   . Sleep apnea    wears CPAP sometimes, cannot tolerate all the time.    Past Surgical History:  Procedure Laterality Date  . bottom lobe of right lung removed  12/17/14  . CARDIAC CATHETERIZATION     no PCI  . CATARACT EXTRACTION Left   . CATARACT EXTRACTION W/PHACO Left 01/03/2016   Procedure: CATARACT EXTRACTION PHACO AND INTRAOCULAR LENS PLACEMENT LEFT EYE CDE=7.78;  Surgeon: Williams Che, MD;  Location: [facility];  Service: Ophthalmology;  Laterality: Left;  . CATARACT EXTRACTION W/PHACO Right 03/20/2016   Procedure: CATARACT EXTRACTION PHACO AND INTRAOCULAR LENS PLACEMENT; CDE:  9.58;  Surgeon: Williams Che, MD;  Location: [facility];  Service: Ophthalmology;  Laterality: Right;  . COLONOSCOPY  08/2008   Dr. Leafy Half, [place]: normal, internal grade 1 hemorrhoids. next TCS 08/2013  . COLONOSCOPY N/A 03/04/2014   Procedure: COLONOSCOPY;  Surgeon: Daneil Dolin, MD;  Location: [facility];  Service: Endoscopy;  Laterality: N/A;  7:30am  . EYE SURGERY    . knee replacements     bilateral. over 10 years ago  . LOBECTOMY Right 12/17/2014   Procedure: RIGHT LOWER LOBECTOMY;  Surgeon: Melrose Nakayama, MD;  Location: [facility];  Service: Thoracic;  Laterality: Right;  . LYMPH NODE DISSECTION Right 12/17/2014   Procedure: LYMPH NODE DISSECTION;  Surgeon: Melrose Nakayama, MD;  Location: [facility];  Service: Thoracic;  Laterality: Right;  . NECK SURGERY     melanoma removed.  [name] PROSTATE SURGERY     prostatectomy, radical  . TOTAL HIP ARTHROPLASTY  2007   left  . total shoulder replacement  2011   left  . VIDEO ASSISTED THORACOSCOPY  (VATS)/WEDGE RESECTION Right 12/17/2014   Procedure: RIGHT VIDEO ASSISTED THORACOSCOPY (VATS);  Surgeon: Melrose Nakayama, MD;  Location: [facility];  Service: Thoracic;  Laterality: Right;  [name] VIDEO BRONCHOSCOPY Bilateral 11/17/2016   Procedure: VIDEO BRONCHOSCOPY WITHOUT FLUORO;  Surgeon: Javier Glazier, MD;  Location: [facility];  Service: Cardiopulmonary;  Laterality: Bilateral;  . VIDEO BRONCHOSCOPY N/A 11/01/2017   Procedure: VIDEO BRONCHOSCOPY;  Surgeon: Melrose Nakayama, MD;  Location: [facility];  Service: Thoracic;  Laterality: N/A;     Current Outpatient Medications  Medication Sig Dispense Refill  . albuterol (PROVENTIL HFA;VENTOLIN HFA) 108 (90 Base) MCG/ACT inhaler Inhale 1 puff into the lungs every 6 (six) hours as needed for wheezing or shortness of breath.    [name] amLODipine (NORVASC) 10 MG tablet Take 1 tablet (10 mg total) by mouth daily. ((blood pressure)) 90 tablet 0  . capsaicin (ZOSTRIX) 0.025 % cream Apply 1 application topically 2 (two) times daily as needed (Foot pain).    . furosemide (LASIX) 20 MG tablet Take 1 tablet (20 mg total) by mouth daily. 90 tablet 3  . glipiZIDE (GLUCOTROL) 10 MG tablet Take 1 tablet (10 mg total) by mouth daily before breakfast. ((for blood sugar)) 90 tablet 3  . Insulin Glargine (LANTUS SOLOSTAR) 100 UNIT/ML Solostar Pen Inject 20-40 Units into the skin daily at 10 pm. (Patient taking differently: Inject 30 Units into the skin every morning. ) 5 pen PRN  . irbesartan (AVAPRO) 300 MG tablet Take 1 tablet (300 mg total) by mouth daily. 90 tablet 3  . levothyroxine (SYNTHROID, LEVOTHROID) 50 MCG tablet Take 1 tablet (50 mcg total) by mouth daily before breakfast. ((for thyroid)) 90 tablet 3  . magnesium oxide (MAG-OX) 400 MG tablet Take 400 mg by mouth 2 (two) times daily.    . metFORMIN (GLUCOPHAGE) 1000 MG tablet Take 1 tablet (1,000 mg total) by mouth 2 (two) times daily with a meal. 180 tablet 0  . metoprolol (TOPROL-XL) 200 MG 24 hr tablet  Take 1 tablet (200 mg total) by mouth daily. 90 tablet 3  . pantoprazole (PROTONIX) 40 MG tablet Take 1 tablet (40 mg total) by mouth daily. 90 tablet 1  . Potassium Chloride ER 20 MEQ TBCR Take 20 mEq by mouth daily. Take only on days when taking lasix 90 tablet 0  . Semaglutide (OZEMPIC) 0.25 or 0.5 MG/DOSE SOPN Inject 0.25 mg into the skin once a week. 9 mL 0  . Tiotropium Bromide-Olodaterol (STIOLTO RESPIMAT IN) Inhale 1 puff into the lungs daily as needed (shortness of breath).    . valACYclovir (VALTREX) 1000 MG tablet Take 1,000 mg by mouth daily as needed (cold sores).      No current facility-administered medications for this visit.  Allergies:   Patient has no known allergies.    ROS:  Please see the history of present illness.   Otherwise, review of systems are positive for none.   All other systems are reviewed and negative.    PHYSICAL EXAM: VS:  BP (!) 162/86   Pulse 88   Ht 6' (1.829 m)   Wt 197 lb (89.4 kg)   BMI 26.72 kg/m  , BMI Body mass index is 26.72 kg/m.  GENERAL:  Well appearing NECK:  No jugular venous distention, waveform within normal limits, carotid upstroke brisk and symmetric, no bruits, no thyromegaly LUNGS:  Clear to auscultation bilaterally CHEST:  Unremarkable HEART:  PMI not displaced or sustained,S1 and S2 within normal limits, no S3, no S4, no clicks, no rubs, no murmurs ABD:  Flat, positive bowel sounds normal in frequency in pitch, no bruits, no rebound, no guarding, no midline pulsatile mass, no hepatomegaly, no splenomegaly EXT:  2 plus pulses throughout, no edema, no cyanosis no clubbing   EKG:  EKG is done today. Sinus rhythm, rate 88, axis within normal limits, intervals within normal limits, no acute ST-T wave changes.  Recent Labs: 07/18/2017: TSH 2.040 11/01/2017: ALT 13; BUN 9; Creatinine, Ser 0.94; Hemoglobin 14.6; Platelets 258; Potassium 4.6; Sodium 142    Lipid Panel    Component Value Date/Time   CHOL 155 07/18/2017 1319    TRIG 186 (H) 07/18/2017 1319   HDL 36 (L) 07/18/2017 1319   CHOLHDL 4.3 07/18/2017 1319   LDLCALC 82 07/18/2017 1319      Wt Readings from Last 3 Encounters:  12/19/17 197 lb (89.4 kg)  12/06/17 194 lb 6.4 oz (88.2 kg)  12/04/17 200 lb (90.7 kg)     Other studies Reviewed: Additional studies/ records that were reviewed today include:  Hospital records Review of the above records demonstrates:      ASSESSMENT AND PLAN:  ATRIAL FLUTTER: Timothy Guerrero has a CHA2DS2 - VASc score of 4 with a risk of stroke of 4%.  He has refused ablation.  He is too high risk for prophylactic anticoagulants.  Therefore, no change in therapy.  He has not felt any symptomatic tachypalpitations.  HTN:   His blood pressure is high today but it does fluctuate and I reviewed multiple readings.  He is on multiple medications and given what he is going through I am not going to titrate his meds at this point.   CAD:  The patient has no new sypmtoms.  No further cardiovascular testing is indicated.  We will continue with aggressive risk reduction and meds as listed.  CHRONIC SYSTOLIC HF:   I will check another echocardiogram in December as it will have been 2 years.  He has not wanted to switch to [facility] because of cost.  He will remain on the meds as listed.   DM:   His A1c has gone down to 9.1 having been up higher than 10.  I will defer to Dr. [name].   Current medicines are reviewed at length with the patient today.  The patient does have concerns regarding medicines.  The following changes have been made:  None  Labs/ tests ordered today include:   None  Orders Placed This Encounter  Procedures  . EKG 12-Lead  . ECHOCARDIOGRAM COMPLETE     Disposition:   FU with me in 12 months.     Signed, Minus Breeding, MD  12/19/2017 12:50 PM    [facility]

## 2017-12-18 NOTE — Progress Notes (Signed)
Pt here for patient teaching.  Pt given Radiation and You booklet, skin care instructions and Radiaplex gel.  Reviewed areas of pertinence such as fatigue, hair loss, skin changes and throat changes . Pt able to give teach back of to pat skin and use unscented/gentle soap,apply Radiaplex bid, avoid applying anything to skin within 4 hours of treatment and to use an electric razor if they must shave. Pt demonstrated understanding, needs reinforcement, no evidence of learning, refused teaching and  of information given and will contact nursing with any questions or concerns.     [email or web address]/[email or web address]

## 2017-12-19 ENCOUNTER — Ambulatory Visit (INDEPENDENT_AMBULATORY_CARE_PROVIDER_SITE_OTHER): Payer: Medicare HMO | Admitting: Cardiology

## 2017-12-19 ENCOUNTER — Encounter: Payer: Self-pay | Admitting: Cardiology

## 2017-12-19 ENCOUNTER — Ambulatory Visit
Admission: RE | Admit: 2017-12-19 | Discharge: 2017-12-19 | Disposition: A | Discharge status: Home / Self Care | Payer: Medicare HMO | Source: Ambulatory Visit | Attending: Radiation Oncology | Admitting: Radiation Oncology

## 2017-12-19 VITALS — BP 162/86 | HR 88 | Ht 72.0 in | Wt 197.0 lb

## 2017-12-19 DIAGNOSIS — I5022 Chronic systolic (congestive) heart failure: Secondary | ICD-10-CM

## 2017-12-19 DIAGNOSIS — C3491 Malignant neoplasm of unspecified part of right bronchus or lung: Secondary | ICD-10-CM | POA: Diagnosis not present

## 2017-12-19 DIAGNOSIS — I10 Essential (primary) hypertension: Secondary | ICD-10-CM

## 2017-12-19 DIAGNOSIS — I480 Paroxysmal atrial fibrillation: Secondary | ICD-10-CM

## 2017-12-19 DIAGNOSIS — I2510 Atherosclerotic heart disease of native coronary artery without angina pectoris: Secondary | ICD-10-CM | POA: Diagnosis not present

## 2017-12-19 DIAGNOSIS — Z510 Encounter for antineoplastic radiation therapy: Secondary | ICD-10-CM | POA: Diagnosis not present

## 2017-12-19 DIAGNOSIS — C3431 Malignant neoplasm of lower lobe, right bronchus or lung: Secondary | ICD-10-CM | POA: Diagnosis not present

## 2017-12-19 NOTE — Patient Instructions (Signed)
Medication Instructions:  The current medical regimen is effective;  continue present plan and medications.  Testing/Procedures: Your physician has requested that you have an echocardiogram. Echocardiography is a painless test that uses sound waves to create images of your heart. It provides your doctor with information about the size and shape of your heart and how well your heart's chambers and valves are working. This procedure takes approximately one hour. There are no restrictions for this procedure.  Follow-Up: Follow up in 1 year with Dr. Percival Spanish.  You will receive a letter in the mail 2 months before you are due.  Please call us when you receive this letter to schedule your follow up appointment.  If you need a refill on your cardiac medications before your next appointment, please call your pharmacy.  Thank you for choosing [facility]!!

## 2017-12-20 ENCOUNTER — Ambulatory Visit
Admission: RE | Admit: 2017-12-20 | Discharge: 2017-12-20 | Disposition: A | Discharge status: Home / Self Care | Payer: Medicare HMO | Source: Ambulatory Visit | Attending: Radiation Oncology | Admitting: Radiation Oncology

## 2017-12-20 DIAGNOSIS — C3491 Malignant neoplasm of unspecified part of right bronchus or lung: Secondary | ICD-10-CM | POA: Diagnosis not present

## 2017-12-20 DIAGNOSIS — Z510 Encounter for antineoplastic radiation therapy: Secondary | ICD-10-CM | POA: Diagnosis not present

## 2017-12-20 DIAGNOSIS — C3431 Malignant neoplasm of lower lobe, right bronchus or lung: Secondary | ICD-10-CM | POA: Diagnosis not present

## 2017-12-21 ENCOUNTER — Ambulatory Visit
Admission: RE | Admit: 2017-12-21 | Discharge: 2017-12-21 | Disposition: A | Discharge status: Home / Self Care | Payer: Medicare HMO | Source: Ambulatory Visit | Attending: Radiation Oncology | Admitting: Radiation Oncology

## 2017-12-21 DIAGNOSIS — C3431 Malignant neoplasm of lower lobe, right bronchus or lung: Secondary | ICD-10-CM | POA: Diagnosis not present

## 2017-12-21 DIAGNOSIS — C3491 Malignant neoplasm of unspecified part of right bronchus or lung: Secondary | ICD-10-CM | POA: Diagnosis not present

## 2017-12-21 DIAGNOSIS — Z510 Encounter for antineoplastic radiation therapy: Secondary | ICD-10-CM | POA: Diagnosis not present

## 2017-12-24 ENCOUNTER — Ambulatory Visit
Admission: RE | Admit: 2017-12-24 | Discharge: 2017-12-24 | Disposition: A | Discharge status: Home / Self Care | Payer: Medicare HMO | Source: Ambulatory Visit | Attending: Radiation Oncology | Admitting: Radiation Oncology

## 2017-12-24 DIAGNOSIS — C3431 Malignant neoplasm of lower lobe, right bronchus or lung: Secondary | ICD-10-CM | POA: Diagnosis not present

## 2017-12-24 DIAGNOSIS — Z510 Encounter for antineoplastic radiation therapy: Secondary | ICD-10-CM | POA: Diagnosis not present

## 2017-12-24 DIAGNOSIS — C3491 Malignant neoplasm of unspecified part of right bronchus or lung: Secondary | ICD-10-CM | POA: Diagnosis not present

## 2017-12-25 ENCOUNTER — Ambulatory Visit
Admission: RE | Admit: 2017-12-25 | Discharge: 2017-12-25 | Disposition: A | Discharge status: Home / Self Care | Payer: Medicare HMO | Source: Ambulatory Visit | Attending: Radiation Oncology | Admitting: Radiation Oncology

## 2017-12-25 DIAGNOSIS — C3491 Malignant neoplasm of unspecified part of right bronchus or lung: Secondary | ICD-10-CM | POA: Diagnosis not present

## 2017-12-25 DIAGNOSIS — C3431 Malignant neoplasm of lower lobe, right bronchus or lung: Secondary | ICD-10-CM | POA: Diagnosis not present

## 2017-12-25 DIAGNOSIS — Z510 Encounter for antineoplastic radiation therapy: Secondary | ICD-10-CM | POA: Diagnosis not present

## 2017-12-26 ENCOUNTER — Ambulatory Visit
Admission: RE | Admit: 2017-12-26 | Discharge: 2017-12-26 | Disposition: A | Discharge status: Home / Self Care | Payer: Medicare HMO | Source: Ambulatory Visit | Attending: Radiation Oncology | Admitting: Radiation Oncology

## 2017-12-26 DIAGNOSIS — Z510 Encounter for antineoplastic radiation therapy: Secondary | ICD-10-CM | POA: Diagnosis not present

## 2017-12-26 DIAGNOSIS — C3491 Malignant neoplasm of unspecified part of right bronchus or lung: Secondary | ICD-10-CM | POA: Diagnosis not present

## 2017-12-26 DIAGNOSIS — C3431 Malignant neoplasm of lower lobe, right bronchus or lung: Secondary | ICD-10-CM | POA: Diagnosis not present

## 2017-12-27 ENCOUNTER — Ambulatory Visit
Admission: RE | Admit: 2017-12-27 | Discharge: 2017-12-27 | Disposition: A | Discharge status: Home / Self Care | Payer: Medicare HMO | Source: Ambulatory Visit | Attending: Radiation Oncology | Admitting: Radiation Oncology

## 2017-12-27 DIAGNOSIS — C3431 Malignant neoplasm of lower lobe, right bronchus or lung: Secondary | ICD-10-CM | POA: Diagnosis not present

## 2017-12-27 DIAGNOSIS — C3491 Malignant neoplasm of unspecified part of right bronchus or lung: Secondary | ICD-10-CM | POA: Diagnosis not present

## 2017-12-27 DIAGNOSIS — Z510 Encounter for antineoplastic radiation therapy: Secondary | ICD-10-CM | POA: Diagnosis not present

## 2017-12-28 ENCOUNTER — Ambulatory Visit
Admission: RE | Admit: 2017-12-28 | Discharge: 2017-12-28 | Disposition: A | Discharge status: Home / Self Care | Payer: Medicare HMO | Source: Ambulatory Visit | Attending: Radiation Oncology | Admitting: Radiation Oncology

## 2017-12-28 DIAGNOSIS — C3431 Malignant neoplasm of lower lobe, right bronchus or lung: Secondary | ICD-10-CM | POA: Diagnosis not present

## 2017-12-28 DIAGNOSIS — Z510 Encounter for antineoplastic radiation therapy: Secondary | ICD-10-CM | POA: Diagnosis not present

## 2017-12-28 DIAGNOSIS — C3491 Malignant neoplasm of unspecified part of right bronchus or lung: Secondary | ICD-10-CM | POA: Diagnosis not present

## 2017-12-29 ENCOUNTER — Other Ambulatory Visit: Payer: Self-pay | Admitting: Family Medicine

## 2017-12-31 ENCOUNTER — Ambulatory Visit
Admission: RE | Admit: 2017-12-31 | Discharge: 2017-12-31 | Disposition: A | Discharge status: Home / Self Care | Payer: Medicare HMO | Source: Ambulatory Visit | Attending: Radiation Oncology | Admitting: Radiation Oncology

## 2017-12-31 DIAGNOSIS — C3431 Malignant neoplasm of lower lobe, right bronchus or lung: Secondary | ICD-10-CM | POA: Diagnosis not present

## 2017-12-31 DIAGNOSIS — C3491 Malignant neoplasm of unspecified part of right bronchus or lung: Secondary | ICD-10-CM | POA: Insufficient documentation

## 2017-12-31 DIAGNOSIS — Z510 Encounter for antineoplastic radiation therapy: Secondary | ICD-10-CM | POA: Insufficient documentation

## 2018-01-01 ENCOUNTER — Other Ambulatory Visit: Payer: Self-pay

## 2018-01-01 ENCOUNTER — Ambulatory Visit
Admission: RE | Admit: 2018-01-01 | Discharge: 2018-01-01 | Disposition: A | Discharge status: Home / Self Care | Payer: Medicare HMO | Source: Ambulatory Visit | Attending: Radiation Oncology | Admitting: Radiation Oncology

## 2018-01-01 ENCOUNTER — Other Ambulatory Visit: Payer: Self-pay | Admitting: Radiation Oncology

## 2018-01-01 DIAGNOSIS — C3491 Malignant neoplasm of unspecified part of right bronchus or lung: Secondary | ICD-10-CM

## 2018-01-01 DIAGNOSIS — Z510 Encounter for antineoplastic radiation therapy: Secondary | ICD-10-CM | POA: Diagnosis not present

## 2018-01-01 DIAGNOSIS — C3431 Malignant neoplasm of lower lobe, right bronchus or lung: Secondary | ICD-10-CM | POA: Diagnosis not present

## 2018-01-02 ENCOUNTER — Other Ambulatory Visit: Payer: Self-pay | Admitting: Radiation Oncology

## 2018-01-02 ENCOUNTER — Ambulatory Visit: Payer: Medicare HMO

## 2018-01-02 ENCOUNTER — Ambulatory Visit
Admission: RE | Admit: 2018-01-02 | Discharge: 2018-01-02 | Disposition: A | Discharge status: Home / Self Care | Payer: Medicare HMO | Source: Ambulatory Visit | Attending: Radiation Oncology | Admitting: Radiation Oncology

## 2018-01-02 DIAGNOSIS — Z510 Encounter for antineoplastic radiation therapy: Secondary | ICD-10-CM | POA: Diagnosis not present

## 2018-01-02 DIAGNOSIS — C3431 Malignant neoplasm of lower lobe, right bronchus or lung: Secondary | ICD-10-CM | POA: Diagnosis not present

## 2018-01-02 DIAGNOSIS — C3491 Malignant neoplasm of unspecified part of right bronchus or lung: Secondary | ICD-10-CM | POA: Diagnosis not present

## 2018-01-02 MED ORDER — SUCRALFATE 1 GM/10ML PO SUSP: 1.0000 g | Freq: Three times a day (TID) | ORAL | 0 refills | Status: DC

## 2018-01-04 ENCOUNTER — Ambulatory Visit
Admission: RE | Admit: 2018-01-04 | Discharge: 2018-01-04 | Disposition: A | Discharge status: Home / Self Care | Payer: Medicare HMO | Source: Ambulatory Visit | Attending: Radiation Oncology | Admitting: Radiation Oncology

## 2018-01-04 DIAGNOSIS — C3491 Malignant neoplasm of unspecified part of right bronchus or lung: Secondary | ICD-10-CM | POA: Diagnosis not present

## 2018-01-04 DIAGNOSIS — C3431 Malignant neoplasm of lower lobe, right bronchus or lung: Secondary | ICD-10-CM | POA: Diagnosis not present

## 2018-01-04 DIAGNOSIS — Z510 Encounter for antineoplastic radiation therapy: Secondary | ICD-10-CM | POA: Diagnosis not present

## 2018-01-07 ENCOUNTER — Ambulatory Visit
Admission: RE | Admit: 2018-01-07 | Discharge: 2018-01-07 | Disposition: A | Discharge status: Home / Self Care | Payer: Medicare HMO | Source: Ambulatory Visit | Attending: Radiation Oncology | Admitting: Radiation Oncology

## 2018-01-07 ENCOUNTER — Telehealth: Payer: Self-pay | Admitting: Radiation Oncology

## 2018-01-07 DIAGNOSIS — Z510 Encounter for antineoplastic radiation therapy: Secondary | ICD-10-CM | POA: Diagnosis not present

## 2018-01-07 DIAGNOSIS — C3491 Malignant neoplasm of unspecified part of right bronchus or lung: Secondary | ICD-10-CM | POA: Diagnosis not present

## 2018-01-07 DIAGNOSIS — C3431 Malignant neoplasm of lower lobe, right bronchus or lung: Secondary | ICD-10-CM | POA: Diagnosis not present

## 2018-01-07 NOTE — Telephone Encounter (Signed)
LVM for Mr. Ellithorpe regarding scheduled Head CT on 7/12 at 1:45.

## 2018-01-08 ENCOUNTER — Other Ambulatory Visit: Payer: Self-pay | Admitting: Radiation Oncology

## 2018-01-08 ENCOUNTER — Ambulatory Visit
Admission: RE | Admit: 2018-01-08 | Discharge: 2018-01-08 | Disposition: A | Discharge status: Home / Self Care | Payer: Medicare HMO | Source: Ambulatory Visit | Attending: Radiation Oncology | Admitting: Radiation Oncology

## 2018-01-08 ENCOUNTER — Telehealth: Payer: Self-pay | Admitting: Radiation Oncology

## 2018-01-08 DIAGNOSIS — C3491 Malignant neoplasm of unspecified part of right bronchus or lung: Secondary | ICD-10-CM | POA: Diagnosis not present

## 2018-01-08 DIAGNOSIS — C3431 Malignant neoplasm of lower lobe, right bronchus or lung: Secondary | ICD-10-CM | POA: Diagnosis not present

## 2018-01-08 DIAGNOSIS — Z510 Encounter for antineoplastic radiation therapy: Secondary | ICD-10-CM | POA: Diagnosis not present

## 2018-01-08 MED ORDER — LORAZEPAM 1 MG PO TABS: 1.0000 mg | ORAL_TABLET | Freq: Three times a day (TID) | ORAL | 0 refills | Status: DC

## 2018-01-08 NOTE — Telephone Encounter (Signed)
Resch. Pt head CT on 7/16 to be done at 11:00. Mr. Gaertner agreeable.

## 2018-01-09 ENCOUNTER — Ambulatory Visit
Admission: RE | Admit: 2018-01-09 | Discharge: 2018-01-09 | Disposition: A | Discharge status: Home / Self Care | Payer: Medicare HMO | Source: Ambulatory Visit | Attending: Radiation Oncology | Admitting: Radiation Oncology

## 2018-01-09 DIAGNOSIS — Z510 Encounter for antineoplastic radiation therapy: Secondary | ICD-10-CM | POA: Diagnosis not present

## 2018-01-09 DIAGNOSIS — C3431 Malignant neoplasm of lower lobe, right bronchus or lung: Secondary | ICD-10-CM | POA: Diagnosis not present

## 2018-01-09 DIAGNOSIS — C3491 Malignant neoplasm of unspecified part of right bronchus or lung: Secondary | ICD-10-CM | POA: Diagnosis not present

## 2018-01-10 ENCOUNTER — Ambulatory Visit
Admission: RE | Admit: 2018-01-10 | Discharge: 2018-01-10 | Disposition: A | Discharge status: Home / Self Care | Payer: Medicare HMO | Source: Ambulatory Visit | Attending: Radiation Oncology | Admitting: Radiation Oncology

## 2018-01-10 DIAGNOSIS — Z510 Encounter for antineoplastic radiation therapy: Secondary | ICD-10-CM | POA: Diagnosis not present

## 2018-01-10 DIAGNOSIS — C3431 Malignant neoplasm of lower lobe, right bronchus or lung: Secondary | ICD-10-CM | POA: Diagnosis not present

## 2018-01-10 DIAGNOSIS — C3491 Malignant neoplasm of unspecified part of right bronchus or lung: Secondary | ICD-10-CM | POA: Diagnosis not present

## 2018-01-11 ENCOUNTER — Ambulatory Visit
Admission: RE | Admit: 2018-01-11 | Discharge: 2018-01-11 | Disposition: A | Discharge status: Home / Self Care | Payer: Medicare HMO | Source: Ambulatory Visit | Attending: Radiation Oncology | Admitting: Radiation Oncology

## 2018-01-11 ENCOUNTER — Ambulatory Visit (HOSPITAL_COMMUNITY): Payer: Medicare HMO

## 2018-01-11 DIAGNOSIS — C3491 Malignant neoplasm of unspecified part of right bronchus or lung: Secondary | ICD-10-CM | POA: Diagnosis not present

## 2018-01-11 DIAGNOSIS — C3431 Malignant neoplasm of lower lobe, right bronchus or lung: Secondary | ICD-10-CM | POA: Diagnosis not present

## 2018-01-11 DIAGNOSIS — Z510 Encounter for antineoplastic radiation therapy: Secondary | ICD-10-CM | POA: Diagnosis not present

## 2018-01-14 ENCOUNTER — Ambulatory Visit
Admission: RE | Admit: 2018-01-14 | Discharge: 2018-01-14 | Disposition: A | Discharge status: Home / Self Care | Payer: Medicare HMO | Source: Ambulatory Visit | Attending: Radiation Oncology | Admitting: Radiation Oncology

## 2018-01-14 DIAGNOSIS — Z510 Encounter for antineoplastic radiation therapy: Secondary | ICD-10-CM | POA: Diagnosis not present

## 2018-01-14 DIAGNOSIS — C3491 Malignant neoplasm of unspecified part of right bronchus or lung: Secondary | ICD-10-CM | POA: Diagnosis not present

## 2018-01-14 DIAGNOSIS — C3431 Malignant neoplasm of lower lobe, right bronchus or lung: Secondary | ICD-10-CM | POA: Diagnosis not present

## 2018-01-15 ENCOUNTER — Ambulatory Visit
Admission: RE | Admit: 2018-01-15 | Discharge: 2018-01-15 | Disposition: A | Discharge status: Home / Self Care | Payer: Medicare HMO | Source: Ambulatory Visit | Attending: Radiation Oncology | Admitting: Radiation Oncology

## 2018-01-15 ENCOUNTER — Ambulatory Visit
Admission: RE | Admit: 2018-01-15 | Payer: Medicare HMO | Source: Ambulatory Visit | Attending: Radiation Oncology | Admitting: Radiation Oncology

## 2018-01-15 ENCOUNTER — Encounter (HOSPITAL_COMMUNITY): Payer: Self-pay

## 2018-01-15 ENCOUNTER — Telehealth: Payer: Self-pay | Admitting: *Deleted

## 2018-01-15 ENCOUNTER — Other Ambulatory Visit: Payer: Self-pay

## 2018-01-15 ENCOUNTER — Ambulatory Visit (HOSPITAL_COMMUNITY)
Admission: RE | Admit: 2018-01-15 | Discharge: 2018-01-15 | Disposition: A | Discharge status: Home / Self Care | Payer: Medicare HMO | Source: Ambulatory Visit | Attending: Radiation Oncology | Admitting: Radiation Oncology

## 2018-01-15 DIAGNOSIS — C3431 Malignant neoplasm of lower lobe, right bronchus or lung: Secondary | ICD-10-CM | POA: Diagnosis not present

## 2018-01-15 DIAGNOSIS — C3491 Malignant neoplasm of unspecified part of right bronchus or lung: Secondary | ICD-10-CM

## 2018-01-15 DIAGNOSIS — Z510 Encounter for antineoplastic radiation therapy: Secondary | ICD-10-CM | POA: Diagnosis not present

## 2018-01-15 DIAGNOSIS — J984 Other disorders of lung: Secondary | ICD-10-CM

## 2018-01-15 MED ORDER — SONAFINE EX EMUL
1.0000 "application " | Freq: Two times a day (BID) | CUTANEOUS | Status: DC
  Administered 2018-01-15: 1 via TOPICAL

## 2018-01-15 NOTE — Telephone Encounter (Signed)
Called patient to inform of stat labs on 01-18-18 - arrival time - 9:15 am @ [facility] and his scan on 01-18-18 - arrival time - 10:45 am @ [facility], no  Restrictions to test, pt. To hold metformin per Dr. Clabe Seal nurse on Friday and he will have labs on 01-21-18, and then resume metformin on 01-21-18, after nurse reviews labs and calls him, spoke with patient and he verified understanding this

## 2018-01-16 ENCOUNTER — Ambulatory Visit
Admission: RE | Admit: 2018-01-16 | Discharge: 2018-01-16 | Disposition: A | Discharge status: Home / Self Care | Payer: Medicare HMO | Source: Ambulatory Visit | Attending: Radiation Oncology | Admitting: Radiation Oncology

## 2018-01-16 ENCOUNTER — Other Ambulatory Visit: Payer: Medicare HMO

## 2018-01-16 DIAGNOSIS — C3491 Malignant neoplasm of unspecified part of right bronchus or lung: Secondary | ICD-10-CM | POA: Diagnosis not present

## 2018-01-16 DIAGNOSIS — Z510 Encounter for antineoplastic radiation therapy: Secondary | ICD-10-CM | POA: Diagnosis not present

## 2018-01-16 DIAGNOSIS — E1159 Type 2 diabetes mellitus with other circulatory complications: Secondary | ICD-10-CM

## 2018-01-16 DIAGNOSIS — C3431 Malignant neoplasm of lower lobe, right bronchus or lung: Secondary | ICD-10-CM | POA: Diagnosis not present

## 2018-01-16 LAB — BAYER DCA HB A1C WAIVED: HB A1C (BAYER DCA - WAIVED): 8.2 % — ABNORMAL HIGH (ref <7.0)

## 2018-01-17 ENCOUNTER — Ambulatory Visit
Admission: RE | Admit: 2018-01-17 | Discharge: 2018-01-17 | Disposition: A | Discharge status: Home / Self Care | Payer: Medicare HMO | Source: Ambulatory Visit | Attending: Radiation Oncology | Admitting: Radiation Oncology

## 2018-01-17 ENCOUNTER — Other Ambulatory Visit: Payer: Self-pay | Admitting: Family

## 2018-01-17 DIAGNOSIS — C3491 Malignant neoplasm of unspecified part of right bronchus or lung: Secondary | ICD-10-CM | POA: Diagnosis not present

## 2018-01-17 DIAGNOSIS — C3431 Malignant neoplasm of lower lobe, right bronchus or lung: Secondary | ICD-10-CM | POA: Diagnosis not present

## 2018-01-17 DIAGNOSIS — Z510 Encounter for antineoplastic radiation therapy: Secondary | ICD-10-CM | POA: Diagnosis not present

## 2018-01-18 ENCOUNTER — Ambulatory Visit
Admission: RE | Admit: 2018-01-18 | Discharge: 2018-01-18 | Disposition: A | Discharge status: Home / Self Care | Payer: Medicare HMO | Source: Ambulatory Visit | Attending: Radiation Oncology | Admitting: Radiation Oncology

## 2018-01-18 ENCOUNTER — Ambulatory Visit (HOSPITAL_COMMUNITY)
Admission: RE | Admit: 2018-01-18 | Discharge: 2018-01-18 | Disposition: A | Discharge status: Home / Self Care | Payer: Medicare HMO | Source: Ambulatory Visit | Attending: Radiation Oncology | Admitting: Radiation Oncology

## 2018-01-18 ENCOUNTER — Other Ambulatory Visit: Payer: Self-pay | Admitting: Family

## 2018-01-18 DIAGNOSIS — C3491 Malignant neoplasm of unspecified part of right bronchus or lung: Secondary | ICD-10-CM

## 2018-01-18 DIAGNOSIS — Z510 Encounter for antineoplastic radiation therapy: Secondary | ICD-10-CM | POA: Diagnosis not present

## 2018-01-18 DIAGNOSIS — C3431 Malignant neoplasm of lower lobe, right bronchus or lung: Secondary | ICD-10-CM | POA: Diagnosis not present

## 2018-01-18 DIAGNOSIS — R51 Headache: Secondary | ICD-10-CM | POA: Diagnosis not present

## 2018-01-18 LAB — BUN & CREATININE (CHCC)
BUN: 18 mg/dL (ref 8–23)
Creatinine: 0.99 mg/dL (ref 0.61–1.24)
GFR, Est AFR Am: >60 mL/min (ref >60)
GFR, Estimated: >60 mL/min (ref >60)

## 2018-01-18 MED ORDER — SEMAGLUTIDE(0.25 OR 0.5MG/DOS) 2 MG/1.5ML ~~LOC~~ SOPN: 0.5000 mg | PEN_INJECTOR | SUBCUTANEOUS | 1 refills | Status: DC

## 2018-01-18 MED ORDER — IOHEXOL 300 MG/ML  SOLN
75.0000 mL | Freq: Once | INTRAMUSCULAR | Status: AC | PRN
  Administered 2018-01-18: 75 mL via INTRAVENOUS

## 2018-01-19 ENCOUNTER — Ambulatory Visit (INDEPENDENT_AMBULATORY_CARE_PROVIDER_SITE_OTHER): Payer: Medicare HMO | Admitting: Family Medicine

## 2018-01-19 VITALS — BP 108/64 | HR 107 | Temp 97.8°F | Ht 72.0 in | Wt 196.0 lb

## 2018-01-19 DIAGNOSIS — W57XXXA Bitten or stung by nonvenomous insect and other nonvenomous arthropods, initial encounter: Secondary | ICD-10-CM | POA: Diagnosis not present

## 2018-01-19 DIAGNOSIS — S30861A Insect bite (nonvenomous) of abdominal wall, initial encounter: Secondary | ICD-10-CM | POA: Diagnosis not present

## 2018-01-19 MED ORDER — TRIAMCINOLONE ACETONIDE 0.1 % EX CREA: 1.0000 "application " | TOPICAL_CREAM | Freq: Two times a day (BID) | CUTANEOUS | 0 refills | Status: AC

## 2018-01-19 NOTE — Patient Instructions (Signed)
Insect Bite, Adult An insect bite can make your skin red, itchy, and swollen. Some insects can spread disease to people with a bite. However, most insect bites do not lead to disease, and most are not serious. Follow these instructions at home: Bite area care  Do not scratch the bite area.  Keep the bite area clean and dry.  Wash the bite area every day with soap and water as told by your doctor.  Check the bite area every day for signs of infection. Check for: ? More redness, swelling, or pain. ? Fluid or blood. ? Warmth. ? Pus. Managing pain, itching, and swelling  You may put any of these on the bite area as told by your doctor: ? A baking soda paste. ? Cortisone cream. ? Calamine lotion.  If directed, put ice on the bite area. ? Put ice in a plastic bag. ? Place a towel between your skin and the bag. ? Leave the ice on for 20 minutes, 2-3 times a day. Medicines  Take medicines or put medicines on your skin only as told by your doctor.  If you were prescribed an antibiotic medicine, use it as told by your doctor. Do not stop using the antibiotic even if your condition improves. General instructions  Keep all follow-up visits as told by your doctor. This is important. How is this prevented? To help you have a lower risk of insect bites:  When you are outside, wear clothing that covers your arms and legs.  Use insect repellent. The best insect repellents have: ? An active ingredient of DEET, picaridin, oil of lemon eucalyptus (OLE), or IR3535. ? Higher amounts of DEET or another active ingredient than other repellents have.  If your home windows do not have screens, think about putting some in.  Contact a doctor if:  You have more redness, swelling, or pain in the bite area.  You have fluid, blood, or pus coming from the bite area.  The bite area feels warm.  You have a fever. Get help right away if:  You have joint pain.  You have a rash.  You have  shortness of breath.  You feel more tired or sleepy than you normally do.  You have neck pain.  You have a headache.  You feel weaker than you normally do.  You have chest pain.  You have pain in your belly.  You feel sick to your stomach (nauseous) or you throw up (vomit). Summary  An insect bite can make your skin red, itchy, and swollen.  Do not scratch the bite area, and keep it clean and dry.  Ice can help with pain and itching from the bite. This information is not intended to replace advice given to you by your health care provider. Make sure you discuss any questions you have with your health care provider. Document Released: 06/16/2000 Document Revised: 01/20/2016 Document Reviewed: 11/04/2014 Elsevier Interactive Patient Education  2018 [employer].

## 2018-01-19 NOTE — Progress Notes (Signed)
   HPI  Patient presents today with itchy red bump.  Patient states that it was a little small brown spot last night he woke up this morning with a large red area on his abdomen that he thinks is likely a bug bite.  He describes it as itchy.  No pain. No fever, chills, sweats.  PMH: Smoking status noted ROS: Per HPI  Objective: BP 108/64   Pulse (!) 107   Temp 97.8 F (36.6 C) (Oral)   Ht 6' (1.829 m)   Wt 196 lb (88.9 kg)   BMI 26.58 kg/m  Gen: NAD, alert, cooperative with exam HEENT: NCAT CV: RRR, good S1/S2, no murmur Resp: CTABL, no wheezes, non-labored Ext: No edema, warm Neuro: Alert and oriented, No gross deficits Skin:  Mid abd with 0.5 cm x 1 cm area of erythema with a 6 mm x 5 mm skin nodule centrally located, no fluctuance or induration, no warmth worrisome for cellulitis  Assessment and plan:  #Insect bite Most likely insect bite Discussed supportive care with Kenalog cream Discussed signs of infection and low threshold for follow-up or calling back if these develop.   Meds ordered this encounter  Medications  . triamcinolone cream (KENALOG) 0.1 %    Sig: Apply 1 application topically 2 (two) times daily.    Dispense:  30 g    Refill:  [place], MD [facility] 01/19/2018, 9:38 AM

## 2018-01-21 ENCOUNTER — Ambulatory Visit
Admission: RE | Admit: 2018-01-21 | Discharge: 2018-01-21 | Disposition: A | Discharge status: Home / Self Care | Payer: Medicare HMO | Source: Ambulatory Visit | Attending: Radiation Oncology | Admitting: Radiation Oncology

## 2018-01-21 ENCOUNTER — Telehealth: Payer: Self-pay

## 2018-01-21 DIAGNOSIS — Z510 Encounter for antineoplastic radiation therapy: Secondary | ICD-10-CM | POA: Diagnosis not present

## 2018-01-21 DIAGNOSIS — C3491 Malignant neoplasm of unspecified part of right bronchus or lung: Secondary | ICD-10-CM

## 2018-01-21 DIAGNOSIS — C3431 Malignant neoplasm of lower lobe, right bronchus or lung: Secondary | ICD-10-CM | POA: Diagnosis not present

## 2018-01-21 LAB — BUN & CREATININE (CHCC)
BUN: 12 mg/dL (ref 8–23)
Creatinine: 0.95 mg/dL (ref 0.61–1.24)
GFR, Est AFR Am: >60 mL/min (ref >60)
GFR, Estimated: >60 mL/min (ref >60)

## 2018-01-21 NOTE — Telephone Encounter (Signed)
This RN visited with pt in [place] [facility] waiting room to remind pt to go to lab upstairs after treatment. Reminded pt that labs were to determine if Metformin could be restarted. Conveyed to pt that, per Dr. Sondra Come, CT head with good results, no evidence of metastatic disease. Conveyed to pt that this RN would contact him with lab results and directions for Metformin once results were available. Pt verbalized understanding of above conversation and agreement. Loma Sousa, RN BSN

## 2018-01-22 ENCOUNTER — Ambulatory Visit
Admission: RE | Admit: 2018-01-22 | Discharge: 2018-01-22 | Disposition: A | Discharge status: Home / Self Care | Payer: Medicare HMO | Source: Ambulatory Visit | Attending: Radiation Oncology | Admitting: Radiation Oncology

## 2018-01-22 DIAGNOSIS — C3491 Malignant neoplasm of unspecified part of right bronchus or lung: Secondary | ICD-10-CM | POA: Diagnosis not present

## 2018-01-22 DIAGNOSIS — C3431 Malignant neoplasm of lower lobe, right bronchus or lung: Secondary | ICD-10-CM | POA: Diagnosis not present

## 2018-01-22 DIAGNOSIS — Z510 Encounter for antineoplastic radiation therapy: Secondary | ICD-10-CM | POA: Diagnosis not present

## 2018-01-23 ENCOUNTER — Other Ambulatory Visit: Payer: Self-pay | Admitting: Family Medicine

## 2018-01-23 ENCOUNTER — Ambulatory Visit
Admission: RE | Admit: 2018-01-23 | Discharge: 2018-01-23 | Disposition: A | Discharge status: Home / Self Care | Payer: Medicare HMO | Source: Ambulatory Visit | Attending: Radiation Oncology | Admitting: Radiation Oncology

## 2018-01-23 DIAGNOSIS — C3491 Malignant neoplasm of unspecified part of right bronchus or lung: Secondary | ICD-10-CM | POA: Diagnosis not present

## 2018-01-23 DIAGNOSIS — C3431 Malignant neoplasm of lower lobe, right bronchus or lung: Secondary | ICD-10-CM | POA: Diagnosis not present

## 2018-01-23 DIAGNOSIS — Z510 Encounter for antineoplastic radiation therapy: Secondary | ICD-10-CM | POA: Diagnosis not present

## 2018-01-24 ENCOUNTER — Ambulatory Visit
Admission: RE | Admit: 2018-01-24 | Discharge: 2018-01-24 | Disposition: A | Discharge status: Home / Self Care | Payer: Medicare HMO | Source: Ambulatory Visit | Attending: Radiation Oncology | Admitting: Radiation Oncology

## 2018-01-24 DIAGNOSIS — C3431 Malignant neoplasm of lower lobe, right bronchus or lung: Secondary | ICD-10-CM | POA: Diagnosis not present

## 2018-01-24 DIAGNOSIS — C3491 Malignant neoplasm of unspecified part of right bronchus or lung: Secondary | ICD-10-CM | POA: Diagnosis not present

## 2018-01-24 DIAGNOSIS — Z510 Encounter for antineoplastic radiation therapy: Secondary | ICD-10-CM | POA: Diagnosis not present

## 2018-01-25 ENCOUNTER — Ambulatory Visit
Admission: RE | Admit: 2018-01-25 | Discharge: 2018-01-25 | Disposition: A | Discharge status: Home / Self Care | Payer: Medicare HMO | Source: Ambulatory Visit | Attending: Radiation Oncology | Admitting: Radiation Oncology

## 2018-01-25 DIAGNOSIS — C3491 Malignant neoplasm of unspecified part of right bronchus or lung: Secondary | ICD-10-CM | POA: Diagnosis not present

## 2018-01-25 DIAGNOSIS — Z510 Encounter for antineoplastic radiation therapy: Secondary | ICD-10-CM | POA: Diagnosis not present

## 2018-01-25 DIAGNOSIS — C3431 Malignant neoplasm of lower lobe, right bronchus or lung: Secondary | ICD-10-CM | POA: Diagnosis not present

## 2018-01-28 ENCOUNTER — Telehealth: Payer: Self-pay | Admitting: *Deleted

## 2018-01-28 ENCOUNTER — Ambulatory Visit
Admission: RE | Admit: 2018-01-28 | Discharge: 2018-01-28 | Disposition: A | Discharge status: Home / Self Care | Payer: Medicare HMO | Source: Ambulatory Visit | Attending: Radiation Oncology | Admitting: Radiation Oncology

## 2018-01-28 DIAGNOSIS — Z510 Encounter for antineoplastic radiation therapy: Secondary | ICD-10-CM | POA: Diagnosis not present

## 2018-01-28 DIAGNOSIS — C3431 Malignant neoplasm of lower lobe, right bronchus or lung: Secondary | ICD-10-CM | POA: Diagnosis not present

## 2018-01-28 DIAGNOSIS — C3491 Malignant neoplasm of unspecified part of right bronchus or lung: Secondary | ICD-10-CM | POA: Diagnosis not present

## 2018-01-28 NOTE — Telephone Encounter (Signed)
Patient can not get ozempic or any medication through their mail order. Must use [facility] services when retired.

## 2018-01-29 ENCOUNTER — Ambulatory Visit
Admission: RE | Admit: 2018-01-29 | Discharge: 2018-01-29 | Disposition: A | Discharge status: Home / Self Care | Payer: Medicare HMO | Source: Ambulatory Visit | Attending: Radiation Oncology | Admitting: Radiation Oncology

## 2018-01-29 DIAGNOSIS — C3431 Malignant neoplasm of lower lobe, right bronchus or lung: Secondary | ICD-10-CM | POA: Diagnosis not present

## 2018-01-29 DIAGNOSIS — C3491 Malignant neoplasm of unspecified part of right bronchus or lung: Secondary | ICD-10-CM

## 2018-01-29 DIAGNOSIS — Z510 Encounter for antineoplastic radiation therapy: Secondary | ICD-10-CM | POA: Diagnosis not present

## 2018-01-29 MED ORDER — SONAFINE EX EMUL
1.0000 "application " | Freq: Two times a day (BID) | CUTANEOUS | Status: DC
  Administered 2018-01-29: 1 via TOPICAL

## 2018-01-30 ENCOUNTER — Ambulatory Visit
Admission: RE | Admit: 2018-01-30 | Discharge: 2018-01-30 | Disposition: A | Discharge status: Home / Self Care | Payer: Medicare HMO | Source: Ambulatory Visit | Attending: Radiation Oncology | Admitting: Radiation Oncology

## 2018-01-30 DIAGNOSIS — C3431 Malignant neoplasm of lower lobe, right bronchus or lung: Secondary | ICD-10-CM | POA: Diagnosis not present

## 2018-01-30 DIAGNOSIS — Z510 Encounter for antineoplastic radiation therapy: Secondary | ICD-10-CM | POA: Diagnosis not present

## 2018-01-30 DIAGNOSIS — C3491 Malignant neoplasm of unspecified part of right bronchus or lung: Secondary | ICD-10-CM | POA: Diagnosis not present

## 2018-01-31 ENCOUNTER — Encounter: Payer: Self-pay | Admitting: Radiation Oncology

## 2018-01-31 ENCOUNTER — Ambulatory Visit
Admission: RE | Admit: 2018-01-31 | Discharge: 2018-01-31 | Disposition: A | Discharge status: Home / Self Care | Payer: Medicare HMO | Source: Ambulatory Visit | Attending: Radiation Oncology | Admitting: Radiation Oncology

## 2018-01-31 DIAGNOSIS — C3491 Malignant neoplasm of unspecified part of right bronchus or lung: Secondary | ICD-10-CM | POA: Insufficient documentation

## 2018-01-31 DIAGNOSIS — Z510 Encounter for antineoplastic radiation therapy: Secondary | ICD-10-CM | POA: Diagnosis not present

## 2018-01-31 DIAGNOSIS — C3431 Malignant neoplasm of lower lobe, right bronchus or lung: Secondary | ICD-10-CM | POA: Diagnosis not present

## 2018-02-01 ENCOUNTER — Ambulatory Visit
Admission: RE | Admit: 2018-02-01 | Discharge: 2018-02-01 | Disposition: A | Discharge status: Home / Self Care | Payer: Medicare HMO | Source: Ambulatory Visit | Attending: Radiation Oncology | Admitting: Radiation Oncology

## 2018-02-01 DIAGNOSIS — Z510 Encounter for antineoplastic radiation therapy: Secondary | ICD-10-CM | POA: Diagnosis not present

## 2018-02-01 DIAGNOSIS — C3431 Malignant neoplasm of lower lobe, right bronchus or lung: Secondary | ICD-10-CM | POA: Diagnosis not present

## 2018-02-01 DIAGNOSIS — C3491 Malignant neoplasm of unspecified part of right bronchus or lung: Secondary | ICD-10-CM | POA: Diagnosis not present

## 2018-02-06 NOTE — Progress Notes (Signed)
  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 The Chest was treated to 46 Gy in 23 fractions of 2 Gy. 2. The Chest was boosted to 20 Gy in 10 fractions of 2 Gy. (66 Gy)  Beams/energy:    1. 3D // 10X, 6X 2. 3D // 10X, 6X  Narrative: The patient tolerated radiation treatment relatively well.   Reports fatigue unchanged/moderate. Denies hemoptysis. Reports cough with phlegm production after much effort. Denies difficulty swallowing. Reports pain and itching on back, in radiation field. Patient states he is using Sonafine.   Plan: The patient has completed radiation treatment. The patient will return to radiation oncology clinic for routine followup in one month. I advised them to call or return sooner if they have any questions or concerns related to their recovery or treatment.  -----------------------------------  Blair Promise, PhD, MD  This document serves as a record of services personally performed by Gery Pray, MD. It was created on his behalf by [name], a trained medical scribe. The creation of this record is based on the scribe's personal observations and the provider's statements to them. This document has been checked and approved by the attending provider.

## 2018-02-11 ENCOUNTER — Other Ambulatory Visit: Payer: Self-pay

## 2018-02-11 MED ORDER — IRBESARTAN 300 MG PO TABS: 300.0000 mg | ORAL_TABLET | Freq: Every day | ORAL | 1 refills | Status: DC

## 2018-03-07 ENCOUNTER — Encounter: Payer: Self-pay | Admitting: Radiation Oncology

## 2018-03-07 ENCOUNTER — Other Ambulatory Visit: Payer: Self-pay

## 2018-03-07 ENCOUNTER — Ambulatory Visit
Admission: RE | Admit: 2018-03-07 | Discharge: 2018-03-07 | Disposition: A | Discharge status: Home / Self Care | Payer: Medicare HMO | Source: Ambulatory Visit | Attending: Radiation Oncology | Admitting: Radiation Oncology

## 2018-03-07 VITALS — BP 142/64 | HR 84 | Temp 97.8°F | Resp 18 | Ht 72.0 in | Wt 193.4 lb

## 2018-03-07 DIAGNOSIS — C3491 Malignant neoplasm of unspecified part of right bronchus or lung: Secondary | ICD-10-CM | POA: Diagnosis not present

## 2018-03-07 DIAGNOSIS — Z79890 Hormone replacement therapy (postmenopausal): Secondary | ICD-10-CM | POA: Diagnosis not present

## 2018-03-07 DIAGNOSIS — Z79899 Other long term (current) drug therapy: Secondary | ICD-10-CM | POA: Insufficient documentation

## 2018-03-07 DIAGNOSIS — Z794 Long term (current) use of insulin: Secondary | ICD-10-CM | POA: Insufficient documentation

## 2018-03-07 DIAGNOSIS — Z923 Personal history of irradiation: Secondary | ICD-10-CM | POA: Insufficient documentation

## 2018-03-07 NOTE — Progress Notes (Signed)
Pt here today for a follow-up for tracheal carcinoma. Pt denies having any pain. Pt states moderate fatigue. Pt denies having hemoptysis. Pt states a productive cough with yellow mucus. Pt denies having any shortness of breath. Pt denies having any painful or difficult swallowing. Pt states that he does not have any redness on his chest. Pt still has redness on his back and is using the sonafine lotion. Pt states that he uses it every 2-3 days. Pt states that he is not on chemotherapy treatments.  BP (!) 142/64 (BP Location: Left Arm, Patient Position: Sitting)   Pulse 84   Temp 97.8 F (36.6 C) (Oral)   Resp 18   Ht 6' (1.829 m)   Wt 193 lb 6.4 oz (87.7 kg)   SpO2 97%   BMI 26.23 kg/m    Wt Readings from Last 3 Encounters:  03/07/18 193 lb 6.4 oz (87.7 kg)  01/19/18 196 lb (88.9 kg)  12/19/17 197 lb (89.4 kg)

## 2018-03-07 NOTE — Progress Notes (Signed)
Radiation Oncology         [phone]) [phone] ________________________________  Name: Timothy Guerrero MRN: [id number]  Date: 03/07/2018  DOB: 1941/03/23  Follow-Up Visit Note  CC: Timothy Euler, MD  Timothy Guerrero, *    ICD-10-CM   1. 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 He reports constant moderate fatigue, productive cough with yellow mucus, and redness on his back. He denies any pain, hemoptysis, shortness of breath, painful or difficulty swallowing, and redness on his chest. He notes depression with having little energy and desire to do anything. He will follow up with Dr. Roxan Hockey on 03/12/18.  ALLERGIES:  has No Known Allergies.  Meds: Current Outpatient Medications  Medication Sig Dispense Refill  . albuterol (PROVENTIL HFA;VENTOLIN HFA) 108 (90 Base) MCG/ACT inhaler Inhale 1 puff into the lungs every 6 (six) hours as needed for wheezing or shortness of breath.    [name] amLODipine (NORVASC) 10 MG tablet TAKE 1 TABLET (10 MG TOTAL) BY MOUTH DAILY. ((BLOOD PRESSURE)) 90 tablet 1  . capsaicin (ZOSTRIX) 0.025 % cream Apply 1 application topically 2 (two) times daily as needed (Foot pain).    . furosemide (LASIX) 20 MG tablet Take 1 tablet (20 mg total) by mouth daily. 90 tablet 3  . glipiZIDE (GLUCOTROL) 10 MG tablet Take 1 tablet (10 mg total) by mouth daily before breakfast. ((for blood sugar)) 90 tablet 3  . Insulin Glargine (LANTUS SOLOSTAR) 100 UNIT/ML Solostar Pen Inject 20-40 Units into the skin daily at 10 pm. (Patient taking differently: Inject 30 Units into the skin every morning. ) 5 pen PRN  . irbesartan (AVAPRO) 300 MG tablet  Take 1 tablet (300 mg total) by mouth daily. 90 tablet 1  . levothyroxine (SYNTHROID, LEVOTHROID) 50 MCG tablet Take 1 tablet (50 mcg total) by mouth daily before breakfast. ((for thyroid)) 90 tablet 3  . LORazepam (ATIVAN) 1 MG tablet Take 1 tablet (1 mg total) by mouth every 8 (eight) hours. For anxiety, Take 1 hour prior up coming head scan. 15 tablet 0  . magnesium oxide (MAG-OX) 400 MG tablet Take 400 mg by mouth 2 (two) times daily.    . metFORMIN (GLUCOPHAGE) 1000 MG tablet TAKE 1 TABLET (1,000 MG TOTAL) BY MOUTH 2 (TWO) TIMES DAILY WITH A MEAL. 180 tablet 0  . metoprolol (TOPROL-XL) 200 MG 24 hr tablet Take 1 tablet (200 mg total) by mouth daily. 90 tablet 3  . pantoprazole (PROTONIX) 40 MG tablet TAKE 1 TABLET BY MOUTH EVERY DAY 90 tablet 1  . Semaglutide (OZEMPIC) 0.25 or 0.5 MG/DOSE SOPN Inject 0.5 mg into the skin once a week. 12 pen 1  . sucralfate (CARAFATE) 1 GM/10ML suspension Take 10 mLs (1 g total) by mouth 4 (four) times daily -  with meals and at bedtime. 420 mL 0  . Tiotropium Bromide-Olodaterol (STIOLTO RESPIMAT IN) Inhale 1 puff into the lungs daily as needed (shortness of breath).    . triamcinolone cream (KENALOG) 0.1 % Apply 1 application topically 2 (two) times daily. 30 g 0  . valACYclovir (VALTREX) 1000 MG tablet Take 1,000 mg by mouth daily as needed (cold sores).     [name]  Potassium Chloride ER 20 MEQ TBCR Take 20 mEq by mouth daily. Take only on days when taking lasix 90 tablet 0   No current facility-administered medications for this encounter.    Review of Systems: A 10+ POINT REVIEW OF SYSTEMS WAS OBTAINED including neurology, dermatology, psychiatry, cardiac, respiratory, lymph, extremities, GI, GU, musculoskeletal, constitutional, reproductive, HEENT. All pertinent positives are noted in the HPI. All others are negative.  Physical Findings: The patient is in no acute distress. Patient is alert and oriented.  height is 6' (1.829 m) and weight is 193 lb 6.4 oz (87.7  kg). His oral temperature is 97.8 F (36.6 C). His blood pressure is 142/64 (abnormal) and his pulse is 84. His respiration is 18 and oxygen saturation is 97%.   Lungs are clear to auscultation bilaterally. Heart has regular rate and rhythm. No palpable cervical, supraclavicular, or axillary adenopathy. Abdomen soft, non-tender, normal bowel sounds.  Lab Findings: Lab Results  Component Value Date   WBC 7.6 11/01/2017   HGB 14.6 11/01/2017   HCT 44.5 11/01/2017   MCV 91.9 11/01/2017   PLT 258 11/01/2017    Radiographic Findings: No results found.  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 Clinically stable. Patient continues to have a fair amount of fatigue. He has also been down over the past few weeks and is speaking with the [facility] about getting a psychiatry appointment.  He denies any suicidal tendencies.  Plan:  Patient will see Dr. Roxan Hockey on 03/12/18. Anticipate the patient will have repeat bronchoscopy to further evaluate his tracheal tumor at some time in the furture. Follow up with radiation oncology in 2 months.   -----------------------------------  Blair Promise, PhD, MD  This document serves as a record of services personally performed by Gery Pray, MD. It was created on his behalf by Wilburn Mylar, a trained medical scribe. The creation of this record is based on the scribe's personal observations and the provider's statements to them. This document has been checked and approved by the attending provider.

## 2018-03-12 ENCOUNTER — Ambulatory Visit: Payer: Medicare HMO | Admitting: Thoracic Surgery (Cardiothoracic Vascular Surgery)

## 2018-03-12 VITALS — BP 150/80 | HR 88 | Resp 20 | Ht 72.0 in | Wt 195.0 lb

## 2018-03-12 DIAGNOSIS — C33 Malignant neoplasm of trachea: Secondary | ICD-10-CM

## 2018-03-12 DIAGNOSIS — C3491 Malignant neoplasm of unspecified part of right bronchus or lung: Secondary | ICD-10-CM | POA: Diagnosis not present

## 2018-03-12 NOTE — Progress Notes (Signed)
[address]       [place] [address]             [phone]     HPI: Timothy Guerrero returns for a follow-up visit.  Timothy Guerrero is a 77 year old man who had a right lower lobectomy for stage Ia non-small cell carcinoma in 2016.  I saw him earlier this year with a year long history of hemoptysis.  He had had bronchoscopy which was nonrevealing.  I did a bronchoscopy on 11/01/2017 and found a tumor in the trachea.  It turned out to be a squamous cell carcinoma.  We discussed surgical resection versus primary radiation for treatment.  He opted for radiation.  He has completed his course of radiation.  He had some skin burns and radiation esophagitis.  He continues to have high volumes of mucus production but no hemoptysis.  He lost his appetite.  He says that is coming back slowly.  Past Medical History:  Diagnosis Date  . Anemia   . Arthritis   . Atrial flutter ([place])    post op following lobectomy  . Benign essential tremor   . Blood transfusion without reported diagnosis   . Cataract   . CHF (congestive heart failure) ([place])   . Colon polyp    2007, polyp with early cancer, one year f/u no residual polyp. next TCS 2010, see PSH. [facility].  [name] COPD (chronic obstructive pulmonary disease) ([place])   . Diabetes mellitus without complication ([place])    10 years  . GERD (gastroesophageal reflux disease)   . HOH (hard of hearing)   . Hypercholesteremia   . Hypertension    10 years  . Lung cancer ([place])   . Prostate cancer ([place])   . Shortness of breath dyspnea    due to lung mass  . Skin melanoma ([place])   . Sleep apnea    wears CPAP sometimes, cannot tolerate all the time.    Current Outpatient Medications  Medication Sig Dispense Refill  . albuterol (PROVENTIL HFA;VENTOLIN HFA) 108 (90 Base) MCG/ACT inhaler Inhale 1 puff into the lungs every 6 (six) hours as needed for wheezing or shortness of breath.    [name] amLODipine (NORVASC) 10 MG tablet TAKE 1 TABLET  (10 MG TOTAL) BY MOUTH DAILY. ((BLOOD PRESSURE)) 90 tablet 1  . capsaicin (ZOSTRIX) 0.025 % cream Apply 1 application topically 2 (two) times daily as needed (Foot pain).    . furosemide (LASIX) 20 MG tablet Take 1 tablet (20 mg total) by mouth daily. 90 tablet 3  . glipiZIDE (GLUCOTROL) 10 MG tablet Take 1 tablet (10 mg total) by mouth daily before breakfast. ((for blood sugar)) 90 tablet 3  . Insulin Glargine (LANTUS SOLOSTAR) 100 UNIT/ML Solostar Pen Inject 20-40 Units into the skin daily at 10 pm. (Patient taking differently: Inject 30 Units into the skin every morning. ) 5 pen PRN  . irbesartan (AVAPRO) 300 MG tablet Take 1 tablet (300 mg total) by mouth daily. 90 tablet 1  . levothyroxine (SYNTHROID, LEVOTHROID) 50 MCG tablet Take 1 tablet (50 mcg total) by mouth daily before breakfast. ((for thyroid)) 90 tablet 3  . LORazepam (ATIVAN) 1 MG tablet Take 1 tablet (1 mg total) by mouth every 8 (eight) hours. For anxiety, Take 1 hour prior up coming head scan. 15 tablet 0  . magnesium oxide (MAG-OX) 400 MG tablet Take 400 mg by mouth 2 (two) times daily.    [name]  metFORMIN (GLUCOPHAGE) 1000 MG tablet TAKE 1 TABLET (1,000 MG TOTAL) BY MOUTH 2 (TWO) TIMES DAILY WITH A MEAL. 180 tablet 0  . metoprolol (TOPROL-XL) 200 MG 24 hr tablet Take 1 tablet (200 mg total) by mouth daily. 90 tablet 3  . pantoprazole (PROTONIX) 40 MG tablet TAKE 1 TABLET BY MOUTH EVERY DAY 90 tablet 1  . Potassium Chloride ER 20 MEQ TBCR Take 20 mEq by mouth daily. Take only on days when taking lasix 90 tablet 0  . Semaglutide (OZEMPIC) 0.25 or 0.5 MG/DOSE SOPN Inject 0.5 mg into the skin once a week. 12 pen 1  . sucralfate (CARAFATE) 1 GM/10ML suspension Take 10 mLs (1 g total) by mouth 4 (four) times daily -  with meals and at bedtime. 420 mL 0  . Tiotropium Bromide-Olodaterol (STIOLTO RESPIMAT IN) Inhale 1 puff into the lungs daily as needed (shortness of breath).    . triamcinolone cream (KENALOG) 0.1 % Apply 1 application  topically 2 (two) times daily. 30 g 0  . valACYclovir (VALTREX) 1000 MG tablet Take 1,000 mg by mouth daily as needed (cold sores).      No current facility-administered medications for this visit.     Physical Exam BP (!) 150/80   Pulse 88   Resp 20   Ht 6' (1.829 m)   Wt 195 lb (88.5 kg)   SpO2 95% Comment: RA  BMI 26.48 kg/m  77 year old man in no acute distress Alert and oriented x3 with no focal deficits Lungs clear with equal breath sounds bilaterally  Impression: Timothy Guerrero is a 77 year old gentleman who had a right lower lobectomy for stage Ia non-small cell carcinoma in 2016.  He recently was found to have a primary tracheal tumor.  He opted for radiation treatment.  He has now completed therapy.  It is unclear exactly why he is here today.  I do not see any reason to do a bronchoscopy at this point so early after treatment.  I recommended he follow-up with Dr. Sondra Come in 2 months as scheduled.  After he has a CT scan then I can see him back and review the scan and see if there is any reason to do bronchoscopy or biopsies at that time.  Plan: Return in 2 to 3 months after CT scan and follow-up with Dr. Sondra Come.  Melrose Nakayama, MD [facility] [phone]

## 2018-03-28 ENCOUNTER — Other Ambulatory Visit: Payer: Self-pay | Admitting: *Deleted

## 2018-03-28 MED ORDER — POTASSIUM CHLORIDE ER 20 MEQ PO TBCR: 20.0000 meq | EXTENDED_RELEASE_TABLET | Freq: Every day | ORAL | 0 refills | Status: DC

## 2018-03-28 MED ORDER — METFORMIN HCL 1000 MG PO TABS: 1000.0000 mg | ORAL_TABLET | Freq: Two times a day (BID) | ORAL | 0 refills | Status: DC

## 2018-03-28 NOTE — Telephone Encounter (Signed)
Ov 04/03/18

## 2018-04-01 ENCOUNTER — Telehealth: Payer: Self-pay

## 2018-04-01 NOTE — Telephone Encounter (Signed)
Pt calling, requesting f/u appt with Dr. Sondra Come. Pt reports increased chest and back pain in the radiation field for past 4-5 days. Pt denies SOB. Pt was offered appt this afternoon but states that he can't make appt today and is physically able to wait on an appt on another day. Conveyed that staff in [facility] would return pt's call with appt date and time. Pt verbalized understanding and agreement. Loma Sousa, RN BSN

## 2018-04-02 ENCOUNTER — Telehealth: Payer: Self-pay

## 2018-04-02 NOTE — Telephone Encounter (Signed)
Contacted pt to remind him of appt with Dr. Sondra Come on 04/04/18 at 0930. Pt verbalized understanding and agreement. Loma Sousa, RN BSN

## 2018-04-03 ENCOUNTER — Ambulatory Visit (INDEPENDENT_AMBULATORY_CARE_PROVIDER_SITE_OTHER): Payer: Medicare HMO | Admitting: Family Medicine

## 2018-04-03 ENCOUNTER — Encounter: Payer: Self-pay | Admitting: Family Medicine

## 2018-04-03 VITALS — BP 136/82 | HR 78 | Temp 97.1°F | Ht 72.0 in | Wt 195.2 lb

## 2018-04-03 DIAGNOSIS — J449 Chronic obstructive pulmonary disease, unspecified: Secondary | ICD-10-CM

## 2018-04-03 DIAGNOSIS — E039 Hypothyroidism, unspecified: Secondary | ICD-10-CM

## 2018-04-03 DIAGNOSIS — E1159 Type 2 diabetes mellitus with other circulatory complications: Secondary | ICD-10-CM | POA: Diagnosis not present

## 2018-04-03 DIAGNOSIS — I10 Essential (primary) hypertension: Secondary | ICD-10-CM | POA: Diagnosis not present

## 2018-04-03 DIAGNOSIS — R69 Illness, unspecified: Secondary | ICD-10-CM | POA: Diagnosis not present

## 2018-04-03 DIAGNOSIS — F339 Major depressive disorder, recurrent, unspecified: Secondary | ICD-10-CM

## 2018-04-03 LAB — BAYER DCA HB A1C WAIVED: HB A1C (BAYER DCA - WAIVED): 8.3 % — ABNORMAL HIGH (ref <7.0)

## 2018-04-03 MED ORDER — INSULIN GLARGINE 100 UNIT/ML SOLOSTAR PEN: 20.0000 [IU] | PEN_INJECTOR | Freq: Every day | SUBCUTANEOUS | 99 refills | Status: DC

## 2018-04-03 MED ORDER — SERTRALINE HCL 50 MG PO TABS: 50.0000 mg | ORAL_TABLET | Freq: Every day | ORAL | 5 refills | Status: DC

## 2018-04-03 MED ORDER — AMLODIPINE-OLMESARTAN 10-40 MG PO TABS: 1.0000 | ORAL_TABLET | Freq: Every day | ORAL | 3 refills | Status: DC

## 2018-04-03 MED ORDER — METFORMIN HCL 1000 MG PO TABS: 1000.0000 mg | ORAL_TABLET | Freq: Two times a day (BID) | ORAL | 3 refills | Status: DC

## 2018-04-03 NOTE — Progress Notes (Signed)
BP 136/82   Pulse 78   Temp (!) 97.1 F (36.2 C) (Oral)   Ht 6' (1.829 m)   Wt 195 lb 3.2 oz (88.5 kg)   BMI 26.47 kg/m    Subjective:    Patient ID: Timothy Guerrero, male    DOB: 03-05-1941, 77 y.o.   MRN: [id number]  HPI: Timothy Guerrero is a 77 y.o. male presenting on 04/03/2018 for Diabetes (3 month follow up- Has had 32 radiation since and states that he has having body aches.); Hypertension; and Establish Care Timothy Guerrero pt- patient states he would like to go over medication list and try to get off some of his medications.)   HPI Type 2 diabetes mellitus Patient comes in today for recheck of his diabetes. Patient has been currently taking Ozempic and glipizide and metformin. Patient is currently on an ACE inhibitor/ARB. Patient has not seen an ophthalmologist this year. Patient denies any issues with their feet.   Hypertension Patient is currently on metoprolol and amlodipine and irbesartan patient wants to try and combine and reduce his pill load so we will try and switch him to a combination of amlodipine-olmesartan, and their blood pressure today is 136/82. Patient denies any lightheadedness or dizziness. Patient denies headaches, blurred vision, chest pains, shortness of breath, or weakness. Denies any side effects from medication and is content with current medication.   Hypothyroidism recheck Patient is coming in for thyroid recheck today as well. They deny any issues with hair changes or heat or cold problems or diarrhea or constipation. They deny any chest pain or palpitations. They are currently on levothyroxine 11mcrograms   COPD Patient is coming in for COPD recheck today.  He is currently on Stiolto and albuterol.  He has a mild chronic cough but denies any major coughing spells or wheezing spells.  He has 0nighttime symptoms per week and 0daytime symptoms per week currently.   Depression Patient is also coming in for recurrent depression recheck.  He is  currently on lorazepam and is not on anything else for maintenance.  He says that he has some thoughts of being better off dead and feels down and depressed and would like to discuss other options.  He denies any suicidal ideations or thoughts of hurting himself. [facility] 04/03/2018 01/19/2018 12/06/2017 11/28/2017 10/16/2017  Decreased Interest 0 0 0 0 0  Down, Depressed, Hopeless 3 0 1 0 0  PHQ - 2 Score 3 0 1 0 0  Altered sleeping 3 - - - -  Tired, decreased energy 3 - - - -  Change in appetite 3 - - - -  Feeling bad or failure about yourself  0 - - - -  Trouble concentrating 3 - - - -  Moving slowly or fidgety/restless 3 - - - -  Suicidal thoughts 1 - - - -  PHQ-9 Score 19 - - - -  Some recent data might be hidden     Relevant past medical, surgical, family and social history reviewed and updated as indicated. Interim medical history since our last visit reviewed. Allergies and medications reviewed and updated.  Review of Systems  Constitutional: Negative for chills and fever.  Respiratory: Negative for shortness of breath and wheezing.   Cardiovascular: Negative for chest pain and leg swelling.  Musculoskeletal: Negative for back pain and gait problem.  Skin: Negative for rash.  Neurological: Negative for dizziness, weakness and numbness.  Psychiatric/Behavioral: Positive for dysphoric mood. Negative for  self-injury, sleep disturbance and suicidal ideas. The patient is nervous/anxious.   All other systems reviewed and are negative.   Per HPI unless specifically indicated above   Allergies as of 04/03/2018   No Known Allergies     Medication List        Accurate as of 04/03/18  2:53 PM. Always use your most recent med list.          albuterol 108 (90 Base) MCG/ACT inhaler Commonly known as:  PROVENTIL HFA;VENTOLIN HFA Inhale 1 puff into the lungs every 6 (six) hours as needed for wheezing or shortness of breath.   amLODipine 10 MG tablet Commonly known  as:  NORVASC TAKE 1 TABLET (10 MG TOTAL) BY MOUTH DAILY. ((BLOOD PRESSURE))   amLODipine-olmesartan 10-40 MG tablet Commonly known as:  AZOR Take 1 tablet by mouth daily.   capsaicin 0.025 % cream Commonly known as:  ZOSTRIX Apply 1 application topically 2 (two) times daily as needed (Foot pain).   furosemide 20 MG tablet Commonly known as:  LASIX Take 1 tablet (20 mg total) by mouth daily.   glipiZIDE 10 MG tablet Commonly known as:  GLUCOTROL Take 1 tablet (10 mg total) by mouth daily before breakfast. ((for blood sugar))   Insulin Glargine 100 UNIT/ML Solostar Pen Commonly known as:  LANTUS Inject 20-40 Units into the skin daily at 10 pm.   levothyroxine 50 MCG tablet Commonly known as:  SYNTHROID, LEVOTHROID Take 1 tablet (50 mcg total) by mouth daily before breakfast. ((for thyroid))   LORazepam 1 MG tablet Commonly known as:  ATIVAN Take 1 tablet (1 mg total) by mouth every 8 (eight) hours. For anxiety, Take 1 hour prior up coming head scan.   magnesium oxide 400 MG tablet Commonly known as:  MAG-OX Take 400 mg by mouth 2 (two) times daily.   metFORMIN 1000 MG tablet Commonly known as:  GLUCOPHAGE Take 1 tablet (1,000 mg total) by mouth 2 (two) times daily with a meal.   metoprolol 200 MG 24 hr tablet Commonly known as:  TOPROL-XL Take 1 tablet (200 mg total) by mouth daily.   pantoprazole 40 MG tablet Commonly known as:  PROTONIX TAKE 1 TABLET BY MOUTH EVERY DAY   Potassium Chloride ER 20 MEQ Tbcr Take 20 mEq by mouth daily. Take only on days when taking lasix   Semaglutide(0.25 or 0.5MG/DOS) 2 MG/1.5ML Sopn Inject 0.5 mg into the skin once a week.   sertraline 50 MG tablet Commonly known as:  ZOLOFT Take 1 tablet (50 mg total) by mouth daily.   STIOLTO RESPIMAT IN Inhale 1 puff into the lungs daily as needed (shortness of breath).   sucralfate 1 GM/10ML suspension Commonly known as:  CARAFATE Take 10 mLs (1 g total) by mouth 4 (four) times daily -   with meals and at bedtime.   triamcinolone cream 0.1 % Commonly known as:  KENALOG Apply 1 application topically 2 (two) times daily.   VALTREX 1000 MG tablet Generic drug:  valACYclovir Take 1,000 mg by mouth daily as needed (cold sores).          Objective:    BP 136/82   Pulse 78   Temp (!) 97.1 F (36.2 C) (Oral)   Ht 6' (1.829 m)   Wt 195 lb 3.2 oz (88.5 kg)   BMI 26.47 kg/m   Wt Readings from Last 3 Encounters:  04/03/18 195 lb 3.2 oz (88.5 kg)  03/12/18 195 lb (88.5 kg)  03/07/18 193 lb  6.4 oz (87.7 kg)    Physical Exam  Constitutional: He is oriented to person, place, and time. He appears well-developed and well-nourished. No distress.  Eyes: Conjunctivae are normal. No scleral icterus.  Neck: Neck supple. No thyromegaly present.  Cardiovascular: Normal rate, regular rhythm, normal heart sounds and intact distal pulses.  No murmur heard. Pulmonary/Chest: Effort normal and breath sounds normal. No respiratory distress. He has no wheezes.  Musculoskeletal: Normal range of motion. He exhibits no edema.  Lymphadenopathy:    He has no cervical adenopathy.  Neurological: He is alert and oriented to person, place, and time. Coordination normal.  Skin: Skin is warm and dry. No rash noted. He is not diaphoretic.  Psychiatric: He has a normal mood and affect. His behavior is normal.  Nursing note and vitals reviewed.       Assessment & Plan:   Problem List Items Addressed This Visit      Cardiovascular and Mediastinum   Essential hypertension   Relevant Medications   amLODipine-olmesartan (AZOR) 10-40 MG tablet   Other Relevant Orders   CBC with Differential/Platelet (Completed)   CMP14+EGFR (Completed)     Respiratory   COPD, moderate (HCC)     Endocrine   Diabetes mellitus ([place])   Relevant Medications   Insulin Glargine (LANTUS SOLOSTAR) 100 UNIT/ML Solostar Pen   amLODipine-olmesartan (AZOR) 10-40 MG tablet   metFORMIN (GLUCOPHAGE) 1000 MG tablet     Other Relevant Orders   CBC with Differential/Platelet (Completed)   CMP14+EGFR (Completed)   Lipid panel (Completed)   Bayer DCA Hb A1c Waived (Completed)   Hypothyroidism - Primary   Relevant Orders   TSH (Completed)     Other   Depression, recurrent (HCC)   Relevant Medications   sertraline (ZOLOFT) 50 MG tablet     Will start Zoloft and continue lorazepam for now, instructed patient to use lorazepam only as needed.  For diabetes we will continue insulin and metformin and Ozempic and glipizide, discussed stopping glipizide with patient because we do not think it is doing him any good anymore and he will trial off of that and if it goes well he will keep off of it.  Follow up plan: Return in about 4 weeks (around 05/01/2018), or if symptoms worsen or fail to improve, for Recheck hypertension and depression.  Counseling provided for all of the vaccine components Orders Placed This Encounter  Procedures  . CBC with Differential/Platelet  . CMP14+EGFR  . Lipid panel  . TSH  . Bayer [facility] Hb A1c Waived    Caryl Pina, MD [facility] 04/03/2018, 2:53 PM

## 2018-04-04 ENCOUNTER — Other Ambulatory Visit: Payer: Self-pay

## 2018-04-04 ENCOUNTER — Ambulatory Visit
Admission: RE | Admit: 2018-04-04 | Discharge: 2018-04-04 | Disposition: A | Discharge status: Home / Self Care | Payer: Medicare HMO | Source: Ambulatory Visit | Attending: Radiation Oncology | Admitting: Radiation Oncology

## 2018-04-04 ENCOUNTER — Encounter: Payer: Self-pay | Admitting: Radiation Oncology

## 2018-04-04 VITALS — BP 151/87 | HR 99 | Temp 97.9°F | Resp 20 | Ht 72.0 in | Wt 195.2 lb

## 2018-04-04 DIAGNOSIS — C3491 Malignant neoplasm of unspecified part of right bronchus or lung: Secondary | ICD-10-CM | POA: Diagnosis not present

## 2018-04-04 DIAGNOSIS — Z7984 Long term (current) use of oral hypoglycemic drugs: Secondary | ICD-10-CM | POA: Diagnosis not present

## 2018-04-04 DIAGNOSIS — Z923 Personal history of irradiation: Secondary | ICD-10-CM | POA: Insufficient documentation

## 2018-04-04 DIAGNOSIS — Z79899 Other long term (current) drug therapy: Secondary | ICD-10-CM | POA: Diagnosis not present

## 2018-04-04 LAB — TSH: TSH: 2.15 u[IU]/mL (ref 0.450–4.500)

## 2018-04-04 LAB — CBC WITH DIFFERENTIAL/PLATELET
Basophils Absolute: 0 10*3/uL (ref 0.0–0.2)
Basos: 0 %
EOS (ABSOLUTE): 0.2 10*3/uL (ref 0.0–0.4)
Eos: 3 %
Hematocrit: 44.3 % (ref 37.5–51.0)
Hemoglobin: 14.8 g/dL (ref 13.0–17.7)
Immature Grans (Abs): 0 10*3/uL (ref 0.0–0.1)
Immature Granulocytes: 0 %
Lymphocytes Absolute: 0.8 10*3/uL (ref 0.7–3.1)
Lymphs: 12 %
MCH: 30.5 pg (ref 26.6–33.0)
MCHC: 33.4 g/dL (ref 31.5–35.7)
MCV: 91 fL (ref 79–97)
Monocytes Absolute: 0.7 10*3/uL (ref 0.1–0.9)
Monocytes: 10 %
Neutrophils Absolute: 5.1 10*3/uL (ref 1.4–7.0)
Neutrophils: 75 %
Platelets: 254 10*3/uL (ref 150–450)
RBC: 4.85 x10E6/uL (ref 4.14–5.80)
RDW: 13.8 % (ref 12.3–15.4)
WBC: 6.9 10*3/uL (ref 3.4–10.8)

## 2018-04-04 LAB — CMP14+EGFR
ALT: 11 IU/L (ref 0–44)
AST: 8 IU/L (ref 0–40)
Albumin/Globulin Ratio: 1.6 (ref 1.2–2.2)
Albumin: 4.1 g/dL (ref 3.5–4.8)
Alkaline Phosphatase: 80 IU/L (ref 39–117)
BUN/Creatinine Ratio: 17 (ref 10–24)
BUN: 14 mg/dL (ref 8–27)
Bilirubin Total: 0.4 mg/dL (ref 0.0–1.2)
CO2: 28 mmol/L (ref 20–29)
Calcium: 9.8 mg/dL (ref 8.6–10.2)
Chloride: 101 mmol/L (ref 96–106)
Creatinine, Ser: 0.83 mg/dL (ref 0.76–1.27)
GFR calc Af Amer: 99 mL/min/{1.73_m2} (ref >59)
GFR calc non Af Amer: 85 mL/min/{1.73_m2} (ref >59)
Globulin, Total: 2.5 g/dL (ref 1.5–4.5)
Glucose: 219 mg/dL — ABNORMAL HIGH (ref 65–99)
Potassium: 4.4 mmol/L (ref 3.5–5.2)
Sodium: 141 mmol/L (ref 134–144)
Total Protein: 6.6 g/dL (ref 6.0–8.5)

## 2018-04-04 LAB — LIPID PANEL
Chol/HDL Ratio: 4.2 ratio (ref 0.0–5.0)
Cholesterol, Total: 169 mg/dL (ref 100–199)
HDL: 40 mg/dL (ref >39)
LDL Calculated: 102 mg/dL — ABNORMAL HIGH (ref 0–99)
Triglycerides: 134 mg/dL (ref 0–149)
VLDL Cholesterol Cal: 27 mg/dL (ref 5–40)

## 2018-04-04 NOTE — Progress Notes (Signed)
Radiation Oncology         [phone]) [phone] ________________________________  Name: Timothy Guerrero MRN: [id number]  Date: 04/04/2018  DOB: 1940/10/10  Follow-Up Visit Note  CC: Dettinger, Fransisca Kaufmann, MD  Melrose Nakayama, *    ICD-10-CM   1. 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 He requested to be seen sooner than his scheduled appointment due to increasing pain to his chest and back at the radiation field. He reports the pain goes from his mid chest to his back and extends to bilateral axillary regions including under his arms. He notes it hurts to touch the skin and when he coughs. He also reports fatigue and decreased appetite and denies shortness of breath and itching to the radiation site. He notes coughing up mucus since radiation treatment.  Since his last appointment, he saw Dr. Roxan Hockey on 03/12/18. He Did not recommend bronchoscopy at this point, wanted to see a chest CT scan prior to considering this procedure.  He also his PCP yesterday, 04/03/18; prescribed Zoloft but has not started this medication. This was prescribed by his psychiatrist at the [facility] hospital which he is scheduled to see next week.  ALLERGIES:  has No Known Allergies.  Meds: Current Outpatient Medications  Medication Sig Dispense Refill  . albuterol (PROVENTIL HFA;VENTOLIN HFA) 108 (90 Base) MCG/ACT inhaler Inhale 1 puff into the lungs every 6 (six) hours as needed for wheezing or shortness of breath.    [name] amLODipine (NORVASC) 10 MG tablet TAKE 1 TABLET (10 MG TOTAL) BY MOUTH DAILY. ((BLOOD PRESSURE)) 90 tablet 1  . amLODipine-olmesartan (AZOR)  10-40 MG tablet Take 1 tablet by mouth daily. 90 tablet 3  . capsaicin (ZOSTRIX) 0.025 % cream Apply 1 application topically 2 (two) times daily as needed (Foot pain).    . furosemide (LASIX) 20 MG tablet Take 1 tablet (20 mg total) by mouth daily. 90 tablet 3  . glipiZIDE (GLUCOTROL) 10 MG tablet Take 1 tablet (10 mg total) by mouth daily before breakfast. ((for blood sugar)) 90 tablet 3  . Insulin Glargine (LANTUS SOLOSTAR) 100 UNIT/ML Solostar Pen Inject 20-40 Units into the skin daily at 10 pm. 5 pen PRN  . levothyroxine (SYNTHROID, LEVOTHROID) 50 MCG tablet Take 1 tablet (50 mcg total) by mouth daily before breakfast. ((for thyroid)) 90 tablet 3  . LORazepam (ATIVAN) 1 MG tablet Take 1 tablet (1 mg total) by mouth every 8 (eight) hours. For anxiety, Take 1 hour prior up coming head scan. 15 tablet 0  . magnesium oxide (MAG-OX) 400 MG tablet Take 400 mg by mouth 2 (two) times daily.    . metFORMIN (GLUCOPHAGE) 1000 MG tablet Take 1 tablet (1,000 mg total) by mouth 2 (two) times daily with a meal. 180 tablet 3  . metoprolol (TOPROL-XL) 200 MG 24 hr tablet Take 1 tablet (200 mg total) by mouth daily. 90 tablet 3  . pantoprazole (PROTONIX) 40 MG tablet TAKE 1 TABLET BY MOUTH EVERY DAY 90 tablet 1  . Potassium Chloride ER 20 MEQ TBCR Take 20 mEq by mouth daily. Take only on days when taking lasix 90 tablet  0  . Semaglutide (OZEMPIC) 0.25 or 0.5 MG/DOSE SOPN Inject 0.5 mg into the skin once a week. 12 pen 1  . sertraline (ZOLOFT) 50 MG tablet Take 1 tablet (50 mg total) by mouth daily. 30 tablet 5  . sucralfate (CARAFATE) 1 GM/10ML suspension Take 10 mLs (1 g total) by mouth 4 (four) times daily -  with meals and at bedtime. 420 mL 0  . Tiotropium Bromide-Olodaterol (STIOLTO RESPIMAT IN) Inhale 1 puff into the lungs daily as needed (shortness of breath).    . triamcinolone cream (KENALOG) 0.1 % Apply 1 application topically 2 (two) times daily. 30 g 0  . valACYclovir (VALTREX) 1000 MG tablet Take  1,000 mg by mouth daily as needed (cold sores).      No current facility-administered medications for this encounter.    Review of Systems: A 10+ POINT REVIEW OF SYSTEMS WAS OBTAINED including neurology, dermatology, psychiatry, cardiac, respiratory, lymph, extremities, GI, GU, musculoskeletal, constitutional, reproductive, HEENT. All pertinent positives are noted in the HPI. All others are negative.  Physical Findings: The patient is in no acute distress. Patient is alert and oriented.  height is 6' (1.829 m) and weight is 195 lb 3.2 oz (88.5 kg). His oral temperature is 97.9 F (36.6 C). His blood pressure is 151/87 (abnormal) and his pulse is 99. His respiration is 20 and oxygen saturation is 97%.   No skin issues noted along the chest or back region. Mild tenderness with palpation throughout the chest and axillary area. Lungs are clear to auscultation bilaterally. Heart has regular rate and rhythm. No palpable cervical, supraclavicular, or axillary adenopathy. Abdomen soft, non-tender, normal bowel sounds.   Lab Findings: Lab Results  Component Value Date   WBC 6.9 04/03/2018   HGB 14.8 04/03/2018   HCT 44.3 04/03/2018   MCV 91 04/03/2018   PLT 254 04/03/2018    Radiographic Findings: No results found.  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 We will therefore move his chest CT scan up to next week.  Plan:  We will schedule a CT scan at [facility] for next week. He will follow up with radiation oncology in 3 months.   -----------------------------------  Blair Promise, PhD, MD  This document serves as a record of services personally performed by Gery Pray, MD. It was created on his behalf by Wilburn Mylar, a trained medical scribe. The creation of this record is based on the scribe's personal observations and the  provider's statements to them. This document has been checked and approved by the attending provider.

## 2018-04-04 NOTE — Progress Notes (Signed)
Timothy Guerrero presents for follow up of radiation completed 01/31/18 to his chest. He reports pain to his mid chest area that feels like it goes from the front to the back. He tells me that it extends to his bilateral axillary regions. The pain began this Saturday and was not present before then. He has not taken anything over the counter for the pain. He reports that the skin to his radiation site has healed and does not itch at this time. He has fatigue. He has a decreased appetite. He denies shortness of breath or cough.   BP (!) 151/87 (BP Location: Right Arm, Patient Position: Sitting)   Pulse 99   Temp 97.9 F (36.6 C) (Oral)   Resp 20   Ht 6' (1.829 m)   Wt 195 lb 3.2 oz (88.5 kg)   SpO2 97%   BMI 26.47 kg/m    Wt Readings from Last 3 Encounters:  04/04/18 195 lb 3.2 oz (88.5 kg)  04/03/18 195 lb 3.2 oz (88.5 kg)  03/12/18 195 lb (88.5 kg)

## 2018-04-11 ENCOUNTER — Telehealth: Payer: Self-pay | Admitting: Family Medicine

## 2018-04-11 NOTE — Telephone Encounter (Signed)
Apt made

## 2018-04-17 ENCOUNTER — Telehealth: Payer: Self-pay | Admitting: *Deleted

## 2018-04-17 ENCOUNTER — Ambulatory Visit: Payer: Medicare HMO | Admitting: Family Medicine

## 2018-04-17 NOTE — Telephone Encounter (Signed)
Returned patient's phone call, spoke with patient 

## 2018-04-18 ENCOUNTER — Telehealth: Payer: Self-pay | Admitting: *Deleted

## 2018-04-18 ENCOUNTER — Other Ambulatory Visit: Payer: Self-pay

## 2018-04-18 DIAGNOSIS — C3491 Malignant neoplasm of unspecified part of right bronchus or lung: Secondary | ICD-10-CM

## 2018-04-18 NOTE — Telephone Encounter (Signed)
Called patient to inform of Stat labs on 05-09-18 @ 9:15 am @ [facility], then CT to be @ 10 am @ [facility], pt. to have water only starting @ 6 am on 05-09-18, test to be @ [place], spoke with patient and he is aware of these appts.

## 2018-04-19 ENCOUNTER — Ambulatory Visit (INDEPENDENT_AMBULATORY_CARE_PROVIDER_SITE_OTHER): Payer: Medicare HMO | Admitting: Family Medicine

## 2018-04-19 ENCOUNTER — Encounter: Payer: Self-pay | Admitting: Family Medicine

## 2018-04-19 ENCOUNTER — Ambulatory Visit (HOSPITAL_COMMUNITY): Payer: Medicare HMO

## 2018-04-19 VITALS — BP 131/74 | HR 101 | Temp 97.3°F | Ht 72.0 in | Wt 197.0 lb

## 2018-04-19 DIAGNOSIS — E039 Hypothyroidism, unspecified: Secondary | ICD-10-CM | POA: Diagnosis not present

## 2018-04-19 DIAGNOSIS — I10 Essential (primary) hypertension: Secondary | ICD-10-CM

## 2018-04-19 DIAGNOSIS — Z23 Encounter for immunization: Secondary | ICD-10-CM | POA: Diagnosis not present

## 2018-04-19 DIAGNOSIS — E1159 Type 2 diabetes mellitus with other circulatory complications: Secondary | ICD-10-CM

## 2018-04-19 NOTE — Progress Notes (Signed)
BP 131/74   Pulse (!) 101   Temp (!) 97.3 F (36.3 C) (Oral)   Ht 6' (1.829 m)   Wt 197 lb (89.4 kg)   BMI 26.72 kg/m    Subjective:    Patient ID: Timothy Guerrero, male    DOB: 1941-03-09, 77 y.o.   MRN: [id number]  HPI: Timothy Guerrero is a 77 y.o. male presenting on 04/19/2018 for Hyperglycemia   HPI Type 2 diabetes mellitus Patient comes in today for recheck of his diabetes. Patient has been currently taking Lantus 20 units nightly and metformin and Ozempic. Patient is currently on an ACE inhibitor/ARB. Patient has not seen an ophthalmologist this year. Patient denies any issues with their feet.   Hypertension Patient is currently on amlodipine-olmesartan and metoprolol, and their blood pressure today is 131/74. Patient denies any lightheadedness or dizziness. Patient denies headaches, blurred vision, chest pains, shortness of breath, or weakness. Denies any side effects from medication and is content with current medication.   Hypothyroidism recheck Patient is coming in for thyroid recheck today as well. They deny any issues with hair changes or heat or cold problems or diarrhea or constipation. They deny any chest pain or palpitations. They are currently on levothyroxine 15mcrograms   Relevant past medical, surgical, family and social history reviewed and updated as indicated. Interim medical history since our last visit reviewed. Allergies and medications reviewed and updated.  Review of Systems  Constitutional: Negative for chills and fever.  Eyes: Negative for visual disturbance.  Respiratory: Negative for shortness of breath and wheezing.   Cardiovascular: Negative for chest pain and leg swelling.  Musculoskeletal: Negative for back pain and gait problem.  Skin: Negative for rash.  Neurological: Negative for dizziness, weakness, light-headedness and headaches.  All other systems reviewed and are negative.   Per HPI unless specifically indicated  above   Allergies as of 04/19/2018   No Known Allergies     Medication List        Accurate as of 04/19/18  9:21 AM. Always use your most recent med list.          albuterol 108 (90 Base) MCG/ACT inhaler Commonly known as:  PROVENTIL HFA;VENTOLIN HFA Inhale 1 puff into the lungs every 6 (six) hours as needed for wheezing or shortness of breath.   amLODipine-olmesartan 10-40 MG tablet Commonly known as:  AZOR Take 1 tablet by mouth daily.   capsaicin 0.025 % cream Commonly known as:  ZOSTRIX Apply 1 application topically 2 (two) times daily as needed (Foot pain).   furosemide 20 MG tablet Commonly known as:  LASIX Take 1 tablet (20 mg total) by mouth daily.   Insulin Glargine 100 UNIT/ML Solostar Pen Commonly known as:  LANTUS Inject 20-40 Units into the skin daily at 10 pm.   levothyroxine 50 MCG tablet Commonly known as:  SYNTHROID, LEVOTHROID Take 1 tablet (50 mcg total) by mouth daily before breakfast. ((for thyroid))   LORazepam 1 MG tablet Commonly known as:  ATIVAN Take 1 tablet (1 mg total) by mouth every 8 (eight) hours. For anxiety, Take 1 hour prior up coming head scan.   magnesium oxide 400 MG tablet Commonly known as:  MAG-OX Take 400 mg by mouth 2 (two) times daily.   metFORMIN 1000 MG tablet Commonly known as:  GLUCOPHAGE Take 1 tablet (1,000 mg total) by mouth 2 (two) times daily with a meal.   metoprolol 200 MG 24 hr tablet Commonly known as:  TOPROL-XL Take 1 tablet (200 mg total) by mouth daily.   pantoprazole 40 MG tablet Commonly known as:  PROTONIX TAKE 1 TABLET BY MOUTH EVERY DAY   Potassium Chloride ER 20 MEQ Tbcr Take 20 mEq by mouth daily. Take only on days when taking lasix   Semaglutide(0.25 or 0.5MG/DOS) 2 MG/1.5ML Sopn Inject 0.5 mg into the skin once a week.   sertraline 50 MG tablet Commonly known as:  ZOLOFT Take 1 tablet (50 mg total) by mouth daily.   STIOLTO RESPIMAT IN Inhale 1 puff into the lungs daily as  needed (shortness of breath).   sucralfate 1 GM/10ML suspension Commonly known as:  CARAFATE Take 10 mLs (1 g total) by mouth 4 (four) times daily -  with meals and at bedtime.   triamcinolone cream 0.1 % Commonly known as:  KENALOG Apply 1 application topically 2 (two) times daily.   VALTREX 1000 MG tablet Generic drug:  valACYclovir Take 1,000 mg by mouth daily as needed (cold sores).          Objective:    BP 131/74   Pulse (!) 101   Temp (!) 97.3 F (36.3 C) (Oral)   Ht 6' (1.829 m)   Wt 197 lb (89.4 kg)   BMI 26.72 kg/m   Wt Readings from Last 3 Encounters:  04/19/18 197 lb (89.4 kg)  04/04/18 195 lb 3.2 oz (88.5 kg)  04/03/18 195 lb 3.2 oz (88.5 kg)    Physical Exam  Constitutional: He is oriented to person, place, and time. He appears well-developed and well-nourished. No distress.  Eyes: Conjunctivae are normal. No scleral icterus.  Neck: Neck supple. No thyromegaly present.  Cardiovascular: Normal rate, regular rhythm, normal heart sounds and intact distal pulses.  No murmur heard. Pulmonary/Chest: Effort normal and breath sounds normal. No respiratory distress. He has no wheezes.  Musculoskeletal: Normal range of motion. He exhibits no edema.  Lymphadenopathy:    He has no cervical adenopathy.  Neurological: He is alert and oriented to person, place, and time. Coordination normal.  Skin: Skin is warm and dry. No rash noted. He is not diaphoretic.  Psychiatric: He has a normal mood and affect. His behavior is normal.  Nursing note and vitals reviewed.   Results for orders placed or performed in visit on 04/03/18  CBC with Differential/Platelet  Result Value Ref Range   WBC 6.9 3.4 - 10.8 x10E3/uL   RBC 4.85 4.14 - 5.80 x10E6/uL   Hemoglobin 14.8 13.0 - 17.7 g/dL   Hematocrit 44.3 37.5 - 51.0 %   MCV 91 79 - 97 fL   MCH 30.5 26.6 - 33.0 pg   MCHC 33.4 31.5 - 35.7 g/dL   RDW 13.8 12.3 - 15.4 %   Platelets 254 150 - 450 x10E3/uL   Neutrophils 75 Not  Estab. %   Lymphs 12 Not Estab. %   Monocytes 10 Not Estab. %   Eos 3 Not Estab. %   Basos 0 Not Estab. %   Neutrophils Absolute 5.1 1.4 - 7.0 x10E3/uL   Lymphocytes Absolute 0.8 0.7 - 3.1 x10E3/uL   Monocytes Absolute 0.7 0.1 - 0.9 x10E3/uL   EOS (ABSOLUTE) 0.2 0.0 - 0.4 x10E3/uL   Basophils Absolute 0.0 0.0 - 0.2 x10E3/uL   Immature Granulocytes 0 Not Estab. %   Immature Grans (Abs) 0.0 0.0 - 0.1 x10E3/uL  CMP14+EGFR  Result Value Ref Range   Glucose 219 (H) 65 - 99 mg/dL   BUN 14 8 - 27  mg/dL   Creatinine, Ser 0.83 0.76 - 1.27 mg/dL   GFR calc non Af Amer 85 >59 mL/min/1.73   GFR calc Af Amer 99 >59 mL/min/1.73   BUN/Creatinine Ratio 17 10 - 24   Sodium 141 134 - 144 mmol/L   Potassium 4.4 3.5 - 5.2 mmol/L   Chloride 101 96 - 106 mmol/L   CO2 28 20 - 29 mmol/L   Calcium 9.8 8.6 - 10.2 mg/dL   Total Protein 6.6 6.0 - 8.5 g/dL   Albumin 4.1 3.5 - 4.8 g/dL   Globulin, Total 2.5 1.5 - 4.5 g/dL   Albumin/Globulin Ratio 1.6 1.2 - 2.2   Bilirubin Total 0.4 0.0 - 1.2 mg/dL   Alkaline Phosphatase 80 39 - 117 IU/L   AST 8 0 - 40 IU/L   ALT 11 0 - 44 IU/L  Lipid panel  Result Value Ref Range   Cholesterol, Total 169 100 - 199 mg/dL   Triglycerides 134 0 - 149 mg/dL   HDL 40 >39 mg/dL   VLDL Cholesterol Cal 27 5 - 40 mg/dL   LDL Calculated 102 (H) 0 - 99 mg/dL   Chol/HDL Ratio 4.2 0.0 - 5.0 ratio  TSH  Result Value Ref Range   TSH 2.150 0.450 - 4.500 uIU/mL  Bayer DCA Hb A1c Waived  Result Value Ref Range   HB A1C (BAYER DCA - WAIVED) 8.3 (H) <7.0 %      Assessment & Plan:   Problem List Items Addressed This Visit      Cardiovascular and Mediastinum   Essential hypertension     Endocrine   Diabetes mellitus ([place]) - Primary   Hypothyroidism    Other Visit Diagnoses    Encounter for immunization       Relevant Orders   Flu vaccine HIGH DOSE PF (Completed)      Continue current medication Follow up plan: Return in about 3 months (around 07/20/2018), or if  symptoms worsen or fail to improve, for Type 2 diabetes recheck.  Counseling provided for all of the vaccine components Orders Placed This Encounter  Procedures  . Flu vaccine HIGH DOSE PF    Caryl Pina, MD [facility] 04/19/2018, 9:21 AM

## 2018-04-19 NOTE — Patient Instructions (Signed)
Increase lantus to 35 units daily

## 2018-04-25 ENCOUNTER — Telehealth: Payer: Self-pay | Admitting: Family Medicine

## 2018-04-25 NOTE — Telephone Encounter (Signed)
lmtcb

## 2018-04-26 NOTE — Telephone Encounter (Signed)
Second call patient is calling about a Diabetes med that he does not know the name of, but states it is two combined and is a pill. It was going to cost him over $100. Patient states he did not pick it up. He is aware Dettinger is out of the office, but wants to wait till Monday for him to come back in to be addressed. Please advise.

## 2018-04-29 MED ORDER — AMLODIPINE BESYLATE 10 MG PO TABS: 10.0000 mg | ORAL_TABLET | Freq: Every day | ORAL | 3 refills | Status: DC

## 2018-04-29 MED ORDER — OLMESARTAN MEDOXOMIL 40 MG PO TABS: 40.0000 mg | ORAL_TABLET | Freq: Every day | ORAL | 3 refills | Status: DC

## 2018-04-29 NOTE — Telephone Encounter (Signed)
Pt wants them split, please check order and send if correct

## 2018-04-29 NOTE — Addendum Note (Signed)
Addended by: Caryl Pina on: 04/29/2018 10:05 AM   Modules accepted: Orders

## 2018-04-29 NOTE — Telephone Encounter (Signed)
The only combination pill that I have prescribed for him is the blood pressure one with amlodipine and it, if that is the one that is causing him over $100 then we can just separate the 2 and it will be much cheaper.  I do not believe I have him on any diabetes medications where there are 2 combined.

## 2018-04-29 NOTE — Addendum Note (Signed)
Addended byCarrolyn Leigh on: 04/29/2018 09:19 AM   Modules accepted: Orders

## 2018-04-29 NOTE — Telephone Encounter (Signed)
LMTCB

## 2018-05-02 ENCOUNTER — Ambulatory Visit: Payer: Medicare HMO | Admitting: Family Medicine

## 2018-05-09 ENCOUNTER — Ambulatory Visit: Payer: Medicare HMO | Admitting: Radiation Oncology

## 2018-05-09 ENCOUNTER — Ambulatory Visit (HOSPITAL_COMMUNITY)
Admission: RE | Admit: 2018-05-09 | Discharge: 2018-05-09 | Disposition: A | Discharge status: Home / Self Care | Payer: Medicare HMO | Source: Ambulatory Visit | Attending: Radiation Oncology | Admitting: Radiation Oncology

## 2018-05-09 DIAGNOSIS — C3491 Malignant neoplasm of unspecified part of right bronchus or lung: Secondary | ICD-10-CM

## 2018-05-09 DIAGNOSIS — I2510 Atherosclerotic heart disease of native coronary artery without angina pectoris: Secondary | ICD-10-CM | POA: Insufficient documentation

## 2018-05-09 DIAGNOSIS — D3501 Benign neoplasm of right adrenal gland: Secondary | ICD-10-CM | POA: Insufficient documentation

## 2018-05-09 DIAGNOSIS — I700 Atherosclerosis of aorta: Secondary | ICD-10-CM | POA: Insufficient documentation

## 2018-05-09 DIAGNOSIS — J90 Pleural effusion, not elsewhere classified: Secondary | ICD-10-CM | POA: Diagnosis not present

## 2018-05-09 DIAGNOSIS — J439 Emphysema, unspecified: Secondary | ICD-10-CM | POA: Diagnosis not present

## 2018-05-09 MED ORDER — IOPAMIDOL (ISOVUE-300) INJECTION 61%: 75.0000 mL | Freq: Once | INTRAVENOUS | Status: DC | PRN

## 2018-05-09 MED ORDER — IOHEXOL 300 MG/ML  SOLN
75.0000 mL | Freq: Once | INTRAMUSCULAR | Status: AC | PRN
  Administered 2018-05-09: 75 mL via INTRAVENOUS

## 2018-05-14 ENCOUNTER — Encounter: Payer: Self-pay | Admitting: Family

## 2018-05-14 ENCOUNTER — Ambulatory Visit (INDEPENDENT_AMBULATORY_CARE_PROVIDER_SITE_OTHER): Payer: Medicare HMO | Admitting: Family

## 2018-05-14 VITALS — BP 146/73 | HR 74 | Temp 98.3°F | Ht 72.0 in | Wt 195.2 lb

## 2018-05-14 DIAGNOSIS — L821 Other seborrheic keratosis: Secondary | ICD-10-CM | POA: Diagnosis not present

## 2018-05-14 DIAGNOSIS — Z85820 Personal history of malignant melanoma of skin: Secondary | ICD-10-CM | POA: Diagnosis not present

## 2018-05-14 DIAGNOSIS — L570 Actinic keratosis: Secondary | ICD-10-CM | POA: Diagnosis not present

## 2018-05-14 DIAGNOSIS — L814 Other melanin hyperpigmentation: Secondary | ICD-10-CM | POA: Diagnosis not present

## 2018-05-14 DIAGNOSIS — J441 Chronic obstructive pulmonary disease with (acute) exacerbation: Secondary | ICD-10-CM | POA: Diagnosis not present

## 2018-05-14 DIAGNOSIS — Z85828 Personal history of other malignant neoplasm of skin: Secondary | ICD-10-CM | POA: Diagnosis not present

## 2018-05-14 DIAGNOSIS — J449 Chronic obstructive pulmonary disease, unspecified: Secondary | ICD-10-CM | POA: Diagnosis not present

## 2018-05-14 DIAGNOSIS — L309 Dermatitis, unspecified: Secondary | ICD-10-CM | POA: Diagnosis not present

## 2018-05-14 DIAGNOSIS — D485 Neoplasm of uncertain behavior of skin: Secondary | ICD-10-CM | POA: Diagnosis not present

## 2018-05-14 DIAGNOSIS — C4442 Squamous cell carcinoma of skin of scalp and neck: Secondary | ICD-10-CM | POA: Diagnosis not present

## 2018-05-14 MED ORDER — AZITHROMYCIN 250 MG PO TABS: ORAL_TABLET | ORAL | 0 refills | Status: DC

## 2018-05-14 NOTE — Patient Instructions (Signed)

## 2018-05-14 NOTE — Progress Notes (Signed)
   Subjective:    Patient ID: Timothy Guerrero, male    DOB: Jul 19, 1940, 77 y.o.   MRN: [id number]  Chief Complaint  Patient presents with  . Cough    chest congestion    Cough  This is a new problem. The current episode started 1 to 4 weeks ago. The problem has been unchanged. The problem occurs every few minutes. The cough is productive of sputum and productive of purulent sputum. Associated symptoms include nasal congestion, postnasal drip and wheezing. Pertinent negatives include no chills, ear congestion, ear pain, fever, headaches, myalgias, sore throat, shortness of breath or weight loss. The symptoms are aggravated by lying down. He has tried rest and OTC cough suppressant for the symptoms. The treatment provided mild relief. His past medical history is significant for COPD.      Review of Systems  Constitutional: Negative for chills, fever and weight loss.  HENT: Positive for postnasal drip. Negative for ear pain and sore throat.   Respiratory: Positive for cough and wheezing. Negative for shortness of breath.   Musculoskeletal: Negative for myalgias.  Neurological: Negative for headaches.  All other systems reviewed and are negative.      Objective:   Physical Exam  Constitutional: He is oriented to person, place, and time. He appears well-developed and well-nourished. No distress.  HENT:  Head: Normocephalic.  Right Ear: External ear normal.  Left Ear: External ear normal.  Mouth/Throat: Oropharynx is clear and moist.  Eyes: Pupils are equal, round, and reactive to light. Right eye exhibits no discharge. Left eye exhibits no discharge.  Neck: Normal range of motion. Neck supple. No thyromegaly present.  Cardiovascular: Normal rate, regular rhythm, normal heart sounds and intact distal pulses.  No murmur heard. Pulmonary/Chest: Effort normal. No respiratory distress. He has no wheezes. He has rhonchi.  Intermittent coarse cough, nonproductive cough  Abdominal: Soft.  Bowel sounds are normal. He exhibits no distension. There is no tenderness.  Musculoskeletal: Normal range of motion. He exhibits no edema or tenderness.  Neurological: He is alert and oriented to person, place, and time. He has normal reflexes. No cranial nerve deficit.  Skin: Skin is warm and dry. No rash noted. No erythema.  Psychiatric: He has a normal mood and affect. His behavior is normal. Judgment and thought content normal.  Vitals reviewed.        BP (!) 146/73   Pulse 74   Temp 98.3 F (36.8 C) (Oral)   Ht 6' (1.829 m)   Wt 195 lb 3.2 oz (88.5 kg)   BMI 26.47 kg/m   Assessment & Plan:  Timothy Guerrero comes in today with chief complaint of Cough (chest congestion)   Diagnosis and orders addressed:  1. COPD, moderate ([place]) - azithromycin (ZITHROMAX) 250 MG tablet; Take 500 mg once, then 250 mg for four days  Dispense: 6 tablet; Refill: 0  2. COPD exacerbation ([place]) - Take meds as prescribed - Use a cool mist humidifier  -Use saline nose sprays frequently -Force fluids -For any cough or congestion  Use plain Mucinex- regular strength or max strength is fine -For fever or aces or pains- take tylenol or ibuprofen. -Throat lozenges if help -RTO if symptoms worsen or do not improve  - azithromycin (ZITHROMAX) 250 MG tablet; Take 500 mg once, then 250 mg for four days  Dispense: 6 tablet; Refill: 0   Evelina Dun, FNP

## 2018-05-27 ENCOUNTER — Encounter: Payer: Self-pay | Admitting: Family

## 2018-05-27 ENCOUNTER — Ambulatory Visit (INDEPENDENT_AMBULATORY_CARE_PROVIDER_SITE_OTHER): Payer: Medicare HMO | Admitting: Family

## 2018-05-27 VITALS — BP 136/72 | HR 82 | Temp 97.8°F | Ht 72.0 in | Wt 193.6 lb

## 2018-05-27 DIAGNOSIS — J449 Chronic obstructive pulmonary disease, unspecified: Secondary | ICD-10-CM | POA: Diagnosis not present

## 2018-05-27 DIAGNOSIS — E1159 Type 2 diabetes mellitus with other circulatory complications: Secondary | ICD-10-CM | POA: Diagnosis not present

## 2018-05-27 MED ORDER — FLUTICASONE FUROATE-VILANTEROL 200-25 MCG/INH IN AEPB: 1.0000 | INHALATION_SPRAY | Freq: Every day | RESPIRATORY_TRACT | 6 refills | Status: DC

## 2018-05-27 MED ORDER — BENZONATATE 200 MG PO CAPS: 200.0000 mg | ORAL_CAPSULE | Freq: Three times a day (TID) | ORAL | 1 refills | Status: DC | PRN

## 2018-05-27 NOTE — Patient Instructions (Signed)

## 2018-05-27 NOTE — Progress Notes (Signed)
   Subjective:    Patient ID: Timothy Guerrero, Timothy    DOB: 1941/01/23, 77 y.o.   MRN: [id number]  Chief Complaint  Patient presents with  . Cough   Pt presents to the office today for recurrent cough. He was seen on 05/14/18 for COPD and started on ZPak. His last A1C on 04/03/18 was 8.3. He reports his coughing has slightly improved since completing Zpak.   Pt has CT chest on 05/09/18 that was negative for pneumonia.  Cough  This is a recurrent problem. The current episode started 1 to 4 weeks ago. The problem has been gradually worsening. The problem occurs every few minutes. The cough is productive of sputum and productive of purulent sputum. Associated symptoms include wheezing. Pertinent negatives include no chills, ear congestion, ear pain, fever, headaches, myalgias, nasal congestion, rhinorrhea, sore throat or shortness of breath. He has tried rest and OTC cough suppressant (Zpak) for the symptoms. The treatment provided mild relief. His past medical history is significant for COPD.      Review of Systems  Constitutional: Negative for chills and fever.  HENT: Negative for ear pain, rhinorrhea and sore throat.   Respiratory: Positive for cough and wheezing. Negative for shortness of breath.   Musculoskeletal: Negative for myalgias.  Neurological: Negative for headaches.  All other systems reviewed and are negative.      Objective:   Physical Exam  Constitutional: He is oriented to person, place, and time. He appears well-developed and well-nourished. No distress.  HENT:  Head: Normocephalic.  Right Ear: External ear normal.  Left Ear: External ear normal.  Mouth/Throat: Oropharynx is clear and moist.  Eyes: Pupils are equal, round, and reactive to light. Right eye exhibits no discharge. Left eye exhibits no discharge.  Neck: Normal range of motion. Neck supple. No thyromegaly present.  Cardiovascular: Normal rate, regular rhythm, normal heart sounds and intact distal  pulses.  No murmur heard. Pulmonary/Chest: Effort normal and breath sounds normal. No respiratory distress. He has no wheezes.  Intermittent nonproductive cough  Abdominal: Soft. Bowel sounds are normal. He exhibits no distension. There is no tenderness.  Musculoskeletal: Normal range of motion. He exhibits no edema or tenderness.  Neurological: He is alert and oriented to person, place, and time. He has normal reflexes. No cranial nerve deficit.  Skin: Skin is warm and dry. No rash noted. No erythema.  Psychiatric: He has a normal mood and affect. His behavior is normal. Judgment and thought content normal.  Vitals reviewed.    BP 136/72   Pulse 82   Temp 97.8 F (36.6 C) (Oral)   Ht 6' (1.829 m)   Wt 193 lb 9.6 oz (87.8 kg)   BMI 26.26 kg/m      Assessment & Plan:  Timothy Guerrero comes in today with chief complaint of Cough   Diagnosis and orders addressed:  1. COPD, moderate ([place]) Pt started on Breo today Lungs sound improved Can not give prednisone related to uncontrolled DM. States the last time he was on steroids his BS were in the 60's RTO if symptoms worsen or do not improve - fluticasone furoate-vilanterol (BREO ELLIPTA) 200-25 MCG/INH AEPB; Inhale 1 puff into the lungs daily.  Dispense: 1 each; Refill: 6  2. Type 2 diabetes mellitus with other circulatory complication, without long-term current use of insulin (HCC) Increase Lantus to 40 units from 35 untis Strict low carb diet    Evelina Dun, FNP

## 2018-06-10 ENCOUNTER — Ambulatory Visit: Payer: Medicare HMO | Admitting: Family

## 2018-06-13 ENCOUNTER — Other Ambulatory Visit (HOSPITAL_COMMUNITY): Payer: Medicare HMO

## 2018-06-17 ENCOUNTER — Ambulatory Visit (HOSPITAL_COMMUNITY)
Admission: RE | Admit: 2018-06-17 | Discharge: 2018-06-17 | Disposition: A | Discharge status: Home / Self Care | Payer: Medicare HMO | Source: Ambulatory Visit | Attending: Cardiology | Admitting: Cardiology

## 2018-06-17 DIAGNOSIS — I480 Paroxysmal atrial fibrillation: Secondary | ICD-10-CM | POA: Insufficient documentation

## 2018-06-17 DIAGNOSIS — I5022 Chronic systolic (congestive) heart failure: Secondary | ICD-10-CM | POA: Insufficient documentation

## 2018-06-17 NOTE — Progress Notes (Signed)
*  PRELIMINARY RESULTS* Echocardiogram 2D Echocardiogram has been performed.  Timothy Guerrero 06/17/2018, 10:54 AM

## 2018-06-18 ENCOUNTER — Ambulatory Visit: Payer: Medicare HMO | Admitting: Thoracic Surgery (Cardiothoracic Vascular Surgery)

## 2018-06-19 ENCOUNTER — Ambulatory Visit: Payer: Medicare HMO | Admitting: Family

## 2018-06-24 ENCOUNTER — Encounter: Payer: Self-pay | Admitting: Family

## 2018-06-24 ENCOUNTER — Ambulatory Visit (INDEPENDENT_AMBULATORY_CARE_PROVIDER_SITE_OTHER): Payer: Medicare HMO | Admitting: Family

## 2018-06-24 ENCOUNTER — Ambulatory Visit (INDEPENDENT_AMBULATORY_CARE_PROVIDER_SITE_OTHER): Payer: Medicare HMO

## 2018-06-24 VITALS — BP 164/84 | HR 90 | Temp 97.1°F | Ht 72.0 in | Wt 193.0 lb

## 2018-06-24 DIAGNOSIS — R059 Cough, unspecified: Secondary | ICD-10-CM

## 2018-06-24 DIAGNOSIS — R05 Cough: Secondary | ICD-10-CM

## 2018-06-24 DIAGNOSIS — C3491 Malignant neoplasm of unspecified part of right bronchus or lung: Secondary | ICD-10-CM | POA: Diagnosis not present

## 2018-06-24 DIAGNOSIS — J42 Unspecified chronic bronchitis: Secondary | ICD-10-CM | POA: Diagnosis not present

## 2018-06-24 DIAGNOSIS — R0989 Other specified symptoms and signs involving the circulatory and respiratory systems: Secondary | ICD-10-CM | POA: Diagnosis not present

## 2018-06-24 NOTE — Progress Notes (Signed)
Subjective:    Patient ID: Timothy Guerrero, male    DOB: 1940-09-05, 77 y.o.   MRN: [id number]  Chief Complaint  Patient presents with  . Cough    Cough  This is a recurrent problem. The current episode started 1 to 4 weeks ago. The problem has been gradually improving. The problem occurs every few minutes. The cough is productive of sputum. Associated symptoms include myalgias and wheezing. Pertinent negatives include no chills, ear congestion, ear pain, fever, headaches, nasal congestion or shortness of breath. The symptoms are aggravated by lying down. He has tried rest, OTC cough suppressant and OTC inhaler for the symptoms. The treatment provided mild relief. His past medical history is significant for COPD.      Review of Systems  Constitutional: Negative for chills and fever.  HENT: Negative for ear pain.   Respiratory: Positive for cough and wheezing. Negative for shortness of breath.   Musculoskeletal: Positive for myalgias.  Neurological: Negative for headaches.  All other systems reviewed and are negative.      Objective:   Physical Exam Vitals signs reviewed.  Constitutional:      General: He is not in acute distress.    Appearance: He is well-developed.  HENT:     Head: Normocephalic.     Right Ear: Tympanic membrane normal.     Left Ear: Tympanic membrane normal.     Mouth/Throat:     Pharynx: Posterior oropharyngeal erythema present.  Eyes:     General:        Right eye: No discharge.        Left eye: No discharge.     Pupils: Pupils are equal, round, and reactive to light.  Neck:     Musculoskeletal: Normal range of motion and neck supple.     Thyroid: No thyromegaly.  Cardiovascular:     Rate and Rhythm: Normal rate and regular rhythm.     Heart sounds: Normal heart sounds. No murmur.  Pulmonary:     Effort: Pulmonary effort is normal. No respiratory distress.     Breath sounds: Normal breath sounds. No wheezing.     Comments: Intermittent  nonproductive cough Abdominal:     General: Bowel sounds are normal. There is no distension.     Palpations: Abdomen is soft.     Tenderness: There is no abdominal tenderness.  Musculoskeletal: Normal range of motion.        General: No tenderness.  Skin:    General: Skin is warm and dry.     Findings: No erythema or rash.  Neurological:     Mental Status: He is alert and oriented to person, place, and time.     Cranial Nerves: No cranial nerve deficit.     Deep Tendon Reflexes: Reflexes are normal and symmetric.  Psychiatric:        Behavior: Behavior normal.        Thought Content: Thought content normal.        Judgment: Judgment normal.    X-ray- Negative for acute process, Preliminary reading by Evelina Dun, FNP [facility]   BP (!) 164/84   Pulse 90   Temp (!) 97.1 F (36.2 C)   Ht 6' (1.829 m)   Wt 193 lb (87.5 kg)   BMI 26.18 kg/m      Assessment & Plan:  Timothy Guerrero comes in today with chief complaint of Cough   Diagnosis and orders addressed:  1. Cough - DG Chest  2 View; Future  2. Chronic bronchitis, unspecified chronic bronchitis type ([place]) Continue Breo Mucinex as needed Tessalon as needed Pt does not wish to take steroids at this time X-ray negative for acute RTO if symptoms do not improve or worsen   3. Squamous cell carcinoma of right lung [facility]) Keep appt with Oncologists CT scan reviewed from 05/09/18   Evelina Dun, [place], [place]

## 2018-06-24 NOTE — Patient Instructions (Signed)
Chronic Obstructive Pulmonary Disease Chronic obstructive pulmonary disease (COPD) is a long-term (chronic) lung problem. When you have COPD, it is hard for air to get in and out of your lungs. Usually the condition gets worse over time, and your lungs will never return to normal. There are things you can do to keep yourself as healthy as possible.  Your doctor may treat your condition with: ? Medicines. ? Oxygen. ? Lung surgery.  Your doctor may also recommend: ? Rehabilitation. This includes steps to make your body work better. It may involve a team of specialists. ? Quitting smoking, if you smoke. ? Exercise and changes to your diet. ? Comfort measures (palliative care). Follow these instructions at home: Medicines  Take over-the-counter and prescription medicines only as told by your doctor.  Talk to your doctor before taking any cough or allergy medicines. You may need to avoid medicines that cause your lungs to be dry. Lifestyle  If you smoke, stop. Smoking makes the problem worse. If you need help quitting, ask your doctor.  Avoid being around things that make your breathing worse. This may include smoke, chemicals, and fumes.  Stay active, but remember to rest as well.  Learn and use tips on how to relax.  Make sure you get enough sleep. Most adults need at least 7 hours of sleep every night.  Eat healthy foods. Eat smaller meals more often. Rest before meals. Controlled breathing Learn and use tips on how to control your breathing as told by your doctor. Try:  Breathing in (inhaling) through your nose for 1 second. Then, pucker your lips and breath out (exhale) through your lips for 2 seconds.  Putting one hand on your belly (abdomen). Breathe in slowly through your nose for 1 second. Your hand on your belly should move out. Pucker your lips and breathe out slowly through your lips. Your hand on your belly should move in as you breathe out.  Controlled coughing Learn  and use controlled coughing to clear mucus from your lungs. Follow these steps: 1. Lean your head a little forward. 2. Breathe in deeply. 3. Try to hold your breath for 3 seconds. 4. Keep your mouth slightly open while coughing 2 times. 5. Spit any mucus out into a tissue. 6. Rest and do the steps again 1 or 2 times as needed. General instructions  Make sure you get all the shots (vaccines) that your doctor recommends. Ask your doctor about a flu shot and a pneumonia shot.  Use oxygen therapy and pulmonary rehabilitation if told by your doctor. If you need home oxygen therapy, ask your doctor if you should buy a tool to measure your oxygen level (oximeter).  Make a COPD action plan with your doctor. This helps you to know what to do if you feel worse than usual.  Manage any other conditions you have as told by your doctor.  Avoid going outside when it is very hot, cold, or humid.  Avoid people who have a sickness you can catch (contagious).  Keep all follow-up visits as told by your doctor. This is important. Contact a doctor if:  You cough up more mucus than usual.  There is a change in the color or thickness of the mucus.  It is harder to breathe than usual.  Your breathing is faster than usual.  You have trouble sleeping.  You need to use your medicines more often than usual.  You have trouble doing your normal activities such as getting dressed   or walking around the house. Get help right away if:  You have shortness of breath while resting.  You have shortness of breath that stops you from: ? Being able to talk. ? Doing normal activities.  Your chest hurts for longer than 5 minutes.  Your skin color is more blue than usual.  Your pulse oximeter shows that you have low oxygen for longer than 5 minutes.  You have a fever.  You feel too tired to breathe normally. Summary  Chronic obstructive pulmonary disease (COPD) is a long-term lung problem.  The way your  lungs work will never return to normal. Usually the condition gets worse over time. There are things you can do to keep yourself as healthy as possible.  Take over-the-counter and prescription medicines only as told by your doctor.  If you smoke, stop. Smoking makes the problem worse. This information is not intended to replace advice given to you by your health care provider. Make sure you discuss any questions you have with your health care provider. Document Released: 12/06/2007 Document Revised: 07/24/2016 Document Reviewed: 07/24/2016 Elsevier Interactive Patient Education  2019 Elsevier Inc.  

## 2018-07-11 ENCOUNTER — Encounter: Payer: Self-pay | Admitting: Radiation Oncology

## 2018-07-11 ENCOUNTER — Other Ambulatory Visit: Payer: Self-pay

## 2018-07-11 ENCOUNTER — Ambulatory Visit
Admission: RE | Admit: 2018-07-11 | Discharge: 2018-07-11 | Disposition: A | Discharge status: Home / Self Care | Payer: Medicare HMO | Source: Ambulatory Visit | Attending: Radiation Oncology | Admitting: Radiation Oncology

## 2018-07-11 VITALS — BP 185/87 | HR 76 | Temp 97.8°F | Resp 18 | Ht 72.0 in | Wt 195.2 lb

## 2018-07-11 DIAGNOSIS — C3491 Malignant neoplasm of unspecified part of right bronchus or lung: Secondary | ICD-10-CM

## 2018-07-11 DIAGNOSIS — Z7984 Long term (current) use of oral hypoglycemic drugs: Secondary | ICD-10-CM | POA: Diagnosis not present

## 2018-07-11 DIAGNOSIS — R5383 Other fatigue: Secondary | ICD-10-CM | POA: Diagnosis not present

## 2018-07-11 DIAGNOSIS — R0602 Shortness of breath: Secondary | ICD-10-CM | POA: Insufficient documentation

## 2018-07-11 DIAGNOSIS — Z923 Personal history of irradiation: Secondary | ICD-10-CM | POA: Insufficient documentation

## 2018-07-11 DIAGNOSIS — Z79899 Other long term (current) drug therapy: Secondary | ICD-10-CM | POA: Diagnosis not present

## 2018-07-11 DIAGNOSIS — Z85118 Personal history of other malignant neoplasm of bronchus and lung: Secondary | ICD-10-CM | POA: Diagnosis not present

## 2018-07-11 DIAGNOSIS — R0989 Other specified symptoms and signs involving the circulatory and respiratory systems: Secondary | ICD-10-CM | POA: Diagnosis not present

## 2018-07-11 DIAGNOSIS — Z08 Encounter for follow-up examination after completed treatment for malignant neoplasm: Secondary | ICD-10-CM | POA: Diagnosis not present

## 2018-07-11 NOTE — Progress Notes (Signed)
Radiation Oncology         [phone]) [phone] ________________________________  Name: Timothy Guerrero MRN: [id number]  Date: 07/11/2018  DOB: 01-06-1941  Follow-Up Visit Note  CC: Dettinger, Fransisca Kaufmann, MD  Melrose Nakayama, *    ICD-10-CM   1. Squamous cell carcinoma of right lung (HCC) C34.91 CT Chest W Contrast    BUN & Creatinine ([facility])    Diagnosis:   78 y.o. 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 The Chest was treated to 46 Gy in 23 fractions of 2 Gy.  2. The Chest was boosted to 20 Gy in 10 fractions of 2 Gy. (66 Gy)  Narrative:  The patient returns today for routine follow-up.  He underwent a restaging CT scan of the chest on 05/09/18 which showed no residual tracheal mass, evidence of local tumor recurrence in the right lung, or findings of metastatic disease in the chest.               On review of systems, the patient reports that fatigue is still present but improved since radiation. He denies any pain. He reports cough with yellow sputum. He denies green sputum or hemoptysis. He denies chills or fever. He denies any headaches. He reports recent chest congestion and states that it feels like it has settled "right where the radiation was". He had a chest x-ray done for this on 06/24/18 which showed only old granulomatous disease; no active disease. He denies any chest pain. He reports recent EKG that was normal. He reports rare slight shortness of breath. He denies difficulty with swallowing.  ALLERGIES:  has No Known Allergies.  Meds: Current Outpatient Medications  Medication Sig Dispense Refill  . albuterol (PROVENTIL HFA;VENTOLIN HFA) 108 (90 Base) MCG/ACT inhaler Inhale 1 puff into the lungs every 6 (six) hours as needed for wheezing or shortness of breath.    [name] amLODipine (NORVASC) 10 MG tablet Take 1  tablet (10 mg total) by mouth daily. 90 tablet 3  . benzonatate (TESSALON) 200 MG capsule Take 1 capsule (200 mg total) by mouth 3 (three) times daily as needed. 30 capsule 1  . capsaicin (ZOSTRIX) 0.025 % cream Apply 1 application topically 2 (two) times daily as needed (Foot pain).    . fluticasone furoate-vilanterol (BREO ELLIPTA) 200-25 MCG/INH AEPB Inhale 1 puff into the lungs daily. 1 each 6  . furosemide (LASIX) 20 MG tablet Take 1 tablet (20 mg total) by mouth daily. 90 tablet 3  . glipiZIDE (GLUCOTROL) 10 MG tablet     . Insulin Glargine (LANTUS SOLOSTAR) 100 UNIT/ML Solostar Pen Inject 20-40 Units into the skin daily at 10 pm. 5 pen PRN  . levothyroxine (SYNTHROID, LEVOTHROID) 50 MCG tablet Take 1 tablet (50 mcg total) by mouth daily before breakfast. ((for thyroid)) 90 tablet 3  . LORazepam (ATIVAN) 1 MG tablet Take 1 tablet (1 mg total) by mouth every 8 (eight) hours. For anxiety, Take 1 hour prior up coming head scan. 15 tablet 0  . magnesium oxide (MAG-OX) 400 MG tablet Take 400 mg by mouth 2 (two) times daily.    . metFORMIN (GLUCOPHAGE) 1000 MG tablet Take 1 tablet (1,000 mg total) by mouth 2 (two) times daily with a meal. 180 tablet 3  . metoprolol (TOPROL-XL) 200 MG 24 hr tablet Take 1 tablet (  200 mg total) by mouth daily. 90 tablet 3  . olmesartan (BENICAR) 40 MG tablet Take 1 tablet (40 mg total) by mouth daily. 90 tablet 3  . pantoprazole (PROTONIX) 40 MG tablet TAKE 1 TABLET BY MOUTH EVERY DAY 90 tablet 1  . Semaglutide (OZEMPIC) 0.25 or 0.5 MG/DOSE SOPN Inject 0.5 mg into the skin once a week. 12 pen 1  . sertraline (ZOLOFT) 50 MG tablet Take 1 tablet (50 mg total) by mouth daily. 30 tablet 5  . sucralfate (CARAFATE) 1 GM/10ML suspension Take 10 mLs (1 g total) by mouth 4 (four) times daily -  with meals and at bedtime. 420 mL 0  . Tiotropium Bromide-Olodaterol (STIOLTO RESPIMAT IN) Inhale 1 puff into the lungs daily as needed (shortness of breath).    . triamcinolone cream  (KENALOG) 0.1 % Apply 1 application topically 2 (two) times daily. 30 g 0  . valACYclovir (VALTREX) 1000 MG tablet Take 1,000 mg by mouth daily as needed (cold sores).     . Potassium Chloride ER 20 MEQ TBCR Take 20 mEq by mouth daily. Take only on days when taking lasix 90 tablet 0   No current facility-administered medications for this encounter.     Physical Findings: The patient is in no acute distress. Patient is alert and oriented.  height is 6' (1.829 m) and weight is 195 lb 3.2 oz (88.5 kg). His oral temperature is 97.8 F (36.6 C). His blood pressure is 185/87 (abnormal) and his pulse is 76. His respiration is 18 and oxygen saturation is 97%.   Lungs are clear to auscultation bilaterally. Heart has regular rate and rhythm. No palpable cervical, supraclavicular, or axillary adenopathy. Abdomen soft, non-tender, normal bowel sounds.  Lab Findings: Lab Results  Component Value Date   WBC 6.9 04/03/2018   HGB 14.8 04/03/2018   HCT 44.3 04/03/2018   MCV 91 04/03/2018   PLT 254 04/03/2018    Radiographic Findings: Dg Chest 2 View  Result Date: 06/24/2018 CLINICAL DATA:  Congestion EXAM: CHEST - 2 VIEW COMPARISON:  11/01/2017 FINDINGS: Calcified right infrahilar lymph nodes or granulomas. Calcified granulomas peripherally in the right mid lung. Heart is normal size. No effusions or confluent opacities. No acute bony abnormality. IMPRESSION: Old granulomatous disease.  No active disease. Electronically Signed   By: Rolm Baptise M.D.   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 Chest CT scan in November showed no active disease. Overall the patient is feeling well. He's had recent chest congestion but chest x-ray showed no issues.  Plan:  Patient will be scheduled for chest CT scan in May [facility]. Follow up soon afterward.  ____________________________________  Blair Promise, PhD,  MD  This document serves as a record of services personally performed by Gery Pray, MD. It was created on his behalf by Rae Lips, a trained medical scribe. The creation of this record is based on the scribe's personal observations and the provider's statements to them. This document has been checked and approved by the attending provider.

## 2018-07-11 NOTE — Progress Notes (Signed)
Pt presents today for f/u with Dr. Sondra Come. Pt is unaccompanied. Pt reports that fatigue is still present but improved since radiation. Pt denies c/o pain. Pt with cough with yellow sputum. Pt denies hemoptysis. Pt reports recent "chest cold" and states that it feels like it has settled "right where the radiation was". Pt reports recent EKG. Pt reports rare slight SOB. Pt denies difficulty swallowing.   BP (!) 185/87 (BP Location: Left Arm, Patient Position: Sitting)   Pulse 76   Temp 97.8 F (36.6 C) (Oral)   Resp 18   Ht 6' (1.829 m)   Wt 195 lb 3.2 oz (88.5 kg)   SpO2 97%   BMI 26.47 kg/m   Wt Readings from Last 3 Encounters:  07/11/18 195 lb 3.2 oz (88.5 kg)  06/24/18 193 lb (87.5 kg)  05/27/18 193 lb 9.6 oz (87.8 kg)   Loma Sousa, RN BSN

## 2018-07-25 ENCOUNTER — Other Ambulatory Visit: Payer: Self-pay | Admitting: *Deleted

## 2018-07-25 MED ORDER — METOPROLOL SUCCINATE ER 200 MG PO TB24: 200.0000 mg | ORAL_TABLET | Freq: Every day | ORAL | 0 refills | Status: DC

## 2018-07-30 ENCOUNTER — Other Ambulatory Visit: Payer: Self-pay | Admitting: *Deleted

## 2018-07-30 MED ORDER — PANTOPRAZOLE SODIUM 40 MG PO TBEC: 40.0000 mg | DELAYED_RELEASE_TABLET | Freq: Every day | ORAL | 0 refills | Status: DC

## 2018-08-05 ENCOUNTER — Other Ambulatory Visit: Payer: Self-pay | Admitting: *Deleted

## 2018-08-05 MED ORDER — GLIPIZIDE 10 MG PO TABS: 10.0000 mg | ORAL_TABLET | Freq: Every day | ORAL | 0 refills | Status: DC

## 2018-08-05 MED ORDER — FUROSEMIDE 20 MG PO TABS: 20.0000 mg | ORAL_TABLET | Freq: Every day | ORAL | 0 refills | Status: DC

## 2018-08-19 ENCOUNTER — Other Ambulatory Visit: Payer: Self-pay | Admitting: *Deleted

## 2018-08-19 MED ORDER — LEVOTHYROXINE SODIUM 50 MCG PO TABS: 50.0000 ug | ORAL_TABLET | Freq: Every day | ORAL | 2 refills | Status: DC

## 2018-08-20 DIAGNOSIS — L570 Actinic keratosis: Secondary | ICD-10-CM | POA: Diagnosis not present

## 2018-08-20 DIAGNOSIS — Z85820 Personal history of malignant melanoma of skin: Secondary | ICD-10-CM | POA: Diagnosis not present

## 2018-08-20 DIAGNOSIS — L821 Other seborrheic keratosis: Secondary | ICD-10-CM | POA: Diagnosis not present

## 2018-08-20 DIAGNOSIS — Z85828 Personal history of other malignant neoplasm of skin: Secondary | ICD-10-CM | POA: Diagnosis not present

## 2018-08-26 ENCOUNTER — Encounter: Payer: Self-pay | Admitting: Family Medicine

## 2018-08-26 ENCOUNTER — Ambulatory Visit (INDEPENDENT_AMBULATORY_CARE_PROVIDER_SITE_OTHER): Payer: Medicare HMO | Admitting: Family Medicine

## 2018-08-26 VITALS — BP 137/79 | HR 82 | Temp 97.0°F | Ht 73.0 in | Wt 199.6 lb

## 2018-08-26 DIAGNOSIS — E1159 Type 2 diabetes mellitus with other circulatory complications: Secondary | ICD-10-CM | POA: Diagnosis not present

## 2018-08-26 DIAGNOSIS — E039 Hypothyroidism, unspecified: Secondary | ICD-10-CM | POA: Diagnosis not present

## 2018-08-26 DIAGNOSIS — I10 Essential (primary) hypertension: Secondary | ICD-10-CM

## 2018-08-26 LAB — BAYER DCA HB A1C WAIVED: HB A1C (BAYER DCA - WAIVED): 9.2 % — ABNORMAL HIGH (ref <7.0)

## 2018-08-26 MED ORDER — SERTRALINE HCL 50 MG PO TABS: 50.0000 mg | ORAL_TABLET | Freq: Every day | ORAL | 3 refills | Status: DC

## 2018-08-26 MED ORDER — LORAZEPAM 1 MG PO TABS: 1.0000 mg | ORAL_TABLET | Freq: Every evening | ORAL | 2 refills | Status: DC | PRN

## 2018-08-26 MED ORDER — METOPROLOL SUCCINATE ER 200 MG PO TB24: 200.0000 mg | ORAL_TABLET | Freq: Every day | ORAL | 3 refills | Status: DC

## 2018-08-26 MED ORDER — OLMESARTAN MEDOXOMIL 5 MG PO TABS: 5.0000 mg | ORAL_TABLET | Freq: Every day | ORAL | 3 refills | Status: DC

## 2018-08-26 MED ORDER — PANTOPRAZOLE SODIUM 40 MG PO TBEC: 40.0000 mg | DELAYED_RELEASE_TABLET | Freq: Every day | ORAL | 3 refills | Status: DC

## 2018-08-26 NOTE — Progress Notes (Signed)
BP 137/79   Pulse 82   Temp (!) 97 F (36.1 C) (Oral)   Ht 6\' 1"  (1.854 m)   Wt 199 lb 9.6 oz (90.5 kg)   BMI 26.33 kg/m    Subjective:    Patient ID: Timothy Guerrero, male    DOB: 25-Apr-1941, 78 y.o.   MRN: [id number]  HPI: Timothy Guerrero is a 78 y.o. male presenting on 08/26/2018 for Diabetes (3 month follow up) and COPD   HPI Type 2 diabetes mellitus Patient comes in today for recheck of his diabetes. Patient has been currently taking NovoLog 70/30, 30 units twice daily, metformin and Ozempic. Patient is currently on an ACE inhibitor/ARB. Patient has not seen an ophthalmologist this year. Patient denies any issues with their feet.   Hypothyroidism recheck Patient is coming in for thyroid recheck today as well. They deny any issues with hair changes or heat or cold problems or diarrhea or constipation. They deny any chest pain or palpitations. They are currently on levothyroxine 34micrograms   Hypertension Patient is currently on amlodipine, I thought it been on olmesartan but he says he was not taking it, we will lower the olmesartan down to 5 mg instead of 40 because he is not been taking it and his blood pressures been good and this will be more renal protective., and their blood pressure today is 137/79. Patient denies any lightheadedness or dizziness. Patient denies headaches, blurred vision, chest pains, shortness of breath, or weakness. Denies any side effects from medication and is content with current medication.   Relevant past medical, surgical, family and social history reviewed and updated as indicated. Interim medical history since our last visit reviewed. Allergies and medications reviewed and updated.  Review of Systems  Constitutional: Negative for chills and fever.  Respiratory: Negative for shortness of breath and wheezing.   Cardiovascular: Negative for chest pain and leg swelling.  Musculoskeletal: Negative for back pain and gait problem.  Skin:  Negative for rash.  Neurological: Negative for dizziness, weakness and numbness.  All other systems reviewed and are negative.   Per HPI unless specifically indicated above   Allergies as of 08/26/2018   No Known Allergies     Medication List       Accurate as of August 26, 2018  9:59 AM. Always use your most recent med list.        albuterol 108 (90 Base) MCG/ACT inhaler Commonly known as:  PROVENTIL HFA;VENTOLIN HFA Inhale 1 puff into the lungs every 6 (six) hours as needed for wheezing or shortness of breath.   amLODipine 10 MG tablet Commonly known as:  NORVASC Take 1 tablet (10 mg total) by mouth daily.   capsaicin 0.025 % cream Commonly known as:  ZOSTRIX Apply 1 application topically 2 (two) times daily as needed (Foot pain).   fluticasone furoate-vilanterol 200-25 MCG/INH Aepb Commonly known as:  BREO ELLIPTA Inhale 1 puff into the lungs daily.   furosemide 20 MG tablet Commonly known as:  LASIX Take 1 tablet (20 mg total) by mouth daily.   insulin aspart protamine- aspart (70-30) 100 UNIT/ML injection Commonly known as:  NOVOLOG MIX 70/30 Inject into the skin.   levothyroxine 50 MCG tablet Commonly known as:  SYNTHROID, LEVOTHROID Take 1 tablet (50 mcg total) by mouth daily before breakfast. ((for thyroid))   LORazepam 1 MG tablet Commonly known as:  ATIVAN Take 1 tablet (1 mg total) by mouth at bedtime as needed for anxiety. For  anxiety, Take 1 hour prior up coming head scan.   magnesium oxide 400 MG tablet Commonly known as:  MAG-OX Take 400 mg by mouth 2 (two) times daily.   metFORMIN 1000 MG tablet Commonly known as:  GLUCOPHAGE Take 1 tablet (1,000 mg total) by mouth 2 (two) times daily with a meal.   metoprolol 200 MG 24 hr tablet Commonly known as:  TOPROL-XL Take 1 tablet (200 mg total) by mouth daily.   olmesartan 5 MG tablet Commonly known as:  BENICAR Take 1 tablet (5 mg total) by mouth daily.   pantoprazole 40 MG  tablet Commonly known as:  PROTONIX Take 1 tablet (40 mg total) by mouth daily.   Potassium Chloride ER 20 MEQ Tbcr Take 20 mEq by mouth daily. Take only on days when taking lasix   rosuvastatin 20 MG tablet Commonly known as:  CRESTOR Take 20 mg by mouth daily.   Semaglutide(0.25 or 0.5MG /DOS) 2 MG/1.5ML Sopn Commonly known as:  OZEMPIC (0.25 OR 0.5 MG/DOSE) Inject 0.5 mg into the skin once a week.   sertraline 50 MG tablet Commonly known as:  ZOLOFT Take 1 tablet (50 mg total) by mouth daily.   STIOLTO RESPIMAT IN Inhale 1 puff into the lungs daily as needed (shortness of breath).   triamcinolone cream 0.1 % Commonly known as:  KENALOG Apply 1 application topically 2 (two) times daily.   VALTREX 1000 MG tablet Generic drug:  valACYclovir Take 1,000 mg by mouth daily as needed (cold sores).          Objective:    BP 137/79   Pulse 82   Temp (!) 97 F (36.1 C) (Oral)   Ht 6\' 1"  (1.854 m)   Wt 199 lb 9.6 oz (90.5 kg)   BMI 26.33 kg/m   Wt Readings from Last 3 Encounters:  08/26/18 199 lb 9.6 oz (90.5 kg)  07/11/18 195 lb 3.2 oz (88.5 kg)  06/24/18 193 lb (87.5 kg)    Physical Exam Vitals signs and nursing note reviewed.  Constitutional:      General: He is not in acute distress.    Appearance: He is well-developed. He is not diaphoretic.  Eyes:     General: No scleral icterus.    Conjunctiva/sclera: Conjunctivae normal.  Neck:     Musculoskeletal: Neck supple.     Thyroid: No thyromegaly.  Cardiovascular:     Rate and Rhythm: Normal rate and regular rhythm.     Heart sounds: Normal heart sounds. No murmur.  Pulmonary:     Effort: Pulmonary effort is normal. No respiratory distress.     Breath sounds: Normal breath sounds. No wheezing.  Musculoskeletal: Normal range of motion.  Lymphadenopathy:     Cervical: No cervical adenopathy.  Skin:    General: Skin is warm and dry.     Findings: No rash.  Neurological:     Mental Status: He is alert and  oriented to person, place, and time.     Coordination: Coordination normal.  Psychiatric:        Behavior: Behavior normal.         Assessment & Plan:   Problem List Items Addressed This Visit      Cardiovascular and Mediastinum   Essential hypertension   Relevant Medications   rosuvastatin (CRESTOR) 20 MG tablet   metoprolol (TOPROL-XL) 200 MG 24 hr tablet   olmesartan (BENICAR) 5 MG tablet     Endocrine   Diabetes mellitus ([place]) - Primary  Relevant Medications   rosuvastatin (CRESTOR) 20 MG tablet   insulin aspart protamine- aspart (NOVOLOG MIX 70/30) (70-30) 100 UNIT/ML injection   olmesartan (BENICAR) 5 MG tablet   Other Relevant Orders   Bayer DCA Hb A1c Waived   Hypothyroidism   Relevant Medications   metoprolol (TOPROL-XL) 200 MG 24 hr tablet      Will add a lower dose of telmisartan for renal protection, will check A1c today.  Follow up plan: Return in about 3 months (around 11/24/2018), or if symptoms worsen or fail to improve, for Type 2 diabetes recheck.  Counseling provided for all of the vaccine components Orders Placed This Encounter  Procedures  . B[facility] Hb A1c [place], MD [facility] 08/26/2018, 9:59 AM

## 2018-09-06 ENCOUNTER — Other Ambulatory Visit: Payer: Self-pay | Admitting: Family Medicine

## 2018-10-05 ENCOUNTER — Other Ambulatory Visit: Payer: Self-pay | Admitting: Family Medicine

## 2018-10-18 ENCOUNTER — Other Ambulatory Visit: Payer: Self-pay

## 2018-10-18 ENCOUNTER — Encounter: Payer: Self-pay | Admitting: Family Medicine

## 2018-10-18 ENCOUNTER — Ambulatory Visit (INDEPENDENT_AMBULATORY_CARE_PROVIDER_SITE_OTHER): Payer: Medicare HMO | Admitting: Family Medicine

## 2018-10-18 DIAGNOSIS — I10 Essential (primary) hypertension: Secondary | ICD-10-CM | POA: Diagnosis not present

## 2018-10-18 DIAGNOSIS — E039 Hypothyroidism, unspecified: Secondary | ICD-10-CM

## 2018-10-18 DIAGNOSIS — I5022 Chronic systolic (congestive) heart failure: Secondary | ICD-10-CM

## 2018-10-18 DIAGNOSIS — R05 Cough: Secondary | ICD-10-CM

## 2018-10-18 DIAGNOSIS — R053 Chronic cough: Secondary | ICD-10-CM

## 2018-10-18 DIAGNOSIS — E1159 Type 2 diabetes mellitus with other circulatory complications: Secondary | ICD-10-CM | POA: Diagnosis not present

## 2018-10-18 MED ORDER — BENZONATATE 100 MG PO CAPS: 100.0000 mg | ORAL_CAPSULE | Freq: Two times a day (BID) | ORAL | 0 refills | Status: DC | PRN

## 2018-10-18 MED ORDER — POTASSIUM CHLORIDE ER 20 MEQ PO TBCR: 20.0000 meq | EXTENDED_RELEASE_TABLET | Freq: Every day | ORAL | 2 refills | Status: DC

## 2018-10-18 NOTE — Progress Notes (Signed)
Virtual Visit via telephone Note  I connected with Timothy Guerrero on 10/18/18 at [phone] by telephone and verified that I am speaking with the correct person using two identifiers. Timothy Guerrero is currently located at home and no other people are currently with her during visit. The provider, Fransisca Kaufmann Burdett Pinzon, MD is located in their office at time of visit.  Call ended at [place]  I discussed the limitations, risks, security and privacy concerns of performing an evaluation and management service by telephone and the availability of in person appointments. I also discussed with the patient that there may be a patient responsible charge related to this service. The patient expressed understanding and agreed to proceed.   History and Present Illness: Patient has had a cough that has been persistent for 6 weeks and been mostly dry.  He coughs some and then it will go away.   Hypertension Patient is currently on Olmesartan and amlodipine, and their blood pressure today is 113/70. Patient denies any lightheadedness or dizziness. Patient denies headaches, blurred vision, chest pains, shortness of breath, or weakness. Denies any side effects from medication and is content with current medication.   Hypothyroidism recheck Patient is coming in for thyroid recheck today as well. They deny any issues with hair changes or heat or cold problems or diarrhea or constipation. They deny any chest pain or palpitations. They are currently on levothyroxine 21micrograms   Type 2 diabetes mellitus Patient comes in today for recheck of his diabetes. Patient has been currently taking metformin and ozempic. Patient is currently on an ACE inhibitor/ARB. Patient has not seen an ophthalmologist this year. Patient denies any issues with their feet.   No diagnosis found.  Outpatient Encounter Medications as of 10/18/2018  Medication Sig  . albuterol (PROVENTIL HFA;VENTOLIN HFA) 108 (90 Base) MCG/ACT inhaler Inhale  1 puff into the lungs every 6 (six) hours as needed for wheezing or shortness of breath.  [name] amLODipine (NORVASC) 10 MG tablet Take 1 tablet (10 mg total) by mouth daily.  . capsaicin (ZOSTRIX) 0.025 % cream Apply 1 application topically 2 (two) times daily as needed (Foot pain).  . fluticasone furoate-vilanterol (BREO ELLIPTA) 200-25 MCG/INH AEPB Inhale 1 puff into the lungs daily.  . furosemide (LASIX) 20 MG tablet Take 1 tablet (20 mg total) by mouth daily.  . insulin aspart protamine- aspart (NOVOLOG MIX 70/30) (70-30) 100 UNIT/ML injection Inject into the skin.  [name] levothyroxine (SYNTHROID, LEVOTHROID) 50 MCG tablet Take 1 tablet (50 mcg total) by mouth daily before breakfast. ((for thyroid))  . LORazepam (ATIVAN) 1 MG tablet Take 1 tablet (1 mg total) by mouth at bedtime as needed for anxiety. For anxiety, Take 1 hour prior up coming head scan.  . magnesium oxide (MAG-OX) 400 MG tablet Take 400 mg by mouth 2 (two) times daily.  . metFORMIN (GLUCOPHAGE) 1000 MG tablet Take 1 tablet (1,000 mg total) by mouth 2 (two) times daily with a meal.  . metoprolol (TOPROL-XL) 200 MG 24 hr tablet Take 1 tablet (200 mg total) by mouth daily.  [name] olmesartan (BENICAR) 5 MG tablet Take 1 tablet (5 mg total) by mouth daily.  . pantoprazole (PROTONIX) 40 MG tablet Take 1 tablet (40 mg total) by mouth daily.  . Potassium Chloride ER 20 MEQ TBCR Take 20 mEq by mouth daily. Take only on days when taking lasix  . rosuvastatin (CRESTOR) 20 MG tablet Take 20 mg by mouth daily.  . Semaglutide (OZEMPIC) 0.25  or 0.5 MG/DOSE SOPN Inject 0.5 mg into the skin once a week. (Patient taking differently: Inject 1 mg into the skin once a week. )  . sertraline (ZOLOFT) 50 MG tablet Take 1 tablet (50 mg total) by mouth daily.  . Tiotropium Bromide-Olodaterol (STIOLTO RESPIMAT IN) Inhale 1 puff into the lungs daily as needed (shortness of breath).  . triamcinolone cream (KENALOG) 0.1 % Apply 1 application topically 2 (two) times  daily.  . valACYclovir (VALTREX) 1000 MG tablet Take 1,000 mg by mouth daily as needed (cold sores).    No facility-administered encounter medications on file as of 10/18/2018.     Review of Systems  Constitutional: Negative for chills and fever.  Eyes: Negative for visual disturbance.  Respiratory: Negative for shortness of breath and wheezing.   Cardiovascular: Negative for chest pain and leg swelling.  Musculoskeletal: Negative for back pain and gait problem.  Skin: Negative for rash.  Neurological: Positive for numbness (feet and neuropathy). Negative for dizziness, weakness and light-headedness.  All other systems reviewed and are negative.   Observations/Objective: Patient sounds comfortable and in no acute distress  Assessment and Plan: Problem List Items Addressed This Visit      Cardiovascular and Mediastinum   Essential hypertension   Chronic systolic HF (heart failure) (HCC)   Relevant Medications   Potassium Chloride ER 20 MEQ TBCR     Endocrine   Diabetes mellitus ([place])   Hypothyroidism - Primary    Other Visit Diagnoses    Persistent cough       Relevant Medications   benzonatate (TESSALON) 100 MG capsule       Follow Up Instructions:  Follow-up in 3 months for recheck for diabetes and hypertension  With patient's history discussed that with the persistent cough he needs to probably talk to his pulmonologist but sent Tessalon Perles just in case.   I discussed the assessment and treatment plan with the patient. The patient was provided an opportunity to ask questions and all were answered. The patient agreed with the plan and demonstrated an understanding of the instructions.   The patient was advised to call back or seek an in-person evaluation if the symptoms worsen or if the condition fails to improve as anticipated.  The above assessment and management plan was discussed with the patient. The patient verbalized understanding of and has agreed to the  management plan. Patient is aware to call the clinic if symptoms persist or worsen. Patient is aware when to return to the clinic for a follow-up visit. Patient educated on when it is appropriate to go to the emergency department.    I provided 12 minutes of non-face-to-face time during this encounter.    Worthy Rancher, MD

## 2018-11-12 ENCOUNTER — Telehealth: Payer: Self-pay | Admitting: *Deleted

## 2018-11-12 NOTE — Telephone Encounter (Signed)
CALLED PATIENT TO INFORM OF LAB AND CT TO BE DONE ON 11-20-18 - ARRIVAL TIME- 12:45 PM @ [facility], PT. TO NOT HAVE ANY FOOD - 4 HRS. PRIOR TO TEST, FU TO BE WITH DR. KINARD ON 11-21-18 @ 10:30 AM FOR RESULTS, LVM FOR A RETURN CALL

## 2018-11-20 ENCOUNTER — Other Ambulatory Visit: Payer: Self-pay

## 2018-11-20 ENCOUNTER — Ambulatory Visit (HOSPITAL_COMMUNITY)
Admission: RE | Admit: 2018-11-20 | Discharge: 2018-11-20 | Disposition: A | Discharge status: Home / Self Care | Payer: Medicare HMO | Source: Ambulatory Visit | Attending: Radiation Oncology | Admitting: Radiation Oncology

## 2018-11-20 DIAGNOSIS — C3491 Malignant neoplasm of unspecified part of right bronchus or lung: Secondary | ICD-10-CM | POA: Diagnosis present

## 2018-11-20 DIAGNOSIS — J432 Centrilobular emphysema: Secondary | ICD-10-CM | POA: Diagnosis not present

## 2018-11-20 LAB — POCT I-STAT CREATININE: Creatinine, Ser: 0.9 mg/dL (ref 0.61–1.24)

## 2018-11-20 MED ORDER — IOHEXOL 300 MG/ML  SOLN
75.0000 mL | Freq: Once | INTRAMUSCULAR | Status: AC | PRN
  Administered 2018-11-20: 75 mL via INTRAVENOUS

## 2018-11-21 ENCOUNTER — Ambulatory Visit
Admission: RE | Admit: 2018-11-21 | Discharge: 2018-11-21 | Disposition: A | Discharge status: Home / Self Care | Payer: Medicare HMO | Source: Ambulatory Visit | Attending: Radiation Oncology | Admitting: Radiation Oncology

## 2018-11-21 ENCOUNTER — Other Ambulatory Visit: Payer: Self-pay

## 2018-11-21 ENCOUNTER — Encounter: Payer: Self-pay | Admitting: Radiation Oncology

## 2018-11-21 VITALS — BP 167/81 | HR 100 | Temp 98.2°F | Resp 18 | Ht 73.0 in | Wt 204.5 lb

## 2018-11-21 DIAGNOSIS — Z85118 Personal history of other malignant neoplasm of bronchus and lung: Secondary | ICD-10-CM | POA: Diagnosis not present

## 2018-11-21 DIAGNOSIS — I2510 Atherosclerotic heart disease of native coronary artery without angina pectoris: Secondary | ICD-10-CM | POA: Diagnosis not present

## 2018-11-21 DIAGNOSIS — Z7984 Long term (current) use of oral hypoglycemic drugs: Secondary | ICD-10-CM | POA: Diagnosis not present

## 2018-11-21 DIAGNOSIS — Z79899 Other long term (current) drug therapy: Secondary | ICD-10-CM | POA: Insufficient documentation

## 2018-11-21 DIAGNOSIS — J449 Chronic obstructive pulmonary disease, unspecified: Secondary | ICD-10-CM | POA: Diagnosis not present

## 2018-11-21 DIAGNOSIS — I700 Atherosclerosis of aorta: Secondary | ICD-10-CM | POA: Insufficient documentation

## 2018-11-21 DIAGNOSIS — C3431 Malignant neoplasm of lower lobe, right bronchus or lung: Secondary | ICD-10-CM | POA: Insufficient documentation

## 2018-11-21 DIAGNOSIS — Z08 Encounter for follow-up examination after completed treatment for malignant neoplasm: Secondary | ICD-10-CM | POA: Diagnosis not present

## 2018-11-21 DIAGNOSIS — C3491 Malignant neoplasm of unspecified part of right bronchus or lung: Secondary | ICD-10-CM

## 2018-11-21 NOTE — Progress Notes (Signed)
Pt presents today for f/u with Dr. Sondra Come. Pt had CT Chest yesterday and is for results. Pt reports cough, occasionally productive with yellow sputum. Denies hemoptysis. Pt denies difficulty swallowing. Pt reports SOB that is related to COPD.  BP (!) 167/81 (BP Location: Left Arm, Patient Position: Sitting)   Pulse 100   Temp 98.2 F (36.8 C) (Oral)   Resp 18   Ht 6\' 1"  (0.981 m)   Wt 204 lb 8 oz (92.8 kg)   SpO2 97%   BMI 26.98 kg/m   Wt Readings from Last 3 Encounters:  11/21/18 204 lb 8 oz (92.8 kg)  08/26/18 199 lb 9.6 oz (90.5 kg)  07/11/18 195 lb 3.2 oz (88.5 kg)   Loma Sousa, RN BSN

## 2018-11-21 NOTE — Patient Instructions (Signed)
Coronavirus (COVID-19) Are you at risk?  Are you at risk for the Coronavirus (COVID-19)?  To be considered HIGH RISK for Coronavirus (COVID-19), you have to meet the following criteria:  . Traveled to [place], [place], [place], [place] or [place]; or in the [place] to [place], [place], [place], or [place]; and have fever, cough, and shortness of breath within the last 2 weeks of travel OR . Been in close contact with a person diagnosed with COVID-19 within the last 2 weeks and have fever, cough, and shortness of breath . IF YOU DO NOT MEET THESE CRITERIA, YOU ARE CONSIDERED LOW RISK FOR COVID-19.  What to do if you are HIGH RISK for COVID-19?  . If you are having a medical emergency, call 911. . Seek medical care right away. Before you go to a doctor's office, urgent care or emergency department, call ahead and tell them about your recent travel, contact with someone diagnosed with COVID-19, and your symptoms. You should receive instructions from your physician's office regarding next steps of care.  . When you arrive at healthcare provider, tell the healthcare staff immediately you have returned from visiting [place], [place], [place], [place] or [place]; or traveled in the [place] to [place], [place], [place], or [place]; in the last two weeks or you have been in close contact with a person diagnosed with COVID-19 in the last 2 weeks.   . Tell the health care staff about your symptoms: fever, cough and shortness of breath. . After you have been seen by a medical provider, you will be either: o Tested for (COVID-19) and discharged home on quarantine except to seek medical care if symptoms worsen, and asked to  - Stay home and avoid contact with others until you get your results (4-5 days)  - Avoid travel on public transportation if possible (such as bus, train, or airplane) or o Sent to the Emergency Department by EMS for evaluation, COVID-19 testing, and possible  admission depending on your condition and test results.  What to do if you are LOW RISK for COVID-19?  Reduce your risk of any infection by using the same precautions used for avoiding the common cold or flu:  . Wash your hands often with soap and warm water for at least 20 seconds.  If soap and water are not readily available, use an alcohol-based hand sanitizer with at least 60% alcohol.  . If coughing or sneezing, cover your mouth and nose by coughing or sneezing into the elbow areas of your shirt or coat, into a tissue or into your sleeve (not your hands). . Avoid shaking hands with others and consider head nods or verbal greetings only. . Avoid touching your eyes, nose, or mouth with unwashed hands.  . Avoid close contact with people who are sick. . Avoid places or events with large numbers of people in one location, like concerts or sporting events. . Carefully consider travel plans you have or are making. . If you are planning any travel outside or inside the [place], visit the CDC's Travelers' Health webpage for the latest health notices. . If you have some symptoms but not all symptoms, continue to monitor at home and seek medical attention if your symptoms worsen. . If you are having a medical emergency, call 911.   ADDITIONAL HEALTHCARE OPTIONS FOR PATIENTS  [facility] / e-Visit: [email or web address]         [facility]: [phone]  [facility]: [phone]                   [facility]: [phone]   

## 2018-11-21 NOTE — Progress Notes (Signed)
Radiation Oncology         [phone]) [phone] ________________________________  Name: Timothy Guerrero MRN: [id number]  Date: 11/21/2018  DOB: 1940/09/06  Follow-Up Visit Note  CC: Dettinger, Fransisca Kaufmann, MD  Melrose Nakayama, *    ICD-10-CM   1. Squamous cell carcinoma of right lung (HCC) C34.91     Diagnosis:   78 y.o. 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 The Chest was treated to 46 Gy in 23 fractions of 2 Gy. 2. The Chest was boosted to 20 Gy in 10 fractions of 2 Gy.(66 Gy)  Narrative:  The patient returns today for routine follow-up to review recent imaging results.  He underwent chest CT scan yesterday, 11/20/2018 which showed stable postoperative changes of right lower lobectomy without findings to suggest recurrent or metastatic disease in the thorax.               On review of systems, the patient reports cough, occasionally productive with yellow sputum. He denies hemoptysis. He denies difficulty swallowing. He reports shortness of breath that is related to COPD. He complains of discomfort along the right parasternal area.   ALLERGIES:  has No Known Allergies.  Meds: Current Outpatient Medications  Medication Sig Dispense Refill  . albuterol (PROVENTIL HFA;VENTOLIN HFA) 108 (90 Base) MCG/ACT inhaler Inhale 1 puff into the lungs every 6 (six) hours as needed for wheezing or shortness of breath.    [name] amLODipine (NORVASC) 10 MG tablet Take 1 tablet (10 mg total) by mouth daily. 90 tablet 3  . benzonatate (TESSALON) 100 MG capsule Take 1 capsule (100 mg total) by mouth 2 (two) times daily as needed for cough. 20 capsule 0  . capsaicin (ZOSTRIX) 0.025 % cream Apply 1 application topically 2 (two) times daily as needed (Foot pain).    . fluticasone furoate-vilanterol (BREO ELLIPTA) 200-25 MCG/INH AEPB Inhale 1 puff  into the lungs daily. 1 each 6  . furosemide (LASIX) 20 MG tablet Take 1 tablet (20 mg total) by mouth daily. 30 tablet 0  . gabapentin (NEURONTIN) 300 MG capsule Take 300 mg by mouth 3 (three) times daily.    . insulin aspart protamine- aspart (NOVOLOG MIX 70/30) (70-30) 100 UNIT/ML injection Inject into the skin.    [name] levothyroxine (SYNTHROID, LEVOTHROID) 50 MCG tablet Take 1 tablet (50 mcg total) by mouth daily before breakfast. ((for thyroid)) 90 tablet 2  . LORazepam (ATIVAN) 1 MG tablet Take 1 tablet (1 mg total) by mouth at bedtime as needed for anxiety. For anxiety, Take 1 hour prior up coming head scan. 15 tablet 2  . magnesium oxide (MAG-OX) 400 MG tablet Take 400 mg by mouth 2 (two) times daily.    . meloxicam (MOBIC) 15 MG tablet Take 15 mg by mouth daily.    . metFORMIN (GLUCOPHAGE) 1000 MG tablet Take 1 tablet (1,000 mg total) by mouth 2 (two) times daily with a meal. 180 tablet 3  . metoprolol (TOPROL-XL) 200 MG 24 hr tablet Take 1 tablet (200 mg total) by mouth daily. 90 tablet 3  . olmesartan (BENICAR) 5 MG tablet Take 1 tablet (5 mg total) by mouth daily. 90 tablet 3  . pantoprazole (PROTONIX) 40 MG tablet Take 1 tablet (40 mg total) by mouth daily. 90 tablet 3  . Potassium Chloride ER 20 MEQ TBCR Take 20 mEq by mouth daily. Take  only on days when taking lasix 90 tablet 2  . rosuvastatin (CRESTOR) 20 MG tablet Take 20 mg by mouth daily.    . Semaglutide (OZEMPIC) 0.25 or 0.5 MG/DOSE SOPN Inject 0.5 mg into the skin once a week. (Patient taking differently: Inject 1 mg into the skin once a week. ) 12 pen 1  . sertraline (ZOLOFT) 50 MG tablet Take 1 tablet (50 mg total) by mouth daily. 90 tablet 3  . Tiotropium Bromide-Olodaterol (STIOLTO RESPIMAT IN) Inhale 1 puff into the lungs daily as needed (shortness of breath).    . triamcinolone cream (KENALOG) 0.1 % Apply 1 application topically 2 (two) times daily. 30 g 0  . valACYclovir (VALTREX) 1000 MG tablet Take 1,000 mg by mouth  daily as needed (cold sores).      No current facility-administered medications for this encounter.     Physical Findings: The patient is in no acute distress. Patient is alert and oriented.  height is 6\' 1"  (1.854 m) and weight is 204 lb 8 oz (92.8 kg). His oral temperature is 98.2 F (36.8 C). His blood pressure is 167/81 (abnormal) and his pulse is 100. His respiration is 18 and oxygen saturation is 97%.   Lungs are clear to auscultation bilaterally. Heart has regular rate and rhythm. No palpable cervical, supraclavicular, or axillary adenopathy. Abdomen soft, non-tender, normal bowel sounds.  Careful palpation along the right parasternal area reveals no palpable mass or discomfort.  Lab Findings: Lab Results  Component Value Date   WBC 6.9 04/03/2018   HGB 14.8 04/03/2018   HCT 44.3 04/03/2018   MCV 91 04/03/2018   PLT 254 04/03/2018    Radiographic Findings: Ct Chest W Contrast  Result Date: 11/21/2018 CLINICAL DATA:  78 year old male with history of non-small cell lung cancer. EXAM: CT CHEST WITH CONTRAST TECHNIQUE: Multidetector CT imaging of the chest was performed during intravenous contrast administration. CONTRAST:  53mL OMNIPAQUE IOHEXOL 300 MG/ML  SOLN COMPARISON:  Chest CT 05/09/2018. FINDINGS: Cardiovascular: Heart size is normal. There is no significant pericardial fluid, thickening or pericardial calcification. There is aortic atherosclerosis, as well as atherosclerosis of the great vessels of the mediastinum and the coronary arteries, including calcified atherosclerotic plaque in the left main, left anterior descending, left circumflex and right coronary arteries. Calcifications of the aortic valve. Mediastinum/Nodes: No pathologically enlarged mediastinal or hilar lymph nodes. Esophagus is unremarkable in appearance. No axillary lymphadenopathy. Lungs/Pleura: Status post right lower lobectomy. Compensatory hyperexpansion of the right middle and upper lobes. Calcified  pleural plaques bilaterally. A few scattered 2-3 mm pulmonary nodules are noted throughout the periphery of the lungs bilaterally, nonspecific and statistically likely benign. No larger more suspicious appearing pulmonary nodules or masses are noted. Diffuse bronchial wall thickening with mild centrilobular and paraseptal emphysema. No acute consolidative airspace disease. No pleural effusions. Upper Abdomen: 1.6 x 2.0 cm right adrenal nodule is intermediate attenuation (47 HU), incompletely characterized, but similar to prior examinations dating back to 2012, presumably a benign lesions such as a lipid poor adenoma. Incompletely imaged low-attenuation lesion in the upper pole of the right kidney measuring at least 3.8 cm in diameter, similar to numerous prior examinations, most compatible with a simple cyst. Aortic atherosclerosis. Musculoskeletal: Status post left shoulder arthroplasty. There are no aggressive appearing lytic or blastic lesions noted in the visualized portions of the skeleton. IMPRESSION: 1. Stable postoperative changes of right lower lobectomy without findings to suggest recurrent or metastatic disease in the thorax. 2. Calcified pleural plaques bilaterally indicative  of asbestos related pleural disease. 3. Aortic atherosclerosis, in addition to left main and 3 vessel coronary artery disease. Assessment for potential risk factor modification, dietary therapy or pharmacologic therapy may be warranted, if clinically indicated. 4. Mild diffuse bronchial wall thickening with mild centrilobular and paraseptal emphysema; imaging findings suggestive of underlying COPD. 5. There are calcifications of the aortic valve. Echocardiographic correlation for evaluation of potential valvular dysfunction may be warranted if clinically indicated. 6. Additional incidental findings, as above. Aortic Atherosclerosis (ICD10-I70.0) and Emphysema (ICD10-J43.9). Electronically Signed   By: Vinnie Langton M.D.   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 No evidence of recurrence on clinical exam or recent chest CT scan.  Plan:  Return in 6 months and repeat a chest CT scan at that time.  ____________________________________  Blair Promise, PhD, MD  This document serves as a record of services personally performed by Gery Pray, MD. It was created on his behalf by Rae Lips, a trained medical scribe. The creation of this record is based on the scribe's personal observations and the provider's statements to them. This document has been checked and approved by the attending provider.

## 2018-12-05 ENCOUNTER — Telehealth: Payer: Self-pay | Admitting: *Deleted

## 2018-12-05 NOTE — Telephone Encounter (Signed)
Called pt to change his office visit 12/11/2018 with Dr. Percival Spanish, to a virtual visit and get virtual consent. Left a message for pt to call back.

## 2018-12-11 ENCOUNTER — Ambulatory Visit: Payer: Medicare HMO | Admitting: Cardiology

## 2018-12-27 ENCOUNTER — Telehealth: Payer: Self-pay | Admitting: *Deleted

## 2018-12-27 NOTE — Telephone Encounter (Signed)
    COVID-19 Pre-Screening Questions:  . In the past 7 to 10 days have you had a cough,  shortness of breath, headache, congestion, fever (100 or greater) body aches, chills, sore throat, or sudden loss of taste or sense of smell? . Have you been around anyone with known Covid 19. . Have you been around anyone who is awaiting Covid 19 test results in the past 7 to 10 days? . Have you been around anyone who has been exposed to Covid 19, or has mentioned symptoms of Covid 19 within the past 7 to 10 days?  If you have any concerns/questions about symptoms patients report during screening (either on the phone or at threshold). Contact the provider seeing the patient or DOD for further guidance.  If neither are available contact a member of the leadership team.          Patient answered no to all questions. He know to wear a mask, only arrive 15 minutes early,  to call if his health changes, and to come into the office alone. CP/cma

## 2018-12-31 NOTE — Progress Notes (Signed)
Cardiology Office Note   Date:  01/01/2019   ID:  Timothy Guerrero, DOB 1940-11-14, MRN [id number]  PCP:  Dettinger, Fransisca Kaufmann, MD  Cardiologist:   Timothy Breeding, MD   Chief Complaint  Patient presents with  . Coronary Artery Disease     History of Present Illness: Timothy Guerrero is a 78 y.o. male who presents for follow-up of atrial flutter.  [facility] did not think ablation was indicated. In the past he's had nonobstructive disease and small vessel disease with high-grade stenosis and a nondominant right coronary artery on cath in 2013 in the [place]. He had a stress test in 2015 which was negative. He's been managed medically. He's had a well preserved ejection fraction.  He has had continued problems with HTN and has had adjustments to his meds. He has been anemic. He was on anticoagulation at that time and this was stopped. He actually required transfusion.   An echo in December 2017 demonstrated an EF of 25 - 30%.   Perfusion study demonstrated an EF of 35% with a fixed medium sized mid inferior perfusion defect.  He has remained off of anticoagulation because of his bleed.   He refused atrial flutter ablation.  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  He started radiation therapy.  Late last year I repeated an echo and his EF was 60%.  Since I last saw him he has done well.  The patient denies any new symptoms such as chest discomfort, neck or arm discomfort. There has been no new shortness of breath, PND or orthopnea. There have been no reported palpitations, presyncope or syncope.   He has chronic dyspnea related to his COPD.   Past Medical History:  Diagnosis Date  . Anemia   . Arthritis   . Atrial flutter ([name])    post op following lobectomy  . Benign essential tremor   . Blood transfusion without reported diagnosis   . Cataract   . CHF (congestive heart failure) ([place])   . Colon polyp    2007, polyp with early cancer, one  year f/u no residual polyp. next TCS 2010, see PSH. [facility].  [name] COPD (chronic obstructive pulmonary disease) ([place])   . Diabetes mellitus without complication ([place])    10 years  . GERD (gastroesophageal reflux disease)   . HOH (hard of hearing)   . Hypercholesteremia   . Hypertension    10 years  . Lung cancer ([place])   . Prostate cancer ([place])   . Shortness of breath dyspnea    due to lung mass  . Skin melanoma ([place])   . Sleep apnea    wears CPAP sometimes, cannot tolerate all the time.    Past Surgical History:  Procedure Laterality Date  . bottom lobe of right lung removed  12/17/14  . CARDIAC CATHETERIZATION     no PCI  . CATARACT EXTRACTION Left   . CATARACT EXTRACTION W/PHACO Left 01/03/2016   Procedure: CATARACT EXTRACTION PHACO AND INTRAOCULAR LENS PLACEMENT LEFT EYE CDE=7.78;  Surgeon: Williams Che, MD;  Location: [facility];  Service: Ophthalmology;  Laterality: Left;  . CATARACT EXTRACTION W/PHACO Right 03/20/2016   Procedure: CATARACT EXTRACTION PHACO AND INTRAOCULAR LENS PLACEMENT; CDE:  9.58;  Surgeon: Williams Che, MD;  Location: [facility];  Service: Ophthalmology;  Laterality: Right;  . COLONOSCOPY  08/2008   Dr. Leafy Half, [place]  [facility]: normal, internal grade 1 hemorrhoids. next TCS 08/2013  . COLONOSCOPY N/A 03/04/2014   Procedure: COLONOSCOPY;  Surgeon: Daneil Dolin, MD;  Location: [facility];  Service: Endoscopy;  Laterality: N/A;  7:30am  . EYE SURGERY    . knee replacements     bilateral. over 10 years ago  . LOBECTOMY Right 12/17/2014   Procedure: RIGHT LOWER LOBECTOMY;  Surgeon: Melrose Nakayama, MD;  Location: [facility];  Service: Thoracic;  Laterality: Right;  . LYMPH NODE DISSECTION Right 12/17/2014   Procedure: LYMPH NODE DISSECTION;  Surgeon: Melrose Nakayama, MD;  Location: [facility];  Service: Thoracic;  Laterality: Right;  . NECK SURGERY     melanoma removed.  [name] PROSTATE SURGERY     prostatectomy, radical  . TOTAL  HIP ARTHROPLASTY  2007   left  . total shoulder replacement  2011   left  . VIDEO ASSISTED THORACOSCOPY (VATS)/WEDGE RESECTION Right 12/17/2014   Procedure: RIGHT VIDEO ASSISTED THORACOSCOPY (VATS);  Surgeon: Melrose Nakayama, MD;  Location: [facility];  Service: Thoracic;  Laterality: Right;  [name] VIDEO BRONCHOSCOPY Bilateral 11/17/2016   Procedure: VIDEO BRONCHOSCOPY WITHOUT FLUORO;  Surgeon: Javier Glazier, MD;  Location: [facility];  Service: Cardiopulmonary;  Laterality: Bilateral;  . VIDEO BRONCHOSCOPY N/A 11/01/2017   Procedure: VIDEO BRONCHOSCOPY;  Surgeon: Melrose Nakayama, MD;  Location: [facility];  Service: Thoracic;  Laterality: N/A;     Current Outpatient Medications  Medication Sig Dispense Refill  . albuterol (PROVENTIL HFA;VENTOLIN HFA) 108 (90 Base) MCG/ACT inhaler Inhale 1 puff into the lungs every 6 (six) hours as needed for wheezing or shortness of breath.    [name] amLODipine (NORVASC) 10 MG tablet Take 1 tablet (10 mg total) by mouth daily. 90 tablet 3  . benzonatate (TESSALON) 100 MG capsule Take 1 capsule (100 mg total) by mouth 2 (two) times daily as needed for cough. 20 capsule 0  . fluticasone furoate-vilanterol (BREO ELLIPTA) 200-25 MCG/INH AEPB Inhale 1 puff into the lungs daily. 1 each 6  . furosemide (LASIX) 20 MG tablet Take 1 tablet (20 mg total) by mouth daily. 30 tablet 0  . gabapentin (NEURONTIN) 300 MG capsule Take 300 mg by mouth 3 (three) times daily.    . insulin aspart protamine- aspart (NOVOLOG MIX 70/30) (70-30) 100 UNIT/ML injection Inject into the skin.    [name] levothyroxine (SYNTHROID, LEVOTHROID) 50 MCG tablet Take 1 tablet (50 mcg total) by mouth daily before breakfast. ((for thyroid)) 90 tablet 2  . LORazepam (ATIVAN) 1 MG tablet Take 1 tablet (1 mg total) by mouth at bedtime as needed for anxiety. For anxiety, Take 1 hour prior up coming head scan. 15 tablet 2  . magnesium oxide (MAG-OX) 400 MG tablet Take 400 mg by mouth 2 (two) times daily.    .  meloxicam (MOBIC) 15 MG tablet Take 15 mg by mouth daily.    . metFORMIN (GLUCOPHAGE) 1000 MG tablet Take 1 tablet (1,000 mg total) by mouth 2 (two) times daily with a meal. 180 tablet 3  . metoprolol (TOPROL-XL) 200 MG 24 hr tablet Take 1 tablet (200 mg total) by mouth daily. 90 tablet 3  . olmesartan (BENICAR) 5 MG tablet Take 1 tablet (5 mg total) by mouth daily. 90 tablet 3  . pantoprazole (PROTONIX) 40 MG tablet Take 1 tablet (40 mg total) by mouth daily. 90 tablet 3  . Potassium Chloride ER 20 MEQ TBCR Take 20 mEq by mouth daily. Take only on days  when taking lasix 90 tablet 2  . rosuvastatin (CRESTOR) 20 MG tablet Take 20 mg by mouth daily.    . Semaglutide (OZEMPIC) 0.25 or 0.5 MG/DOSE SOPN Inject 0.5 mg into the skin once a week. (Patient taking differently: Inject 1 mg into the skin once a week. ) 12 pen 1  . sertraline (ZOLOFT) 50 MG tablet Take 1 tablet (50 mg total) by mouth daily. 90 tablet 3  . Tiotropium Bromide-Olodaterol (STIOLTO RESPIMAT IN) Inhale 1 puff into the lungs daily as needed (shortness of breath).    . triamcinolone cream (KENALOG) 0.1 % Apply 1 application topically 2 (two) times daily. 30 g 0  . valACYclovir (VALTREX) 1000 MG tablet Take 1,000 mg by mouth daily as needed (cold sores).      No current facility-administered medications for this visit.     Allergies:   Patient has no known allergies.    ROS:  Please see the history of present illness.   Otherwise, review of systems are positive for none.   All other systems are reviewed and negative.    PHYSICAL EXAM: VS:  BP 98/64   Pulse 93   Ht 6\' 1"  (1.854 m)   Wt 200 lb (90.7 kg)   BMI 26.39 kg/m  , BMI Body mass index is 26.39 kg/m.  GENERAL:  Well appearing NECK:  No jugular venous distention, waveform within normal limits, carotid upstroke brisk and symmetric, no bruits, no thyromegaly LUNGS:  Clear to auscultation bilaterally CHEST:  Unremarkable HEART:  PMI not displaced or sustained,S1 and S2  within normal limits, no S3, no S4, no clicks, no rubs, no murmurs ABD:  Flat, positive bowel sounds normal in frequency in pitch, no bruits, no rebound, no guarding, no midline pulsatile mass, no hepatomegaly, no splenomegaly EXT:  2 plus pulses throughout, no edema, no cyanosis no clubbing   EKG:  EKG is done today. Sinus rhythm, rate 88, axis within normal limits, intervals within normal limits, no acute ST-T wave changes.  Recent Labs: 04/03/2018: ALT 11; BUN 14; Hemoglobin 14.8; Platelets 254; Potassium 4.4; Sodium 141; TSH 2.150 11/20/2018: Creatinine, Ser 0.90    Lipid Panel    Component Value Date/Time   CHOL 169 04/03/2018 1458   TRIG 134 04/03/2018 1458   HDL 40 04/03/2018 1458   CHOLHDL 4.2 04/03/2018 1458   LDLCALC 102 (H) 04/03/2018 1458      Wt Readings from Last 3 Encounters:  01/01/19 200 lb (90.7 kg)  11/21/18 204 lb 8 oz (92.8 kg)  08/26/18 199 lb 9.6 oz (90.5 kg)     Other studies Reviewed: Additional studies/ records that were reviewed today include:  Hospital records Review of the above records demonstrates:      ASSESSMENT AND PLAN:   ATRIAL FLUTTER: Timothy Guerrero has a CHA2DS2 - VASc score of 4 .  He has had no recurrent tachypalpitations.  Ablation in the past.  He has been deemed to be high risk for anticoagulation.  No change in therapy.   HTN:   His blood pressure is at target.  No change in therapy.  Today's elevation is an aberration.   CAD:  The patient has no new sypmtoms.  No further cardiovascular testing is indicated.  We will continue with aggressive risk reduction and meds as listed.  CHRONIC SYSTOLIC HF:    EF was normal in December.   He has no new symptoms.  No change in therapy.  DM:   His A1c  has gone down to 9.2.  He is working on a 35.   DYSLIPIDEMIA: LDL was mildly above target at 102 in October.  He can get this checked again and he might need to go to 40 mg of Crestor if he is not below 7.  Current medicines are  reviewed at length with the patient today.  The patient does have concerns regarding medicines.  The following changes have been made:  None  Labs/ tests ordered today include:   None  Orders Placed This Encounter  Procedures  . EKG 12-Lead     Disposition:   FU with me in 12 months.     Signed, Timothy Breeding, MD  01/01/2019 2:04 PM     [facility]

## 2019-01-01 ENCOUNTER — Ambulatory Visit (INDEPENDENT_AMBULATORY_CARE_PROVIDER_SITE_OTHER): Payer: Medicare HMO | Admitting: Cardiology

## 2019-01-01 ENCOUNTER — Encounter: Payer: Self-pay | Admitting: Cardiology

## 2019-01-01 ENCOUNTER — Other Ambulatory Visit: Payer: Self-pay

## 2019-01-01 VITALS — BP 98/64 | HR 93 | Ht 73.0 in | Wt 200.0 lb

## 2019-01-01 DIAGNOSIS — I480 Paroxysmal atrial fibrillation: Secondary | ICD-10-CM | POA: Diagnosis not present

## 2019-01-01 DIAGNOSIS — I2510 Atherosclerotic heart disease of native coronary artery without angina pectoris: Secondary | ICD-10-CM

## 2019-01-01 DIAGNOSIS — I5022 Chronic systolic (congestive) heart failure: Secondary | ICD-10-CM

## 2019-01-01 NOTE — Patient Instructions (Signed)
Medication Instructions:  The current medical regimen is effective;  continue present plan and medications.  If you need a refill on your cardiac medications before your next appointment, please call your pharmacy.   Follow-Up: Follow up in 1 year with Dr. Percival Spanish.  You will receive a letter in the mail 2 months before you are due.  Please call us when you receive this letter to schedule your follow up appointment.  Thank you for choosing !!

## 2019-01-07 ENCOUNTER — Encounter: Payer: Self-pay | Admitting: Physician Assistant

## 2019-01-07 ENCOUNTER — Telehealth: Payer: Self-pay | Admitting: Family Medicine

## 2019-01-07 ENCOUNTER — Telehealth: Payer: Self-pay | Admitting: Cardiology

## 2019-01-07 ENCOUNTER — Ambulatory Visit (INDEPENDENT_AMBULATORY_CARE_PROVIDER_SITE_OTHER): Payer: Medicare HMO | Admitting: Physician Assistant

## 2019-01-07 DIAGNOSIS — M7989 Other specified soft tissue disorders: Secondary | ICD-10-CM

## 2019-01-07 NOTE — Progress Notes (Signed)
Telephone visit  Subjective: CC: Leg swelling PCP: Dettinger, Fransisca Kaufmann, MD [id number] Timothy Guerrero is a 78 y.o. male calls for telephone consult today. Patient provides verbal consent for consult held via phone.  Patient is identified with 2 separate identifiers.  At this time the entire area is on COVID-19 social distancing and stay home orders are in place.  Patient is of higher risk and therefore we are performing this by a virtual method.  Location of patient: Home Location of provider: [facility] Others present for call: No  This patient states that in the past day he has had some swelling in his ankle.  This is happened 2 times in the past few weeks.  He denies any changes with his medications or activities.  He did wear compression hose when he is had procedures done in the past.  He does take a fluid pill at this time.  He has not had any increase in sodium.  He did call the cardiologist and they want him to get his legs up as much as possible and he is going to try that over the next few days.  And have asked him to give [place] a call back if things do not improve.   ROS: Per HPI  No Known Allergies Past Medical History:  Diagnosis Date  . Anemia   . Arthritis   . Atrial flutter ([place])    post op following lobectomy  . Benign essential tremor   . Blood transfusion without reported diagnosis   . Cataract   . CHF (congestive heart failure) ([place])   . Colon polyp    2007, polyp with early cancer, one year f/u no residual polyp. next TCS 2010, see PSH. [facility].  [name] COPD (chronic obstructive pulmonary disease) ([place])   . Diabetes mellitus without complication ([place])    10 years  . GERD (gastroesophageal reflux disease)   . HOH (hard of hearing)   . Hypercholesteremia   . Hypertension    10 years  . Lung cancer ([place])   . Prostate cancer ([place])   . Shortness of breath dyspnea    due to lung mass  . Skin melanoma ([place])   . Sleep apnea    wears CPAP sometimes,  cannot tolerate all the time.    Current Outpatient Medications:  .  albuterol (PROVENTIL HFA;VENTOLIN HFA) 108 (90 Base) MCG/ACT inhaler, Inhale 1 puff into the lungs every 6 (six) hours as needed for wheezing or shortness of breath., Disp: , Rfl:  .  amLODipine (NORVASC) 10 MG tablet, Take 1 tablet (10 mg total) by mouth daily., Disp: 90 tablet, Rfl: 3 .  benzonatate (TESSALON) 100 MG capsule, Take 1 capsule (100 mg total) by mouth 2 (two) times daily as needed for cough., Disp: 20 capsule, Rfl: 0 .  fluticasone furoate-vilanterol (BREO ELLIPTA) 200-25 MCG/INH AEPB, Inhale 1 puff into the lungs daily., Disp: 1 each, Rfl: 6 .  furosemide (LASIX) 20 MG tablet, Take 1 tablet (20 mg total) by mouth daily., Disp: 30 tablet, Rfl: 0 .  gabapentin (NEURONTIN) 300 MG capsule, Take 300 mg by mouth 3 (three) times daily., Disp: , Rfl:  .  insulin aspart protamine- aspart (NOVOLOG MIX 70/30) (70-30) 100 UNIT/ML injection, Inject into the skin., Disp: , Rfl:  .  levothyroxine (SYNTHROID, LEVOTHROID) 50 MCG tablet, Take 1 tablet (50 mcg total) by mouth daily before breakfast. ((for thyroid)), Disp: 90 tablet, Rfl: 2 .  LORazepam (ATIVAN) 1  MG tablet, Take 1 tablet (1 mg total) by mouth at bedtime as needed for anxiety. For anxiety, Take 1 hour prior up coming head scan., Disp: 15 tablet, Rfl: 2 .  losartan (COZAAR) 50 MG tablet, Take 25 mg by mouth daily., Disp: , Rfl:  .  magnesium oxide (MAG-OX) 400 MG tablet, Take 400 mg by mouth 2 (two) times daily., Disp: , Rfl:  .  meloxicam (MOBIC) 15 MG tablet, Take 15 mg by mouth daily., Disp: , Rfl:  .  metFORMIN (GLUCOPHAGE) 1000 MG tablet, Take 1 tablet (1,000 mg total) by mouth 2 (two) times daily with a meal., Disp: 180 tablet, Rfl: 3 .  metoprolol (TOPROL-XL) 200 MG 24 hr tablet, Take 1 tablet (200 mg total) by mouth daily., Disp: 90 tablet, Rfl: 3 .  olmesartan (BENICAR) 5 MG tablet, Take 1 tablet (5 mg total) by mouth daily., Disp: 90 tablet, Rfl: 3 .   pantoprazole (PROTONIX) 40 MG tablet, Take 1 tablet (40 mg total) by mouth daily., Disp: 90 tablet, Rfl: 3 .  Potassium Chloride ER 20 MEQ TBCR, Take 20 mEq by mouth daily. Take only on days when taking lasix, Disp: 90 tablet, Rfl: 2 .  rosuvastatin (CRESTOR) 20 MG tablet, Take 20 mg by mouth daily., Disp: , Rfl:  .  Semaglutide (OZEMPIC) 0.25 or 0.5 MG/DOSE SOPN, Inject 0.5 mg into the skin once a week. (Patient taking differently: Inject 1 mg into the skin once a week. ), Disp: 12 pen, Rfl: 1 .  sertraline (ZOLOFT) 50 MG tablet, Take 1 tablet (50 mg total) by mouth daily., Disp: 90 tablet, Rfl: 3 .  Tiotropium Bromide-Olodaterol (STIOLTO RESPIMAT IN), Inhale 1 puff into the lungs daily as needed (shortness of breath)., Disp: , Rfl:  .  triamcinolone cream (KENALOG) 0.1 %, Apply 1 application topically 2 (two) times daily., Disp: 30 g, Rfl: 0 .  valACYclovir (VALTREX) 1000 MG tablet, Take 1,000 mg by mouth daily as needed (cold sores). , Disp: , Rfl:   Assessment/ Plan: 78 y.o. male   1. Leg swelling Continue current medication Wear compression hose as needed Call if anything worsens Elevate legs   No follow-ups on file.  Continue all other maintenance medications as listed above.  Start time: 9:44 AM End time: 9:52 AM  No orders of the defined types were placed in this encounter.   Particia Nearing PA-C [place] [phone]

## 2019-01-07 NOTE — Telephone Encounter (Signed)
[facility] wanted to [facility] you [facility], I added to his list.  Also, pt has not been taking rosuvastatin/  ran out and didn't notice. I told him to reorder it through his pharmacy and the provider can refill and do necessary labs if its time. Pt discussed he is having bilateral ankle swelling x 4 days that goes away by morning. This occurs in late afternoon. Denies any other sx/sob, wt gain.  After review, pt is sitting up in chair and taking a nap around 5-6 pm with legs down/ dangling.  Pt was told to elevate legs during the day and at nap time, continue to monitor, call with continuation or changes. Pt was happy with conversation and verbalized understanding.

## 2019-01-07 NOTE — Telephone Encounter (Signed)
New message  Pt c/o swelling: STAT is pt has developed SOB within 24 hours  1) How much weight have you gained and in what time span? No has not gained any weight   2) If swelling, where is the swelling located? Feet and ankles   3) Are you currently taking a fluid pill? Yes   4) Are you currently SOB? Patient has copd   5) Do you have a log of your daily weights (if so, list)? Yes   6) Have you gained 3 pounds in a day or 5 pounds in a week? no  7) Have you traveled recently? No

## 2019-01-08 NOTE — Telephone Encounter (Signed)
Agree thank you 

## 2019-01-09 ENCOUNTER — Telehealth: Payer: Self-pay | Admitting: Family Medicine

## 2019-01-09 NOTE — Telephone Encounter (Signed)
Returned patient's phone call.  Patient states that he is having a hard time getting BS readings down.  Patient states he is taking 35 units of Novolog 30/70 twice daily.  Today's AM Fasting BS reading 281 and Yesterday's PM reading 317.  Patient states that he at [employer], can green beans, breaded squash for dinner last night.  Nothing to eat this AM

## 2019-01-09 NOTE — Telephone Encounter (Signed)
Patient aware.

## 2019-01-09 NOTE — Telephone Encounter (Signed)
Have him go ahead and increase his morning NovoLog to 40 units and keep 30 5 in the evening and let me know how it goes over the next week.

## 2019-01-16 DIAGNOSIS — Z029 Encounter for administrative examinations, unspecified: Secondary | ICD-10-CM

## 2019-01-23 ENCOUNTER — Other Ambulatory Visit: Payer: Self-pay | Admitting: Family Medicine

## 2019-01-23 MED ORDER — FUROSEMIDE 20 MG PO TABS: 20.0000 mg | ORAL_TABLET | Freq: Every day | ORAL | 0 refills | Status: DC

## 2019-01-23 NOTE — Telephone Encounter (Signed)
Pt aware refill sent to pharmacy 

## 2019-03-04 ENCOUNTER — Telehealth: Payer: Self-pay | Admitting: Family Medicine

## 2019-03-04 NOTE — Telephone Encounter (Signed)
Refer to previous phone call  

## 2019-03-04 NOTE — Telephone Encounter (Signed)
appt scheduled Pt notified 

## 2019-03-04 NOTE — Telephone Encounter (Signed)
Second call

## 2019-03-05 ENCOUNTER — Ambulatory Visit (INDEPENDENT_AMBULATORY_CARE_PROVIDER_SITE_OTHER): Payer: Medicare HMO | Admitting: Family Medicine

## 2019-03-05 NOTE — Progress Notes (Signed)
Patient never answered his phone and was unable to contact

## 2019-03-06 ENCOUNTER — Encounter: Payer: Self-pay | Admitting: Family Medicine

## 2019-03-06 ENCOUNTER — Ambulatory Visit (INDEPENDENT_AMBULATORY_CARE_PROVIDER_SITE_OTHER): Payer: Medicare HMO | Admitting: Family Medicine

## 2019-03-06 DIAGNOSIS — R509 Fever, unspecified: Secondary | ICD-10-CM

## 2019-03-06 MED ORDER — LEVOFLOXACIN 500 MG PO TABS: 500.0000 mg | ORAL_TABLET | Freq: Every day | ORAL | 0 refills | Status: DC

## 2019-03-06 NOTE — Progress Notes (Signed)
Subjective:    Patient ID: Timothy Guerrero, male    DOB: 1940/10/31, 78 y.o.   MRN: [id number]   HPI: Timothy Guerrero is a 78 y.o. male presenting for intermittent fever as high as 104 with chills, no appetite. Not coughing. [facility] was [facility]. COVID tests on 8/25 was negative. Another done yesterday is pending. No dx or tx from [place] as yet.   Aching all over, weak, no energy. Denies chest tightness or dyspnea.   Depression screen [facility] 2/9 08/26/2018 05/27/2018 05/14/2018 04/19/2018 04/03/2018  Decreased Interest 0 0 0 1 0  Down, Depressed, Hopeless 1 0 0 1 3  PHQ - 2 Score 1 0 0 2 3  Altered sleeping - - - 1 3  Tired, decreased energy - - - 1 3  Change in appetite - - - 0 3  Feeling bad or failure about yourself  - - - 1 0  Trouble concentrating - - - 1 3  Moving slowly or fidgety/restless - - - 1 3  Suicidal thoughts - - - 0 1  PHQ-9 Score - - - 7 19  Some recent data might be hidden     Relevant past medical, surgical, family and social history reviewed and updated as indicated.  Interim medical history since our last visit reviewed. Allergies and medications reviewed and updated.  ROS:  Review of Systems  Constitutional: Positive for fatigue and fever.  HENT: Negative.   Eyes: Negative for visual disturbance.  Respiratory: Negative for cough and shortness of breath.   Cardiovascular: Negative for chest pain and leg swelling.  Gastrointestinal: Positive for diarrhea (last week) and vomiting (once yesterday. Diarrhea last week.). Negative for abdominal pain and nausea.  Genitourinary: Negative for difficulty urinating.  Musculoskeletal: Negative for arthralgias and myalgias.  Skin: Negative for rash.  Neurological: Negative for headaches.  Psychiatric/Behavioral: Negative for sleep disturbance.     Social History   Tobacco Use  Smoking Status Former Smoker  . Packs/day: 2.00  . Years: 32.00  . Pack years: 64.00  . Types: Cigarettes  . Quit  date: 07/03/1986  . Years since quitting: 32.6  Smokeless Tobacco Never Used       Objective:     Wt Readings from Last 3 Encounters:  01/01/19 200 lb (90.7 kg)  11/21/18 204 lb 8 oz (92.8 kg)  08/26/18 199 lb 9.6 oz (90.5 kg)     Exam deferred. Pt. [place] due to COVID 19. Phone visit performed.   Assessment & Plan:   1. Fever and chills     Meds ordered this encounter  Medications  . levofloxacin (LEVAQUIN) 500 MG tablet    Sig: Take 1 tablet (500 mg total) by mouth daily. For 10 days    Dispense:  10 tablet    Refill:  0    No orders of the defined types were placed in this encounter.     Diagnoses and all orders for this visit:  Fever and chills  Other orders -     levofloxacin (LEVAQUIN) 500 MG tablet; Take 1 tablet (500 mg total) by mouth daily. For 10 days    Virtual Visit via telephone Note  I discussed the limitations, risks, security and privacy concerns of performing an evaluation and management service by telephone and the availability of in person appointments. The patient was identified with two identifiers. Pt.expressed understanding and agreed to proceed. Pt. Is at home. Dr. Livia Snellen is in his  office.  Follow Up Instructions:   I discussed the assessment and treatment plan with the patient. The patient was provided an opportunity to ask questions and all were answered. The patient agreed with the plan and demonstrated an understanding of the instructions.   The patient was advised to call back or seek an in-person evaluation if the symptoms worsen or if the condition fails to improve as anticipated.   Total minutes including chart review and phone contact time: 20   Follow up plan: Return if symptoms worsen or fail to improve.  Claretta Fraise, MD [place]

## 2019-03-07 ENCOUNTER — Telehealth: Payer: Self-pay | Admitting: Family Medicine

## 2019-03-07 ENCOUNTER — Other Ambulatory Visit: Payer: Medicare HMO

## 2019-03-07 ENCOUNTER — Other Ambulatory Visit: Payer: Self-pay | Admitting: *Deleted

## 2019-03-07 ENCOUNTER — Other Ambulatory Visit: Payer: Self-pay

## 2019-03-07 DIAGNOSIS — R300 Dysuria: Secondary | ICD-10-CM | POA: Diagnosis not present

## 2019-03-07 NOTE — Telephone Encounter (Signed)
Urine and culture were ordered. Patient aware.

## 2019-03-07 NOTE — Telephone Encounter (Signed)
Per Dettinger okay to order urine with culture. Patient aware.

## 2019-03-07 NOTE — Telephone Encounter (Signed)
Yes go ahead and put in a urine with culture

## 2019-03-07 NOTE — Telephone Encounter (Signed)
Does the provider want patient to leave a urine?

## 2019-03-08 LAB — URINALYSIS, COMPLETE
Bilirubin, UA: NEGATIVE
Leukocytes,UA: NEGATIVE
Nitrite, UA: NEGATIVE
RBC, UA: NEGATIVE
Specific Gravity, UA: >=1.03 — AB (ref 1.005–1.030)
Urobilinogen, Ur: 1 mg/dL (ref 0.2–1.0)
pH, UA: 5.5 (ref 5.0–7.5)

## 2019-03-08 LAB — MICROSCOPIC EXAMINATION
Bacteria, UA: NONE SEEN
Casts: NONE SEEN /lpf

## 2019-03-09 ENCOUNTER — Other Ambulatory Visit: Payer: Self-pay

## 2019-03-09 ENCOUNTER — Emergency Department (HOSPITAL_COMMUNITY): Payer: No Typology Code available for payment source

## 2019-03-09 ENCOUNTER — Observation Stay (HOSPITAL_COMMUNITY)
Admission: EM | Admit: 2019-03-09 | Discharge: 2019-03-11 | Disposition: A | Discharge status: Home / Self Care | Payer: No Typology Code available for payment source | Attending: Family Medicine | Admitting: Family Medicine

## 2019-03-09 DIAGNOSIS — R945 Abnormal results of liver function studies: Principal | ICD-10-CM | POA: Insufficient documentation

## 2019-03-09 DIAGNOSIS — D61818 Other pancytopenia: Secondary | ICD-10-CM | POA: Diagnosis not present

## 2019-03-09 DIAGNOSIS — J449 Chronic obstructive pulmonary disease, unspecified: Secondary | ICD-10-CM | POA: Diagnosis not present

## 2019-03-09 DIAGNOSIS — Z20828 Contact with and (suspected) exposure to other viral communicable diseases: Secondary | ICD-10-CM | POA: Diagnosis not present

## 2019-03-09 DIAGNOSIS — Z79899 Other long term (current) drug therapy: Secondary | ICD-10-CM | POA: Diagnosis not present

## 2019-03-09 DIAGNOSIS — R509 Fever, unspecified: Secondary | ICD-10-CM | POA: Diagnosis not present

## 2019-03-09 DIAGNOSIS — E119 Type 2 diabetes mellitus without complications: Secondary | ICD-10-CM | POA: Diagnosis not present

## 2019-03-09 DIAGNOSIS — Z85118 Personal history of other malignant neoplasm of bronchus and lung: Secondary | ICD-10-CM | POA: Insufficient documentation

## 2019-03-09 DIAGNOSIS — I5022 Chronic systolic (congestive) heart failure: Secondary | ICD-10-CM | POA: Insufficient documentation

## 2019-03-09 DIAGNOSIS — Z03818 Encounter for observation for suspected exposure to other biological agents ruled out: Secondary | ICD-10-CM | POA: Diagnosis not present

## 2019-03-09 DIAGNOSIS — Z87891 Personal history of nicotine dependence: Secondary | ICD-10-CM | POA: Insufficient documentation

## 2019-03-09 LAB — COMPREHENSIVE METABOLIC PANEL
ALT: 47 U/L — ABNORMAL HIGH (ref 0–44)
AST: 34 U/L (ref 15–41)
Albumin: 2.8 g/dL — ABNORMAL LOW (ref 3.5–5.0)
Alkaline Phosphatase: 88 U/L (ref 38–126)
Anion gap: 7 (ref 5–15)
BUN: 20 mg/dL (ref 8–23)
CO2: 26 mmol/L (ref 22–32)
Calcium: 8.5 mg/dL — ABNORMAL LOW (ref 8.9–10.3)
Chloride: 100 mmol/L (ref 98–111)
Creatinine, Ser: 1.16 mg/dL (ref 0.61–1.24)
GFR calc Af Amer: >60 mL/min (ref >60)
GFR calc non Af Amer: >60 mL/min (ref >60)
Glucose, Bld: 168 mg/dL — ABNORMAL HIGH (ref 70–99)
Potassium: 4.2 mmol/L (ref 3.5–5.1)
Sodium: 133 mmol/L — ABNORMAL LOW (ref 135–145)
Total Bilirubin: 0.7 mg/dL (ref 0.3–1.2)
Total Protein: 6.3 g/dL — ABNORMAL LOW (ref 6.5–8.1)

## 2019-03-09 LAB — CBC WITH DIFFERENTIAL/PLATELET
Abs Immature Granulocytes: 0.11 10*3/uL — ABNORMAL HIGH (ref 0.00–0.07)
Basophils Absolute: 0 10*3/uL (ref 0.0–0.1)
Basophils Relative: 0 %
Eosinophils Absolute: 0 10*3/uL (ref 0.0–0.5)
Eosinophils Relative: 0 %
HCT: 37.2 % — ABNORMAL LOW (ref 39.0–52.0)
Hemoglobin: 11.8 g/dL — ABNORMAL LOW (ref 13.0–17.0)
Immature Granulocytes: 5 %
Lymphocytes Relative: 19 %
Lymphs Abs: 0.5 10*3/uL — ABNORMAL LOW (ref 0.7–4.0)
MCH: 27.2 pg (ref 26.0–34.0)
MCHC: 31.7 g/dL (ref 30.0–36.0)
MCV: 85.7 fL (ref 80.0–100.0)
Monocytes Absolute: 0.3 10*3/uL (ref 0.1–1.0)
Monocytes Relative: 13 %
Neutro Abs: 1.5 10*3/uL — ABNORMAL LOW (ref 1.7–7.7)
Neutrophils Relative %: 63 %
Platelets: 61 10*3/uL — ABNORMAL LOW (ref 150–400)
RBC: 4.34 MIL/uL (ref 4.22–5.81)
RDW: 16.8 % — ABNORMAL HIGH (ref 11.5–15.5)
WBC: 2.4 10*3/uL — ABNORMAL LOW (ref 4.0–10.5)
nRBC: 0 % (ref 0.0–0.2)

## 2019-03-09 LAB — CBG MONITORING, ED: Glucose-Capillary: 237 mg/dL — ABNORMAL HIGH (ref 70–99)

## 2019-03-09 LAB — URINE CULTURE: Organism ID, Bacteria: NO GROWTH

## 2019-03-09 LAB — LIPASE, BLOOD: Lipase: 114 U/L — ABNORMAL HIGH (ref 11–51)

## 2019-03-09 NOTE — ED Notes (Signed)
Pt given water 

## 2019-03-09 NOTE — ED Triage Notes (Signed)
Pt states he has had a fever on and off for the last week. Highest was 104. Has been tested for COVID twice.  Last was Wednesday- resulted negative on Friday.  Also states that he is having issues with his blood sugar , his last fsbs was 497

## 2019-03-09 NOTE — ED Provider Notes (Signed)
[facility] EMERGENCY DEPARTMENT Provider Note   CSN: [id number] Arrival date & time: 03/09/19  2115     History   Chief Complaint Chief Complaint  Patient presents with  . Hyperglycemia  . Fever    HPI Timothy Guerrero is a 78 y.o. male.     The history is provided by the patient. No language interpreter was used.  Fever Max temp prior to arrival:  102 Temp source:  Oral Severity:  Moderate Onset quality:  Gradual Duration:  2 weeks Timing:  Intermittent Progression:  Waxing and waning Chronicity:  New Relieved by:  Acetaminophen Ineffective treatments:  None tried Associated symptoms: cough   Risk factors: hx of cancer   Pt reports he has had 2 negative covid test.  Pt states his glucose has been high and he has been running fevers on and off.  Pt and wife are concerned that he has an infection somewhere.  Pt reports he has a history of lung cancer.   Pt reports he recently had a chest xray at the [facility] hospital that was normal   Past Medical History:  Diagnosis Date  . Anemia   . Arthritis   . Atrial flutter ([place])    post op following lobectomy  . Benign essential tremor   . Blood transfusion without reported diagnosis   . Cataract   . CHF (congestive heart failure) ([place])   . Colon polyp    2007, polyp with early cancer, one year f/u no residual polyp. next TCS 2010, see PSH. [facility].  [name] COPD (chronic obstructive pulmonary disease) ([place])   . Diabetes mellitus without complication ([place])    10 years  . GERD (gastroesophageal reflux disease)   . HOH (hard of hearing)   . Hypercholesteremia   . Hypertension    10 years  . Lung cancer ([place])   . Prostate cancer ([place])   . Shortness of breath dyspnea    due to lung mass  . Skin melanoma ([place])   . Sleep apnea    wears CPAP sometimes, cannot tolerate all the time.    Patient Active Problem List   Diagnosis Date Noted  . Depression, recurrent ([place]) 04/03/2018  . Localized, primary  osteoarthritis of shoulder region 07/12/2017  . Blood loss anemia 11/07/2016  . Hypothyroidism 11/07/2016  . Chronic systolic HF (heart failure) ([place]) 10/18/2016  . Ischemic cardiomyopathy 10/18/2016  . COPD, moderate ([place]) 06/13/2016  . Restrictive lung disease 06/13/2016  . HOH (hard of hearing)   . Lung cancer ([place])   . Benign essential tremor   . Sleep apnea   . Arthritis   . Syncope 12/02/2015  . Herpes labialis 06/01/2015  . Squamous cell lung cancer ([place]) 03/09/2015  . Atrial flutter ([place])   . S/P lobectomy of lung 12/17/2014  . Prostate cancer ([place]) 12/08/2014  . Diabetes mellitus ([place]) 12/08/2014  . Essential hypertension 12/08/2014  . Melanoma of skin, site unspecified 12/31/2013  . History of colonic polyps 12/08/2013    Past Surgical History:  Procedure Laterality Date  . bottom lobe of right lung removed  12/17/14  . CARDIAC CATHETERIZATION     no PCI  . CATARACT EXTRACTION Left   . CATARACT EXTRACTION W/PHACO Left 01/03/2016   Procedure: CATARACT EXTRACTION PHACO AND INTRAOCULAR LENS PLACEMENT LEFT EYE CDE=7.78;  Surgeon: Williams Che, MD;  Location: [facility];  Service: Ophthalmology;  Laterality: Left;  . CATARACT EXTRACTION W/PHACO Right 03/20/2016   Procedure: CATARACT  EXTRACTION PHACO AND INTRAOCULAR LENS PLACEMENT; CDE:  9.58;  Surgeon: Williams Che, MD;  Location: [facility];  Service: Ophthalmology;  Laterality: Right;  . COLONOSCOPY  08/2008   Dr. Leafy Half, [place]: normal, internal grade 1 hemorrhoids. next TCS 08/2013  . COLONOSCOPY N/A 03/04/2014   Procedure: COLONOSCOPY;  Surgeon: Daneil Dolin, MD;  Location: [facility];  Service: Endoscopy;  Laterality: N/A;  7:30am  . EYE SURGERY    . knee replacements     bilateral. over 10 years ago  . LOBECTOMY Right 12/17/2014   Procedure: RIGHT LOWER LOBECTOMY;  Surgeon: Melrose Nakayama, MD;  Location: [place];  Service: Thoracic;  Laterality: Right;  . LYMPH NODE DISSECTION Right  12/17/2014   Procedure: LYMPH NODE DISSECTION;  Surgeon: Melrose Nakayama, MD;  Location: [place];  Service: Thoracic;  Laterality: Right;  . NECK SURGERY     melanoma removed.  [name] PROSTATE SURGERY     prostatectomy, radical  . TOTAL HIP ARTHROPLASTY  2007   left  . total shoulder replacement  2011   left  . VIDEO ASSISTED THORACOSCOPY (VATS)/WEDGE RESECTION Right 12/17/2014   Procedure: RIGHT VIDEO ASSISTED THORACOSCOPY (VATS);  Surgeon: Melrose Nakayama, MD;  Location: [place];  Service: Thoracic;  Laterality: Right;  [name] VIDEO BRONCHOSCOPY Bilateral 11/17/2016   Procedure: VIDEO BRONCHOSCOPY WITHOUT FLUORO;  Surgeon: Javier Glazier, MD;  Location: [facility];  Service: Cardiopulmonary;  Laterality: Bilateral;  . VIDEO BRONCHOSCOPY N/A 11/01/2017   Procedure: VIDEO BRONCHOSCOPY;  Surgeon: Melrose Nakayama, MD;  Location: [facility] OR;  Service: Thoracic;  Laterality: N/A;        Home Medications    Prior to Admission medications   Medication Sig Start Date End Date Taking? Authorizing Provider  amLODipine (NORVASC) 10 MG tablet Take 1 tablet (10 mg total) by mouth daily. 04/29/18  Yes Dettinger, Fransisca Kaufmann, MD  fluticasone furoate-vilanterol (BREO ELLIPTA) 200-25 MCG/INH AEPB Inhale 1 puff into the lungs daily. 05/27/18  Yes Hawks, Christy A, FNP  furosemide (LASIX) 20 MG tablet Take 1 tablet (20 mg total) by mouth daily. 01/23/19 01/18/20 Yes Dettinger, Fransisca Kaufmann, MD  gabapentin (NEURONTIN) 300 MG capsule Take 300 mg by mouth 3 (three) times daily.   Yes [provider]  insulin aspart protamine- aspart (NOVOLOG MIX 70/30) (70-30) 100 UNIT/ML injection Inject 35-40 Units into the skin.   Yes [provider]  levofloxacin (LEVAQUIN) 500 MG tablet Take 1 tablet (500 mg total) by mouth daily. For 10 days 03/06/19  Yes Stacks, Cletus Gash, MD  levothyroxine (SYNTHROID, LEVOTHROID) 50 MCG tablet Take 1 tablet (50 mcg total) by mouth daily before breakfast. ((for thyroid)) 08/19/18   Yes Dettinger, Fransisca Kaufmann, MD  LORazepam (ATIVAN) 1 MG tablet Take 1 tablet (1 mg total) by mouth at bedtime as needed for anxiety. For anxiety, Take 1 hour prior up coming head scan. 08/26/18  Yes Dettinger, Fransisca Kaufmann, MD  magnesium oxide (MAG-OX) 400 MG tablet Take 400 mg by mouth 2 (two) times daily.   Yes [provider]  meloxicam (MOBIC) 15 MG tablet Take 15 mg by mouth daily.   Yes [provider]  metFORMIN (GLUCOPHAGE) 1000 MG tablet Take 1 tablet (1,000 mg total) by mouth 2 (two) times daily with a meal. 04/03/18  Yes Dettinger, Fransisca Kaufmann, MD  olmesartan (BENICAR) 5 MG tablet Take 1 tablet (5 mg total) by mouth daily. 08/26/18  Yes Dettinger, Fransisca Kaufmann, MD  pantoprazole (  PROTONIX) 40 MG tablet Take 1 tablet (40 mg total) by mouth daily. 08/26/18  Yes Dettinger, Fransisca Kaufmann, MD  Potassium Chloride ER 20 MEQ TBCR Take 20 mEq by mouth daily. Take only on days when taking lasix 10/18/18 03/09/19 Yes Dettinger, Fransisca Kaufmann, MD  rosuvastatin (CRESTOR) 20 MG tablet Take 20 mg by mouth daily.   Yes [provider]  Semaglutide (OZEMPIC) 0.25 or 0.5 MG/DOSE SOPN Inject 0.5 mg into the skin once a week. Patient taking differently: Inject 1 mg into the skin once a week.  01/18/18  Yes Hawks, Christy A, FNP  sertraline (ZOLOFT) 50 MG tablet Take 1 tablet (50 mg total) by mouth daily. 08/26/18  Yes Dettinger, Fransisca Kaufmann, MD  albuterol (PROVENTIL HFA;VENTOLIN HFA) 108 (90 Base) MCG/ACT inhaler Inhale 1 puff into the lungs every 6 (six) hours as needed for wheezing or shortness of breath.    [provider]  benzonatate (TESSALON) 100 MG capsule Take 1 capsule (100 mg total) by mouth 2 (two) times daily as needed for cough. 10/18/18   Dettinger, Fransisca Kaufmann, MD  losartan (COZAAR) 50 MG tablet Take 25 mg by mouth daily. 01/03/19   [provider]  metoprolol (TOPROL-XL) 200 MG 24 hr tablet Take 1 tablet (200 mg total) by mouth daily. 08/26/18   Dettinger, Fransisca Kaufmann, MD  Tiotropium  Bromide-Olodaterol (STIOLTO RESPIMAT IN) Inhale 1 puff into the lungs daily as needed (shortness of breath).    [provider]  triamcinolone cream (KENALOG) 0.1 % Apply 1 application topically 2 (two) times daily. 01/19/18   [name], MD  valACYclovir (VALTREX) 1000 MG tablet Take 1,000 mg by mouth daily as needed (cold sores).     [provider]    Family History Family History  Problem Relation Age of Onset  . Cancer Mother        unknown type  . COPD Sister   . Breast cancer Sister   . Cancer Sister 64       breast  . Prostate cancer Son        died age 102  . Heart attack Daughter   . Prostate cancer Son   . Colon cancer Neg Hx     Social History Social History   Tobacco Use  . Smoking status: Former Smoker    Packs/day: 2.00    Years: 32.00    Pack years: 64.00    Types: Cigarettes    Quit date: 07/03/1986    Years since quitting: 32.7  . Smokeless tobacco: Never Used  Substance Use Topics  . Alcohol use: Yes    Alcohol/week: 0.0 standard drinks    Comment: rarely wine  . Drug use: No     Allergies   Patient has no known allergies.   Review of Systems Review of Systems  Constitutional: Positive for fever.  Respiratory: Positive for cough.   All other systems reviewed and are negative.    Physical Exam Updated Vital Signs BP 139/74 (BP Location: Right Arm)   Pulse (!) 114   Temp 98.6 F (37 C) (Oral)   Resp 18   Ht 6' (1.829 m)   Wt 85.3 kg   SpO2 96%   BMI 25.50 kg/m   Physical Exam Vitals signs and nursing note reviewed.  Constitutional:      Appearance: He is well-developed.  HENT:     Head: Normocephalic and atraumatic.     Right Ear: Tympanic membrane normal.     Left  Ear: Tympanic membrane normal.     Nose: Nose normal.     Mouth/Throat:     Mouth: Mucous membranes are moist.  Eyes:     Conjunctiva/sclera: Conjunctivae normal.  Neck:     Musculoskeletal: Neck supple.  Cardiovascular:     Rate and  Rhythm: Normal rate and regular rhythm.     Heart sounds: No murmur.  Pulmonary:     Effort: Pulmonary effort is normal. No respiratory distress.     Breath sounds: Normal breath sounds.  Abdominal:     Palpations: Abdomen is soft.     Tenderness: There is no abdominal tenderness.  Musculoskeletal: Normal range of motion.  Skin:    General: Skin is warm and dry.  Neurological:     General: No focal deficit present.     Mental Status: He is alert.  Psychiatric:        Mood and Affect: Mood normal.      ED Treatments / Results  Labs (all labs ordered are listed, but only abnormal results are displayed) Labs Reviewed  CBC WITH DIFFERENTIAL/PLATELET - Abnormal; Notable for the following components:      Result Value   WBC 2.4 (*)    Hemoglobin 11.8 (*)    HCT 37.2 (*)    RDW 16.8 (*)    Platelets 61 (*)    Neutro Abs 1.5 (*)    Lymphs Abs 0.5 (*)    Abs Immature Granulocytes 0.11 (*)    All other components within normal limits  CBG MONITORING, ED - Abnormal; Notable for the following components:   Glucose-Capillary 237 (*)    All other components within normal limits  COMPREHENSIVE METABOLIC PANEL  LIPASE, BLOOD  URINALYSIS, ROUTINE W REFLEX MICROSCOPIC    EKG None  Radiology Dg Chest Port 1 View  Result Date: 03/09/2019 CLINICAL DATA:  Fever EXAM: PORTABLE CHEST 1 VIEW COMPARISON:  06/24/2018 FINDINGS: Heart and mediastinal contours are within normal limits. No focal opacities or effusions. No acute bony abnormality. IMPRESSION: No active disease. Electronically Signed   By: Rolm Baptise M.D.   On: 03/09/2019 23:10    Procedures Procedures (including critical care time)  Medications Ordered in ED Medications - No data to display   Initial Impression / Assessment and Plan / ED Course  I have reviewed the triage vital signs and the nursing notes.  Pertinent labs & imaging results that were available during my care of the patient were reviewed by me and  considered in my medical decision making (see chart for details).        MDM   Final Clinical Impressions(s) / ED Diagnoses   Final diagnoses:  None    ED Discharge Orders    None       Fransico Meadow, [place] 03/10/19 [place], MD 03/13/19 [phone]

## 2019-03-10 ENCOUNTER — Observation Stay (HOSPITAL_COMMUNITY): Payer: No Typology Code available for payment source

## 2019-03-10 ENCOUNTER — Encounter (HOSPITAL_COMMUNITY): Payer: Self-pay

## 2019-03-10 ENCOUNTER — Observation Stay (HOSPITAL_BASED_OUTPATIENT_CLINIC_OR_DEPARTMENT_OTHER): Payer: No Typology Code available for payment source

## 2019-03-10 DIAGNOSIS — R1110 Vomiting, unspecified: Secondary | ICD-10-CM | POA: Diagnosis not present

## 2019-03-10 DIAGNOSIS — D61818 Other pancytopenia: Secondary | ICD-10-CM

## 2019-03-10 DIAGNOSIS — D735 Infarction of spleen: Secondary | ICD-10-CM | POA: Diagnosis not present

## 2019-03-10 DIAGNOSIS — I714 Abdominal aortic aneurysm, without rupture: Secondary | ICD-10-CM | POA: Diagnosis not present

## 2019-03-10 DIAGNOSIS — R509 Fever, unspecified: Secondary | ICD-10-CM | POA: Diagnosis present

## 2019-03-10 LAB — COMPREHENSIVE METABOLIC PANEL
ALT: 46 U/L — ABNORMAL HIGH (ref 0–44)
AST: 36 U/L (ref 15–41)
Albumin: 2.8 g/dL — ABNORMAL LOW (ref 3.5–5.0)
Alkaline Phosphatase: 86 U/L (ref 38–126)
Anion gap: 6 (ref 5–15)
BUN: 19 mg/dL (ref 8–23)
CO2: 27 mmol/L (ref 22–32)
Calcium: 8.6 mg/dL — ABNORMAL LOW (ref 8.9–10.3)
Chloride: 102 mmol/L (ref 98–111)
Creatinine, Ser: 0.99 mg/dL (ref 0.61–1.24)
GFR calc Af Amer: >60 mL/min (ref >60)
GFR calc non Af Amer: >60 mL/min (ref >60)
Glucose, Bld: 159 mg/dL — ABNORMAL HIGH (ref 70–99)
Potassium: 4 mmol/L (ref 3.5–5.1)
Sodium: 135 mmol/L (ref 135–145)
Total Bilirubin: 0.4 mg/dL (ref 0.3–1.2)
Total Protein: 6.2 g/dL — ABNORMAL LOW (ref 6.5–8.1)

## 2019-03-10 LAB — CBC
HCT: 37 % — ABNORMAL LOW (ref 39.0–52.0)
Hemoglobin: 11.7 g/dL — ABNORMAL LOW (ref 13.0–17.0)
MCH: 27 pg (ref 26.0–34.0)
MCHC: 31.6 g/dL (ref 30.0–36.0)
MCV: 85.3 fL (ref 80.0–100.0)
Platelets: 62 10*3/uL — ABNORMAL LOW (ref 150–400)
RBC: 4.34 MIL/uL (ref 4.22–5.81)
RDW: 16.8 % — ABNORMAL HIGH (ref 11.5–15.5)
WBC: 2.1 10*3/uL — ABNORMAL LOW (ref 4.0–10.5)
nRBC: 0 % (ref 0.0–0.2)

## 2019-03-10 LAB — ECHOCARDIOGRAM COMPLETE
Height: 72 in
Weight: 3164.04 oz

## 2019-03-10 LAB — GLUCOSE, CAPILLARY
Glucose-Capillary: 170 mg/dL — ABNORMAL HIGH (ref 70–99)
Glucose-Capillary: 265 mg/dL — ABNORMAL HIGH (ref 70–99)
Glucose-Capillary: 277 mg/dL — ABNORMAL HIGH (ref 70–99)
Glucose-Capillary: 377 mg/dL — ABNORMAL HIGH (ref 70–99)

## 2019-03-10 LAB — URINALYSIS, ROUTINE W REFLEX MICROSCOPIC
Bacteria, UA: NONE SEEN
Bilirubin Urine: NEGATIVE
Glucose, UA: >=500 mg/dL — AB
Hgb urine dipstick: NEGATIVE
Ketones, ur: NEGATIVE mg/dL
Leukocytes,Ua: NEGATIVE
Nitrite: NEGATIVE
Protein, ur: 100 mg/dL — AB
Specific Gravity, Urine: 1.027 (ref 1.005–1.030)
pH: 5 (ref 5.0–8.0)

## 2019-03-10 LAB — PROTIME-INR
INR: 1.3 — ABNORMAL HIGH (ref 0.8–1.2)
Prothrombin Time: 15.7 seconds — ABNORMAL HIGH (ref 11.4–15.2)

## 2019-03-10 LAB — IRON AND TIBC
Iron: 28 ug/dL — ABNORMAL LOW (ref 45–182)
Saturation Ratios: 9 % — ABNORMAL LOW (ref 17.9–39.5)
TIBC: 317 ug/dL (ref 250–450)
UIBC: 289 ug/dL

## 2019-03-10 LAB — RETICULOCYTES
Immature Retic Fract: 14.8 % (ref 2.3–15.9)
RBC.: 4.35 MIL/uL (ref 4.22–5.81)
Retic Count, Absolute: 30.9 10*3/uL (ref 19.0–186.0)
Retic Ct Pct: 0.7 % (ref 0.4–3.1)

## 2019-03-10 LAB — SEDIMENTATION RATE: Sed Rate: 35 mm/hr — ABNORMAL HIGH (ref 0–16)

## 2019-03-10 LAB — APTT: aPTT: 30 seconds (ref 24–36)

## 2019-03-10 LAB — PSA: Prostatic Specific Antigen: 0.76 ng/mL (ref 0.00–4.00)

## 2019-03-10 LAB — LACTATE DEHYDROGENASE: LDH: 150 U/L (ref 98–192)

## 2019-03-10 LAB — VITAMIN B12: Vitamin B-12: 434 pg/mL (ref 180–914)

## 2019-03-10 LAB — C-REACTIVE PROTEIN: CRP: 8.2 mg/dL — ABNORMAL HIGH (ref <1.0)

## 2019-03-10 LAB — FERRITIN: Ferritin: 124 ng/mL (ref 24–336)

## 2019-03-10 MED ORDER — GLUCERNA SHAKE PO LIQD
237.0000 mL | Freq: Two times a day (BID) | ORAL | Status: DC
  Administered 2019-03-11 (×2): 237 mL via ORAL

## 2019-03-10 MED ORDER — POTASSIUM CHLORIDE CRYS ER 20 MEQ PO TBCR
20.0000 meq | EXTENDED_RELEASE_TABLET | Freq: Every day | ORAL | Status: DC
  Administered 2019-03-10 – 2019-03-11 (×2): 20 meq via ORAL
  Filled 2019-03-10 (×2): qty 1

## 2019-03-10 MED ORDER — ACETAMINOPHEN 650 MG RE SUPP: 650.0000 mg | Freq: Four times a day (QID) | RECTAL | Status: DC | PRN

## 2019-03-10 MED ORDER — ROSUVASTATIN CALCIUM 20 MG PO TABS
20.0000 mg | ORAL_TABLET | Freq: Every day | ORAL | Status: DC
  Administered 2019-03-10 – 2019-03-11 (×2): 20 mg via ORAL
  Filled 2019-03-10 (×2): qty 1

## 2019-03-10 MED ORDER — PRO-STAT SUGAR FREE PO LIQD
30.0000 mL | Freq: Two times a day (BID) | ORAL | Status: DC
  Administered 2019-03-10: 30 mL via ORAL
  Filled 2019-03-10 (×3): qty 30

## 2019-03-10 MED ORDER — TIOTROPIUM BROMIDE-OLODATEROL 2.5-2.5 MCG/ACT IN AERS: INHALATION_SPRAY | Freq: Every day | RESPIRATORY_TRACT | Status: DC | PRN

## 2019-03-10 MED ORDER — PANTOPRAZOLE SODIUM 40 MG PO TBEC
40.0000 mg | DELAYED_RELEASE_TABLET | Freq: Every day | ORAL | Status: DC
  Administered 2019-03-10 – 2019-03-11 (×2): 40 mg via ORAL
  Filled 2019-03-10 (×2): qty 1

## 2019-03-10 MED ORDER — LEVOTHYROXINE SODIUM 50 MCG PO TABS
50.0000 ug | ORAL_TABLET | Freq: Every day | ORAL | Status: DC
  Administered 2019-03-10 – 2019-03-11 (×2): 50 ug via ORAL
  Filled 2019-03-10 (×2): qty 1

## 2019-03-10 MED ORDER — MELOXICAM 7.5 MG PO TABS
15.0000 mg | ORAL_TABLET | Freq: Every day | ORAL | Status: DC
  Administered 2019-03-10: 15 mg via ORAL
  Filled 2019-03-10: qty 2

## 2019-03-10 MED ORDER — SERTRALINE HCL 50 MG PO TABS
50.0000 mg | ORAL_TABLET | Freq: Every day | ORAL | Status: DC
  Filled 2019-03-10: qty 1

## 2019-03-10 MED ORDER — ENSURE ENLIVE PO LIQD
237.0000 mL | Freq: Two times a day (BID) | ORAL | Status: DC
  Administered 2019-03-10 (×2): 237 mL via ORAL

## 2019-03-10 MED ORDER — ACETAMINOPHEN 325 MG PO TABS: 650.0000 mg | ORAL_TABLET | Freq: Four times a day (QID) | ORAL | Status: DC | PRN

## 2019-03-10 MED ORDER — SODIUM CHLORIDE 0.9% FLUSH
3.0000 mL | INTRAVENOUS | Status: DC | PRN
  Administered 2019-03-10: 3 mL via INTRAVENOUS
  Filled 2019-03-10: qty 3

## 2019-03-10 MED ORDER — LORAZEPAM 1 MG PO TABS: 1.0000 mg | ORAL_TABLET | Freq: Every evening | ORAL | Status: DC | PRN

## 2019-03-10 MED ORDER — METOPROLOL SUCCINATE ER 50 MG PO TB24
200.0000 mg | ORAL_TABLET | Freq: Every day | ORAL | Status: DC
  Filled 2019-03-10: qty 4

## 2019-03-10 MED ORDER — SODIUM CHLORIDE 0.9 % IV SOLN: 250.0000 mL | INTRAVENOUS | Status: DC | PRN

## 2019-03-10 MED ORDER — INSULIN ASPART PROT & ASPART (70-30 MIX) 100 UNIT/ML ~~LOC~~ SUSP
20.0000 [IU] | Freq: Two times a day (BID) | SUBCUTANEOUS | Status: DC
  Administered 2019-03-11: 20 [IU] via SUBCUTANEOUS
  Filled 2019-03-10: qty 10

## 2019-03-10 MED ORDER — AMLODIPINE BESYLATE 5 MG PO TABS
10.0000 mg | ORAL_TABLET | Freq: Every day | ORAL | Status: DC
  Administered 2019-03-10 – 2019-03-11 (×2): 10 mg via ORAL
  Filled 2019-03-10 (×2): qty 2

## 2019-03-10 MED ORDER — IOHEXOL 300 MG/ML  SOLN
100.0000 mL | Freq: Once | INTRAMUSCULAR | Status: AC | PRN
  Administered 2019-03-10: 100 mL via INTRAVENOUS

## 2019-03-10 MED ORDER — FUROSEMIDE 20 MG PO TABS
20.0000 mg | ORAL_TABLET | Freq: Every day | ORAL | Status: DC
  Administered 2019-03-10 – 2019-03-11 (×2): 20 mg via ORAL
  Filled 2019-03-10 (×2): qty 1

## 2019-03-10 MED ORDER — LOSARTAN POTASSIUM 50 MG PO TABS
25.0000 mg | ORAL_TABLET | Freq: Every day | ORAL | Status: DC
  Administered 2019-03-10 – 2019-03-11 (×2): 25 mg via ORAL
  Filled 2019-03-10 (×2): qty 1

## 2019-03-10 MED ORDER — INSULIN ASPART 100 UNIT/ML ~~LOC~~ SOLN
0.0000 [IU] | Freq: Every day | SUBCUTANEOUS | Status: DC
  Administered 2019-03-10: 5 [IU] via SUBCUTANEOUS

## 2019-03-10 MED ORDER — INSULIN ASPART 100 UNIT/ML ~~LOC~~ SOLN
0.0000 [IU] | Freq: Three times a day (TID) | SUBCUTANEOUS | Status: DC
  Administered 2019-03-10: 5 [IU] via SUBCUTANEOUS
  Administered 2019-03-10: 2 [IU] via SUBCUTANEOUS
  Administered 2019-03-10: 5 [IU] via SUBCUTANEOUS
  Administered 2019-03-11: 9 [IU] via SUBCUTANEOUS
  Administered 2019-03-11: 5 [IU] via SUBCUTANEOUS

## 2019-03-10 MED ORDER — SODIUM CHLORIDE 0.9% FLUSH
3.0000 mL | Freq: Two times a day (BID) | INTRAVENOUS | Status: DC
  Administered 2019-03-10 – 2019-03-11 (×2): 3 mL via INTRAVENOUS

## 2019-03-10 MED ORDER — GABAPENTIN 300 MG PO CAPS
300.0000 mg | ORAL_CAPSULE | Freq: Three times a day (TID) | ORAL | Status: DC
  Filled 2019-03-10 (×2): qty 1

## 2019-03-10 MED ORDER — ALBUTEROL SULFATE (2.5 MG/3ML) 0.083% IN NEBU: 2.5000 mg | INHALATION_SOLUTION | Freq: Four times a day (QID) | RESPIRATORY_TRACT | Status: DC | PRN

## 2019-03-10 MED ORDER — FLUTICASONE FUROATE-VILANTEROL 200-25 MCG/INH IN AEPB
1.0000 | INHALATION_SPRAY | Freq: Every day | RESPIRATORY_TRACT | Status: DC
  Administered 2019-03-10 – 2019-03-11 (×2): 1 via RESPIRATORY_TRACT
  Filled 2019-03-10: qty 28

## 2019-03-10 NOTE — Progress Notes (Signed)
Initial Nutrition Assessment  DOCUMENTATION CODES:      INTERVENTION:  Ensure Enlive po BID, each supplement provides 350 kcal and 20 grams of protein   ProStat 30 ml BID (each 30 ml provides 100 kcal, 15 gr protein)    NUTRITION DIAGNOSIS:   Increased nutrient needs related to acute illness(fever of unknown etiology) as evidenced by per patient/family report.    GOAL:   Patient will meet greater than or equal to 90% of their needs  MONITOR:   PO intake, Supplement acceptance, Labs, Weight trends  REASON FOR ASSESSMENT:   Malnutrition Screening Tool    ASSESSMENT:  Patient is a 78 yo male with hx of DM2, HTN, Prostate and lung cancer COPD, HF, Anemia, GERD. He presents with c/o fever off and on for the past month. Currently afebrile. Meal intake: 75%- feeds himself. Home diet is regular. Medications reviewed and include: lasix, SSI, Protonix, K-dur, Crestor.  Patient body weight has been around 90 kg for the past ~10 months.  Labs: BMP Latest Ref Rng & Units 03/10/2019 03/09/2019 11/20/2018  Glucose 70 - 99 mg/dL 159(H) 168(H) -  BUN 8 - 23 mg/dL 19 20 -  Creatinine 0.61 - 1.24 mg/dL 0.99 1.16 0.90  BUN/Creat Ratio 10 - 24 - - -  Sodium 135 - 145 mmol/L 135 133(L) -  Potassium 3.5 - 5.1 mmol/L 4.0 4.2 -  Chloride 98 - 111 mmol/L 102 100 -  CO2 22 - 32 mmol/L 27 26 -  Calcium 8.9 - 10.3 mg/dL 8.6(L) 8.5(L) -     NUTRITION - FOCUSED PHYSICAL EXAM:  Unable to complete Nutrition-Focused physical exam at this time.     Diet Order:   Diet Order            Diet heart healthy/carb modified Room service appropriate? Yes; Fluid consistency: Thin  Diet effective now              EDUCATION NEEDS:   No education needs have been identified at this time  Skin:  Skin Assessment: Reviewed RN Assessment  Last BM:  9/7  Height:   Ht Readings from Last 1 Encounters:  03/10/19 6' (1.829 m)    Weight:   Wt Readings from Last 1 Encounters:  03/10/19 89.7 kg     Ideal Body Weight:  81 kg  BMI:  Body mass index is 26.82 kg/m.  Estimated Nutritional Needs:   Kcal:  6073-7106 (MSJ x 1.2-1.3 AF)  Protein:  105-113 gr (1.3-1.4 gr/kg/ibw)  Fluid:  < 2.0 liters daily   Colman Cater MS,RD,CSG,LDN Office: [phone] Pager: [phone]

## 2019-03-10 NOTE — Progress Notes (Addendum)
Patient seen and evaluated, chart reviewed, please see EMR for updated orders. Please see full H&P dictated by admitting physician Dr Maudie Mercury for same date of service.   wife at bedside   A/p Pancytopenia--- work-up in progress, Dr. Delton Coombes to see -Blood cultures pending -Avoid NSAIDs   HTN--stable, continue amlodipine 10 mg daily,and losartan  Hypothyroidism--- stable, continue levothyroxine 50 mcg daily  DM-- insulin as ordered

## 2019-03-10 NOTE — H&P (Signed)
TRH H&P    Patient Demographics:    Timothy Guerrero, is a 78 y.o. male  MRN: [id number]  DOB - 19-Apr-1941  Admit Date - 03/09/2019  Referring MD/NP/PA: Kathrynn Humble  Outpatient Primary MD for the patient is Dettinger, Fransisca Kaufmann, MD  Patient coming from:   home  Chief complaint- fever for 4 weeks   HPI:    Timothy Guerrero  is a 79 y.o. male, with hypertension, hyperlipidemia, Dm2,  paroxysmal atrial fibrillation, CHF, COPD, OSA, remote history of prostate cancer, recent diagnosis of squamous cell lung cancer stage Ia status post XRT, anemia, presents with complaints of fever intermittently for the past 4 weeks as high as 104.  Patient is not certain if there is any pattern to the fever.  Patient denies any rash, tick exposure,.  Patient notes some diarrhea over the last couple of days.  But notes that his stool is loose and has only had about 2 bowel movements per day.  Patient denies any cough,  chest pain,  palpitations, abdominal pain, nausea, vomiting, bright red blood per rectum.  Patient denies any dysuria hematuria   Patient does note that he had dental work about 5 weeks ago where he had some caps placed.  Patient denies any travel other than to the beach.  He has tested negative x2 at the [place] [facility]. Pt has recently been on levaquin. He thinks this might have made his fever better.   In ED, Temperature 98.6 pulse 114 respiratory rate 18 blood pressure 139/74 pulse ox 96% on room air Weight 85.3 kg  CXR IMPRESSION: No active disease.  Urinalysis WBC 0-5, RBC 0-5 Blood culture x2 pending  WBC 2.4, hemoglobin 11.8, platelet count 61 Sodium 133, potassium 4.2 BUN 20, creatinine 1.16 alb  2.8 AST 34, ALT 47, alkaline phosphatase 88, total bilirubin 0.7 Lipase 114  LDH 150 retic  30.9  Patient will be admitted for fever of unknown origin, pancytopenia.    Review of systems:    In addition to  the HPI above,    No Headache, No changes with Vision or hearing, No problems swallowing food or Liquids, No Chest pain, Cough or Shortness of Breath, No Abdominal pain, No Nausea or Vomiting,  No Blood in stool or Urine, No dysuria, No new skin rashes or bruises, No new joints pains-aches,  No new weakness, tingling, numbness in any extremity, No recent weight gain or loss, No polyuria, polydypsia or polyphagia, No significant Mental Stressors.  All other systems reviewed and are negative.    Past History of the following :    Past Medical History:  Diagnosis Date   Anemia    Arthritis    Atrial flutter ([place])    post op following lobectomy   Benign essential tremor    Blood transfusion without reported diagnosis    Cataract    CHF (congestive heart failure) ([place])    Colon polyp    2007, polyp with early cancer, one year f/u no residual polyp. next TCS 2010, see [facility]. [facility].   COPD (chronic obstructive pulmonary disease) (HCC)    Diabetes mellitus without complication (HCC)    10 years   GERD (gastroesophageal reflux disease)    HOH (hard of hearing)    Hypercholesteremia    Hypertension    10 years   Lung cancer ([place])    Prostate cancer (HCC)    Shortness of breath dyspnea    due to lung mass   Skin melanoma ([place])    Sleep apnea    wears CPAP sometimes, cannot tolerate all the time.      Past Surgical History:  Procedure Laterality Date   bottom lobe of right lung removed  12/17/14   CARDIAC CATHETERIZATION     no PCI   CATARACT EXTRACTION Left    CATARACT EXTRACTION W/PHACO Left 01/03/2016   Procedure: CATARACT EXTRACTION PHACO AND INTRAOCULAR LENS PLACEMENT LEFT EYE CDE=7.78;  Surgeon: Williams Che, MD;  Location: [facility];  Service: Ophthalmology;  Laterality: Left;   CATARACT EXTRACTION W/PHACO Right 03/20/2016   Procedure: CATARACT EXTRACTION PHACO AND INTRAOCULAR LENS PLACEMENT; CDE:  9.58;  Surgeon: Williams Che, MD;  Location: [facility];  Service: Ophthalmology;  Laterality: Right;   COLONOSCOPY  08/2008   Dr. Leafy Half, [place]: normal, internal grade 1 hemorrhoids. next TCS 08/2013   COLONOSCOPY N/A 03/04/2014   Procedure: COLONOSCOPY;  Surgeon: Daneil Dolin, MD;  [facility];  Service: Endoscopy;  Laterality: N/A;  7:30am   EYE SURGERY     knee replacements     bilateral. over 10 years ago   LOBECTOMY Right 12/17/2014   Procedure: RIGHT LOWER LOBECTOMY;  Surgeon: Melrose Nakayama, MD;  Location: [place] [facility];  Service: Thoracic;  Laterality: Right;   LYMPH NODE DISSECTION Right 12/17/2014   Procedure: LYMPH NODE DISSECTION;  Surgeon: Melrose Nakayama, MD;  Location: [facility];  Service: Thoracic;  Laterality: Right;   NECK SURGERY     melanoma removed.   PROSTATE SURGERY     prostatectomy, radical   TOTAL HIP ARTHROPLASTY  2007   left   total shoulder replacement  2011   left   VIDEO ASSISTED THORACOSCOPY (VATS)/WEDGE RESECTION Right 12/17/2014   Procedure: RIGHT VIDEO ASSISTED THORACOSCOPY (VATS);  Surgeon: Melrose Nakayama, MD;  Location: [facility];  Service: Thoracic;  Laterality: Right;   VIDEO BRONCHOSCOPY Bilateral 11/17/2016   Procedure: VIDEO BRONCHOSCOPY WITHOUT FLUORO;  Surgeon: Javier Glazier, MD;  Location: [facility];  Service: Cardiopulmonary;  Laterality: Bilateral;   VIDEO BRONCHOSCOPY N/A 11/01/2017   Procedure: VIDEO BRONCHOSCOPY;  Surgeon: Melrose Nakayama, MD;  Location: [facility];  Service: Thoracic;  Laterality: N/A;      Social History:      Social History   Tobacco Use   Smoking status: Former Smoker    Packs/day: 2.00    Years: 32.00    Pack years: 64.00    Types: Cigarettes    Quit date: 07/03/1986    Years since quitting: 32.7   Smokeless tobacco: Never Used  Substance Use Topics   Alcohol use: Yes    Alcohol/week: 0.0 standard drinks    Comment: rarely wine       Family History :     Family  History  Problem Relation Age of Onset   Cancer Mother        unknown type   COPD Sister    Breast cancer Sister    Cancer Sister 11  breast   Prostate cancer Son        died age 13   Heart attack Daughter    Prostate cancer Son    Colon cancer Neg Hx        Home Medications:   Prior to Admission medications   Medication Sig Start Date End Date Taking? Authorizing Provider  amLODipine (NORVASC) 10 MG tablet Take 1 tablet (10 mg total) by mouth daily. 04/29/18  Yes Dettinger, Fransisca Kaufmann, MD  fluticasone furoate-vilanterol (BREO ELLIPTA) 200-25 MCG/INH AEPB Inhale 1 puff into the lungs daily. 05/27/18  Yes Hawks, Christy A, FNP  furosemide (LASIX) 20 MG tablet Take 1 tablet (20 mg total) by mouth daily. 01/23/19 01/18/20 Yes Dettinger, Fransisca Kaufmann, MD  gabapentin (NEURONTIN) 300 MG capsule Take 300 mg by mouth 3 (three) times daily.   Yes [provider]  insulin aspart protamine- aspart (NOVOLOG MIX 70/30) (70-30) 100 UNIT/ML injection Inject 35-40 Units into the skin.   Yes [provider]  levofloxacin (LEVAQUIN) 500 MG tablet Take 1 tablet (500 mg total) by mouth daily. For 10 days 03/06/19  Yes Stacks, Cletus Gash, MD  levothyroxine (SYNTHROID, LEVOTHROID) 50 MCG tablet Take 1 tablet (50 mcg total) by mouth daily before breakfast. ((for thyroid)) 08/19/18  Yes Dettinger, Fransisca Kaufmann, MD  LORazepam (ATIVAN) 1 MG tablet Take 1 tablet (1 mg total) by mouth at bedtime as needed for anxiety. For anxiety, Take 1 hour prior up coming head scan. 08/26/18  Yes Dettinger, Fransisca Kaufmann, MD  magnesium oxide (MAG-OX) 400 MG tablet Take 400 mg by mouth 2 (two) times daily.   Yes [provider]  meloxicam (MOBIC) 15 MG tablet Take 15 mg by mouth daily.   Yes [provider]  metFORMIN (GLUCOPHAGE) 1000 MG tablet Take 1 tablet (1,000 mg total) by mouth 2 (two) times daily with a meal. 04/03/18  Yes Dettinger, Fransisca Kaufmann, MD  olmesartan (BENICAR) 5 MG tablet Take 1 tablet (5  mg total) by mouth daily. 08/26/18  Yes Dettinger, Fransisca Kaufmann, MD  pantoprazole (PROTONIX) 40 MG tablet Take 1 tablet (40 mg total) by mouth daily. 08/26/18  Yes Dettinger, Fransisca Kaufmann, MD  Potassium Chloride ER 20 MEQ TBCR Take 20 mEq by mouth daily. Take only on days when taking lasix 10/18/18 03/09/19 Yes Dettinger, Fransisca Kaufmann, MD  rosuvastatin (CRESTOR) 20 MG tablet Take 20 mg by mouth daily.   Yes [provider]  Semaglutide (OZEMPIC) 0.25 or 0.5 MG/DOSE SOPN Inject 0.5 mg into the skin once a week. Patient taking differently: Inject 1 mg into the skin once a week.  01/18/18  Yes Hawks, Christy A, FNP  sertraline (ZOLOFT) 50 MG tablet Take 1 tablet (50 mg total) by mouth daily. 08/26/18  Yes Dettinger, Fransisca Kaufmann, MD  albuterol (PROVENTIL HFA;VENTOLIN HFA) 108 (90 Base) MCG/ACT inhaler Inhale 1 puff into the lungs every 6 (six) hours as needed for wheezing or shortness of breath.    [provider]  benzonatate (TESSALON) 100 MG capsule Take 1 capsule (100 mg total) by mouth 2 (two) times daily as needed for cough. 10/18/18   Dettinger, Fransisca Kaufmann, MD  losartan (COZAAR) 50 MG tablet Take 25 mg by mouth daily. 01/03/19   [provider]  metoprolol (TOPROL-XL) 200 MG 24 hr tablet Take 1 tablet (200 mg total) by mouth daily. 08/26/18   Dettinger, Fransisca Kaufmann, MD  Tiotropium Bromide-Olodaterol (STIOLTO RESPIMAT IN) Inhale 1 puff into the lungs daily as needed (shortness of breath).  [provider]  triamcinolone cream (KENALOG) 0.1 % Apply 1 application topically 2 (two) times daily. 01/19/18   [name], MD  valACYclovir (VALTREX) 1000 MG tablet Take 1,000 mg by mouth daily as needed (cold sores).     [provider]     Allergies:    No Known Allergies   Physical Exam:   Vitals  Blood pressure 135/74, pulse 88, temperature 98.6 F (37 C), temperature source Oral, resp. rate 18, height 6' (1.829 m), weight 85.3 kg, SpO2 95 %.  1.  General: Alert and  oriented x3  2. Psychiatric: Euthymic  3. Neurologic: Nonfocal  4. HEENMT:  Anicteric, no conjunctival hemorrhage, pupils 1.5 mm, symmetric, direct, consensual, near intact Neck no JVD  5. Respiratory : CTAB  6. Cardiovascular : Regular rate rhythm S1-S2 no murmurs gallops or rubs  7. Gastrointestinal:  Abdomen: Soft nontender nondistended positive bowel sounds  8. Skin:  Extremities: No cyanosis clubbing or edema No synovitis, splinter hemorrhages on finger on the right hand, no [place], no [place] 9.Musculoskeletal:  Good range of motion    Data Review:    CBC Recent Labs  Lab 03/09/19 2318  WBC 2.4*  HGB 11.8*  HCT 37.2*  PLT 61*  MCV 85.7  MCH 27.2  MCHC 31.7  RDW 16.8*  LYMPHSABS 0.5*  MONOABS 0.3  EOSABS 0.0  BASOSABS 0.0   ------------------------------------------------------------------------------------------------------------------  Results for orders placed or performed during the hospital encounter of 03/09/19 (from the past 48 hour(s))  CBG monitoring, ED     Status: Abnormal   Collection Time: 03/09/19  9:38 PM  Result Value Ref Range   Glucose-Capillary 237 (H) 70 - 99 mg/dL  CBC with Differential/Platelet     Status: Abnormal   Collection Time: 03/09/19 11:18 PM  Result Value Ref Range   WBC 2.4 (L) 4.0 - 10.5 K/uL   RBC 4.34 4.22 - 5.81 MIL/uL   Hemoglobin 11.8 (L) 13.0 - 17.0 g/dL   HCT 37.2 (L) 39.0 - 52.0 %   MCV 85.7 80.0 - 100.0 fL   MCH 27.2 26.0 - 34.0 pg   MCHC 31.7 30.0 - 36.0 g/dL   RDW 16.8 (H) 11.5 - 15.5 %   Platelets 61 (L) 150 - 400 K/uL    Comment: SPECIMEN CHECKED FOR CLOTS   nRBC 0.0 0.0 - 0.2 %   Neutrophils Relative % 63 %   Neutro Abs 1.5 (L) 1.7 - 7.7 K/uL   Lymphocytes Relative 19 %   Lymphs Abs 0.5 (L) 0.7 - 4.0 K/uL   Monocytes Relative 13 %   Monocytes Absolute 0.3 0.1 - 1.0 K/uL   Eosinophils Relative 0 %   Eosinophils Absolute 0.0 0.0 - 0.5 K/uL   Basophils Relative 0 %   Basophils Absolute 0.0  0.0 - 0.1 K/uL   Immature Granulocytes 5 %   Abs Immature Granulocytes 0.11 (H) 0.00 - 0.07 K/uL    Comment: Performed at [facility], [address], [place], [place] [address]  Comprehensive metabolic panel     Status: Abnormal   Collection Time: 03/09/19 11:18 PM  Result Value Ref Range   Sodium 133 (L) 135 - 145 mmol/L   Potassium 4.2 3.5 - 5.1 mmol/L   Chloride 100 98 - 111 mmol/L   CO2 26 22 - 32 mmol/L   Glucose, Bld 168 (H) 70 - 99 mg/dL   BUN 20 8 - 23 mg/dL   Creatinine, Ser 1.16 0.61 - 1.24 mg/dL  Calcium 8.5 (L) 8.9 - 10.3 mg/dL   Total Protein 6.3 (L) 6.5 - 8.1 g/dL   Albumin 2.8 (L) 3.5 - 5.0 g/dL   AST 34 15 - 41 U/L   ALT 47 (H) 0 - 44 U/L   Alkaline Phosphatase 88 38 - 126 U/L   Total Bilirubin 0.7 0.3 - 1.2 mg/dL   GFR calc non Af Amer >60 >60 mL/min   GFR calc Af Amer >60 >60 mL/min   Anion gap 7 5 - 15    Comment: Performed at [facility], [address], [place], [place] [address]  Lipase, blood     Status: Abnormal   Collection Time: 03/09/19 11:18 PM  Result Value Ref Range   Lipase 114 (H) 11 - 51 U/L    Comment: Performed at [facility], [address], [place], [place] [address]  Reticulocytes     Status: None   Collection Time: 03/09/19 11:18 PM  Result Value Ref Range   Retic Ct Pct 0.7 0.4 - 3.1 %   RBC. 4.35 4.22 - 5.81 MIL/uL   Retic Count, Absolute 30.9 19.0 - 186.0 K/uL   Immature Retic Fract 14.8 2.3 - 15.9 %    Comment: Performed at [facility], [address], [place], [place] [address]  Lactate dehydrogenase     Status: None   Collection Time: 03/09/19 11:18 PM  Result Value Ref Range   LDH 150 98 - 192 U/L    Comment: Performed at [facility], [address], [place], [place] [address]  Urinalysis, Routine w reflex microscopic     Status: Abnormal   Collection Time: 03/10/19 12:30 AM  Result Value Ref Range   Color, Urine YELLOW YELLOW   APPearance CLEAR CLEAR   Specific Gravity, Urine 1.027 1.005 - 1.030   pH 5.0 5.0 - 8.0    Glucose, UA >=500 (A) NEGATIVE mg/dL   Hgb urine dipstick NEGATIVE NEGATIVE   Bilirubin Urine NEGATIVE NEGATIVE   Ketones, ur NEGATIVE NEGATIVE mg/dL   Protein, ur 100 (A) NEGATIVE mg/dL   Nitrite NEGATIVE NEGATIVE   Leukocytes,Ua NEGATIVE NEGATIVE   RBC / HPF 0-5 0 - 5 RBC/hpf   WBC, UA 0-5 0 - 5 WBC/hpf   Bacteria, UA NONE SEEN NONE SEEN   Mucus PRESENT     Comment: Performed at [facility], [address], [place], [place] [address]  Blood culture (routine x 2)     Status: None (Preliminary result)   Collection Time: 03/10/19 12:52 AM   Specimen: Right Antecubital; Blood  Result Value Ref Range   Specimen Description RIGHT ANTECUBITAL    Special Requests      BOTTLES DRAWN AEROBIC AND ANAEROBIC Blood Culture adequate volume Performed at [facility], [address] [place], [place] [address]    Culture PENDING    Report Status PENDING   Blood culture (routine x 2)     Status: None (Preliminary result)   Collection Time: 03/10/19 12:56 AM   Specimen: Left Antecubital; Blood  Result Value Ref Range   Specimen Description LEFT ANTECUBITAL    Special Requests      BOTTLES DRAWN AEROBIC AND ANAEROBIC Blood Culture adequate volume Performed at [facility], [address], [place],  [address]    Culture PENDING    Report Status PENDING     Chemistries  Recent Labs  Lab 03/09/19 2318  NA 133*  K 4.2  CL 100  CO2 26  GLUCOSE 168*  BUN 20  CREATININE 1.16  CALCIUM 8.5*  AST 34  ALT 47*  ALKPHOS 88  BILITOT 0.7   ------------------------------------------------------------------------------------------------------------------  ------------------------------------------------------------------------------------------------------------------ GFR: Estimated Creatinine Clearance: 58.5 mL/min (by C-G formula based on SCr of 1.16 mg/dL). Liver Function Tests: Recent Labs  Lab 03/09/19 2318  AST 34  ALT 47*  ALKPHOS 88  BILITOT 0.7  PROT 6.3*  ALBUMIN 2.8*     Recent Labs  Lab 03/09/19 2318  LIPASE 114*   No results for input(s): AMMONIA in the last 168 hours. Coagulation Profile: No results for input(s): INR, PROTIME in the last 168 hours. Cardiac Enzymes: No results for input(s): CKTOTAL, CKMB, CKMBINDEX, TROPONINI in the last 168 hours. BNP (last 3 results) No results for input(s): PROBNP in the last 8760 hours. HbA1C: No results for input(s): HGBA1C in the last 72 hours. CBG: Recent Labs  Lab 03/09/19 2138  GLUCAP 237*   Lipid Profile: No results for input(s): CHOL, HDL, LDLCALC, TRIG, CHOLHDL, LDLDIRECT in the last 72 hours. Thyroid Function Tests: No results for input(s): TSH, T4TOTAL, FREET4, T3FREE, THYROIDAB in the last 72 hours. Anemia Panel: Recent Labs    03/09/19 2318  RETICCTPCT 0.7    --------------------------------------------------------------------------------------------------------------- Urine analysis:    Component Value Date/Time   COLORURINE YELLOW 03/10/2019 0030   APPEARANCEUR CLEAR 03/10/2019 0030   APPEARANCEUR Clear 03/07/2019 1643   LABSPEC 1.027 03/10/2019 0030   PHURINE 5.0 03/10/2019 0030   GLUCOSEU >=500 (A) 03/10/2019 0030   HGBUR NEGATIVE 03/10/2019 0030   BILIRUBINUR NEGATIVE 03/10/2019 0030   BILIRUBINUR Negative 03/07/2019 1643   KETONESUR NEGATIVE 03/10/2019 0030   PROTEINUR 100 (A) 03/10/2019 0030   UROBILINOGEN 0.2 12/15/2014 1111   NITRITE NEGATIVE 03/10/2019 0030   LEUKOCYTESUR NEGATIVE 03/10/2019 0030      Imaging Results:    Dg Chest Port 1 View  Result Date: 03/09/2019 CLINICAL DATA:  Fever EXAM: PORTABLE CHEST 1 VIEW COMPARISON:  06/24/2018 FINDINGS: Heart and mediastinal contours are within normal limits. No focal opacities or effusions. No acute bony abnormality. IMPRESSION: No active disease. Electronically Signed   By: Rolm Baptise M.D.   On: 03/09/2019 23:10      Assessment & Plan:    Active Problems:   Pancytopenia (HCC)   Pancytopenia Check esr,  ana, parvovirus B19, hiv, check lyme titer, check ehrlichiosis titer, spep, upep, psa,  ED consulted heme-onc, appreciate input (ED states discussed case with [facility])  Fever unclear source Blood culture x2 Check CT chest/ abd/ pelvis Check cardiac echo  Anemia Check b12, folate, ferritin, iron, tibc  Abnormal liver function Check acute hepatitis panel CT scan as above  Severe protein calorie malnutrition Start pro-stat 75m po bid  Elevated lipase  CT scan as above  Hypertension Cont Norvasc '10mg'$  po qday Cont Losartan '25mg'$  po qday Cont Toprol XL '200mg'$  po qday Cont Lasix '20mg'$  po qday  Hyperlipidemia Cont Crestor '20mg'$  po qhs  Dm2 Stop metformin Stop Ozempic fsbs ac and qhs, ISS  Diabetes with neuropathy  Cont Gabapentin  Hypothyroidism Cont Levothyroxine 50 micrograms po qday  Copd  Cont Stiolto Cont Breo  Gerd Cont PPI  Anxiety Cont zoloft '50mg'$  po qday Cont Ativan   DVT Prophylaxis-   SCDs   AM Labs Ordered, also please review Full Orders  Family Communication: Admission, patients condition and plan of care including tests being ordered have been discussed with the patient  who indicate understanding and agree with the plan and Code Status.  Code Status:  FULL CODE per patient, left  message for daughter that pt being admitted for fever, pancytopenia to [facility]  Admission status: Observation: Based on patients clinical presentation and evaluation of above clinical data, I have made determination that patient meets observation  criteria at this time.   Time spent in minutes : 70   Jani Gravel M.D on 03/10/2019 at 4:23 AM

## 2019-03-10 NOTE — Progress Notes (Signed)
  Echocardiogram 2D Echocardiogram has been performed.  Timothy Guerrero 03/10/2019, 8:42 AM

## 2019-03-10 NOTE — Care Management Obs Status (Signed)
[place] NOTIFICATION   Patient Details  Name: Timothy Guerrero MRN: [id number] Date of Birth: [birth year]   Medicare Observation Status Notification Given:  Other (see comment)(obs letter was placed in the patient's room due to precautions)    Tommy Medal 03/10/2019, 2:42 PM

## 2019-03-11 DIAGNOSIS — D61818 Other pancytopenia: Secondary | ICD-10-CM | POA: Diagnosis not present

## 2019-03-11 LAB — COMPREHENSIVE METABOLIC PANEL
ALT: 40 U/L (ref 0–44)
AST: 27 U/L (ref 15–41)
Albumin: 2.4 g/dL — ABNORMAL LOW (ref 3.5–5.0)
Alkaline Phosphatase: 75 U/L (ref 38–126)
Anion gap: 5 (ref 5–15)
BUN: 18 mg/dL (ref 8–23)
CO2: 29 mmol/L (ref 22–32)
Calcium: 8.3 mg/dL — ABNORMAL LOW (ref 8.9–10.3)
Chloride: 101 mmol/L (ref 98–111)
Creatinine, Ser: 0.94 mg/dL (ref 0.61–1.24)
GFR calc Af Amer: >60 mL/min (ref >60)
GFR calc non Af Amer: >60 mL/min (ref >60)
Glucose, Bld: 268 mg/dL — ABNORMAL HIGH (ref 70–99)
Potassium: 4.2 mmol/L (ref 3.5–5.1)
Sodium: 135 mmol/L (ref 135–145)
Total Bilirubin: 0.9 mg/dL (ref 0.3–1.2)
Total Protein: 5.8 g/dL — ABNORMAL LOW (ref 6.5–8.1)

## 2019-03-11 LAB — CBC WITH DIFFERENTIAL/PLATELET
Abs Immature Granulocytes: 0.03 10*3/uL (ref 0.00–0.07)
Basophils Absolute: 0 10*3/uL (ref 0.0–0.1)
Basophils Relative: 0 %
Eosinophils Absolute: 0 10*3/uL (ref 0.0–0.5)
Eosinophils Relative: 0 %
HCT: 32.2 % — ABNORMAL LOW (ref 39.0–52.0)
Hemoglobin: 10.5 g/dL — ABNORMAL LOW (ref 13.0–17.0)
Immature Granulocytes: 1 %
Lymphocytes Relative: 26 %
Lymphs Abs: 0.6 10*3/uL — ABNORMAL LOW (ref 0.7–4.0)
MCH: 27.2 pg (ref 26.0–34.0)
MCHC: 32.6 g/dL (ref 30.0–36.0)
MCV: 83.4 fL (ref 80.0–100.0)
Monocytes Absolute: 0.3 10*3/uL (ref 0.1–1.0)
Monocytes Relative: 15 %
Neutro Abs: 1.4 10*3/uL — ABNORMAL LOW (ref 1.7–7.7)
Neutrophils Relative %: 58 %
Platelets: 73 10*3/uL — ABNORMAL LOW (ref 150–400)
RBC: 3.86 MIL/uL — ABNORMAL LOW (ref 4.22–5.81)
RDW: 16.6 % — ABNORMAL HIGH (ref 11.5–15.5)
WBC: 2.4 10*3/uL — ABNORMAL LOW (ref 4.0–10.5)
nRBC: 0 % (ref 0.0–0.2)

## 2019-03-11 LAB — URINE CULTURE: Culture: NO GROWTH

## 2019-03-11 LAB — EHRLICHIA ANTIBODY PANEL
E chaffeensis (HGE) Ab, IgG: NEGATIVE
E chaffeensis (HGE) Ab, IgM: NEGATIVE
E. Chaffeensis (HME) IgM Titer: NEGATIVE
E.Chaffeensis (HME) IgG: NEGATIVE

## 2019-03-11 LAB — HEPATITIS PANEL, ACUTE
HCV Ab: 0.2 s/co ratio (ref 0.0–0.9)
Hep A IgM: NEGATIVE
Hep B C IgM: NEGATIVE
Hepatitis B Surface Ag: NEGATIVE

## 2019-03-11 LAB — ANA: Anti Nuclear Antibody (ANA): NEGATIVE

## 2019-03-11 LAB — GLUCOSE, CAPILLARY
Glucose-Capillary: 271 mg/dL — ABNORMAL HIGH (ref 70–99)
Glucose-Capillary: 404 mg/dL — ABNORMAL HIGH (ref 70–99)

## 2019-03-11 LAB — B. BURGDORFI ANTIBODIES: B burgdorferi Ab IgG+IgM: <0.91 {ISR} (ref 0.00–0.90)

## 2019-03-11 LAB — GLUCOSE, RANDOM: Glucose, Bld: 493 mg/dL — ABNORMAL HIGH (ref 70–99)

## 2019-03-11 LAB — HIV ANTIBODY (ROUTINE TESTING W REFLEX): HIV Screen 4th Generation wRfx: NONREACTIVE

## 2019-03-11 LAB — SARS CORONAVIRUS 2 (TAT 6-24 HRS): SARS Coronavirus 2: NEGATIVE

## 2019-03-11 LAB — PARVOVIRUS B19 ANTIBODY, IGG AND IGM
Parovirus B19 IgG Abs: 4.2 index — ABNORMAL HIGH (ref 0.0–0.8)
Parovirus B19 IgM Abs: 0.2 index (ref 0.0–0.8)

## 2019-03-11 LAB — FOLATE: Folate: 15.3 ng/mL (ref >5.9)

## 2019-03-11 LAB — LACTATE DEHYDROGENASE: LDH: 148 U/L (ref 98–192)

## 2019-03-11 MED ORDER — ACETAMINOPHEN 325 MG PO TABS: 650.0000 mg | ORAL_TABLET | Freq: Four times a day (QID) | ORAL | 1 refills | Status: AC | PRN

## 2019-03-11 NOTE — Progress Notes (Signed)
PT DISCHARGED HOME, IV REMOVED, ANGIO INTACT, VS STABLE, DENIES C/O PAIN, PT VERBALIZED UNDERSTANDING OF ALL INSTRUCTIONS SUPPLIED. PT LEFT FLOOR VIA WHEELCHAIR WITH BELONGINGS ACCOMPANIED BY NURSING STAFF & SPOUSE.

## 2019-03-11 NOTE — Progress Notes (Signed)
Inpatient Diabetes Program Recommendations  AACE/ADA: New Consensus Statement on Inpatient Glycemic Control   Target Ranges:  Prepandial:   less than 140 mg/dL      Peak postprandial:   less than 180 mg/dL (1-2 hours)      Critically ill patients:  140 - 180 mg/dL   Results for Timothy Guerrero, Timothy Guerrero (MRN [id number]) as of 03/11/2019 13:48  Ref. Range 03/10/2019 07:29 03/10/2019 11:11 03/10/2019 16:21 03/10/2019 21:35 03/11/2019 08:11 03/11/2019 10:55  Glucose-Capillary Latest Ref Range: 70 - 99 mg/dL 170 (H) 265 (H) 277 (H) 377 (H) 271 (H) 404 (H)   Review of Glycemic Control  Diabetes history: DM2 Outpatient Diabetes medications: 70/30 30 units BID, Metformin 1000 mg BID, Ozempic 1 mg Qweek Current orders for Inpatient glycemic control: 70/30 20 units BID, Novolog 0-9 units TID with meals, Novolog 0-5 units QHS  Inpatient Diabetes Program Recommendations:   HbgA1C: Please consider ordering an A1C to evaluate glycemic control over the past 2-3 months.  NOTE: Noted 70/30 35-40 units BID listed on home medication list without any frequency.  Spoke with patient over the phone to clarify outpatient DM medications. Patient states that he follows instructions from his [place] doctor for DM medications. Patient reports that he is taking 70/30 30 units BID, Metformin 1000 mg BID, and Ozempic 1 mg Q week for DM as an outpatient. Patient reports that he has been on Ozempic for over 1 year and tolerates it without any issues. Patient reports that he has had several doctors in the past try to change his insulin dosages and his DM control is worse so he follows instructions from his [place]. Encouraged patient to check glucose 2-3 times per day and to follow up with [facility] doctor regarding DM control. Patient verbalized understanding of information discussed and he states he has no questions at this time.  Thanks, Barnie Alderman, RN, MSN, CDE Diabetes Coordinator Inpatient Diabetes Program [phone] (Team Pager from 8am to  5pm)

## 2019-03-11 NOTE — Discharge Instructions (Signed)
1)Stop Meloxican----- Please Avoid ibuprofen/Advil/Aleve/Motrin/Goody Powders/Naproxen/BC powders/Meloxicam/Diclofenac/Indomethacin and other Nonsteroidal anti-inflammatory medications as these will make you more likely to bleed and can cause stomach ulcers, can also cause Kidney problems.  2)Please call if fevers over 100.4 3) please follow-up with Dr. Delton Coombes the hematologist next week to review the results of your blood work and to discuss further investigations for your low blood count--- Address: inside [facility], [place], [place], [place] [address] Phone: [phone]

## 2019-03-11 NOTE — Discharge Summary (Signed)
Timothy Guerrero, is a 78 y.o. male  DOB 03-04-1941  MRN [id number].  Admission date:  03/09/2019  Admitting Physician  Jani Gravel, MD  Discharge Date:  03/11/2019   Primary MD  Dettinger, Fransisca Kaufmann, MD  Recommendations for primary care physician for things to follow:    1)Stop Meloxican----- Please Avoid ibuprofen/Advil/Aleve/Motrin/Goody Powders/Naproxen/BC powders/Meloxicam/Diclofenac/Indomethacin and other Nonsteroidal anti-inflammatory medications as these will make you more likely to bleed and can cause stomach ulcers, can also cause Kidney problems.  2)Please call if fevers over 100.4 3) please follow-up with Dr. Delton Coombes the hematologist next week to review the results of your blood work and to discuss further investigations for your low blood count--- Address: inside [facility], [place], [place], [place] [address] Phone: [phone]  Admission Diagnosis  Abnormal liver function [R94.5] Pancytopenia [facility]) [D61.818]   Discharge Diagnosis  Abnormal liver function [R94.5] Pancytopenia ([place]) [D61.818]    Principal Problem:   Pancytopenia ([place]) Active Problems:   Fever     Past Medical History:  Diagnosis Date   Anemia    Arthritis    Atrial flutter ([place])    post op following lobectomy   Benign essential tremor    Blood transfusion without reported diagnosis    Cataract    CHF (congestive heart failure) ([place])    Colon polyp    2007, polyp with early cancer, one year f/u no residual polyp. next TCS 2010, see PSH. [facility].   COPD (chronic obstructive pulmonary disease) (HCC)    Diabetes mellitus without complication (HCC)    10 years   GERD (gastroesophageal reflux disease)    HOH (hard of hearing)    Hypercholesteremia    Hypertension    10 years   Lung cancer ([place])    Prostate cancer (HCC)    Shortness of breath dyspnea    due to lung  mass   Skin melanoma ([place])    Sleep apnea    wears CPAP sometimes, cannot tolerate all the time.    Past Surgical History:  Procedure Laterality Date   bottom lobe of right lung removed  12/17/14   CARDIAC CATHETERIZATION     no PCI   CATARACT EXTRACTION Left    CATARACT EXTRACTION W/PHACO Left 01/03/2016   Procedure: CATARACT EXTRACTION PHACO AND INTRAOCULAR LENS PLACEMENT LEFT EYE CDE=7.78;  Surgeon: Williams Che, MD;  Location: [facility];  Service: Ophthalmology;  Laterality: Left;   CATARACT EXTRACTION W/PHACO Right 03/20/2016   Procedure: CATARACT EXTRACTION PHACO AND INTRAOCULAR LENS PLACEMENT; CDE:  9.58;  Surgeon: Williams Che, MD;  Location: [facility];  Service: Ophthalmology;  Laterality: Right;   COLONOSCOPY  08/2008   Dr. Leafy Half, [place]: normal, internal grade 1 hemorrhoids. next TCS 08/2013   COLONOSCOPY N/A 03/04/2014   Procedure: COLONOSCOPY;  Surgeon: Daneil Dolin, MD;  Location: [facility];  Service: Endoscopy;  Laterality: N/A;  7:30am   EYE SURGERY     knee  replacements     bilateral. over 10 years ago   LOBECTOMY Right 12/17/2014   Procedure: RIGHT LOWER LOBECTOMY;  Surgeon: Melrose Nakayama, MD;  Location: [place];  Service: Thoracic;  Laterality: Right;   LYMPH NODE DISSECTION Right 12/17/2014   Procedure: LYMPH NODE DISSECTION;  Surgeon: Melrose Nakayama, MD;  Location: [place];  Service: Thoracic;  Laterality: Right;   NECK SURGERY     melanoma removed.   PROSTATE SURGERY     prostatectomy, radical   TOTAL HIP ARTHROPLASTY  2007   left   total shoulder replacement  2011   left   VIDEO ASSISTED THORACOSCOPY (VATS)/WEDGE RESECTION Right 12/17/2014   Procedure: RIGHT VIDEO ASSISTED THORACOSCOPY (VATS);  Surgeon: Melrose Nakayama, MD;  Location: [place];  Service: Thoracic;  Laterality: Right;   VIDEO BRONCHOSCOPY Bilateral 11/17/2016   Procedure: VIDEO BRONCHOSCOPY WITHOUT FLUORO;  Surgeon: Javier Glazier,  MD;  Location: [facility];  Service: Cardiopulmonary;  Laterality: Bilateral;   VIDEO BRONCHOSCOPY N/A 11/01/2017   Procedure: VIDEO BRONCHOSCOPY;  Surgeon: Melrose Nakayama, MD;  Location: [facility] OR;  Service: Thoracic;  Laterality: N/A;       HPI  from the history and physical done on the day of admission:     Timothy Guerrero  is a 78 y.o. male, with hypertension, hyperlipidemia, Dm2,  paroxysmal atrial fibrillation, CHF, COPD, OSA, remote history of prostate cancer, recent diagnosis of squamous cell lung cancer stage Ia status post XRT, anemia, presents with complaints of fever intermittently for the past 4 weeks as high as 104.  Patient is not certain if there is any pattern to the fever.  Patient denies any rash, tick exposure,.  Patient notes some diarrhea over the last couple of days.  But notes that his stool is loose and has only had about 2 bowel movements per day.  Patient denies any cough,  chest pain,  palpitations, abdominal pain, nausea, vomiting, bright red blood per rectum.  Patient denies any dysuria hematuria   Patient does note that he had dental work about 5 weeks ago where he had some caps placed.  Patient denies any travel other than to the beach.  He has tested negative x2 at the [place] for [place]. Pt has recently been on levaquin. He thinks this might have made his fever better.   In ED, Temperature 98.6 pulse 114 respiratory rate 18 blood pressure 139/74 pulse ox 96% on room air Weight 85.3 kg  CXR IMPRESSION: No active disease.  Urinalysis WBC 0-5, RBC 0-5 Blood culture x2 pending  WBC 2.4, hemoglobin 11.8, platelet count 61 Sodium 133, potassium 4.2 BUN 20, creatinine 1.16 alb  2.8 AST 34, ALT 47, alkaline phosphatase 88, total bilirubin 0.7 Lipase 114  LDH 150 retic  30.9  Patient will be admitted for fever of unknown origin, pancytopenia.    Hospital Course:     Pancytopenia  esr-35, LDH not elevated , 50s with AST of 27 ALT of 40 and alk phos  of 75 with a T bili of 0.9, INR 1.3, creatinine 0.9  -ANA neg, HIV and parvovirus B19-pending, ferritin 124, serum iron low at 28 with iron saturation of 9, HDL is normal at 434, folate pending, CRP is elevated at 8.2, PSA normal, RMSF titers pending -Hepatitis panel negative, B. burgdorferi antibodies negative, Ehrlichia antibody panel negative, protein electrophoresis pending  spep, upep-pending, copper and  methylmalonic acid pending -Discussed with Dr. Delton Coombes oncologist, official oncology note pending recommended lab work  obtained -CT abdomen and pelvis as well as CT chest without infectious type findings -Blood and urine cultures NGTD -Patient will follow up with Dr. Delton Coombes next week in the clinic to review available labs and to make a decision on possible bone marrow biopsy  Fever unclear source -PTA patient has taken Levaquin for 3 days, -No further fevers -Blood and urine cultures NGTD  -CT chest/ abd/ pelvis--without infectious type findings -cardiac echo without intracardiac source of infection, EF is preserved at 60 to 65%  Elevated lipase  -Abdominal exam is benign, tolerating oral intake well, CT abdomen and pelvis without acute findings specifically no evidence of acute pancreatitis  Hypertension Stable, cont Norvasc 53m po qday Cont Losartan 241mpo qday Cont Toprol XL 20024mo qday Cont Lasix 37m79m qday  Hyperlipidemia Cont Crestor 37mg72mqhs  Dm2 Stable, resume home regimen including metformin,  Ozempic  Hypothyroidism Cont Levothyroxine 50 micrograms po qday  Copd  Cont Stiolto Cont Breo  Gerd Cont PPI  Anxiety Cont zoloft 50mg 46mday Cont Ativan  Discharge Condition: stable  Follow UP----Dr. KatragDelton Coombesweek to discuss lab results and consider possible bone marrow biopsy as part of further evaluation for his pancytopenia     Consults obtained -discussed with hematologist Dr. KatragDelton Coombes and Activity recommendation:   As advised  Discharge Instructions    Discharge Instructions    Call MD for:  difficulty breathing, headache or visual disturbances   Complete by: As directed    Call MD for:  persistant dizziness or light-headedness   Complete by: As directed    Call MD for:  persistant nausea and vomiting   Complete by: As directed    Call MD for:  severe uncontrolled pain   Complete by: As directed    Call MD for:  temperature >100.4   Complete by: As directed    Diet - low sodium heart healthy   Complete by: As directed    Discharge instructions   Complete by: As directed    1)Stop Meloxican----- Please Avoid ibuprofen/Advil/Aleve/Motrin/Goody Powders/Naproxen/BC powders/Meloxicam/Diclofenac/Indomethacin and other Nonsteroidal anti-inflammatory medications as these will make you more likely to bleed and can cause stomach ulcers, can also cause Kidney problems.  2)Please call if fevers over 100.4 3) please follow-up with Dr. KatragDelton Coombesematologist next week to review the results of your blood work and to discuss further investigations for your low blood count--- [facility] [id number]: [phone] activity slowly   Complete by: As directed         Discharge Medications     Allergies as of 03/11/2019   No Known Allergies     Medication List    STOP taking these medications   levofloxacin 500 MG tablet Commonly known as: LEVAQUIN   meloxicam 15 MG tablet Commonly known as: MOBIC   olmesartan 5 MG tablet Commonly known as: BENICAR     TAKE these medications   acetaminophen 325 MG tablet Commonly known as: TYLENOL Take 2 tablets (650 mg total) by mouth every 6 (six) hours as needed for mild pain, fever or headache (or Fever >/= 101).   albuterol 108 (90 Base) MCG/ACT inhaler Commonly known as: VENTOLIN HFA Inhale 1 puff into the lungs every 6 (six) hours as needed for wheezing or shortness of breath.     amLODipine 10 MG tablet Commonly known as: NORVASC Take 1 tablet (10 mg total) by mouth  daily.   benzonatate 100 MG capsule Commonly known as: TESSALON Take 1 capsule (100 mg total) by mouth 2 (two) times daily as needed for cough.   fluticasone furoate-vilanterol 200-25 MCG/INH Aepb Commonly known as: Breo Ellipta Inhale 1 puff into the lungs daily.   furosemide 20 MG tablet Commonly known as: LASIX Take 1 tablet (20 mg total) by mouth daily.   gabapentin 300 MG capsule Commonly known as: NEURONTIN Take 300 mg by mouth 3 (three) times daily.   insulin aspart protamine- aspart (70-30) 100 UNIT/ML injection Commonly known as: NOVOLOG MIX 70/30 Inject 35-40 Units into the skin.   levothyroxine 50 MCG tablet Commonly known as: SYNTHROID Take 1 tablet (50 mcg total) by mouth daily before breakfast. ((for thyroid))   LORazepam 1 MG tablet Commonly known as: ATIVAN Take 1 tablet (1 mg total) by mouth at bedtime as needed for anxiety. For anxiety, Take 1 hour prior up coming head scan.   losartan 50 MG tablet Commonly known as: COZAAR Take 25 mg by mouth daily.   magnesium oxide 400 MG tablet Commonly known as: MAG-OX Take 400 mg by mouth 2 (two) times daily.   metFORMIN 1000 MG tablet Commonly known as: GLUCOPHAGE Take 1 tablet (1,000 mg total) by mouth 2 (two) times daily with a meal.   metoprolol 200 MG 24 hr tablet Commonly known as: TOPROL-XL Take 1 tablet (200 mg total) by mouth daily.   pantoprazole 40 MG tablet Commonly known as: PROTONIX Take 1 tablet (40 mg total) by mouth daily.   Potassium Chloride ER 20 MEQ Tbcr Take 20 mEq by mouth daily. Take only on days when taking lasix   rosuvastatin 20 MG tablet Commonly known as: CRESTOR Take 20 mg by mouth daily.   Semaglutide(0.25 or 0.5MG/DOS) 2 MG/1.5ML Sopn Commonly known as: Ozempic (0.25 or 0.5 MG/DOSE) Inject 0.5 mg into the skin once a week. What changed: how much to take   sertraline 50 MG  tablet Commonly known as: Zoloft Take 1 tablet (50 mg total) by mouth daily.   STIOLTO RESPIMAT IN Inhale 1 puff into the lungs daily as needed (shortness of breath).   triamcinolone cream 0.1 % Commonly known as: KENALOG Apply 1 application topically 2 (two) times daily.   Valtrex 1000 MG tablet Generic drug: valACYclovir Take 1,000 mg by mouth daily as needed (cold sores).            Durable Medical Equipment  (From admission, onward)         Start     Ordered   03/11/19 1127  DME Glucometer  Once     03/11/19 1130          Major procedures and Radiology Reports - PLEASE review detailed and final reports for all details, in brief -    Ct Chest W Contrast  Result Date: 03/10/2019 CLINICAL DATA:  Fever of unknown origin for 1 week. Nausea and vomiting. Pancytopenia. Personal history of lung carcinoma. EXAM: CT CHEST, ABDOMEN, AND PELVIS WITH CONTRAST TECHNIQUE: Multidetector CT imaging of the chest, abdomen and pelvis was performed following the standard protocol during bolus administration of intravenous contrast. CONTRAST:  159m OMNIPAQUE IOHEXOL 300 MG/ML  SOLN COMPARISON:  Chest CT on 11/20/2018 and PET-CT on 11/14/2017 FINDINGS: CT CHEST FINDINGS Cardiovascular: No acute findings. Aortic and coronary artery atherosclerosis. Mediastinum/Lymph Nodes: No masses or pathologically enlarged lymph nodes identified. Lungs/Pleura: Stable postop changes from right lower lobectomy. Mild centrilobular emphysema noted. A 6 mm pulmonary nodule in  the lingula on image 100/3 remains stable, consistent with benign etiology. No new or enlarging pulmonary nodules or masses identified. No evidence of pulmonary infiltrate or pleural effusion. Mild bilateral pleural calcification is stable and consistent with asbestos related pleural disease. Musculoskeletal:  No suspicious bone lesions identified. CT ABDOMEN AND PELVIS FINDINGS Hepatobiliary: No masses identified. Pancreas:  No mass or  inflammatory changes. Spleen: No evidence of splenomegaly. 2 small peripheral wedge-shaped low-attenuation lesions are seen in the spleen which were not definitely visualized on previous studies and are consistent with small splenic infarcts. Adrenals/Urinary tract: Stable 1.6 cm homogeneous right adrenal mass, consistent with benign adenoma. A few right renal cysts are also stable. No evidence of renal masses or hydronephrosis. Stomach/Bowel: Mild colonic diverticulosis is noted, however there is no evidence of diverticulitis. Normal appendix visualized. No evidence of bowel obstruction, inflammatory process, or abnormal fluid collections. Vascular/Lymphatic: No pathologically enlarged lymph nodes identified. Stable 3.0 cm infrarenal abdominal aortic aneurysm. Reproductive: Clips seen from probable prior prostatectomy. Limited visualization due to extensive beam hardening artifact from left hip prosthesis. Other:  None. Musculoskeletal:  No suspicious bone lesions identified. IMPRESSION: 1. Stable postop changes from right lower lobectomy. No evidence of recurrent or metastatic carcinoma within the chest, abdomen, or pelvis. 2. Two small splenic infarcts, not definitely visualized on previous studies. 3. Stable 3.0 cm infrarenal abdominal aortic aneurysm. Recommend followup by ultrasound in 3 years. This recommendation follows ACR consensus guidelines: White Paper of the ACR Incidental Findings Committee II on Vascular Findings. J Am Coll Radiol 2013; 12:458-099 4. Colonic diverticulosis. No radiographic evidence of diverticulitis. Electronically Signed   By: Marlaine Hind M.D.   On: 03/10/2019 07:16   Ct Abdomen Pelvis W Contrast  Result Date: 03/10/2019 CLINICAL DATA:  Fever of unknown origin for 1 week. Nausea and vomiting. Pancytopenia. Personal history of lung carcinoma. EXAM: CT CHEST, ABDOMEN, AND PELVIS WITH CONTRAST TECHNIQUE: Multidetector CT imaging of the chest, abdomen and pelvis was performed  following the standard protocol during bolus administration of intravenous contrast. CONTRAST:  144m OMNIPAQUE IOHEXOL 300 MG/ML  SOLN COMPARISON:  Chest CT on 11/20/2018 and PET-CT on 11/14/2017 FINDINGS: CT CHEST FINDINGS Cardiovascular: No acute findings. Aortic and coronary artery atherosclerosis. Mediastinum/Lymph Nodes: No masses or pathologically enlarged lymph nodes identified. Lungs/Pleura: Stable postop changes from right lower lobectomy. Mild centrilobular emphysema noted. A 6 mm pulmonary nodule in the lingula on image 100/3 remains stable, consistent with benign etiology. No new or enlarging pulmonary nodules or masses identified. No evidence of pulmonary infiltrate or pleural effusion. Mild bilateral pleural calcification is stable and consistent with asbestos related pleural disease. Musculoskeletal:  No suspicious bone lesions identified. CT ABDOMEN AND PELVIS FINDINGS Hepatobiliary: No masses identified. Pancreas:  No mass or inflammatory changes. Spleen: No evidence of splenomegaly. 2 small peripheral wedge-shaped low-attenuation lesions are seen in the spleen which were not definitely visualized on previous studies and are consistent with small splenic infarcts. Adrenals/Urinary tract: Stable 1.6 cm homogeneous right adrenal mass, consistent with benign adenoma. A few right renal cysts are also stable. No evidence of renal masses or hydronephrosis. Stomach/Bowel: Mild colonic diverticulosis is noted, however there is no evidence of diverticulitis. Normal appendix visualized. No evidence of bowel obstruction, inflammatory process, or abnormal fluid collections. Vascular/Lymphatic: No pathologically enlarged lymph nodes identified. Stable 3.0 cm infrarenal abdominal aortic aneurysm. Reproductive: Clips seen from probable prior prostatectomy. Limited visualization due to extensive beam hardening artifact from left hip prosthesis. Other:  None. Musculoskeletal:  No suspicious bone  lesions identified.  IMPRESSION: 1. Stable postop changes from right lower lobectomy. No evidence of recurrent or metastatic carcinoma within the chest, abdomen, or pelvis. 2. Two small splenic infarcts, not definitely visualized on previous studies. 3. Stable 3.0 cm infrarenal abdominal aortic aneurysm. Recommend followup by ultrasound in 3 years. This recommendation follows ACR consensus guidelines: White Paper of the ACR Incidental Findings Committee II on Vascular Findings. J Am Coll Radiol 2013; 01:749-449 4. Colonic diverticulosis. No radiographic evidence of diverticulitis. Electronically Signed   By: Marlaine Hind M.D.   On: 03/10/2019 07:16   Dg Chest Port 1 View  Result Date: 03/09/2019 CLINICAL DATA:  Fever EXAM: PORTABLE CHEST 1 VIEW COMPARISON:  06/24/2018 FINDINGS: Heart and mediastinal contours are within normal limits. No focal opacities or effusions. No acute bony abnormality. IMPRESSION: No active disease. Electronically Signed   By: Rolm Baptise M.D.   On: 03/09/2019 23:10    Micro Results    Recent Results (from the past 240 hour(s))  Urine Culture     Status: None   Collection Time: 03/07/19  4:43 PM   Specimen: Urine   URINE  Result Value Ref Range Status   Urine Culture, Routine Final report  Final   Organism ID, Bacteria No growth  Final  Microscopic Examination     Status: None   Collection Time: 03/07/19  4:43 PM   URINE  Result Value Ref Range Status   WBC, UA 0-5 0 - 5 /hpf Final   RBC 0-2 0 - 2 /hpf Final   Epithelial Cells (non renal) 0-10 0 - 10 /hpf Final   Casts None seen None seen /lpf Final   Mucus, UA Present Not Estab. Final   Bacteria, UA None seen None seen/Few Final  Urine culture     Status: None   Collection Time: 03/10/19 12:30 AM   Specimen: Urine, Clean Catch  Result Value Ref Range Status   Specimen Description   Final    URINE, CLEAN CATCH Performed at [facility], [address], [place], [place] [address]    Special Requests   Final    NONE Performed at  [facility], [address] [place], [place] [address]    Culture   Final    NO GROWTH Performed at [facility], [place] [address], [place], [place] [address]    Report Status 03/11/2019 FINAL  Final  Blood culture (routine x 2)     Status: None (Preliminary result)   Collection Time: 03/10/19 12:52 AM   Specimen: Right Antecubital; Blood  Result Value Ref Range Status   Specimen Description RIGHT ANTECUBITAL  Final   Special Requests   Final    BOTTLES DRAWN AEROBIC AND ANAEROBIC Blood Culture adequate volume   Culture   Final    NO GROWTH 1 DAY Performed at [facility], [address] [place], [place] [address]    Report Status PENDING  Incomplete  Blood culture (routine x 2)     Status: None (Preliminary result)   Collection Time: 03/10/19 12:56 AM   Specimen: Left Antecubital; Blood  Result Value Ref Range Status   Specimen Description LEFT ANTECUBITAL  Final   Special Requests   Final    BOTTLES DRAWN AEROBIC AND ANAEROBIC Blood Culture adequate volume   Culture   Final    NO GROWTH 1 DAY Performed at [facility], [address], [place],  [address]    Report Status PENDING  Incomplete  SARS CORONAVIRUS 2 (  TAT 6-24 HRS) Nasopharyngeal Nasopharyngeal Swab     Status: None   Collection Time: 03/10/19  3:15 AM   Specimen: Nasopharyngeal Swab  Result Value Ref Range Status   SARS Coronavirus 2 NEGATIVE NEGATIVE Final    Comment: (NOTE) SARS-CoV-2 target nucleic acids are NOT DETECTED. The SARS-CoV-2 RNA is generally detectable in upper and lower respiratory specimens during the acute phase of infection. Negative results do not preclude SARS-CoV-2 infection, do not rule out co-infections with other pathogens, and should not be used as the sole basis for treatment or other patient management decisions. Negative results must be combined with clinical observations, patient history, and epidemiological information. The expected result is Negative. Fact Sheet for  Patients: SugarRoll.be Fact Sheet for Healthcare Providers: https://www.woods-mathews.com/ This test is not yet approved or cleared by the [place] FDA and  has been authorized for detection and/or diagnosis of SARS-CoV-2 by FDA under an Emergency Use Authorization (EUA). This EUA will remain  in effect (meaning this test can be used) for the duration of the COVID-19 declaration under Section 56 4(b)(1) of the Act, 21 U.S.C. section 360bbb-3(b)(1), unless the authorization is terminated or revoked sooner. Performed at [facility], [place] [address] [place], [place] [address]        Today   Subjective    Timothy Guerrero today has no new concerns --No further fevers, no chills, wife at bedside, questions answered --Eating and drinking well, no nausea no vomiting or diarrhea          Patient has been seen and examined prior to discharge   Objective   Blood pressure 128/72, pulse 95, temperature 99 F (37.2 C), temperature source Oral, resp. rate 18, height 6' (1.829 m), weight 89.7 kg, SpO2 95 %.   Intake/Output Summary (Last 24 hours) at 03/11/2019 1514 Last data filed at 03/11/2019 1300 Gross per 24 hour  Intake 600 ml  Output --  Net 600 ml    Exam Gen:- Awake Alert, no acute distress , speaking in complete sentences HEENT:- [place].AT, No sclera icterus Neck-Supple Neck,No JVD,.  Lungs-  CTAB , good air movement bilaterally  CV- S1, S2 normal, regular Abd-  +ve B.Sounds, Abd Soft, No tenderness,    Extremity/Skin:- No  edema,   good pulses Psych-affect is appropriate, oriented x3 Neuro-no new focal deficits, no tremors    Data Review   CBC w Diff:  Lab Results  Component Value Date   WBC 2.4 (L) 03/11/2019   HGB 10.5 (L) 03/11/2019   HGB 14.8 04/03/2018   HCT 32.2 (L) 03/11/2019   HCT 44.3 04/03/2018   PLT 73 (L) 03/11/2019   PLT 254 04/03/2018   LYMPHOPCT 26 03/11/2019   MONOPCT 15 03/11/2019   EOSPCT 0  03/11/2019   BASOPCT 0 03/11/2019    CMP:  Lab Results  Component Value Date   NA 135 03/11/2019   NA 141 04/03/2018   K 4.2 03/11/2019   CL 101 03/11/2019   CO2 29 03/11/2019   BUN 18 03/11/2019   BUN 14 04/03/2018   CREATININE 0.94 03/11/2019   CREATININE 0.95 01/21/2018   PROT 5.8 (L) 03/11/2019   PROT 6.6 04/03/2018   ALBUMIN 2.4 (L) 03/11/2019   ALBUMIN 4.1 04/03/2018   BILITOT 0.9 03/11/2019   BILITOT 0.4 04/03/2018   ALKPHOS 75 03/11/2019   AST 27 03/11/2019   ALT 40 03/11/2019  .   Total Discharge time is about 33 minutes  Roxan Hockey M.D on  03/11/2019 at 3:14 PM  Go to [email or web address] -  for contact info  [facility] - Office  [phone]

## 2019-03-12 LAB — PROTEIN ELECTROPHORESIS, SERUM
A/G Ratio: 0.8 (ref 0.7–1.7)
Albumin ELP: 2.5 g/dL — ABNORMAL LOW (ref 2.9–4.4)
Alpha-1-Globulin: 0.3 g/dL (ref 0.0–0.4)
Alpha-2-Globulin: 1 g/dL (ref 0.4–1.0)
Beta Globulin: 1 g/dL (ref 0.7–1.3)
Gamma Globulin: 0.8 g/dL (ref 0.4–1.8)
Globulin, Total: 3.2 g/dL (ref 2.2–3.9)
Total Protein ELP: 5.7 g/dL — ABNORMAL LOW (ref 6.0–8.5)

## 2019-03-12 LAB — FOLATE RBC
Folate, Hemolysate: 390 ng/mL
Folate, RBC: 1089 ng/mL (ref >498)
Hematocrit: 35.8 % — ABNORMAL LOW (ref 37.5–51.0)

## 2019-03-12 LAB — ROCKY MTN SPOTTED FVR ABS PNL(IGG+IGM)
RMSF IgG: NEGATIVE
RMSF IgM: 0.58 index (ref 0.00–0.89)

## 2019-03-13 LAB — HUMAN PARVOVIRUS DNA DETECTION BY PCR: Parvovirus B19, PCR: NEGATIVE

## 2019-03-14 LAB — METHYLMALONIC ACID, SERUM: Methylmalonic Acid, Quantitative: 166 nmol/L (ref 0–378)

## 2019-03-15 LAB — CULTURE, BLOOD (ROUTINE X 2)
Culture: NO GROWTH
Culture: NO GROWTH
Special Requests: ADEQUATE
Special Requests: ADEQUATE

## 2019-03-15 LAB — COPPER, SERUM: Copper: 132 ug/dL (ref 72–166)

## 2019-03-16 ENCOUNTER — Other Ambulatory Visit: Payer: Self-pay | Admitting: Family Medicine

## 2019-03-17 ENCOUNTER — Encounter (HOSPITAL_COMMUNITY): Payer: Self-pay

## 2019-03-17 ENCOUNTER — Encounter: Payer: Self-pay | Admitting: Internal Medicine

## 2019-03-18 ENCOUNTER — Other Ambulatory Visit: Payer: Self-pay

## 2019-03-18 ENCOUNTER — Inpatient Hospital Stay (HOSPITAL_COMMUNITY): Payer: Medicare HMO | Attending: Hematology | Admitting: Hematology

## 2019-03-18 ENCOUNTER — Encounter (HOSPITAL_COMMUNITY): Payer: Self-pay | Admitting: Hematology

## 2019-03-18 ENCOUNTER — Inpatient Hospital Stay (HOSPITAL_COMMUNITY): Payer: Medicare HMO

## 2019-03-18 VITALS — BP 145/75 | HR 100 | Temp 97.1°F | Resp 18 | Wt 190.8 lb

## 2019-03-18 DIAGNOSIS — Z85118 Personal history of other malignant neoplasm of bronchus and lung: Secondary | ICD-10-CM | POA: Diagnosis not present

## 2019-03-18 DIAGNOSIS — I10 Essential (primary) hypertension: Secondary | ICD-10-CM | POA: Diagnosis not present

## 2019-03-18 DIAGNOSIS — Z87891 Personal history of nicotine dependence: Secondary | ICD-10-CM | POA: Diagnosis not present

## 2019-03-18 DIAGNOSIS — Z85820 Personal history of malignant melanoma of skin: Secondary | ICD-10-CM | POA: Diagnosis not present

## 2019-03-18 DIAGNOSIS — Z79899 Other long term (current) drug therapy: Secondary | ICD-10-CM | POA: Insufficient documentation

## 2019-03-18 DIAGNOSIS — E119 Type 2 diabetes mellitus without complications: Secondary | ICD-10-CM | POA: Insufficient documentation

## 2019-03-18 DIAGNOSIS — Z8546 Personal history of malignant neoplasm of prostate: Secondary | ICD-10-CM | POA: Insufficient documentation

## 2019-03-18 DIAGNOSIS — J449 Chronic obstructive pulmonary disease, unspecified: Secondary | ICD-10-CM | POA: Diagnosis not present

## 2019-03-18 DIAGNOSIS — Z7984 Long term (current) use of oral hypoglycemic drugs: Secondary | ICD-10-CM | POA: Insufficient documentation

## 2019-03-18 DIAGNOSIS — D61818 Other pancytopenia: Secondary | ICD-10-CM | POA: Insufficient documentation

## 2019-03-18 LAB — CBC WITH DIFFERENTIAL/PLATELET
Abs Immature Granulocytes: 0.01 10*3/uL (ref 0.00–0.07)
Basophils Absolute: 0 10*3/uL (ref 0.0–0.1)
Basophils Relative: 0 %
Eosinophils Absolute: 0 10*3/uL (ref 0.0–0.5)
Eosinophils Relative: 0 %
HCT: 40.8 % (ref 39.0–52.0)
Hemoglobin: 12.4 g/dL — ABNORMAL LOW (ref 13.0–17.0)
Immature Granulocytes: 0 %
Lymphocytes Relative: 62 %
Lymphs Abs: 2.4 10*3/uL (ref 0.7–4.0)
MCH: 27.1 pg (ref 26.0–34.0)
MCHC: 30.4 g/dL (ref 30.0–36.0)
MCV: 89.3 fL (ref 80.0–100.0)
Monocytes Absolute: 0.3 10*3/uL (ref 0.1–1.0)
Monocytes Relative: 7 %
Neutro Abs: 1.2 10*3/uL — ABNORMAL LOW (ref 1.7–7.7)
Neutrophils Relative %: 31 %
Platelets: 283 10*3/uL (ref 150–400)
RBC: 4.57 MIL/uL (ref 4.22–5.81)
RDW: 17.9 % — ABNORMAL HIGH (ref 11.5–15.5)
WBC: 3.9 10*3/uL — ABNORMAL LOW (ref 4.0–10.5)
nRBC: 0 % (ref 0.0–0.2)

## 2019-03-18 LAB — COMPREHENSIVE METABOLIC PANEL
ALT: 23 U/L (ref 0–44)
AST: 18 U/L (ref 15–41)
Albumin: 3.4 g/dL — ABNORMAL LOW (ref 3.5–5.0)
Alkaline Phosphatase: 74 U/L (ref 38–126)
Anion gap: 9 (ref 5–15)
BUN: 15 mg/dL (ref 8–23)
CO2: 27 mmol/L (ref 22–32)
Calcium: 9 mg/dL (ref 8.9–10.3)
Chloride: 103 mmol/L (ref 98–111)
Creatinine, Ser: 0.82 mg/dL (ref 0.61–1.24)
GFR calc Af Amer: >60 mL/min (ref >60)
GFR calc non Af Amer: >60 mL/min (ref >60)
Glucose, Bld: 210 mg/dL — ABNORMAL HIGH (ref 70–99)
Potassium: 4.7 mmol/L (ref 3.5–5.1)
Sodium: 139 mmol/L (ref 135–145)
Total Bilirubin: 0.6 mg/dL (ref 0.3–1.2)
Total Protein: 7 g/dL (ref 6.5–8.1)

## 2019-03-18 LAB — MAGNESIUM: Magnesium: 2.3 mg/dL (ref 1.7–2.4)

## 2019-03-18 LAB — LACTATE DEHYDROGENASE: LDH: 154 U/L (ref 98–192)

## 2019-03-18 NOTE — Progress Notes (Signed)
error 

## 2019-03-18 NOTE — Progress Notes (Signed)
CONSULT NOTE  Patient Care Team: Dettinger, Fransisca Kaufmann, MD as PCP - General (Family Medicine) Gala Romney Cristopher Estimable, MD as Consulting Physician (Gastroenterology) Martinique, Amy, MD as Consulting Physician (Dermatology) Minus Breeding, MD as Consulting Physician (Cardiology) Javier Glazier, MD as Consulting Physician (Pulmonary Disease) Ayesha Rumpf, PA-C  CHIEF COMPLAINTS/PURPOSE OF CONSULTATION:  Pancytopenia.  HISTORY OF PRESENTING ILLNESS:  Timothy Guerrero 78 y.o. male is seen in consultation today for further work-up and management of pancytopenia at the request of hospitalist service.  He was recently admitted to the hospital from 03/09/2019 through 03/11/2019.  Prior to that he reported 4 weeks of on and off fevers.  On presentation his white count was 2.4 and platelet count was 61.  Differential showed 63% neutrophils.  Extensive work-up including nutritional deficiencies was negative.  Work-up for infectious etiology was also ordered.  Since discharge home, he denied any fevers, night sweats or weight loss.  His energy levels have not improved completely.  Appetite is 75%.  Energy levels are 25%.  Numbness in the hands and feet has been stable.  Mild constipation is also stable.  Denies any bleeding per rectum or melena.  No nosebleeds or hematuria reported.  No recurrent infections prior to this episode.  He recollects that he had to have a blood transfusion in November 2017 when his hemoglobin dropped to 6.6.  He had a history of right lung cancer and underwent right lower lobectomy in June 2016 which showed PT 1 BPN 0.  He had a recurrence with hemoptysis in May 2019, bronchoscopy and washings showed non-small cell carcinoma.  He underwent radiation to the chest from June 2019 through January 31, 2018.  He lives by himself at home.  He quit smoking in 1988.  He smoked 1 pack/day for 30 years.  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  Family history significant for  mother who died of melanoma.  Sister died of breast cancer.  Son died of prostate cancer.  MEDICAL HISTORY:  Past Medical History:  Diagnosis Date  . Anemia   . Arthritis   . Atrial flutter ([place])    post op following lobectomy  . Benign essential tremor   . Blood transfusion without reported diagnosis   . Cataract   . CHF (congestive heart failure) ([place])   . Colon polyp    2007, polyp with early cancer, one year f/u no residual polyp. next TCS 2010, see PSH. [facility].  [name] COPD (chronic obstructive pulmonary disease) ([place])   . Diabetes mellitus without complication ([place])    10 years  . GERD (gastroesophageal reflux disease)   . HOH (hard of hearing)   . Hypercholesteremia   . Hypertension    10 years  . Lung cancer ([place])   . Prostate cancer ([place])   . Shortness of breath dyspnea    due to lung mass  . Skin melanoma ([place])   . Sleep apnea    wears CPAP sometimes, cannot tolerate all the time.    SURGICAL HISTORY: Past Surgical History:  Procedure Laterality Date  . bottom lobe of right lung removed  12/17/14  . CARDIAC CATHETERIZATION     no PCI  . CATARACT EXTRACTION Left   . CATARACT EXTRACTION W/PHACO Left 01/03/2016   Procedure: CATARACT EXTRACTION PHACO AND INTRAOCULAR LENS PLACEMENT LEFT EYE CDE=7.78;  Surgeon: Williams Che, MD;  Location: [facility];  Service: Ophthalmology;  Laterality: Left;  .  CATARACT EXTRACTION W/PHACO Right 03/20/2016   Procedure: CATARACT EXTRACTION PHACO AND INTRAOCULAR LENS PLACEMENT; CDE:  9.58;  Surgeon: Williams Che, MD;  Location: [facility];  Service: Ophthalmology;  Laterality: Right;  . COLONOSCOPY  08/2008   Dr. Leafy Half, [place]: normal, internal grade 1 hemorrhoids. next TCS 08/2013  . COLONOSCOPY N/A 03/04/2014   Procedure: COLONOSCOPY;  Surgeon: Daneil Dolin, MD;  Location: [facility];  Service: Endoscopy;  Laterality: N/A;  7:30am  . EYE SURGERY    . knee replacements     bilateral. over 10  years ago  . LOBECTOMY Right 12/17/2014   Procedure: RIGHT LOWER LOBECTOMY;  Surgeon: Melrose Nakayama, MD;  Location: [facility];  Service: Thoracic;  Laterality: Right;  . LYMPH NODE DISSECTION Right 12/17/2014   Procedure: LYMPH NODE DISSECTION;  Surgeon: Melrose Nakayama, MD;  Location: [facility];  Service: Thoracic;  Laterality: Right;  . NECK SURGERY     melanoma removed.  [name] PROSTATE SURGERY     prostatectomy, radical  . TOTAL HIP ARTHROPLASTY  2007   left  . total shoulder replacement  2011   left  . VIDEO ASSISTED THORACOSCOPY (VATS)/WEDGE RESECTION Right 12/17/2014   Procedure: RIGHT VIDEO ASSISTED THORACOSCOPY (VATS);  Surgeon: Melrose Nakayama, MD;  Location: [facility];  Service: Thoracic;  Laterality: Right;  [name] VIDEO BRONCHOSCOPY Bilateral 11/17/2016   Procedure: VIDEO BRONCHOSCOPY WITHOUT FLUORO;  Surgeon: Javier Glazier, MD;  Location[facility];  Service: Cardiopulmonary;  Laterality: Bilateral;  . VIDEO BRONCHOSCOPY N/A 11/01/2017   Procedure: VIDEO BRONCHOSCOPY;  Surgeon: Melrose Nakayama, MD;  Location: [facility];  Service: Thoracic;  Laterality: N/A;    SOCIAL HISTORY: Social History   Socioeconomic History  . Marital status: Divorced    Spouse name: Not on file  . Number of children: 3  . Years of education: associates  . Highest education level: Associate degree: occupational, Hotel manager, or vocational program  Occupational History  . 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 Financial resource strain: Not hard at all  . Food insecurity    Worry: Never true    Inability: Never true  . Transportation needs    Medical: No    Non-medical: No  Tobacco Use  . Smoking status: Former Smoker    Packs/day: 2.00    Years: 32.00    Pack years: 64.00    Types: Cigarettes    Quit date: 07/03/1986    Years since quitting: 32.7  . Smokeless tobacco: Never Used  Substance and Sexual Activity  . Alcohol use: Yes    Alcohol/week: 0.0 standard  drinks    Comment: rarely wine  . Drug use: No  . Sexual activity: Yes  Lifestyle  . Physical activity    Days per week: 0 days    Minutes per session: 0 min  . Stress: Only a little  Relationships  . Social connections    Talks on phone: More than three times a week    Gets together: More than three times a week    Attends religious service: Never    Active member of club or organization: Yes    Attends meetings of clubs or organizations: More than 4 times per year    Relationship status: Divorced  . Intimate partner violence    Fear of current or ex partner: No    Emotionally abused: No    Physically abused: No  Forced sexual activity: No  Other Topics Concern  . Not on file  Social History Narrative   [employer].  Lives alone.      [place] [facility]:   Originally from [place]. Served in [employer] with significant exposure to asbestos. 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 He has also lived in [place], [place], & [place]. He has lived in [place] since 1979. 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 No pets currently. Remote bird exposure.       Patient is divorced but has had a girlfriend for 7 months. He enjoys going dancing several times a week. He had 3 children but one passed away at 15 from prostate cancer. He has 17 grandchildren.     FAMILY HISTORY: Family History  Problem Relation Age of Onset  . Cancer Mother        unknown type  . COPD Sister   . Breast cancer Sister   . Cancer Sister 71       breast  . Heart attack Daughter   . Prostate cancer Son   . Colon cancer Neg Hx     ALLERGIES:  has No Known Allergies.  MEDICATIONS:  Current Outpatient Medications  Medication Sig Dispense Refill  . amLODipine (NORVASC) 10 MG tablet Take 1 tablet (10 mg total) by mouth daily. 90 tablet 3  . cholecalciferol (VITAMIN D3) 25 MCG (1000 UT) tablet Take 1,000 Units by mouth daily.    . fluticasone furoate-vilanterol (BREO ELLIPTA) 200-25 MCG/INH AEPB Inhale 1 puff into the lungs daily. 1  each 6  . furosemide (LASIX) 20 MG tablet TAKE 1 TABLET BY MOUTH EVERY DAY 30 tablet 0  . gabapentin (NEURONTIN) 300 MG capsule Take 300 mg by mouth 3 (three) times daily.    . insulin aspart protamine- aspart (NOVOLOG MIX 70/30) (70-30) 100 UNIT/ML injection Inject 35-40 Units into the skin.    [name] levothyroxine (SYNTHROID, LEVOTHROID) 50 MCG tablet Take 1 tablet (50 mcg total) by mouth daily before breakfast. ((for thyroid)) 90 tablet 2  . LORazepam (ATIVAN) 1 MG tablet Take 1 tablet (1 mg total) by mouth at bedtime as needed for anxiety. For anxiety, Take 1 hour prior up coming head scan. 15 tablet 2  . losartan (COZAAR) 50 MG tablet Take 25 mg by mouth daily.    . magnesium oxide (MAG-OX) 400 MG tablet Take 400 mg by mouth 2 (two) times daily.    . metFORMIN (GLUCOPHAGE) 1000 MG tablet Take 1 tablet (1,000 mg total) by mouth 2 (two) times daily with a meal. 180 tablet 3  . metoprolol (TOPROL-XL) 200 MG 24 hr tablet Take 1 tablet (200 mg total) by mouth daily. 90 tablet 3  . pantoprazole (PROTONIX) 40 MG tablet Take 1 tablet (40 mg total) by mouth daily. 90 tablet 3  . rosuvastatin (CRESTOR) 20 MG tablet Take 20 mg by mouth daily.    . Semaglutide (OZEMPIC) 0.25 or 0.5 MG/DOSE SOPN Inject 0.5 mg into the skin once a week. (Patient taking differently: Inject 1 mg into the skin once a week. ) 12 pen 1  . Tiotropium Bromide-Olodaterol (STIOLTO RESPIMAT IN) Inhale 1 puff into the lungs daily as needed (shortness of breath).    [name] acetaminophen (TYLENOL) 325 MG tablet Take 2 tablets (650 mg total) by mouth every 6 (six) hours as needed for mild pain, fever or headache (or Fever >/= 101). (Patient not taking: Reported on 03/17/2019) 12 tablet 1  . albuterol (PROVENTIL HFA;VENTOLIN  HFA) 108 (90 Base) MCG/ACT inhaler Inhale 1 puff into the lungs every 6 (six) hours as needed for wheezing or shortness of breath.    . benzonatate (TESSALON) 100 MG capsule Take 1 capsule (100 mg total) by mouth 2 (two) times  daily as needed for cough. (Patient not taking: Reported on 03/18/2019) 20 capsule 0  . Potassium Chloride ER 20 MEQ TBCR Take 20 mEq by mouth daily. Take only on days when taking lasix 90 tablet 2  . triamcinolone cream (KENALOG) 0.1 % Apply 1 application topically 2 (two) times daily. (Patient not taking: Reported on 03/18/2019) 30 g 0  . valACYclovir (VALTREX) 1000 MG tablet Take 1,000 mg by mouth daily as needed (cold sores).      No current facility-administered medications for this visit.     REVIEW OF SYSTEMS:   Constitutional: Denies fevers, chills or abnormal night sweats.  Positive for fatigue. Eyes: Denies blurriness of vision, double vision or watery eyes Ears, nose, mouth, throat, and face: Denies mucositis or sore throat Respiratory: Denies cough, dyspnea or wheezes Cardiovascular: Denies palpitation, chest discomfort or lower extremity swelling Gastrointestinal:  Denies nausea, heartburn or change in bowel habits.  Positive for constipation. Skin: Denies abnormal skin rashes Lymphatics: Denies new lymphadenopathy or easy bruising Neurological:Denies numbness, tingling or new weaknesses.  Mild numbness on and off in extremities. Behavioral/Psych: Mood is stable, no new changes  All other systems were reviewed with the patient and are negative.  PHYSICAL EXAMINATION: ECOG PERFORMANCE STATUS: 1 - Symptomatic but completely ambulatory  Vitals:   03/18/19 0932  BP: (!) 145/75  Pulse: 100  Resp: 18  Temp: (!) 97.1 F (36.2 C)  SpO2: 99%   Filed Weights   03/18/19 0932  Weight: 190 lb 12.8 oz (86.5 kg)    GENERAL:alert, no distress and comfortable SKIN: skin color, texture, turgor are normal, no rashes or significant lesions EYES: normal, conjunctiva are pink and non-injected, sclera clear OROPHARYNX:no exudate, no erythema and lips, buccal mucosa, and tongue normal  NECK: supple, thyroid normal size, non-tender, without nodularity LYMPH:  no palpable lymphadenopathy  in the cervical, axillary or inguinal LUNGS: clear to auscultation and percussion with normal breathing effort HEART: regular rate & rhythm and no murmurs and no lower extremity edema ABDOMEN:abdomen soft, non-tender and normal bowel sounds Musculoskeletal:no cyanosis of digits and no clubbing  PSYCH: alert & oriented x 3 with fluent speech NEURO: no focal motor/sensory deficits  LABORATORY DATA:  I have reviewed the data as listed Recent Results (from the past 2160 hour(s))  Urinalysis, Complete     Status: Abnormal   Collection Time: 03/07/19  4:43 PM  Result Value Ref Range   Specific Gravity, UA      >=1.030 (A) 1.005 - 1.030   pH, UA 5.5 5.0 - 7.5   Color, UA Yellow Yellow   Appearance Ur Clear Clear   Leukocytes,UA Negative Negative   Protein,UA 3+ (A) Negative/Trace   Glucose, UA 2+ (A) Negative   Ketones, UA 2+ (A) Negative   RBC, UA Negative Negative   Bilirubin, UA Negative Negative   Urobilinogen, Ur 1.0 0.2 - 1.0 mg/dL   Nitrite, UA Negative Negative   Microscopic Examination See below:     Comment: Microscopic was indicated and was performed.  Urine Culture     Status: None   Collection Time: 03/07/19  4:43 PM   Specimen: Urine   URINE  Result Value Ref Range   Urine Culture, Routine Final report  Organism ID, Bacteria No growth   Microscopic Examination     Status: None   Collection Time: 03/07/19  4:43 PM   URINE  Result Value Ref Range   WBC, UA 0-5 0 - 5 /hpf   RBC 0-2 0 - 2 /hpf   Epithelial Cells (non renal) 0-10 0 - 10 /hpf   Casts None seen None seen /lpf   Mucus, UA Present Not Estab.   Bacteria, UA None seen None seen/Few  CBG monitoring, ED     Status: Abnormal   Collection Time: 03/09/19  9:38 PM  Result Value Ref Range   Glucose-Capillary 237 (H) 70 - 99 mg/dL  CBC with Differential/Platelet     Status: Abnormal   Collection Time: 03/09/19 11:18 PM  Result Value Ref Range   WBC 2.4 (L) 4.0 - 10.5 K/uL   RBC 4.34 4.22 - 5.81 MIL/uL    Hemoglobin 11.8 (L) 13.0 - 17.0 g/dL   HCT 37.2 (L) 39.0 - 52.0 %   MCV 85.7 80.0 - 100.0 fL   MCH 27.2 26.0 - 34.0 pg   MCHC 31.7 30.0 - 36.0 g/dL   RDW 16.8 (H) 11.5 - 15.5 %   Platelets 61 (L) 150 - 400 K/uL    Comment: SPECIMEN CHECKED FOR CLOTS   nRBC 0.0 0.0 - 0.2 %   Neutrophils Relative % 63 %   Neutro Abs 1.5 (L) 1.7 - 7.7 K/uL   Lymphocytes Relative 19 %   Lymphs Abs 0.5 (L) 0.7 - 4.0 K/uL   Monocytes Relative 13 %   Monocytes Absolute 0.3 0.1 - 1.0 K/uL   Eosinophils Relative 0 %   Eosinophils Absolute 0.0 0.0 - 0.5 K/uL   Basophils Relative 0 %   Basophils Absolute 0.0 0.0 - 0.1 K/uL   Immature Granulocytes 5 %   Abs Immature Granulocytes 0.11 (H) 0.00 - 0.07 K/uL    Comment: Performed at [facility], [address] [place], [place] [address]  Comprehensive metabolic panel     Status: Abnormal   Collection Time: 03/09/19 11:18 PM  Result Value Ref Range   Sodium 133 (L) 135 - 145 mmol/L   Potassium 4.2 3.5 - 5.1 mmol/L   Chloride 100 98 - 111 mmol/L   CO2 26 22 - 32 mmol/L   Glucose, Bld 168 (H) 70 - 99 mg/dL   BUN 20 8 - 23 mg/dL   Creatinine, Ser 1.16 0.61 - 1.24 mg/dL   Calcium 8.5 (L) 8.9 - 10.3 mg/dL   Total Protein 6.3 (L) 6.5 - 8.1 g/dL   Albumin 2.8 (L) 3.5 - 5.0 g/dL   AST 34 15 - 41 U/L   ALT 47 (H) 0 - 44 U/L   Alkaline Phosphatase 88 38 - 126 U/L   Total Bilirubin 0.7 0.3 - 1.2 mg/dL   GFR calc non Af Amer >60 >60 mL/min   GFR calc Af Amer >60 >60 mL/min   Anion gap 7 5 - 15    Comment: Performed at [facility], [address] [place], [place] [address]  Lipase, blood     Status: Abnormal   Collection Time: 03/09/19 11:18 PM  Result Value Ref Range   Lipase 114 (H) 11 - 51 U/L    Comment: Performed at [facility], [address] [place], [place] [address]  Reticulocytes     Status: None   Collection Time: 03/09/19 11:18 PM  Result Value Ref Range   Retic Ct Pct 0.7 0.4 -  3.1 %   RBC. 4.35 4.22 - 5.81 MIL/uL   Retic Count, Absolute 30.9  19.0 - 186.0 K/uL   Immature Retic Fract 14.8 2.3 - 15.9 %    Comment: Performed at [facility], [address], [place], [place] [address]  Lactate dehydrogenase     Status: None   Collection Time: 03/09/19 11:18 PM  Result Value Ref Range   LDH 150 98 - 192 U/L    Comment: Performed at [facility], [address], [place], [place] [address]  Urinalysis, Routine w reflex microscopic     Status: Abnormal   Collection Time: 03/10/19 12:30 AM  Result Value Ref Range   Color, Urine YELLOW YELLOW   APPearance CLEAR CLEAR   Specific Gravity, Urine 1.027 1.005 - 1.030   pH 5.0 5.0 - 8.0   Glucose, UA >=500 (A) NEGATIVE mg/dL   Hgb urine dipstick NEGATIVE NEGATIVE   Bilirubin Urine NEGATIVE NEGATIVE   Ketones, ur NEGATIVE NEGATIVE mg/dL   Protein, ur 100 (A) NEGATIVE mg/dL   Nitrite NEGATIVE NEGATIVE   Leukocytes,Ua NEGATIVE NEGATIVE   RBC / HPF 0-5 0 - 5 RBC/hpf   WBC, UA 0-5 0 - 5 WBC/hpf   Bacteria, UA NONE SEEN NONE SEEN   Mucus PRESENT     Comment: Performed at [facility], [address], [place], [place] [address]  Urine culture     Status: None   Collection Time: 03/10/19 12:30 AM   Specimen: Urine, Clean Catch  Result Value Ref Range   Specimen Description      URINE, CLEAN CATCH Performed at [facility], [address] [place], [place] [address]    Special Requests      NONE Performed at [facility], [address], [place], [place] [address]    Culture      NO GROWTH Performed at [facility], [place] [address] [place], [place] [address]    Report Status 03/11/2019 FINAL   Blood culture (routine x 2)     Status: None   Collection Time: 03/10/19 12:52 AM   Specimen: Right Antecubital; Blood  Result Value Ref Range   Specimen Description RIGHT ANTECUBITAL    Special Requests      BOTTLES DRAWN AEROBIC AND ANAEROBIC Blood Culture adequate volume   Culture      NO GROWTH 5 DAYS Performed at [facility], [address] [place], [place] [address]    Report  Status 03/15/2019 FINAL   Blood culture (routine x 2)     Status: None   Collection Time: 03/10/19 12:56 AM   Specimen: Left Antecubital; Blood  Result Value Ref Range   Specimen Description LEFT ANTECUBITAL    Special Requests      BOTTLES DRAWN AEROBIC AND ANAEROBIC Blood Culture adequate volume   Culture      NO GROWTH 5 DAYS Performed at [facility], [address], [place], [place] [address]    Report Status 03/15/2019 FINAL   SARS CORONAVIRUS 2 (TAT 6-24 HRS) Nasopharyngeal Nasopharyngeal Swab     Status: None   Collection Time: 03/10/19  3:15 AM   Specimen: Nasopharyngeal Swab  Result Value Ref Range   SARS Coronavirus 2 NEGATIVE NEGATIVE    Comment: (NOTE) SARS-CoV-2 target nucleic acids are NOT DETECTED. The SARS-CoV-2 RNA is generally detectable in upper and lower respiratory specimens during the acute phase of infection. Negative results do not preclude SARS-CoV-2 infection, do not rule out co-infections with other pathogens, and should not  be used as the sole basis for treatment or other patient management decisions. Negative results must be combined with clinical observations, patient history, and epidemiological information. The expected result is Negative. Fact Sheet for Patients: SugarRoll.be Fact Sheet for Healthcare Providers: https://www.woods-mathews.com/ This test is not yet approved or cleared by the [place] FDA and  has been authorized for detection and/or diagnosis of SARS-CoV-2 by FDA under an Emergency Use Authorization (EUA). This EUA will remain  in effect (meaning this test can be used) for the duration of the COVID-19 declaration under Section 56 4(b)(1) of the Act, 21 U.S.C. section 360bbb-3(b)(1), unless the authorization is terminated or revoked sooner. Performed at [facility], [place] [address], [place], [place] [address]   Comprehensive metabolic panel     Status: Abnormal   Collection  Time: 03/10/19  5:27 AM  Result Value Ref Range   Sodium 135 135 - 145 mmol/L   Potassium 4.0 3.5 - 5.1 mmol/L   Chloride 102 98 - 111 mmol/L   CO2 27 22 - 32 mmol/L   Glucose, Bld 159 (H) 70 - 99 mg/dL   BUN 19 8 - 23 mg/dL   Creatinine, Ser 0.99 0.61 - 1.24 mg/dL   Calcium 8.6 (L) 8.9 - 10.3 mg/dL   Total Protein 6.2 (L) 6.5 - 8.1 g/dL   Albumin 2.8 (L) 3.5 - 5.0 g/dL   AST 36 15 - 41 U/L   ALT 46 (H) 0 - 44 U/L   Alkaline Phosphatase 86 38 - 126 U/L   Total Bilirubin 0.4 0.3 - 1.2 mg/dL   GFR calc non Af Amer >60 >60 mL/min   GFR calc Af Amer >60 >60 mL/min   Anion gap 6 5 - 15    Comment: Performed at [facility], [address], [place], [place] [address]  CBC     Status: Abnormal   Collection Time: 03/10/19  5:27 AM  Result Value Ref Range   WBC 2.1 (L) 4.0 - 10.5 K/uL   RBC 4.34 4.22 - 5.81 MIL/uL   Hemoglobin 11.7 (L) 13.0 - 17.0 g/dL   HCT 37.0 (L) 39.0 - 52.0 %   MCV 85.3 80.0 - 100.0 fL   MCH 27.0 26.0 - 34.0 pg   MCHC 31.6 30.0 - 36.0 g/dL   RDW 16.8 (H) 11.5 - 15.5 %   Platelets 62 (L) 150 - 400 K/uL    Comment: SPECIMEN CHECKED FOR CLOTS Immature Platelet Fraction may be clinically indicated, consider ordering this additional test [id number] CONSISTENT WITH PREVIOUS RESULT    nRBC 0.0 0.0 - 0.2 %    Comment: Performed at [facility], [address], [place], [place] [address]  HIV Antibody (routine testing w rflx)     Status: None   Collection Time: 03/10/19  5:27 AM  Result Value Ref Range   HIV Screen 4th Generation wRfx Non Reactive Non Reactive    Comment: (NOTE) Performed At: [facility] [place], [place] [id number] Rush Farmer MD LX:[id number]   Human parvovirus DNA detection by PCR     Status: None   Collection Time: 03/10/19  5:27 AM  Result Value Ref Range   Parvovirus B19, PCR Negative Negative    Comment: (NOTE) No Parvovirus B19 DNA detected. This test was developed and its performance  characteristics determined by [employer]. It has not been cleared or approved by the U.S. Food and Drug Administration. The FDA has determined that such clearance or approval is  not necessary. This test is used for clinical purposes. It should not be regarded as investigational or research. Performed At: [facility] [place], [place] [id number] Rush Farmer MD [id number]   Parvovirus B19 antibody, IgG and IgM     Status: Abnormal   Collection Time: 03/10/19  5:27 AM  Result Value Ref Range   Parovirus B19 IgG Abs 4.2 (H) 0.0 - 0.8 index    Comment: (NOTE)                                 Negative        <0.9                                 Equivocal  0.9 - 1.1                                 Positive        >1.1    Parovirus B19 IgM Abs 0.2 0.0 - 0.8 index    Comment: (NOTE)                                 Negative        <0.9                                 Equivocal  0.9 - 1.1                                 Positive        >1.1 Performed At: [facility] [place], [place] [id number] Rush Farmer MD JK:[id number]   B. burgdorfi antibodies     Status: None   Collection Time: 03/10/19  5:27 AM  Result Value Ref Range   B burgdorferi Ab IgG+IgM <0.91 0.00 - 0.90 ISR    Comment: (NOTE)                                Negative         <0.91                                Equivocal  0.91 - 1.09                                Positive         >1.09 Performed At: [facility] [address] [place], [place] [id number] Rush Farmer MD YB:[id number]   Hepatitis panel, acute     Status: None   Collection Time: 03/10/19  5:27 AM  Result Value Ref Range   Hepatitis B Surface Ag Negative Negative   HCV Ab 0.2 0.0 - 0.9 s/co ratio    Comment: (NOTE)  Negative:     < 0.8                             Indeterminate: 0.8 - 0.9                                  Positive:     > 0.9 The  CDC recommends that a positive HCV antibody result be followed up with a HCV Nucleic Acid Amplification test ([id number]). Performed At: [facility] [place], [place] [id number] Rush Farmer MD [id number]    Hep A IgM Negative Negative   Hep B C IgM Negative Negative  Ehrlichia antibody panel     Status: None   Collection Time: 03/10/19  5:27 AM  Result Value Ref Range   E chaffeensis (HGE) Ab, IgG Negative Neg:<1:64    Comment: (NOTE) HGE IgG levels are detectable 7 to 10 days post infection and persist approximately one year.    E chaffeensis (HGE) Ab, IgM Negative Neg:<1:20    Comment: (NOTE) Due to a reagent backorder, this test was performed using a different assay. The reference interval for this alternate assay is:                                       Negative      <1:64                                       Positive       1:64 or greater IgM levels usually rise 3 to 5 days post infection and fall to normal levels in approximately 30 to 60 days. Performed At: [facility] [place], [place] [id number] Rush Farmer MD [id number]    E.Chaffeensis (HME) IgG Negative Neg:<1:64   E. Chaffeensis (HME) IgM Titer Negative Neg:<1:20    Comment: (NOTE) IgG titers if 1:64 or greater indicate exposure or  acute and convalescent samples showing a four-fold increase, and/or the presence of IgM indicate recent or current infection.   Protein electrophoresis, serum     Status: Abnormal   Collection Time: 03/10/19  5:27 AM  Result Value Ref Range   Total Protein ELP 5.7 (L) 6.0 - 8.5 g/dL   Albumin ELP 2.5 (L) 2.9 - 4.4 g/dL   Alpha-1-Globulin 0.3 0.0 - 0.4 g/dL   Alpha-2-Globulin 1.0 0.4 - 1.0 g/dL   Beta Globulin 1.0 0.7 - 1.3 g/dL   Gamma Globulin 0.8 0.4 - 1.8 g/dL   M-Spike, % Not Observed Not Observed g/dL   SPE Interp. Comment     Comment: (NOTE) The SPE pattern reflects hypoalbuminemia. Evidence of monoclonal protein  is not apparent. Performed At: [facility] [place], [place] [id number] Rush Farmer MD [id number]    Comment Comment     Comment: (NOTE) Protein electrophoresis scan will follow via computer, mail, or courier delivery.    Globulin, Total 3.2 2.2 - 3.9 g/dL   A/G Ratio 0.8 0.7 - 1.7  Sedimentation rate     Status: Abnormal   Collection Time: 03/10/19  5:27 AM  Result Value Ref Range   Sed Rate 35 (H) 0 - 16 mm/hr  Comment: Performed at [facility], [address] [place], [place] [address]  ANA     Status: None   Collection Time: 03/10/19  5:27 AM  Result Value Ref Range   Anti Nuclear Antibody (ANA) Negative Negative    Comment: (NOTE) Performed At: [facility] [place], [place] [id number] Rush Farmer MD [id number]   Protime-INR     Status: Abnormal   Collection Time: 03/10/19  5:27 AM  Result Value Ref Range   Prothrombin Time 15.7 (H) 11.4 - 15.2 seconds   INR 1.3 (H) 0.8 - 1.2    Comment: (NOTE) INR goal varies based on device and disease states. Performed at [facility], [address], [place], [place] [address]   APTT     Status: None   Collection Time: 03/10/19  5:27 AM  Result Value Ref Range   aPTT 30 24 - 36 seconds    Comment: Performed at [facility], [address] [place], [place] [address]  Ferritin     Status: None   Collection Time: 03/10/19  5:28 AM  Result Value Ref Range   Ferritin 124 24 - 336 ng/mL    Comment: Performed at [facility], [address], [place], [place] [address]  Iron and TIBC     Status: Abnormal   Collection Time: 03/10/19  5:28 AM  Result Value Ref Range   Iron 28 (L) 45 - 182 ug/dL   TIBC 317 250 - 450 ug/dL   Saturation Ratios 9 (L) 17.9 - 39.5 %   UIBC 289 ug/dL    Comment: Performed at [facility], [address], [place], [place] [address]  Vitamin B12     Status: None   Collection Time: 03/10/19  5:28 AM  Result Value Ref Range   Vitamin B-12 434 180 -  914 pg/mL    Comment: (NOTE) This assay is not validated for testing neonatal or myeloproliferative syndrome specimens for Vitamin B12 levels. Performed at [facility], [address], [place], [place] [address]   Folate RBC     Status: Abnormal   Collection Time: 03/10/19  5:28 AM  Result Value Ref Range   Folate, Hemolysate 390.0 Not Estab. ng/mL   Hematocrit 35.8 (L) 37.5 - 51.0 %   Folate, RBC 1,089 >498 ng/mL    Comment: (NOTE) Performed At: [facility] [place], [place] [id number] Rush Farmer MD [id number]   C-reactive protein     Status: Abnormal   Collection Time: 03/10/19  5:28 AM  Result Value Ref Range   CRP 8.2 (H) <1.0 mg/dL    Comment: Performed at [facility], [address], [place],  [address]  PSA     Status: None   Collection Time: 03/10/19  5:28 AM  Result Value Ref Range   Prostatic Specific Antigen 0.76 0.00 - 4.00 ng/mL    Comment: (NOTE) While PSA levels of <=4.0 ng/ml are reported as reference range, some men with levels below 4.0 ng/ml can have prostate cancer and many men with PSA above 4.0 ng/ml do not have prostate cancer.  Other tests such as free PSA, age specific reference ranges, PSA velocity and PSA doubling time may be helpful especially in men less than 62 years old. Performed at [facility], [place] [address] [place], [place] [address]   Glucose, capillary     Status: Abnormal   Collection Time: 03/10/19  7:29 AM  Result Value Ref Range   Glucose-Capillary 170 (  H) 70 - 99 mg/dL  ECHOCARDIOGRAM COMPLETE     Status: None   Collection Time: 03/10/19  8:42 AM  Result Value Ref Range   Weight 3,164.04 oz   Height 72 in   BP 130/73 mmHg  Glucose, capillary     Status: Abnormal   Collection Time: 03/10/19 11:11 AM  Result Value Ref Range   Glucose-Capillary 265 (H) 70 - 99 mg/dL  Glucose, capillary     Status: Abnormal   Collection Time: 03/10/19  4:21 PM  Result Value Ref Range    Glucose-Capillary 277 (H) 70 - 99 mg/dL  Glucose, capillary     Status: Abnormal   Collection Time: 03/10/19  9:35 PM  Result Value Ref Range   Glucose-Capillary 377 (H) 70 - 99 mg/dL  CBC with Differential/Platelet     Status: Abnormal   Collection Time: 03/11/19  4:37 AM  Result Value Ref Range   WBC 2.4 (L) 4.0 - 10.5 K/uL   RBC 3.86 (L) 4.22 - 5.81 MIL/uL   Hemoglobin 10.5 (L) 13.0 - 17.0 g/dL   HCT 32.2 (L) 39.0 - 52.0 %   MCV 83.4 80.0 - 100.0 fL   MCH 27.2 26.0 - 34.0 pg   MCHC 32.6 30.0 - 36.0 g/dL   RDW 16.6 (H) 11.5 - 15.5 %   Platelets 73 (L) 150 - 400 K/uL    Comment: SPECIMEN CHECKED FOR CLOTS Immature Platelet Fraction may be clinically indicated, consider ordering this additional test [id number] CONSISTENT WITH PREVIOUS RESULT    nRBC 0.0 0.0 - 0.2 %   Neutrophils Relative % 58 %   Neutro Abs 1.4 (L) 1.7 - 7.7 K/uL   Lymphocytes Relative 26 %   Lymphs Abs 0.6 (L) 0.7 - 4.0 K/uL   Monocytes Relative 15 %   Monocytes Absolute 0.3 0.1 - 1.0 K/uL   Eosinophils Relative 0 %   Eosinophils Absolute 0.0 0.0 - 0.5 K/uL   Basophils Relative 0 %   Basophils Absolute 0.0 0.0 - 0.1 K/uL   Immature Granulocytes 1 %   Abs Immature Granulocytes 0.03 0.00 - 0.07 K/uL    Comment: Performed at [facility], [address] [place], [place] [address]  Comprehensive metabolic panel     Status: Abnormal   Collection Time: 03/11/19  4:37 AM  Result Value Ref Range   Sodium 135 135 - 145 mmol/L   Potassium 4.2 3.5 - 5.1 mmol/L   Chloride 101 98 - 111 mmol/L   CO2 29 22 - 32 mmol/L   Glucose, Bld 268 (H) 70 - 99 mg/dL   BUN 18 8 - 23 mg/dL   Creatinine, Ser 0.94 0.61 - 1.24 mg/dL   Calcium 8.3 (L) 8.9 - 10.3 mg/dL   Total Protein 5.8 (L) 6.5 - 8.1 g/dL   Albumin 2.4 (L) 3.5 - 5.0 g/dL   AST 27 15 - 41 U/L   ALT 40 0 - 44 U/L   Alkaline Phosphatase 75 38 - 126 U/L   Total Bilirubin 0.9 0.3 - 1.2 mg/dL   GFR calc non Af Amer >60 >60 mL/min   GFR calc Af Amer >60 >60 mL/min    Anion gap 5 5 - 15    Comment: Performed at [facility], [address] [place], [place] [address]  Glucose, capillary     Status: Abnormal   Collection Time: 03/11/19  8:11 AM  Result Value Ref Range   Glucose-Capillary 271 (H) 70 - 99 mg/dL  Rocky mtn  spotted fvr abs pnl(IgG+IgM)     Status: None   Collection Time: 03/11/19  8:56 AM  Result Value Ref Range   RMSF IgG Negative Negative   RMSF IgM 0.58 0.00 - 0.89 index    Comment: (NOTE)                                 Negative        <0.90                                 Equivocal 0.90 - 1.10                                 Positive        >1.10 Performed At: [facility] [place], [place] [id number] Rush Farmer MD [id number]   Lactate dehydrogenase     Status: None   Collection Time: 03/11/19  8:56 AM  Result Value Ref Range   LDH 148 98 - 192 U/L    Comment: Performed at [facility], [address] [place], [place] [address]  Methylmalonic acid, serum     Status: None   Collection Time: 03/11/19  8:56 AM  Result Value Ref Range   Methylmalonic Acid, Quantitative 166 0 - 378 nmol/L   Disclaimer: Comment     Comment: (NOTE) This test was developed and its performance characteristics determined by [facility]. It has not been cleared or approved by the Food and Drug Administration. Performed At: [facility] [address] [place], [place] [id number] Rush Farmer MD [id number]   Copper, serum     Status: None   Collection Time: 03/11/19  8:56 AM  Result Value Ref Range   Copper 132 72 - 166 ug/dL    Comment: (NOTE) This test was developed and its performance characteristics determined by [facility]. It has not been cleared or approved by the Food and Drug Administration.                                Detection Limit = 5 Performed At: [facility] [place], [place] [id number] Rush Farmer MD [id number]   Folate     Status: None   Collection Time:  03/11/19  8:56 AM  Result Value Ref Range   Folate 15.3 >5.9 ng/mL    Comment: Performed at [facility], [address] [place], [name] [address]  Glucose, capillary     Status: Abnormal   Collection Time: 03/11/19 10:55 AM  Result Value Ref Range   Glucose-Capillary 404 (H) 70 - 99 mg/dL  Glucose, random     Status: Abnormal   Collection Time: 03/11/19 11:59 AM  Result Value Ref Range   Glucose, Bld 493 (H) 70 - 99 mg/dL    Comment: Performed at [facility], [address], [place], [place] [address]  CBC with Differential/Platelet     Status: Abnormal   Collection Time: 03/18/19 10:39 AM  Result Value Ref Range   WBC 3.9 (L) 4.0 - 10.5 K/uL   RBC 4.57 4.22 - 5.81 MIL/uL   Hemoglobin 12.4 (L) 13.0 - 17.0 g/dL   HCT 40.8 39.0 - 52.0 %   MCV 89.3 80.0 - 100.0 fL  MCH 27.1 26.0 - 34.0 pg   MCHC 30.4 30.0 - 36.0 g/dL   RDW 17.9 (H) 11.5 - 15.5 %   Platelets 283 150 - 400 K/uL   nRBC 0.0 0.0 - 0.2 %   Neutrophils Relative % 31 %   Neutro Abs 1.2 (L) 1.7 - 7.7 K/uL   Lymphocytes Relative 62 %   Lymphs Abs 2.4 0.7 - 4.0 K/uL   Monocytes Relative 7 %   Monocytes Absolute 0.3 0.1 - 1.0 K/uL   Eosinophils Relative 0 %   Eosinophils Absolute 0.0 0.0 - 0.5 K/uL   Basophils Relative 0 %   Basophils Absolute 0.0 0.0 - 0.1 K/uL   WBC Morphology SMUDGE CELLS    Immature Granulocytes 0 %   Abs Immature Granulocytes 0.01 0.00 - 0.07 K/uL   Reactive, Benign Lymphocytes PRESENT     Comment: Performed at [facility], [address] [place], [place] [address]  Comprehensive metabolic panel     Status: Abnormal   Collection Time: 03/18/19 10:39 AM  Result Value Ref Range   Sodium 139 135 - 145 mmol/L   Potassium 4.7 3.5 - 5.1 mmol/L   Chloride 103 98 - 111 mmol/L   CO2 27 22 - 32 mmol/L   Glucose, Bld 210 (H) 70 - 99 mg/dL   BUN 15 8 - 23 mg/dL   Creatinine, Ser 0.82 0.61 - 1.24 mg/dL   Calcium 9.0 8.9 - 10.3 mg/dL   Total Protein 7.0 6.5 - 8.1 g/dL   Albumin 3.4 (L) 3.5 - 5.0 g/dL    AST 18 15 - 41 U/L   ALT 23 0 - 44 U/L   Alkaline Phosphatase 74 38 - 126 U/L   Total Bilirubin 0.6 0.3 - 1.2 mg/dL   GFR calc non Af Amer >60 >60 mL/min   GFR calc Af Amer >60 >60 mL/min   Anion gap 9 5 - 15    Comment: Performed at [facility], [address] [place], [place] [address]  Lactate dehydrogenase     Status: None   Collection Time: 03/18/19 10:39 AM  Result Value Ref Range   LDH 154 98 - 192 U/L    Comment: Performed at [facility], [address], [place], [place] [address]  Magnesium     Status: None   Collection Time: 03/18/19 10:39 AM  Result Value Ref Range   Magnesium 2.3 1.7 - 2.4 mg/dL    Comment: Performed at [facility], [address] [place], [place] [address]    RADIOGRAPHIC STUDIES: I have personally reviewed the radiological images as listed and agreed with the findings in the report. Ct Chest W Contrast  Result Date: 03/10/2019 CLINICAL DATA:  Fever of unknown origin for 1 week. Nausea and vomiting. Pancytopenia. Personal history of lung carcinoma. EXAM: CT CHEST, ABDOMEN, AND PELVIS WITH CONTRAST TECHNIQUE: Multidetector CT imaging of the chest, abdomen and pelvis was performed following the standard protocol during bolus administration of intravenous contrast. CONTRAST:  120mL OMNIPAQUE IOHEXOL 300 MG/ML  SOLN COMPARISON:  Chest CT on 11/20/2018 and PET-CT on 11/14/2017 FINDINGS: CT CHEST FINDINGS Cardiovascular: No acute findings. Aortic and coronary artery atherosclerosis. Mediastinum/Lymph Nodes: No masses or pathologically enlarged lymph nodes identified. Lungs/Pleura: Stable postop changes from right lower lobectomy. Mild centrilobular emphysema noted. A 6 mm pulmonary nodule in the lingula on image 100/3 remains stable, consistent with benign etiology. No new or enlarging pulmonary nodules or masses identified. No evidence of pulmonary infiltrate or pleural effusion. 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 Musculoskeletal:  No suspicious bone lesions identified. CT ABDOMEN AND PELVIS FINDINGS Hepatobiliary: No masses identified. Pancreas:  No mass or inflammatory changes. Spleen: No evidence of splenomegaly. 2 small peripheral wedge-shaped low-attenuation lesions are seen in the spleen which were not definitely visualized on previous studies and are consistent with small splenic infarcts. Adrenals/Urinary tract: Stable 1.6 cm homogeneous right adrenal mass, consistent with benign adenoma. A few right renal cysts are also stable. No evidence of renal masses or hydronephrosis. Stomach/Bowel: Mild colonic diverticulosis is noted, however there is no evidence of diverticulitis. Normal appendix visualized. No evidence of bowel obstruction, inflammatory process, or abnormal fluid collections. Vascular/Lymphatic: No pathologically enlarged lymph nodes identified. Stable 3.0 cm infrarenal abdominal aortic aneurysm. Reproductive: Clips seen from probable prior prostatectomy. Limited visualization due to extensive beam hardening artifact from left hip prosthesis. Other:  None. Musculoskeletal:  No suspicious bone lesions identified. IMPRESSION: 1. Stable postop changes from right lower lobectomy. No evidence of recurrent or metastatic carcinoma within the chest, abdomen, or pelvis. 2. Two small splenic infarcts, not definitely visualized on previous studies. 3. Stable 3.0 cm infrarenal abdominal aortic aneurysm. Recommend followup by ultrasound in 3 years. This recommendation follows ACR consensus guidelines: White Paper of the ACR Incidental Findings Committee II on Vascular Findings. J Am Coll Radiol 2013; 44:315-400 4. Colonic diverticulosis. No radiographic evidence of diverticulitis. Electronically Signed   By: Marlaine Hind M.D.   On: 03/10/2019 07:16   Ct Abdomen Pelvis W Contrast  Result Date: 03/10/2019 CLINICAL DATA:  Fever of unknown origin for 1 week. Nausea and vomiting. Pancytopenia. Personal  history of lung carcinoma. EXAM: CT CHEST, ABDOMEN, AND PELVIS WITH CONTRAST TECHNIQUE: Multidetector CT imaging of the chest, abdomen and pelvis was performed following the standard protocol during bolus administration of intravenous contrast. CONTRAST:  115mL OMNIPAQUE IOHEXOL 300 MG/ML  SOLN COMPARISON:  Chest CT on 11/20/2018 and PET-CT on 11/14/2017 FINDINGS: CT CHEST FINDINGS Cardiovascular: No acute findings. Aortic and coronary artery atherosclerosis. Mediastinum/Lymph Nodes: No masses or pathologically enlarged lymph nodes identified. Lungs/Pleura: Stable postop changes from right lower lobectomy. Mild centrilobular emphysema noted. A 6 mm pulmonary nodule in the lingula on image 100/3 remains stable, consistent with benign etiology. No new or enlarging pulmonary nodules or masses identified. No evidence of pulmonary infiltrate or pleural effusion. Mild bilateral pleural calcification is stable and consistent with asbestos related pleural disease. Musculoskeletal:  No suspicious bone lesions identified. CT ABDOMEN AND PELVIS FINDINGS Hepatobiliary: No masses identified. Pancreas:  No mass or inflammatory changes. Spleen: No evidence of splenomegaly. 2 small peripheral wedge-shaped low-attenuation lesions are seen in the spleen which were not definitely visualized on previous studies and are consistent with small splenic infarcts. Adrenals/Urinary tract: Stable 1.6 cm homogeneous right adrenal mass, consistent with benign adenoma. A few right renal cysts are also stable. No evidence of renal masses or hydronephrosis. Stomach/Bowel: Mild colonic diverticulosis is noted, however there is no evidence of diverticulitis. Normal appendix visualized. No evidence of bowel obstruction, inflammatory process, or abnormal fluid collections. Vascular/Lymphatic: No pathologically enlarged lymph nodes identified. Stable 3.0 cm infrarenal abdominal aortic aneurysm. Reproductive: Clips seen from probable prior prostatectomy.  Limited visualization due to extensive beam hardening artifact from left hip prosthesis. Other:  None. Musculoskeletal:  No suspicious bone lesions identified. IMPRESSION: 1. Stable postop changes from right lower lobectomy. No evidence of recurrent or metastatic carcinoma within the chest, abdomen, or pelvis. 2. Two small splenic  infarcts, not definitely visualized on previous studies. 3. Stable 3.0 cm infrarenal abdominal aortic aneurysm. Recommend followup by ultrasound in 3 years. This recommendation follows ACR consensus guidelines: White Paper of the ACR Incidental Findings Committee II on Vascular Findings. J Am Coll Radiol 2013; 69:629-528 4. Colonic diverticulosis. No radiographic evidence of diverticulitis. Electronically Signed   By: Marlaine Hind M.D.   On: 03/10/2019 07:16   Dg Chest Port 1 View  Result Date: 03/09/2019 CLINICAL DATA:  Fever EXAM: PORTABLE CHEST 1 VIEW COMPARISON:  06/24/2018 FINDINGS: Heart and mediastinal contours are within normal limits. No focal opacities or effusions. No acute bony abnormality. IMPRESSION: No active disease. Electronically Signed   By: Rolm Baptise M.D.   On: 03/09/2019 23:10    ASSESSMENT & PLAN:  Pancytopenia ([place]) 1.  Pancytopenia: - Admitted to the hospital from 03/09/2019 through 03/11/2019 with 4-week history of intermittent fevers. - Extensive work-up for nutritional deficiencies was negative.  Work-up for infectious causes including parvo U13, ehrlichiosis, [facility] spotted fever, Lyme disease, hepatitis, HIV were negative.  ANA was also negative. - He denies any fevers, night sweats or weight loss since discharge from the hospital. -He reported blood transfusion in November 2017 when his hemoglobin dropped to 6.6. - His energy levels have improved minimally. - I have reviewed and discussed the results of all his inpatient work-up. -I have recommended repeating a CBC today.  If there is no improvement in the labs, I have recommended bone  marrow aspiration and biopsy.  I have also given a follow-up visit in 4 weeks.  2.  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 -Bronchial washings for hemoptysis on 11/01/2017 shows malignant cells consistent with non-small cell carcinoma. 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   Total time spent is 60 minutes with more than 50% of the time spent face-to-face discussing and reviewing lab results, further work-up, counseling and coordination of care.  All questions were answered. The patient knows to call the clinic with any problems, questions or concerns.      Derek Jack, MD 03/18/19 6:57 PM

## 2019-03-18 NOTE — Patient Instructions (Addendum)
Timothy Guerrero at [facility] Discharge Instructions  You were seen today by Dr. Delton Coombes. He went over your history, family history and how you've been feeling since leaving the hospital. He will have blood work drawn today. He will see you back in 4 weeks for labs and follow up.   Thank you for choosing [place] at [facility] to provide your oncology and hematology care.  To afford each patient quality time with our provider, please arrive at least 15 minutes before your scheduled appointment time.   If you have a lab appointment with the [facility] please come in thru the  Main Entrance and check in at the main information desk  You need to re-schedule your appointment should you arrive 10 or more minutes late.  We strive to give you quality time with our providers, and arriving late affects you and other patients whose appointments are after yours.  Also, if you no show three or more times for appointments you may be dismissed from the clinic at the providers discretion.     Again, thank you for choosing [facility].  Our hope is that these requests will decrease the amount of time that you wait before being seen by our physicians.       _____________________________________________________________  Should you have questions after your visit to [facility], please contact our office at [phone] between the hours of 8:00 a.m. and 4:30 p.m.  Voicemails left after 4:00 p.m. will not be returned until the following business day.  For prescription refill requests, have your pharmacy contact our office and allow 72 hours.    Cancer Center Support Programs:   > Cancer Support Group  2nd Tuesday of the month 1pm-2pm, Journey Room

## 2019-03-18 NOTE — Assessment & Plan Note (Signed)
1.  Pancytopenia: - Admitted to the hospital from 03/09/2019 through 03/11/2019 with 4-week history of intermittent fevers. - Extensive work-up for nutritional deficiencies was negative.  Work-up for infectious causes including parvo Y17, ehrlichiosis, [facility] spotted fever, Lyme disease, hepatitis, HIV were negative.  ANA was also negative. - He denies any fevers, night sweats or weight loss since discharge from the hospital. -He reported blood transfusion in November 2017 when his hemoglobin dropped to 6.6. - His energy levels have improved minimally. - I have reviewed and discussed the results of all his inpatient work-up. -I have recommended repeating a CBC today.  If there is no improvement in the labs, I have recommended bone marrow aspiration and biopsy.  I have also given a follow-up visit in 4 weeks.  2.  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 -Bronchial washings for hemoptysis on 11/01/2017 shows malignant cells consistent with non-small cell carcinoma. 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

## 2019-03-25 ENCOUNTER — Other Ambulatory Visit: Payer: Self-pay

## 2019-03-25 ENCOUNTER — Encounter: Payer: Self-pay | Admitting: Physician Assistant

## 2019-03-25 ENCOUNTER — Ambulatory Visit (INDEPENDENT_AMBULATORY_CARE_PROVIDER_SITE_OTHER): Payer: Medicare HMO | Admitting: Physician Assistant

## 2019-03-25 VITALS — BP 132/73 | HR 106 | Temp 97.0°F | Ht 72.0 in | Wt 189.8 lb

## 2019-03-25 DIAGNOSIS — D61818 Other pancytopenia: Secondary | ICD-10-CM

## 2019-03-26 ENCOUNTER — Telehealth: Payer: Self-pay | Admitting: Physician Assistant

## 2019-03-26 LAB — CBC WITH DIFFERENTIAL/PLATELET
Basophils Absolute: 0 10*3/uL (ref 0.0–0.2)
Basos: 1 %
EOS (ABSOLUTE): 0 10*3/uL (ref 0.0–0.4)
Eos: 0 %
Hematocrit: 39.2 % (ref 37.5–51.0)
Hemoglobin: 12.3 g/dL — ABNORMAL LOW (ref 13.0–17.7)
Immature Grans (Abs): 0 10*3/uL (ref 0.0–0.1)
Immature Granulocytes: 0 %
Lymphocytes Absolute: 1.4 10*3/uL (ref 0.7–3.1)
Lymphs: 37 %
MCH: 26.3 pg — ABNORMAL LOW (ref 26.6–33.0)
MCHC: 31.4 g/dL — ABNORMAL LOW (ref 31.5–35.7)
MCV: 84 fL (ref 79–97)
Monocytes Absolute: 0.6 10*3/uL (ref 0.1–0.9)
Monocytes: 16 %
Neutrophils Absolute: 1.8 10*3/uL (ref 1.4–7.0)
Neutrophils: 46 %
Platelets: 261 10*3/uL (ref 150–450)
RBC: 4.68 x10E6/uL (ref 4.14–5.80)
RDW: 16.7 % — ABNORMAL HIGH (ref 11.6–15.4)
WBC: 3.9 10*3/uL (ref 3.4–10.8)

## 2019-03-26 NOTE — Telephone Encounter (Signed)
Aware of results. 

## 2019-03-27 NOTE — Progress Notes (Signed)
BP 132/73   Pulse (!) 106   Temp (!) 97 F (36.1 C) (Temporal)   Ht 6' (1.829 m)   Wt 189 lb 12.8 oz (86.1 kg)   SpO2 99%   BMI 25.74 kg/m    Subjective:    Patient ID: Timothy Guerrero, male    DOB: 1941/04/16, 78 y.o.   MRN: [id number]  HPI: Timothy Guerrero is a 78 y.o. male presenting on 03/25/2019 for low platelets and Fatigue  The patient had had low-grade temps for weeks.  He states it will be recurrent.  He has dealt with lung cancer in the past and radiation.  While he was admitted a lot of testing was performed and the most bothersome thing was the thrombocytopenia.  This patient comes in for recheck after finding his levels to be very low in the hospital.  He had platelets to be as down as low as 60.  He has been seen by hematology once and they performed labs and it has come back close to normal.  The red blood cells and hemoglobin and hematocrit are just outside of the normal range.  I have encouraged him to continue following with a hematologist and we can have him come once a week to have these labs tested if that will make him feel more comfortable.  Past Medical History:  Diagnosis Date  . Anemia   . Arthritis   . Atrial flutter ([place])    post op following lobectomy  . Benign essential tremor   . Blood transfusion without reported diagnosis   . Cataract   . CHF (congestive heart failure) ([place])   . Colon polyp    2007, polyp with early cancer, one year f/u no residual polyp. next TCS 2010, see PSH. [facility].  [name] COPD (chronic obstructive pulmonary disease) ([place])   . Diabetes mellitus without complication ([place])    10 years  . GERD (gastroesophageal reflux disease)   . HOH (hard of hearing)   . Hypercholesteremia   . Hypertension    10 years  . Lung cancer ([place])   . Prostate cancer ([place])   . Shortness of breath dyspnea    due to lung mass  . Skin melanoma ([place])   . Sleep apnea    wears CPAP sometimes, cannot tolerate all the time.    Relevant past medical, surgical, family and social history reviewed and updated as indicated. Interim medical history since our last visit reviewed. Allergies and medications reviewed and updated. DATA REVIEWED: CHART IN EPIC  Family History reviewed for pertinent findings.  Review of Systems  Constitutional: Positive for chills, fatigue and fever. Negative for appetite change and unexpected weight change.  Eyes: Negative for pain and visual disturbance.  Respiratory: Negative.  Negative for cough, chest tightness, shortness of breath and wheezing.   Cardiovascular: Negative.  Negative for chest pain, palpitations and leg swelling.  Gastrointestinal: Negative.  Negative for abdominal pain, diarrhea, nausea and vomiting.  Genitourinary: Negative.   Skin: Negative.  Negative for color change and rash.  Neurological: Positive for weakness. Negative for numbness and headaches.  Psychiatric/Behavioral: Negative.     Allergies as of 03/25/2019   No Known Allergies     Medication List       Accurate as of March 25, 2019 11:59 PM. If you have any questions, ask your nurse or doctor.        acetaminophen 325 MG tablet Commonly known as: TYLENOL Take  2 tablets (650 mg total) by mouth every 6 (six) hours as needed for mild pain, fever or headache (or Fever >/= 101).   albuterol 108 (90 Base) MCG/ACT inhaler Commonly known as: VENTOLIN HFA Inhale 1 puff into the lungs every 6 (six) hours as needed for wheezing or shortness of breath.   amLODipine 10 MG tablet Commonly known as: NORVASC Take 1 tablet (10 mg total) by mouth daily.   benzonatate 100 MG capsule Commonly known as: TESSALON Take 1 capsule (100 mg total) by mouth 2 (two) times daily as needed for cough.   cholecalciferol 25 MCG (1000 UT) tablet Commonly known as: VITAMIN D3 Take 1,000 Units by mouth daily.   fluticasone furoate-vilanterol 200-25 MCG/INH Aepb Commonly known as: Breo Ellipta Inhale 1 puff into the  lungs daily.   furosemide 20 MG tablet Commonly known as: LASIX TAKE 1 TABLET BY MOUTH EVERY DAY   gabapentin 300 MG capsule Commonly known as: NEURONTIN Take 300 mg by mouth 3 (three) times daily.   insulin aspart protamine- aspart (70-30) 100 UNIT/ML injection Commonly known as: NOVOLOG MIX 70/30 Inject 35-40 Units into the skin.   levothyroxine 50 MCG tablet Commonly known as: SYNTHROID Take 1 tablet (50 mcg total) by mouth daily before breakfast. ((for thyroid))   LORazepam 1 MG tablet Commonly known as: ATIVAN Take 1 tablet (1 mg total) by mouth at bedtime as needed for anxiety. For anxiety, Take 1 hour prior up coming head scan.   losartan 50 MG tablet Commonly known as: COZAAR Take 25 mg by mouth daily.   magnesium oxide 400 MG tablet Commonly known as: MAG-OX Take 400 mg by mouth 2 (two) times daily.   metFORMIN 1000 MG tablet Commonly known as: GLUCOPHAGE Take 1 tablet (1,000 mg total) by mouth 2 (two) times daily with a meal.   metoprolol 200 MG 24 hr tablet Commonly known as: TOPROL-XL Take 1 tablet (200 mg total) by mouth daily.   pantoprazole 40 MG tablet Commonly known as: PROTONIX Take 1 tablet (40 mg total) by mouth daily.   Potassium Chloride ER 20 MEQ Tbcr Take 20 mEq by mouth daily. Take only on days when taking lasix   rosuvastatin 20 MG tablet Commonly known as: CRESTOR Take 20 mg by mouth daily.   Semaglutide(0.25 or 0.5MG /DOS) 2 MG/1.5ML Sopn Commonly known as: Ozempic (0.25 or 0.5 MG/DOSE) Inject 0.5 mg into the skin once a week. What changed: how much to take   STIOLTO RESPIMAT IN Inhale 1 puff into the lungs daily as needed (shortness of breath).   triamcinolone cream 0.1 % Commonly known as: KENALOG Apply 1 application topically 2 (two) times daily.   Valtrex 1000 MG tablet Generic drug: valACYclovir Take 1,000 mg by mouth daily as needed (cold sores).          Objective:    BP 132/73   Pulse (!) 106   Temp (!) 97 F  (36.1 C) (Temporal)   Ht 6' (1.829 m)   Wt 189 lb 12.8 oz (86.1 kg)   SpO2 99%   BMI 25.74 kg/m   No Known Allergies  Wt Readings from Last 3 Encounters:  03/25/19 189 lb 12.8 oz (86.1 kg)  03/18/19 190 lb 12.8 oz (86.5 kg)  03/10/19 197 lb 12 oz (89.7 kg)    Physical Exam Vitals signs and nursing note reviewed.  Constitutional:      General: He is not in acute distress.    Appearance: He is well-developed.  HENT:  Head: Normocephalic and atraumatic.  Eyes:     Conjunctiva/sclera: Conjunctivae normal.     Pupils: Pupils are equal, round, and reactive to light.  Cardiovascular:     Rate and Rhythm: Normal rate and regular rhythm.     Heart sounds: Normal heart sounds.  Pulmonary:     Effort: Pulmonary effort is normal. No respiratory distress.     Breath sounds: Normal breath sounds.  Skin:    General: Skin is warm and dry.  Psychiatric:        Behavior: Behavior normal.     Results for orders placed or performed in visit on 03/25/19  CBC with Differential/Platelet  Result Value Ref Range   WBC 3.9 3.4 - 10.8 x10E3/uL   RBC 4.68 4.14 - 5.80 x10E6/uL   Hemoglobin 12.3 (L) 13.0 - 17.7 g/dL   Hematocrit 39.2 37.5 - 51.0 %   MCV 84 79 - 97 fL   MCH 26.3 (L) 26.6 - 33.0 pg   MCHC 31.4 (L) 31.5 - 35.7 g/dL   RDW 16.7 (H) 11.6 - 15.4 %   Platelets 261 150 - 450 x10E3/uL   Neutrophils 46 Not Estab. %   Lymphs 37 Not Estab. %   Monocytes 16 Not Estab. %   Eos 0 Not Estab. %   Basos 1 Not Estab. %   Neutrophils Absolute 1.8 1.4 - 7.0 x10E3/uL   Lymphocytes Absolute 1.4 0.7 - 3.1 x10E3/uL   Monocytes Absolute 0.6 0.1 - 0.9 x10E3/uL   EOS (ABSOLUTE) 0.0 0.0 - 0.4 x10E3/uL   Basophils Absolute 0.0 0.0 - 0.2 x10E3/uL   Immature Granulocytes 0 Not Estab. %   Immature Grans (Abs) 0.0 0.0 - 0.1 x10E3/uL      Assessment & Plan:   1. Pancytopenia ([place])  Continue following with hematologist - CBC with Differential/Platelet; Standing - CBC with Differential/Platelet  May have the labs performed on a weekly basis if needed.  Continue all other maintenance medications as listed above.  Follow up plan: Follow-up with PCP.  Educational handout given for no  Terald Sleeper PA-C [facility] [address]  [place], [place] [address] [phone]   03/27/2019, 2:33 PM

## 2019-04-01 ENCOUNTER — Ambulatory Visit (INDEPENDENT_AMBULATORY_CARE_PROVIDER_SITE_OTHER): Payer: Medicare HMO | Admitting: *Deleted

## 2019-04-01 DIAGNOSIS — Z0000 Encounter for general adult medical examination without abnormal findings: Secondary | ICD-10-CM | POA: Diagnosis not present

## 2019-04-01 NOTE — Progress Notes (Signed)
MEDICARE ANNUAL WELLNESS VISIT  04/01/2019  Telephone Visit Disclaimer This Medicare AWV was conducted by telephone due to national recommendations for restrictions regarding the COVID-19 Pandemic (e.g. social distancing).  I verified, using two identifiers, that I am speaking with Timothy Guerrero or their authorized healthcare agent. I discussed the limitations, risks, security, and privacy concerns of performing an evaluation and management service by telephone and the potential availability of an in-person appointment in the future. The patient expressed understanding and agreed to proceed.   Subjective:  Timothy Guerrero is a 78 y.o. male patient of Dettinger, Timothy Kaufmann, MD who had a Medicare Annual Wellness Visit today via telephone. Timothy Guerrero is Retired and lives alone. he has 2 living children and 1 deceased. he reports that he is socially active and does interact with friends/family regularly. he is minimally physically active and enjoys sodoku crossword puzzles, word jumble and doing jigsaw puzzles, .  Patient Care Team: Dettinger, Timothy Kaufmann, MD as PCP - General (Family Medicine) Gala Romney, Cristopher Estimable, MD as Consulting Physician (Gastroenterology) Martinique, Amy, MD as Consulting Physician (Dermatology) Minus Breeding, MD as Consulting Physician (Cardiology) Javier Glazier, MD as Consulting Physician (Pulmonary Disease) Ayesha Rumpf, [place]  Advanced Directives 04/01/2019 03/18/2019 03/17/2019 03/10/2019 03/09/2019 11/21/2018 07/11/2018  Does Patient Have a Medical Advance Directive? Yes Yes Yes Yes No Yes Yes  Type of Paramedic of [place];Living will [place];Living will [place];Living will Living will - Living will [place];Living will  Does patient want to make changes to medical advance directive? No - Patient declined - No - Patient declined No - Patient declined - - -  Copy of [place] in Chart? No - copy requested No - copy requested No - copy requested - - - No - copy requested  Would patient like information on creating a medical advance directive? - - - - - [facility] Utilization Over the Past 12 Months: # of hospitalizations or ER visits: 1 # of surgeries: 0  Review of Systems    Patient reports that his overall health is worse compared to last year.  History obtained from chart review  Patient Reported Readings (BP, Pulse, CBG, Weight, etc) none  Pain Assessment Pain : No/denies pain     Current Medications & Allergies (verified) Allergies as of 04/01/2019   No Known Allergies     Medication List       Accurate as of April 01, 2019  9:05 AM. If you have any questions, ask your nurse or doctor.        STOP taking these medications   benzonatate 100 MG capsule Commonly known as: TESSALON     TAKE these medications   acetaminophen 325 MG tablet Commonly known as: TYLENOL Take 2 tablets (650 mg total) by mouth every 6 (six) hours as needed for mild pain, fever or headache (or Fever >/= 101).   albuterol 108 (90 Base) MCG/ACT inhaler Commonly known as: VENTOLIN HFA Inhale 1 puff into the lungs every 6 (six) hours as needed for wheezing or shortness of breath.   amLODipine 10 MG tablet Commonly known as: NORVASC Take 1 tablet (10 mg total) by mouth daily.   ASCORBIC ACID PO Take by mouth.   cholecalciferol 25 MCG (1000 UT) tablet Commonly known as: VITAMIN D3 Take 1,000 Units by mouth daily.   fluticasone furoate-vilanterol 200-25 MCG/INH Aepb Commonly known  asAdair Patter Inhale 1 puff into the lungs daily.   furosemide 20 MG tablet Commonly known as: LASIX TAKE 1 TABLET BY MOUTH EVERY DAY   gabapentin 300 MG capsule Commonly known as: NEURONTIN Take 300 mg by mouth 3 (three) times daily.   insulin aspart protamine- aspart (70-30) 100 UNIT/ML injection Commonly known as: NOVOLOG MIX 70/30 Inject 35-40 Units  into the skin.   levothyroxine 50 MCG tablet Commonly known as: SYNTHROID Take 1 tablet (50 mcg total) by mouth daily before breakfast. ((for thyroid))   LORazepam 1 MG tablet Commonly known as: ATIVAN Take 1 tablet (1 mg total) by mouth at bedtime as needed for anxiety. For anxiety, Take 1 hour prior up coming head scan.   losartan 50 MG tablet Commonly known as: COZAAR Take 25 mg by mouth daily.   magnesium oxide 400 MG tablet Commonly known as: MAG-OX Take 400 mg by mouth 2 (two) times daily.   metFORMIN 1000 MG tablet Commonly known as: GLUCOPHAGE Take 1 tablet (1,000 mg total) by mouth 2 (two) times daily with a meal.   metoprolol 200 MG 24 hr tablet Commonly known as: TOPROL-XL Take 1 tablet (200 mg total) by mouth daily.   pantoprazole 40 MG tablet Commonly known as: PROTONIX Take 1 tablet (40 mg total) by mouth daily.   Potassium Chloride ER 20 MEQ Tbcr Take 20 mEq by mouth daily. Take only on days when taking lasix   rosuvastatin 20 MG tablet Commonly known as: CRESTOR Take 20 mg by mouth daily.   Semaglutide(0.25 or 0.5MG /DOS) 2 MG/1.5ML Sopn Commonly known as: Ozempic (0.25 or 0.5 MG/DOSE) Inject 0.5 mg into the skin once a week. What changed: how much to take   STIOLTO RESPIMAT IN Inhale 1 puff into the lungs daily as needed (shortness of breath).   triamcinolone cream 0.1 % Commonly known as: KENALOG Apply 1 application topically 2 (two) times daily.   Valtrex 1000 MG tablet Generic drug: valACYclovir Take 1,000 mg by mouth daily as needed (cold sores).   VITAMIN B-12 PO Take by mouth.       History (reviewed): Past Medical History:  Diagnosis Date  . Anemia   . Arthritis   . Atrial flutter ([place])    post op following lobectomy  . Benign essential tremor   . Blood transfusion without reported diagnosis   . Cataract   . CHF (congestive heart failure) ([place])   . Colon polyp    2007, polyp with early cancer, one year f/u no residual  polyp. next TCS 2010, see P[facility]. [facility].  [name] COPD (chronic obstructive pulmonary disease) ([place])   . Diabetes mellitus without complication ([place])    10 years  . GERD (gastroesophageal reflux disease)   . HOH (hard of hearing)   . Hypercholesteremia   . Hypertension    10 years  . Lung cancer ([place])   . Prostate cancer ([place])   . Shortness of breath dyspnea    due to lung mass  . Skin melanoma ([place])   . Sleep apnea    wears CPAP sometimes, cannot tolerate all the time.   Past Surgical History:  Procedure Laterality Date  . bottom lobe of right lung removed  12/17/14  . CARDIAC CATHETERIZATION     no PCI  . CATARACT EXTRACTION Left   . CATARACT EXTRACTION W/PHACO Left 01/03/2016   Procedure: CATARACT EXTRACTION PHACO AND INTRAOCULAR LENS PLACEMENT LEFT EYE CDE=7.78;  Surgeon: Williams Che, MD;  Location: [facility];  Service: Ophthalmology;  Laterality: Left;  . CATARACT EXTRACTION W/PHACO Right 03/20/2016   Procedure: CATARACT EXTRACTION PHACO AND INTRAOCULAR LENS PLACEMENT; CDE:  9.58;  Surgeon: Williams Che, MD;  Location: [facility];  Service: Ophthalmology;  Laterality: Right;  . COLONOSCOPY  08/2008   Dr. Leafy Half, [place]: normal, internal grade 1 hemorrhoids. next TCS 08/2013  . COLONOSCOPY N/A 03/04/2014   Procedure: COLONOSCOPY;  Surgeon: Daneil Dolin, MD;  [facility];  Service: Endoscopy;  Laterality: N/A;  7:30am  . EYE SURGERY    . knee replacements     bilateral. over 10 years ago  . LOBECTOMY Right 12/17/2014   Procedure: RIGHT LOWER LOBECTOMY;  Surgeon: Melrose Nakayama, MD;  Location: [place];  Service: Thoracic;  Laterality: Right;  . LYMPH NODE DISSECTION Right 12/17/2014   Procedure: LYMPH NODE DISSECTION;  Surgeon: Melrose Nakayama, MD;  Location: [place];  Service: Thoracic;  Laterality: Right;  . NECK SURGERY     melanoma removed.  [name] PROSTATE SURGERY     prostatectomy, radical  . TOTAL HIP ARTHROPLASTY  2007    left  . total shoulder replacement  2011   left  . VIDEO ASSISTED THORACOSCOPY (VATS)/WEDGE RESECTION Right 12/17/2014   Procedure: RIGHT VIDEO ASSISTED THORACOSCOPY (VATS);  Surgeon: Melrose Nakayama, MD;  Location: [place];  Service: Thoracic;  Laterality: Right;  [name] VIDEO BRONCHOSCOPY Bilateral 11/17/2016   Procedure: VIDEO BRONCHOSCOPY WITHOUT FLUORO;  Surgeon: Javier Glazier, MD;  Location: [facility];  Service: Cardiopulmonary;  Laterality: Bilateral;  . VIDEO BRONCHOSCOPY N/A 11/01/2017   Procedure: VIDEO BRONCHOSCOPY;  Surgeon: Melrose Nakayama, MD;  Location: [facility];  Service: Thoracic;  Laterality: N/A;   Family History  Problem Relation Age of Onset  . Cancer Mother        unknown type  . COPD Sister   . Breast cancer Sister   . Cancer Sister 65       breast  . Heart attack Daughter   . Prostate cancer Son   . Colon cancer Neg Hx    Social History   Socioeconomic History  . Marital status: Divorced    Spouse name: Not on file  . Number of children: 3  . Years of education: associates  . Highest education level: Associate degree: occupational, Hotel manager, or vocational program  Occupational History  . 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 Financial resource strain: Not hard at all  . Food insecurity    Worry: Never true    Inability: Never true  . Transportation needs    Medical: No    Non-medical: No  Tobacco Use  . Smoking status: Former Smoker    Packs/day: 2.00    Years: 32.00    Pack years: 64.00    Types: Cigarettes    Quit date: 07/03/1986    Years since quitting: 32.7  . Smokeless tobacco: Never Used  Substance and Sexual Activity  . Alcohol use: Yes    Alcohol/week: 0.0 standard drinks    Comment: rarely wine  . Drug use: No  . Sexual activity: Yes  Lifestyle  . Physical activity    Days per week: 0 days    Minutes per session: 0 min  . Stress: Not at all  Relationships  . Social connections    Talks on  phone: More than three times a week    Gets together:  More than three times a week    Attends religious service: Never    Active member of club or organization: Yes    Attends meetings of clubs or organizations: More than 4 times per year    Relationship status: Divorced  Other Topics Concern  . Not on file  Social History Narrative   [employer].  Lives alone.      [facility]:   Originally from [facility]. Served in [employer] with significant exposure to asbestos. 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 He has also lived in [place], [place], & [place]. He has lived in [place] since 1979. 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 No pets currently. Remote bird exposure.       Patient is divorced but has had a girlfriend for 7 months. He enjoys going dancing several times a week. He had 3 children but one passed away at 54 from prostate cancer. He has 17 grandchildren.     Activities of Daily Living In your present state of health, do you have any difficulty performing the following activities: 04/01/2019 03/10/2019  Hearing? Tempie Donning  Comment wears bilateral hearing aids -  Vision? N N  Comment wears glasses-[facility]  Difficulty concentrating or making decisions? N N  Walking or climbing stairs? N N  Dressing or bathing? N N  Doing errands, shopping? N N  Preparing Food and eating ? N -  Using the Toilet? N -  In the past six months, have you accidently leaked urine? N -  Do you have problems with loss of bowel control? N -  Managing your Medications? N -  Managing your Finances? N -  Housekeeping or managing your Housekeeping? N -  Some recent data might be hidden    Patient Education/ Literacy How often do you need to have someone help you when you read instructions, pamphlets, or other written materials from your doctor or pharmacy?: 1 - Never What is the last grade level you completed in school?: Associates Degree  Exercise Current Exercise Habits: The patient does not  participate in regular exercise at present, Exercise limited by: respiratory conditions(s);orthopedic condition(s)  Diet Patient reports consuming 2 meals a day and 2 snack(s) a day Patient reports that his primary diet is: Regular Patient reports that she does have regular access to food.   Depression Screen PHQ 2/9 Scores 04/01/2019 03/25/2019 08/26/2018 05/27/2018 05/14/2018 04/19/2018 04/03/2018  PHQ - 2 Score 4 1 1  0 0 2 3  PHQ- 9 Score 8 - - - - 7 19   Pt scored higher on the depression screening today but he said a lot of it had to do with COVID-19 pandemic and not being able to go out like he used to as well as his recent hospitalization and being weak and tired from that.   Fall Risk Fall Risk  04/01/2019 03/25/2019 05/27/2018 04/19/2018 04/03/2018  Falls in the past year? 0 0 0 No No  Number falls in past yr: 0 - - - -  Injury with Fall? 0 - - - -  Risk for fall due to : - - - - -  Follow up Falls prevention discussed - - - -  Comment Get rid of all throw rugs in the house, adequate lighting in the walkways and grab bars in the bathroom - - - -     Objective:  Timothy Guerrero seemed alert and oriented and he  participated appropriately during our telephone visit.  Blood Pressure Weight BMI  BP Readings from Last 3 Encounters:  03/25/19 132/73  03/18/19 (!) 145/75  03/11/19 128/72   Wt Readings from Last 3 Encounters:  03/25/19 189 lb 12.8 oz (86.1 kg)  03/18/19 190 lb 12.8 oz (86.5 kg)  03/10/19 197 lb 12 oz (89.7 kg)   BMI Readings from Last 1 Encounters:  03/25/19 25.74 kg/m    *Unable to obtain current vital signs, weight, and BMI due to telephone visit type  Hearing/Vision  . Sherrod did not seem to have difficulty with hearing/understanding during the telephone conversation . Reports that he has had a formal eye exam by an eye care professional within the past year . Reports that he has not had a formal hearing evaluation within the past year *Unable to fully  assess hearing and vision during telephone visit type  Cognitive Function: 6CIT Screen 04/01/2019  What Year? 0 points  What month? 0 points  What time? 0 points  Count back from 20 0 points  Months in reverse 0 points  Repeat phrase 2 points  Total Score 2   (Normal:0-7, Significant for Dysfunction: >8)  Normal Cognitive Function Screening: Yes   Immunization & Health Maintenance Record Immunization History  Administered Date(s) Administered  . Influenza, High Dose Seasonal PF 04/19/2016, 04/19/2018  . Influenza,inj,Quad PF,6+ Mos 03/31/2015  . Influenza,inj,quad, With Preservative 01/31/2017  . Pneumococcal Conjugate-13 07/30/2015  . Pneumococcal Polysaccharide-23 08/16/2017  . Tdap 04/24/2016    Health Maintenance  Topic Date Due  . OPHTHALMOLOGY EXAM  12/01/2016  . FOOT EXAM  07/25/2017  . HEMOGLOBIN A1C  02/24/2019  . INFLUENZA VACCINE  04/01/2020 (Originally 02/01/2019)  . TETANUS/TDAP  04/24/2026  . PNA vac Low Risk Adult  Completed       Assessment  This is a routine wellness examination for [employer].  Health Maintenance: Due or Overdue Health Maintenance Due  Topic Date Due  . OPHTHALMOLOGY EXAM  12/01/2016  . FOOT EXAM  07/25/2017  . HEMOGLOBIN A1C  02/24/2019    Timothy Guerrero does not need a referral for Community Assistance: Care Management:   no Social Work:    no Prescription Assistance:  no Nutrition/Diabetes Education:  no   Plan:  Personalized Goals Goals Addressed            This Visit's Progress   . DIET - INCREASE WATER INTAKE       Try to drink 6-8 glasses of water daily.      Personalized Health Maintenance & Screening Recommendations  Influenza vaccine Advanced directives: has an advanced directive - a copy HAS NOT been provided.  Lung Cancer Screening Recommended: no (Low Dose CT Chest recommended if Age 1-80 years, 30 pack-year currently smoking OR have quit w/in past 15 years) Hepatitis C Screening  recommended: no HIV Screening recommended: no  Advanced Directives: Written information was not prepared per patient's request.  Referrals & Orders No orders of the defined types were placed in this encounter.   Follow-up Plan . Follow-up with Dettinger, Timothy Kaufmann, MD as planned . Keep your Diabetic Eye Exam with the [facility] next week as scheduled . Get your Flu vaccine  . Bring a copy of your Advanced Directives in for our records   I have personally reviewed and noted the following in the patient's chart:   . Medical and social history . Use of alcohol, tobacco or illicit drugs  . Current medications and  supplements . Functional ability and status . Nutritional status . Physical activity . Advanced directives . List of other physicians . Hospitalizations, surgeries, and ER visits in previous 12 months . Vitals . Screenings to include cognitive, depression, and falls . Referrals and appointments  In addition, I have reviewed and discussed with Timothy Guerrero certain preventive protocols, quality metrics, and best practice recommendations. A written personalized care plan for preventive services as well as general preventive health recommendations is available and can be mailed to the patient at his request.      Milas Hock, LPN  7/86/[id number]

## 2019-04-01 NOTE — Patient Instructions (Signed)
Preventive Care 78 Years and Older, Male Preventive care refers to lifestyle choices and visits with your health care provider that can promote health and wellness. This includes:  A yearly physical exam. This is also called an annual well check.  Regular dental and eye exams.  Immunizations.  Screening for certain conditions.  Healthy lifestyle choices, such as diet and exercise. What can I expect for my preventive care visit? Physical exam Your health care provider will check:  Height and weight. These may be used to calculate body mass index (BMI), which is a measurement that tells if you are at a healthy weight.  Heart rate and blood pressure.  Your skin for abnormal spots. Counseling Your health care provider may ask you questions about:  Alcohol, tobacco, and drug use.  Emotional well-being.  Home and relationship well-being.  Sexual activity.  Eating habits.  History of falls.  Memory and ability to understand (cognition).  Work and work Statistician. What immunizations do I need?  Influenza (flu) vaccine  This is recommended every year. Tetanus, diphtheria, and pertussis (Tdap) vaccine  You may need a Td booster every 10 years. Varicella (chickenpox) vaccine  You may need this vaccine if you have not already been vaccinated. Zoster (shingles) vaccine  You may need this after age 78. Pneumococcal conjugate (PCV13) vaccine  One dose is recommended after age 78. Pneumococcal polysaccharide (PPSV23) vaccine  One dose is recommended after age 78. Measles, mumps, and rubella (MMR) vaccine  You may need at least one dose of MMR if you were born in 1957 or later. You may also need a second dose. Meningococcal conjugate (MenACWY) vaccine  You may need this if you have certain conditions. Hepatitis A vaccine  You may need this if you have certain conditions or if you travel or work in places where you may be exposed to hepatitis A. Hepatitis B vaccine   You may need this if you have certain conditions or if you travel or work in places where you may be exposed to hepatitis B. Haemophilus influenzae type b (Hib) vaccine  You may need this if you have certain conditions. You may receive vaccines as individual doses or as more than one vaccine together in one shot (combination vaccines). Talk with your health care provider about the risks and benefits of combination vaccines. What tests do I need? Blood tests  Lipid and cholesterol levels. These may be checked every 5 years, or more frequently depending on your overall health.  Hepatitis C test.  Hepatitis B test. Screening  Lung cancer screening. You may have this screening every year starting at age 78 if you have a 30-pack-year history of smoking and currently smoke or have quit within the past 15 years.  Colorectal cancer screening. All adults should have this screening starting at age 78 and continuing until age 77. Your health care provider may recommend screening at age 35 if you are at increased risk. You will have tests every 1-10 years, depending on your results and the type of screening test.  Prostate cancer screening. Recommendations will vary depending on your family history and other risks.  Diabetes screening. This is done by checking your blood sugar (glucose) after you have not eaten for a while (fasting). You may have this done every 1-3 years.  Abdominal aortic aneurysm (AAA) screening. You may need this if you are a current or former smoker.  Sexually transmitted disease (STD) testing. Follow these instructions at home: Eating and drinking  Eat  a diet that includes fresh fruits and vegetables, whole grains, lean protein, and low-fat dairy products. Limit your intake of foods with high amounts of sugar, saturated fats, and salt.  Take vitamin and mineral supplements as recommended by your health care provider.  Do not drink alcohol if your health care provider  tells you not to drink.  If you drink alcohol: ? Limit how much you have to 0-2 drinks a day. ? Be aware of how much alcohol is in your drink. In the [place] one drink equals one 12 oz bottle of beer (355 mL), one 5 oz glass of wine (148 mL), or one 1 oz glass of hard liquor (44 mL). Lifestyle  Take daily care of your teeth and gums.  Stay active. Exercise for at least 30 minutes on 5 or more days each week.  Do not use any products that contain nicotine or tobacco, such as cigarettes, e-cigarettes, and chewing tobacco. If you need help quitting, ask your health care provider.  If you are sexually active, practice safe sex. Use a condom or other form of protection to prevent STIs (sexually transmitted infections).  Talk with your health care provider about taking a low-dose aspirin or statin. What's next?  Visit your health care provider once a year for a well check visit.  Ask your health care provider how often you should have your eyes and teeth checked.  Stay up to date on all vaccines. This information is not intended to replace advice given to you by your health care provider. Make sure you discuss any questions you have with your health care provider. Document Released: 07/16/2015 Document Revised: 06/13/2018 Document Reviewed: 06/13/2018 Elsevier Patient Education  2020 [employer].

## 2019-04-02 ENCOUNTER — Ambulatory Visit: Payer: Medicare HMO | Admitting: Physician Assistant

## 2019-04-04 ENCOUNTER — Ambulatory Visit (INDEPENDENT_AMBULATORY_CARE_PROVIDER_SITE_OTHER): Payer: Medicare HMO | Admitting: Family Medicine

## 2019-04-04 ENCOUNTER — Encounter: Payer: Self-pay | Admitting: Family Medicine

## 2019-04-04 DIAGNOSIS — J984 Other disorders of lung: Secondary | ICD-10-CM | POA: Diagnosis not present

## 2019-04-04 DIAGNOSIS — E1159 Type 2 diabetes mellitus with other circulatory complications: Secondary | ICD-10-CM | POA: Diagnosis not present

## 2019-04-04 DIAGNOSIS — J449 Chronic obstructive pulmonary disease, unspecified: Secondary | ICD-10-CM

## 2019-04-04 NOTE — Progress Notes (Signed)
Virtual Visit via telephone Note  I connected with Timothy Guerrero on 04/04/19 at 1258 by telephone and verified that I am speaking with the correct person using two identifiers. Timothy Guerrero is currently located at home and no other people are currently with her during visit. The provider, Fransisca Kaufmann Nilsa Macht, MD is located in their office at time of visit.  Call ended at 1315  I discussed the limitations, risks, security and privacy concerns of performing an evaluation and management service by telephone and the availability of in person appointments. I also discussed with the patient that there may be a patient responsible charge related to this service. The patient expressed understanding and agreed to proceed.   History and Present Illness: Patient is calling in to get disability from the [place] and that he can't work because of his disability that he can draw unemployment. 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 Patient has hearing aids and can only hear 50% out of both ears with the hearing aids.  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 Patient has been diagnosed with COPD and when he walks to mailbox he gets shortness of breath and when going up even one flight of stairs he gets short of breath. Patient has a history of prostate cancer 19 years.    No diagnosis found.  Outpatient Encounter Medications as of 04/04/2019  Medication Sig  . acetaminophen (TYLENOL) 325 MG tablet Take 2 tablets (650 mg total) by mouth every 6 (six) hours as needed for mild pain, fever or headache (or Fever >/= 101).  [name] albuterol (PROVENTIL HFA;VENTOLIN HFA) 108 (90 Base) MCG/ACT inhaler Inhale 1 puff into the lungs every 6 (six) hours as needed for wheezing or shortness of breath.  [name] amLODipine (NORVASC) 10 MG tablet Take 1 tablet (10 mg total) by mouth daily.  . ASCORBIC ACID PO Take  by mouth.  . cholecalciferol (VITAMIN D3) 25 MCG (1000 UT) tablet Take 1,000 Units by mouth daily.  . Cyanocobalamin (VITAMIN B-12 PO) Take by mouth.  . fluticasone furoate-vilanterol (BREO ELLIPTA) 200-25 MCG/INH AEPB Inhale 1 puff into the lungs daily.  . furosemide (LASIX) 20 MG tablet TAKE 1 TABLET BY MOUTH EVERY DAY  . gabapentin (NEURONTIN) 300 MG capsule Take 300 mg by mouth 3 (three) times daily.  . insulin aspart protamine- aspart (NOVOLOG MIX 70/30) (70-30) 100 UNIT/ML injection Inject 35-40 Units into the skin.  [name] levothyroxine (SYNTHROID, LEVOTHROID) 50 MCG tablet Take 1 tablet (50 mcg total) by mouth daily before breakfast. ((for thyroid))  . LORazepam (ATIVAN) 1 MG tablet Take 1 tablet (1 mg total) by mouth at bedtime as needed for anxiety. For anxiety, Take 1 hour prior up coming head scan.  . losartan (COZAAR) 50 MG tablet Take 25 mg by mouth daily.  . magnesium oxide (MAG-OX) 400 MG tablet Take 400 mg by mouth 2 (two) times daily.  . metFORMIN (GLUCOPHAGE) 1000 MG tablet Take 1 tablet (1,000 mg total) by mouth 2 (two) times daily with a meal.  . metoprolol (TOPROL-XL) 200 MG 24 hr tablet Take 1 tablet (200 mg total) by mouth daily.  . pantoprazole (PROTONIX) 40 MG tablet Take 1 tablet (40 mg total) by mouth daily.  . Potassium Chloride ER 20 MEQ TBCR Take 20 mEq by mouth daily. Take only on days when taking lasix  .  rosuvastatin (CRESTOR) 20 MG tablet Take 20 mg by mouth daily.  . Semaglutide (OZEMPIC) 0.25 or 0.5 MG/DOSE SOPN Inject 0.5 mg into the skin once a week. (Patient taking differently: Inject 1 mg into the skin once a week. )  . Tiotropium Bromide-Olodaterol (STIOLTO RESPIMAT IN) Inhale 1 puff into the lungs daily as needed (shortness of breath).  . triamcinolone cream (KENALOG) 0.1 % Apply 1 application topically 2 (two) times daily.  . valACYclovir (VALTREX) 1000 MG tablet Take 1,000 mg by mouth daily as needed (cold sores).    No facility-administered encounter  medications on file as of 04/04/2019.     Review of Systems  Constitutional: Negative for chills and fever.  HENT: Negative for congestion.   Respiratory: Positive for shortness of breath. Negative for cough and wheezing.   Cardiovascular: Negative for chest pain, palpitations and leg swelling.  Musculoskeletal: Negative for back pain and gait problem.  Skin: Negative for rash.  Neurological: Negative for dizziness, weakness and numbness.  All other systems reviewed and are negative.   Observations/Objective: Patient sounds comfortable and in no acute distress  Assessment and Plan: Problem List Items Addressed This Visit      Respiratory   COPD, moderate ([place])   Restrictive lung disease - Primary     Endocrine   Diabetes mellitus ([place])       Follow Up Instructions: Patient is totally disabled because of lunge disease and copd and h/o lobectomy  55-month follow-up for usual appointment    I discussed the assessment and treatment plan with the patient. The patient was provided an opportunity to ask questions and all were answered. The patient agreed with the plan and demonstrated an understanding of the instructions.   The patient was advised to call back or seek an in-person evaluation if the symptoms worsen or if the condition fails to improve as anticipated.  The above assessment and management plan was discussed with the patient. The patient verbalized understanding of and has agreed to the management plan. Patient is aware to call the clinic if symptoms persist or worsen. Patient is aware when to return to the clinic for a follow-up visit. Patient educated on when it is appropriate to go to the emergency department.    I provided 17 minutes of non-face-to-face time during this encounter.    Worthy Rancher, MD

## 2019-04-10 ENCOUNTER — Other Ambulatory Visit (HOSPITAL_COMMUNITY): Payer: Self-pay | Admitting: *Deleted

## 2019-04-10 ENCOUNTER — Other Ambulatory Visit: Payer: Self-pay | Admitting: Family Medicine

## 2019-04-10 DIAGNOSIS — D508 Other iron deficiency anemias: Secondary | ICD-10-CM

## 2019-04-10 DIAGNOSIS — D500 Iron deficiency anemia secondary to blood loss (chronic): Secondary | ICD-10-CM

## 2019-04-10 DIAGNOSIS — D61818 Other pancytopenia: Secondary | ICD-10-CM

## 2019-04-10 DIAGNOSIS — D649 Anemia, unspecified: Secondary | ICD-10-CM

## 2019-04-10 DIAGNOSIS — D696 Thrombocytopenia, unspecified: Secondary | ICD-10-CM

## 2019-04-11 ENCOUNTER — Other Ambulatory Visit: Payer: Self-pay

## 2019-04-11 ENCOUNTER — Inpatient Hospital Stay (HOSPITAL_COMMUNITY): Payer: Medicare HMO | Attending: Hematology

## 2019-04-11 DIAGNOSIS — Z803 Family history of malignant neoplasm of breast: Secondary | ICD-10-CM | POA: Insufficient documentation

## 2019-04-11 DIAGNOSIS — D649 Anemia, unspecified: Secondary | ICD-10-CM

## 2019-04-11 DIAGNOSIS — Z87891 Personal history of nicotine dependence: Secondary | ICD-10-CM | POA: Insufficient documentation

## 2019-04-11 DIAGNOSIS — Z794 Long term (current) use of insulin: Secondary | ICD-10-CM | POA: Insufficient documentation

## 2019-04-11 DIAGNOSIS — D696 Thrombocytopenia, unspecified: Secondary | ICD-10-CM

## 2019-04-11 DIAGNOSIS — I10 Essential (primary) hypertension: Secondary | ICD-10-CM | POA: Insufficient documentation

## 2019-04-11 DIAGNOSIS — Z8546 Personal history of malignant neoplasm of prostate: Secondary | ICD-10-CM | POA: Insufficient documentation

## 2019-04-11 DIAGNOSIS — D61818 Other pancytopenia: Secondary | ICD-10-CM | POA: Insufficient documentation

## 2019-04-11 DIAGNOSIS — J449 Chronic obstructive pulmonary disease, unspecified: Secondary | ICD-10-CM | POA: Insufficient documentation

## 2019-04-11 DIAGNOSIS — D500 Iron deficiency anemia secondary to blood loss (chronic): Secondary | ICD-10-CM

## 2019-04-11 DIAGNOSIS — E119 Type 2 diabetes mellitus without complications: Secondary | ICD-10-CM | POA: Insufficient documentation

## 2019-04-11 DIAGNOSIS — Z79899 Other long term (current) drug therapy: Secondary | ICD-10-CM | POA: Diagnosis not present

## 2019-04-11 DIAGNOSIS — Z7984 Long term (current) use of oral hypoglycemic drugs: Secondary | ICD-10-CM | POA: Insufficient documentation

## 2019-04-11 DIAGNOSIS — I4892 Unspecified atrial flutter: Secondary | ICD-10-CM | POA: Insufficient documentation

## 2019-04-11 DIAGNOSIS — Z8042 Family history of malignant neoplasm of prostate: Secondary | ICD-10-CM | POA: Insufficient documentation

## 2019-04-11 DIAGNOSIS — D508 Other iron deficiency anemias: Secondary | ICD-10-CM

## 2019-04-11 DIAGNOSIS — C3431 Malignant neoplasm of lower lobe, right bronchus or lung: Secondary | ICD-10-CM | POA: Insufficient documentation

## 2019-04-11 LAB — CBC WITH DIFFERENTIAL/PLATELET
Abs Immature Granulocytes: 0.02 10*3/uL (ref 0.00–0.07)
Basophils Absolute: 0 10*3/uL (ref 0.0–0.1)
Basophils Relative: 0 %
Eosinophils Absolute: 0.3 10*3/uL (ref 0.0–0.5)
Eosinophils Relative: 4 %
HCT: 42.1 % (ref 39.0–52.0)
Hemoglobin: 12.6 g/dL — ABNORMAL LOW (ref 13.0–17.0)
Immature Granulocytes: 0 %
Lymphocytes Relative: 23 %
Lymphs Abs: 1.5 10*3/uL (ref 0.7–4.0)
MCH: 25.7 pg — ABNORMAL LOW (ref 26.0–34.0)
MCHC: 29.9 g/dL — ABNORMAL LOW (ref 30.0–36.0)
MCV: 85.9 fL (ref 80.0–100.0)
Monocytes Absolute: 0.8 10*3/uL (ref 0.1–1.0)
Monocytes Relative: 13 %
Neutro Abs: 3.9 10*3/uL (ref 1.7–7.7)
Neutrophils Relative %: 60 %
Platelets: 298 10*3/uL (ref 150–400)
RBC: 4.9 MIL/uL (ref 4.22–5.81)
RDW: 15.4 % (ref 11.5–15.5)
WBC: 6.6 10*3/uL (ref 4.0–10.5)
nRBC: 0 % (ref 0.0–0.2)

## 2019-04-11 LAB — COMPREHENSIVE METABOLIC PANEL
ALT: 12 U/L (ref 0–44)
AST: 16 U/L (ref 15–41)
Albumin: 3.9 g/dL (ref 3.5–5.0)
Alkaline Phosphatase: 70 U/L (ref 38–126)
Anion gap: 8 (ref 5–15)
BUN: 19 mg/dL (ref 8–23)
CO2: 26 mmol/L (ref 22–32)
Calcium: 9.1 mg/dL (ref 8.9–10.3)
Chloride: 107 mmol/L (ref 98–111)
Creatinine, Ser: 0.94 mg/dL (ref 0.61–1.24)
GFR calc Af Amer: >60 mL/min (ref >60)
GFR calc non Af Amer: >60 mL/min (ref >60)
Glucose, Bld: 71 mg/dL (ref 70–99)
Potassium: 4 mmol/L (ref 3.5–5.1)
Sodium: 141 mmol/L (ref 135–145)
Total Bilirubin: 0.6 mg/dL (ref 0.3–1.2)
Total Protein: 6.9 g/dL (ref 6.5–8.1)

## 2019-04-11 LAB — LACTATE DEHYDROGENASE: LDH: 138 U/L (ref 98–192)

## 2019-04-11 LAB — MAGNESIUM: Magnesium: 2 mg/dL (ref 1.7–2.4)

## 2019-04-14 ENCOUNTER — Telehealth: Payer: Self-pay | Admitting: Family Medicine

## 2019-04-14 NOTE — Telephone Encounter (Signed)
Pt aware - called and aware of his dose was affected - the pharm should have contacted him, he is aware to call his pharm if he is still concerned.

## 2019-04-16 ENCOUNTER — Other Ambulatory Visit: Payer: Self-pay

## 2019-04-16 ENCOUNTER — Inpatient Hospital Stay (HOSPITAL_BASED_OUTPATIENT_CLINIC_OR_DEPARTMENT_OTHER): Payer: Medicare HMO | Admitting: Hematology

## 2019-04-16 ENCOUNTER — Encounter (HOSPITAL_COMMUNITY): Payer: Self-pay | Admitting: Hematology

## 2019-04-16 VITALS — BP 161/83 | HR 97 | Temp 97.3°F | Resp 18 | Wt 190.0 lb

## 2019-04-16 DIAGNOSIS — Z79899 Other long term (current) drug therapy: Secondary | ICD-10-CM | POA: Diagnosis not present

## 2019-04-16 DIAGNOSIS — D61818 Other pancytopenia: Secondary | ICD-10-CM

## 2019-04-16 DIAGNOSIS — I4892 Unspecified atrial flutter: Secondary | ICD-10-CM | POA: Diagnosis not present

## 2019-04-16 DIAGNOSIS — C3431 Malignant neoplasm of lower lobe, right bronchus or lung: Secondary | ICD-10-CM | POA: Diagnosis not present

## 2019-04-16 DIAGNOSIS — Z794 Long term (current) use of insulin: Secondary | ICD-10-CM | POA: Diagnosis not present

## 2019-04-16 DIAGNOSIS — E119 Type 2 diabetes mellitus without complications: Secondary | ICD-10-CM | POA: Diagnosis not present

## 2019-04-16 DIAGNOSIS — I10 Essential (primary) hypertension: Secondary | ICD-10-CM | POA: Diagnosis not present

## 2019-04-16 DIAGNOSIS — Z87891 Personal history of nicotine dependence: Secondary | ICD-10-CM | POA: Diagnosis not present

## 2019-04-16 DIAGNOSIS — J449 Chronic obstructive pulmonary disease, unspecified: Secondary | ICD-10-CM | POA: Diagnosis not present

## 2019-04-16 DIAGNOSIS — Z8546 Personal history of malignant neoplasm of prostate: Secondary | ICD-10-CM | POA: Diagnosis not present

## 2019-04-16 NOTE — Assessment & Plan Note (Signed)
1.  Pancytopenia: - Admitted to the hospital from 03/09/2019 through 03/11/2019 with 4-week history of intermittent fevers. - Extensive work-up for nutritional deficiencies was negative.  Work-up for infectious causes including parvo L73, ehrlichiosis, [facility] spotted fever, Lyme disease, hepatitis, HIV were negative.  ANA was also negative. - He denies any fevers, night sweats or weight loss since discharge from the hospital. -He reported blood transfusion in November 2017 when his hemoglobin dropped to 6.6. - He still reports some lack of energy although it improved since discharge from the hospital. -We reviewed his blood work which have almost normalized.  Hence no bone marrow biopsy is needed. -We will reevaluate him in 4 months with repeat blood counts.  2.  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 -Bronchial washings for hemoptysis on 11/01/2017 shows malignant cells consistent with non-small cell carcinoma. 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

## 2019-04-16 NOTE — Progress Notes (Signed)
[facility] [place], [place] [address]   CLINIC:  Medical Oncology/Hematology  PCP:  Dettinger, Fransisca Kaufmann, MD [address] [place] [address] [phone]   REASON FOR VISIT:  Follow-up for pancytopenia.  CURRENT THERAPY: Observation.  BRIEF ONCOLOGIC HISTORY:  Oncology History  Squamous cell lung cancer ([place])  03/09/2015 Initial Diagnosis   Squamous cell lung cancer ([place])   12/21/2015 Imaging   No evidence of local tumor recurrence in the right lung. No definite findings of metastatic disease in the chest. Solitary subpleural 3 mm left lower lobe pulmonary nodule is probably benign, however was not definitely seen on 10/25/2010 chest CT.       CANCER STAGING: Cancer Staging Squamous cell lung cancer ([place]) Staging form: Lung, AJCC 7th Edition - Clinical stage from 03/09/2015: T2, N0, M0 - Unsigned - Pathologic stage from 03/09/2015: Stage IA (T1b, N0, cM0) - Unsigned    INTERVAL HISTORY:  Mr. Hadi Dubin 78 y.o. male seen for follow-up of pancytopenia after recent hospitalization in September of this year with 4-week history of intermittent fevers.  His appetite is 75% and energy levels are 50%.  He denies any fevers, night sweats or weight loss in the last 4 to 6 weeks.  Has occasional diarrhea which is stable.  Numbness in the feet is also stable.  No pains reported.  Denies any tick bites.    REVIEW OF SYSTEMS:  Review of Systems  Gastrointestinal: Positive for diarrhea.  Neurological: Positive for numbness.  All other systems reviewed and are negative.    PAST MEDICAL/SURGICAL HISTORY:  Past Medical History:  Diagnosis Date  . Anemia   . Arthritis   . Atrial flutter ([place])    post op following lobectomy  . Benign essential tremor   . Blood transfusion without reported diagnosis   . Cataract   . CHF (congestive heart failure) ([place])   . Colon polyp    2007, polyp with early cancer, one year f/u no residual polyp. next TCS 2010, see  PSH. [facility].  [name] COPD (chronic obstructive pulmonary disease) ([place])   . Diabetes mellitus without complication ([place])    10 years  . GERD (gastroesophageal reflux disease)   . HOH (hard of hearing)   . Hypercholesteremia   . Hypertension    10 years  . Lung cancer ([place])   . Prostate cancer ([place])   . Shortness of breath dyspnea    due to lung mass  . Skin melanoma ([employer])   . Sleep apnea    wears CPAP sometimes, cannot tolerate all the time.   Past Surgical History:  Procedure Laterality Date  . bottom lobe of right lung removed  12/17/14  . CARDIAC CATHETERIZATION     no PCI  . CATARACT EXTRACTION Left   . CATARACT EXTRACTION W/PHACO Left 01/03/2016   Procedure: CATARACT EXTRACTION PHACO AND INTRAOCULAR LENS PLACEMENT LEFT EYE CDE=7.78;  Surgeon: Williams Che, MD;  Location: [facility];  Service: Ophthalmology;  Laterality: Left;  . CATARACT EXTRACTION W/PHACO Right 03/20/2016   Procedure: CATARACT EXTRACTION PHACO AND INTRAOCULAR LENS PLACEMENT; CDE:  9.58;  Surgeon: Williams Che, MD;  Location: [facility];  Service: Ophthalmology;  Laterality: Right;  . COLONOSCOPY  08/2008   Dr. Leafy Half, [place]: normal, internal grade 1 hemorrhoids. next TCS 08/2013  . COLONOSCOPY N/A 03/04/2014   Procedure: COLONOSCOPY;  Surgeon: Daneil Dolin, MD;  Location: [facility];  Service: Endoscopy;  Laterality: N/A;  7:30am  . EYE SURGERY    . knee replacements     bilateral. over 10 years ago  . LOBECTOMY Right 12/17/2014   Procedure: RIGHT LOWER LOBECTOMY;  Surgeon: Melrose Nakayama, MD;  Location: [place] [facility];  Service: Thoracic;  Laterality: Right;  . LYMPH NODE DISSECTION Right 12/17/2014   Procedure: LYMPH NODE DISSECTION;  Surgeon: Melrose Nakayama, MD;  Location: [facility];  Service: Thoracic;  Laterality: Right;  . NECK SURGERY     melanoma removed.  [name] PROSTATE SURGERY     prostatectomy, radical  . TOTAL HIP ARTHROPLASTY  2007   left  . total shoulder  replacement  2011   left  . VIDEO ASSISTED THORACOSCOPY (VATS)/WEDGE RESECTION Right 12/17/2014   Procedure: RIGHT VIDEO ASSISTED THORACOSCOPY (VATS);  Surgeon: Melrose Nakayama, MD;  Location: [facility];  Service: Thoracic;  Laterality: Right;  [name] VIDEO BRONCHOSCOPY Bilateral 11/17/2016   Procedure: VIDEO BRONCHOSCOPY WITHOUT FLUORO;  Surgeon: Javier Glazier, MD;  Location: [facility];  Service: Cardiopulmonary;  Laterality: Bilateral;  . VIDEO BRONCHOSCOPY N/A 11/01/2017   Procedure: VIDEO BRONCHOSCOPY;  Surgeon: Melrose Nakayama, MD;  Location: [facility];  Service: Thoracic;  Laterality: N/A;     SOCIAL HISTORY:  Social History   Socioeconomic History  . Marital status: Divorced    Spouse name: Not on file  . Number of children: 3  . Years of education: associates  . Highest education level: Associate degree: occupational, Hotel manager, or vocational program  Occupational History  . 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 Financial resource strain: Not hard at all  . Food insecurity    Worry: Never true    Inability: Never true  . Transportation needs    Medical: No    Non-medical: No  Tobacco Use  . Smoking status: Former Smoker    Packs/day: 2.00    Years: 32.00    Pack years: 64.00    Types: Cigarettes    Quit date: 07/03/1986    Years since quitting: 32.8  . Smokeless tobacco: Never Used  Substance and Sexual Activity  . Alcohol use: Yes    Alcohol/week: 0.0 standard drinks    Comment: rarely wine  . Drug use: No  . Sexual activity: Yes  Lifestyle  . Physical activity    Days per week: 0 days    Minutes per session: 0 min  . Stress: Not at all  Relationships  . Social connections    Talks on phone: More than three times a week    Gets together: More than three times a week    Attends religious service: Never    Active member of club or organization: Yes    Attends meetings of clubs or organizations: More than 4 times per year     Relationship status: Divorced  . Intimate partner violence    Fear of current or ex partner: No    Emotionally abused: No    Physically abused: No    Forced sexual activity: No  Other Topics Concern  . Not on file  Social History Narrative   [employer].  Lives alone.      [facility]:   Originally from [place]. Served in [employer] with significant exposure to asbestos. 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 He has also lived in [place], [place], & [place]. He has lived in [place] since 1979. 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 No pets currently. Remote bird exposure.       Patient is divorced but has had a girlfriend for 7 months. He enjoys going dancing several times a week. He had 3 children but one passed away at 44 from prostate cancer. He has 17 grandchildren.     FAMILY HISTORY:  Family History  Problem Relation Age of Onset  . Cancer Mother        unknown type  . COPD Sister   . Breast cancer Sister   . Cancer Sister 59       breast  . Heart attack Daughter   . Prostate cancer Son   . Colon cancer Neg Hx     CURRENT MEDICATIONS:  Outpatient Encounter Medications as of 04/16/2019  Medication Sig  . amLODipine (NORVASC) 10 MG tablet Take 1 tablet (10 mg total) by mouth daily.  . ASCORBIC ACID PO Take by mouth.  . cholecalciferol (VITAMIN D3) 25 MCG (1000 UT) tablet Take 1,000 Units by mouth daily.  . Cyanocobalamin (VITAMIN B-12 PO) Take by mouth.  . fluticasone furoate-vilanterol (BREO ELLIPTA) 200-25 MCG/INH AEPB Inhale 1 puff into the lungs daily.  . furosemide (LASIX) 20 MG tablet TAKE 1 TABLET BY MOUTH EVERY DAY  . gabapentin (NEURONTIN) 300 MG capsule Take 300 mg by mouth 3 (three) times daily.  . insulin aspart protamine- aspart (NOVOLOG MIX 70/30) (70-30) 100 UNIT/ML injection Inject 35-40 Units into the skin.  [name] losartan (COZAAR) 50 MG tablet Take 25 mg by mouth daily.  . magnesium oxide (MAG-OX) 400 MG tablet Take 400 mg by mouth 2 (two) times daily.  . metFORMIN  (GLUCOPHAGE) 1000 MG tablet Take 1 tablet (1,000 mg total) by mouth 2 (two) times daily with a meal.  . metoprolol (TOPROL-XL) 200 MG 24 hr tablet Take 1 tablet (200 mg total) by mouth daily.  . pantoprazole (PROTONIX) 40 MG tablet Take 1 tablet (40 mg total) by mouth daily.  . Potassium Chloride ER 20 MEQ TBCR Take 20 mEq by mouth daily. Take only on days when taking lasix  . rosuvastatin (CRESTOR) 20 MG tablet Take 20 mg by mouth daily.  . Semaglutide (OZEMPIC) 0.25 or 0.5 MG/DOSE SOPN Inject 0.5 mg into the skin once a week. (Patient taking differently: Inject 1 mg into the skin once a week. )  . Tiotropium Bromide-Olodaterol (STIOLTO RESPIMAT IN) Inhale 1 puff into the lungs daily as needed (shortness of breath).  [name] acetaminophen (TYLENOL) 325 MG tablet Take 2 tablets (650 mg total) by mouth every 6 (six) hours as needed for mild pain, fever or headache (or Fever >/= 101). (Patient not taking: Reported on 04/16/2019)  . albuterol (PROVENTIL HFA;VENTOLIN HFA) 108 (90 Base) MCG/ACT inhaler Inhale 1 puff into the lungs every 6 (six) hours as needed for wheezing or shortness of breath.  . levothyroxine (SYNTHROID, LEVOTHROID) 50 MCG tablet Take 1 tablet (50 mcg total) by mouth daily before breakfast. ((for thyroid))  . triamcinolone cream (KENALOG) 0.1 % Apply 1 application topically 2 (two) times daily. (Patient not taking: Reported on 04/16/2019)  . valACYclovir (VALTREX) 1000 MG tablet Take 1,000 mg by mouth daily as needed (cold sores).   . [DISCONTINUED] LORazepam (ATIVAN) 1 MG tablet Take 1 tablet (1 mg total) by mouth at bedtime as needed for anxiety. For anxiety, Take 1 hour prior up coming head scan.   No facility-administered encounter medications on file as of 04/16/2019.     ALLERGIES:  No Known Allergies  PHYSICAL EXAM:  ECOG Performance status: 1  Vitals:   04/16/19 1150  BP: (!) 161/83  Pulse: 97  Resp: 18  Temp: (!) 97.3 F (36.3 C)  SpO2: 99%   Filed Weights    04/16/19 1150  Weight: 190 lb (86.2 kg)    Physical Exam Vitals signs reviewed.  Constitutional:      Appearance: Normal appearance.  Cardiovascular:     Rate and Rhythm: Normal rate and regular rhythm.     Heart sounds: Normal heart sounds.  Pulmonary:     Effort: Pulmonary effort is normal.     Breath sounds: Normal breath sounds.  Abdominal:     General: There is no distension.     Palpations: Abdomen is soft. There is no mass.  Musculoskeletal:        General: No swelling.  Skin:    General: Skin is warm.  Neurological:     General: No focal deficit present.     Mental Status: He is alert and oriented to person, place, and time.  Psychiatric:        Mood and Affect: Mood normal.        Behavior: Behavior normal.      LABORATORY DATA:  I have reviewed the labs as listed.  CBC    Component Value Date/Time   WBC 6.6 04/11/2019 1219   RBC 4.90 04/11/2019 1219   HGB 12.6 (L) 04/11/2019 1219   HGB 12.3 (L) 03/25/2019 1304   HCT 42.1 04/11/2019 1219   HCT 39.2 03/25/2019 1304   PLT 298 04/11/2019 1219   PLT 261 03/25/2019 1304   MCV 85.9 04/11/2019 1219   MCV 84 03/25/2019 1304   MCH 25.7 (L) 04/11/2019 1219   MCHC 29.9 (L) 04/11/2019 1219   RDW 15.4 04/11/2019 1219   RDW 16.7 (H) 03/25/2019 1304   LYMPHSABS 1.5 04/11/2019 1219   LYMPHSABS 1.4 03/25/2019 1304   MONOABS 0.8 04/11/2019 1219   EOSABS 0.3 04/11/2019 1219   EOSABS 0.0 03/25/2019 1304   BASOSABS 0.0 04/11/2019 1219   BASOSABS 0.0 03/25/2019 1304   CMP Latest Ref Rng & Units 04/11/2019 03/18/2019 03/11/2019  Glucose 70 - 99 mg/dL 71 210(H) 493(H)  BUN 8 - 23 mg/dL 19 15 -  Creatinine 0.61 - 1.24 mg/dL 0.94 0.82 -  Sodium 135 - 145 mmol/L 141 139 -  Potassium 3.5 - 5.1 mmol/L 4.0 4.7 -  Chloride 98 - 111 mmol/L 107 103 -  CO2 22 - 32 mmol/L 26 27 -  Calcium 8.9 - 10.3 mg/dL 9.1 9.0 -  Total Protein 6.5 - 8.1 g/dL 6.9 7.0 -  Total Bilirubin 0.3 - 1.2 mg/dL 0.6 0.6 -  Alkaline Phos 38 - 126 U/L 70  74 -  AST 15 - 41 U/L 16 18 -  ALT 0 - 44 U/L 12 23 -       DIAGNOSTIC IMAGING:  I have independently reviewed the scans and discussed with the patient.      ASSESSMENT & PLAN:   Pancytopenia ([place]) 1.  Pancytopenia: - Admitted to the hospital from 03/09/2019 through 03/11/2019 with 4-week history of intermittent fevers. - Extensive work-up for nutritional deficiencies was negative.  Work-up for infectious causes including parvo Z32, ehrlichiosis, [facility] spotted fever, Lyme disease, hepatitis, HIV were negative.  ANA was also negative. - He denies any fevers, night sweats or weight loss since discharge from the hospital. -He reported blood transfusion in November 2017 when his hemoglobin dropped to 6.6. -  He still reports some lack of energy although it improved since discharge from the hospital. -We reviewed his blood work which have almost normalized.  Hence no bone marrow biopsy is needed. -We will reevaluate him in 4 months with repeat blood counts.  2.  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 -Bronchial washings for hemoptysis on 11/01/2017 shows malignant cells consistent with non-small cell carcinoma. 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      Orders placed this encounter:  Orders Placed This Encounter  Procedures  . CBC with Differential/Platelet  . Comprehensive metabolic panel  . Lactate dehydrogenase      Derek Jack, MD [place] [phone]

## 2019-04-16 NOTE — Patient Instructions (Addendum)
[facility] at [facility] Discharge Instructions  You were seen today by Dr. Katragadda. He went over your recent lab results. He will see you back in 4 months for labs and follow up.   Thank you for choosing [facility] at [facility] to provide your oncology and hematology care.  To afford each patient quality time with our provider, please arrive at least 15 minutes before your scheduled appointment time.   If you have a lab appointment with the Cancer Center please come in thru the  Main Entrance and check in at the main information desk  You need to re-schedule your appointment should you arrive 10 or more minutes late.  We strive to give you quality time with our providers, and arriving late affects you and other patients whose appointments are after yours.  Also, if you no show three or more times for appointments you may be dismissed from the clinic at the providers discretion.     Again, thank you for choosing [facility].  Our hope is that these requests will decrease the amount of time that you wait before being seen by our physicians.       _____________________________________________________________  Should you have questions after your visit to [facility], please contact our office at [phone] between the hours of 8:00 a.m. and 4:30 p.m.  Voicemails left after 4:00 p.m. will not be returned until the following business day.  For prescription refill requests, have your pharmacy contact our office and allow 72 hours.    Cancer Center Support Programs:   > Cancer Support Group  2nd Tuesday of the month 1pm-2pm, Journey Room    

## 2019-04-30 DIAGNOSIS — D692 Other nonthrombocytopenic purpura: Secondary | ICD-10-CM | POA: Diagnosis not present

## 2019-04-30 DIAGNOSIS — L570 Actinic keratosis: Secondary | ICD-10-CM | POA: Diagnosis not present

## 2019-04-30 DIAGNOSIS — L821 Other seborrheic keratosis: Secondary | ICD-10-CM | POA: Diagnosis not present

## 2019-04-30 DIAGNOSIS — D1801 Hemangioma of skin and subcutaneous tissue: Secondary | ICD-10-CM | POA: Diagnosis not present

## 2019-04-30 DIAGNOSIS — Z85828 Personal history of other malignant neoplasm of skin: Secondary | ICD-10-CM | POA: Diagnosis not present

## 2019-04-30 DIAGNOSIS — Z85820 Personal history of malignant melanoma of skin: Secondary | ICD-10-CM | POA: Diagnosis not present

## 2019-05-17 ENCOUNTER — Other Ambulatory Visit: Payer: Self-pay | Admitting: Family Medicine

## 2019-05-21 ENCOUNTER — Ambulatory Visit (HOSPITAL_COMMUNITY): Payer: No Typology Code available for payment source

## 2019-05-22 ENCOUNTER — Ambulatory Visit: Payer: Self-pay | Admitting: Radiation Oncology

## 2019-05-27 DIAGNOSIS — I2510 Atherosclerotic heart disease of native coronary artery without angina pectoris: Secondary | ICD-10-CM | POA: Diagnosis not present

## 2019-05-27 DIAGNOSIS — E1165 Type 2 diabetes mellitus with hyperglycemia: Secondary | ICD-10-CM | POA: Diagnosis not present

## 2019-05-27 DIAGNOSIS — I10 Essential (primary) hypertension: Secondary | ICD-10-CM | POA: Diagnosis not present

## 2019-05-27 DIAGNOSIS — G8929 Other chronic pain: Secondary | ICD-10-CM | POA: Diagnosis not present

## 2019-05-27 DIAGNOSIS — I4891 Unspecified atrial fibrillation: Secondary | ICD-10-CM | POA: Diagnosis not present

## 2019-05-27 DIAGNOSIS — J449 Chronic obstructive pulmonary disease, unspecified: Secondary | ICD-10-CM | POA: Diagnosis not present

## 2019-05-27 DIAGNOSIS — Z794 Long term (current) use of insulin: Secondary | ICD-10-CM | POA: Diagnosis not present

## 2019-05-27 DIAGNOSIS — E1142 Type 2 diabetes mellitus with diabetic polyneuropathy: Secondary | ICD-10-CM | POA: Diagnosis not present

## 2019-05-27 DIAGNOSIS — E039 Hypothyroidism, unspecified: Secondary | ICD-10-CM | POA: Diagnosis not present

## 2019-05-27 DIAGNOSIS — D6869 Other thrombophilia: Secondary | ICD-10-CM | POA: Diagnosis not present

## 2019-06-04 ENCOUNTER — Ambulatory Visit (HOSPITAL_COMMUNITY)
Admission: RE | Admit: 2019-06-04 | Discharge: 2019-06-04 | Disposition: A | Discharge status: Home / Self Care | Payer: Medicare HMO | Source: Ambulatory Visit | Attending: Radiation Oncology | Admitting: Radiation Oncology

## 2019-06-04 ENCOUNTER — Other Ambulatory Visit: Payer: Self-pay

## 2019-06-04 DIAGNOSIS — C3491 Malignant neoplasm of unspecified part of right bronchus or lung: Secondary | ICD-10-CM

## 2019-06-04 DIAGNOSIS — C3490 Malignant neoplasm of unspecified part of unspecified bronchus or lung: Secondary | ICD-10-CM | POA: Diagnosis not present

## 2019-06-05 ENCOUNTER — Encounter: Payer: Self-pay | Admitting: Radiation Oncology

## 2019-06-05 ENCOUNTER — Ambulatory Visit (INDEPENDENT_AMBULATORY_CARE_PROVIDER_SITE_OTHER): Payer: Medicare HMO

## 2019-06-05 ENCOUNTER — Ambulatory Visit
Admission: RE | Admit: 2019-06-05 | Discharge: 2019-06-05 | Disposition: A | Discharge status: Home / Self Care | Payer: Medicare HMO | Source: Ambulatory Visit | Attending: Radiation Oncology | Admitting: Radiation Oncology

## 2019-06-05 ENCOUNTER — Other Ambulatory Visit: Payer: Self-pay

## 2019-06-05 VITALS — BP 147/84 | HR 99 | Temp 97.8°F | Resp 18 | Ht 72.0 in | Wt 194.4 lb

## 2019-06-05 DIAGNOSIS — Z85820 Personal history of malignant melanoma of skin: Secondary | ICD-10-CM | POA: Diagnosis not present

## 2019-06-05 DIAGNOSIS — C3491 Malignant neoplasm of unspecified part of right bronchus or lung: Secondary | ICD-10-CM

## 2019-06-05 DIAGNOSIS — C3431 Malignant neoplasm of lower lobe, right bronchus or lung: Secondary | ICD-10-CM | POA: Insufficient documentation

## 2019-06-05 DIAGNOSIS — I700 Atherosclerosis of aorta: Secondary | ICD-10-CM | POA: Insufficient documentation

## 2019-06-05 DIAGNOSIS — Z23 Encounter for immunization: Secondary | ICD-10-CM

## 2019-06-05 DIAGNOSIS — Z8546 Personal history of malignant neoplasm of prostate: Secondary | ICD-10-CM | POA: Insufficient documentation

## 2019-06-05 DIAGNOSIS — Z79899 Other long term (current) drug therapy: Secondary | ICD-10-CM | POA: Insufficient documentation

## 2019-06-05 DIAGNOSIS — Z923 Personal history of irradiation: Secondary | ICD-10-CM | POA: Insufficient documentation

## 2019-06-05 DIAGNOSIS — Z7984 Long term (current) use of oral hypoglycemic drugs: Secondary | ICD-10-CM | POA: Insufficient documentation

## 2019-06-05 DIAGNOSIS — Z08 Encounter for follow-up examination after completed treatment for malignant neoplasm: Secondary | ICD-10-CM | POA: Diagnosis not present

## 2019-06-05 DIAGNOSIS — J439 Emphysema, unspecified: Secondary | ICD-10-CM | POA: Diagnosis not present

## 2019-06-05 DIAGNOSIS — Z85118 Personal history of other malignant neoplasm of bronchus and lung: Secondary | ICD-10-CM | POA: Diagnosis not present

## 2019-06-05 DIAGNOSIS — N281 Cyst of kidney, acquired: Secondary | ICD-10-CM | POA: Diagnosis not present

## 2019-06-05 NOTE — Progress Notes (Signed)
Pt presents today for f/u with Dr. Sondra Come to review recent CT results. Pt denies c/o pain. Pt reports baseline dry cough. Pt denies hemoptysis. Pt reports "yellow mucus all the time". Pt denies difficulty breathing, reports SOB with exertion. Pt denies difficulty swallowing.   BP (!) 147/84 (BP Location: Left Arm, Patient Position: Sitting)   Pulse 99   Temp 97.8 F (36.6 C) (Temporal)   Resp 18   Ht 6' (1.829 m)   Wt 194 lb 6 oz (88.2 kg)   SpO2 98%   BMI 26.36 kg/m   Wt Readings from Last 3 Encounters:  06/05/19 194 lb 6 oz (88.2 kg)  04/16/19 190 lb (86.2 kg)  03/25/19 189 lb 12.8 oz (86.1 kg)    Loma Sousa, RN BSN

## 2019-06-05 NOTE — Progress Notes (Signed)
Radiation Oncology         [phone]) [phone] ________________________________  Name: Timothy Guerrero MRN: [id number]  Date: 06/05/2019  DOB: 25-Feb-1941  Follow-Up Visit Note  CC: Dettinger, Fransisca Kaufmann, MD  Melrose Nakayama, *    ICD-10-CM   1. Squamous cell carcinoma of right lung (HCC)  C34.91     Diagnosis:   78 y.o. 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 The Chest was treated to 46 Gy in 23 fractions of 2 Gy. 2. The Chest was boosted to 20 Gy in 10 fractions of 2 Gy.(66 Gy)  Narrative:  The patient returns today for routine follow-up to review recent imaging results. His most recent chest CT was performed yesterday, 06/04/2019, and showed no evidence of recurrent or metastatic disease.  On review of systems, the patient reports baseline dry cough. Pt denies hemoptysis. Pt reports "yellow mucus all the time". Pt denies difficulty breathing, reports SOB with exertion. Pt denies difficulty swallowing.   ALLERGIES:  has No Known Allergies.  Meds: Current Outpatient Medications  Medication Sig Dispense Refill  . acetaminophen (TYLENOL) 325 MG tablet Take 2 tablets (650 mg total) by mouth every 6 (six) hours as needed for mild pain, fever or headache (or Fever >/= 101). 12 tablet 1  . albuterol (PROVENTIL HFA;VENTOLIN HFA) 108 (90 Base) MCG/ACT inhaler Inhale 1 puff into the lungs every 6 (six) hours as needed for wheezing or shortness of breath.    [name] amLODipine (NORVASC) 10 MG tablet Take 1 tablet (10 mg total) by mouth daily. 90 tablet 3  . ASCORBIC ACID PO Take by mouth.    . cholecalciferol (VITAMIN D3) 25 MCG (1000 UT) tablet Take 1,000 Units by mouth daily.    . Cyanocobalamin (VITAMIN B-12 PO) Take by mouth.    . fluticasone furoate-vilanterol (BREO ELLIPTA) 200-25 MCG/INH AEPB Inhale 1 puff into the lungs  daily. 1 each 6  . furosemide (LASIX) 20 MG tablet TAKE 1 TABLET BY MOUTH EVERY DAY 90 tablet 0  . gabapentin (NEURONTIN) 300 MG capsule Take 300 mg by mouth 3 (three) times daily.    . insulin aspart protamine- aspart (NOVOLOG MIX 70/30) (70-30) 100 UNIT/ML injection Inject 35-40 Units into the skin.    [name] levothyroxine (SYNTHROID) 50 MCG tablet TAKE 1 TABLET (50 MCG TOTAL) BY MOUTH DAILY BEFORE BREAKFAST. ((FOR THYROID)) 90 tablet 2  . losartan (COZAAR) 50 MG tablet Take 25 mg by mouth daily.    . magnesium oxide (MAG-OX) 400 MG tablet Take 400 mg by mouth 2 (two) times daily.    . metFORMIN (GLUCOPHAGE) 1000 MG tablet Take 1 tablet (1,000 mg total) by mouth 2 (two) times daily with a meal. 180 tablet 3  . metoprolol (TOPROL-XL) 200 MG 24 hr tablet Take 1 tablet (200 mg total) by mouth daily. 90 tablet 3  . pantoprazole (PROTONIX) 40 MG tablet Take 1 tablet (40 mg total) by mouth daily. 90 tablet 3  . Potassium Chloride ER 20 MEQ TBCR Take 20 mEq by mouth daily. Take only on days when taking lasix 90 tablet 2  . rosuvastatin (CRESTOR) 20 MG tablet Take 20 mg by mouth daily.    . Semaglutide (OZEMPIC) 0.25 or 0.5 MG/DOSE SOPN Inject 0.5 mg into the skin once a week. (Patient taking differently: Inject 1 mg into the skin once a week. ) 12  pen 1  . Tiotropium Bromide-Olodaterol (STIOLTO RESPIMAT IN) Inhale 1 puff into the lungs daily as needed (shortness of breath).    . valACYclovir (VALTREX) 1000 MG tablet Take 1,000 mg by mouth daily as needed (cold sores).     . triamcinolone cream (KENALOG) 0.1 % Apply 1 application topically 2 (two) times daily. (Patient not taking: Reported on 04/16/2019) 30 g 0   No current facility-administered medications for this encounter.     Physical Findings: The patient is in no acute distress. Patient is alert and oriented.  height is 6' (1.829 m) and weight is 194 lb 6 oz (88.2 kg). His temporal temperature is 97.8 F (36.6 C). His blood pressure is 147/84  (abnormal) and his pulse is 99. His respiration is 18 and oxygen saturation is 98%.   Lungs are clear to auscultation bilaterally, no wheezing. Heart has regular rate and rhythm. No palpable cervical, supraclavicular, or axillary adenopathy. Abdomen soft, non-tender, normal bowel sounds.    Lab Findings: Lab Results  Component Value Date   WBC 6.6 04/11/2019   HGB 12.6 (L) 04/11/2019   HCT 42.1 04/11/2019   MCV 85.9 04/11/2019   PLT 298 04/11/2019    Radiographic Findings: Ct Chest Wo Contrast  Result Date: 06/04/2019 CLINICAL DATA:  Non-small cell lung cancer, status post right lower lobectomy and radiation. History of prostate cancer and melanoma. EXAM: CT CHEST WITHOUT CONTRAST TECHNIQUE: Multidetector CT imaging of the chest was performed following the standard protocol without IV contrast. COMPARISON:  03/10/2019 FINDINGS: Cardiovascular: Heart is normal in size.  No pericardial effusion. No evidence of thoracic aortic aneurysm. Atherosclerotic calcifications of the aortic arch. Three vessel coronary atherosclerosis. Mediastinum/Nodes: No suspicious mediastinal lymphadenopathy. Visualized thyroid is unremarkable. Lungs/Pleura: Status post right lower lobectomy. Mild paraseptal emphysematous changes, left upper lobe predominant. 2 mm anterior left upper lobe nodule (series 4/image 17), unchanged, benign. No new/suspicious pulmonary nodules. No focal consolidation. No pleural effusion or pneumothorax. Upper Abdomen: Visualized upper abdomen is notable for a stable 15 mm right adrenal nodule, likely reflecting a benign adrenal adenoma. Vascular calcifications. Bilateral renal cysts, measuring up to 4.1 cm in the anterior right upper kidney. Prior splenic infarcts are less conspicuous on the current study. Musculoskeletal: Degenerative changes of the visualized thoracolumbar spine. IMPRESSION: Status post right lower lobectomy. No evidence of recurrent or metastatic disease. Aortic Atherosclerosis  (ICD10-I70.0) and Emphysema (ICD10-J43.9). Electronically Signed   By: Julian Hy M.D.   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   No evidence of recurrence on clinical exam and recent chest CT scan.  Plan:  Return in 6 months and repeat a chest CT scan at that time.  ____________________________________  Blair Promise, PhD, MD  This document serves as a record of services personally performed by Gery Pray, MD. It was created on his behalf by Wilburn Mylar, a trained medical scribe. The creation of this record is based on the scribe's personal observations and the provider's statements to them. This document has been checked and approved by the attending provider.

## 2019-06-05 NOTE — Patient Instructions (Signed)
Coronavirus (COVID-19) Are you at risk?  Are you at risk for the Coronavirus (COVID-19)?  To be considered HIGH RISK for Coronavirus (COVID-19), you have to meet the following criteria:  . Traveled to [place], [place], [place], [place] or [place]; or in the [place] to [place], [place], [place], or [place]; and have fever, cough, and shortness of breath within the last 2 weeks of travel OR . Been in close contact with a person diagnosed with COVID-19 within the last 2 weeks and have fever, cough, and shortness of breath . IF YOU DO NOT MEET THESE CRITERIA, YOU ARE CONSIDERED LOW RISK FOR COVID-19.  What to do if you are HIGH RISK for COVID-19?  . If you are having a medical emergency, call 911. . Seek medical care right away. Before you go to a doctor's office, urgent care or emergency department, call ahead and tell them about your recent travel, contact with someone diagnosed with COVID-19, and your symptoms. You should receive instructions from your physician's office regarding next steps of care.  . When you arrive at healthcare provider, tell the healthcare staff immediately you have returned from visiting [place], [place], [place], [place] or [place]; or traveled in the [place] to [place], [place], [place], or [place]; in the last two weeks or you have been in close contact with a person diagnosed with COVID-19 in the last 2 weeks.   . Tell the health care staff about your symptoms: fever, cough and shortness of breath. . After you have been seen by a medical provider, you will be either: o Tested for (COVID-19) and discharged home on quarantine except to seek medical care if symptoms worsen, and asked to  - Stay home and avoid contact with others until you get your results (4-5 days)  - Avoid travel on public transportation if possible (such as bus, train, or airplane) or o Sent to the Emergency Department by EMS for evaluation, COVID-19 testing, and possible  admission depending on your condition and test results.  What to do if you are LOW RISK for COVID-19?  Reduce your risk of any infection by using the same precautions used for avoiding the common cold or flu:  . Wash your hands often with soap and warm water for at least 20 seconds.  If soap and water are not readily available, use an alcohol-based hand sanitizer with at least 60% alcohol.  . If coughing or sneezing, cover your mouth and nose by coughing or sneezing into the elbow areas of your shirt or coat, into a tissue or into your sleeve (not your hands). . Avoid shaking hands with others and consider head nods or verbal greetings only. . Avoid touching your eyes, nose, or mouth with unwashed hands.  . Avoid close contact with people who are sick. . Avoid places or events with large numbers of people in one location, like concerts or sporting events. . Carefully consider travel plans you have or are making. . If you are planning any travel outside or inside the [place], visit the CDC's Travelers' Health webpage for the latest health notices. . If you have some symptoms but not all symptoms, continue to monitor at home and seek medical attention if your symptoms worsen. . If you are having a medical emergency, call 911.   ADDITIONAL HEALTHCARE OPTIONS FOR PATIENTS   Telehealth / e-Visit: [email or web address]         [facility]: [phone]  [facility]: [phone]                   [facility]: [phone]   

## 2019-06-13 DIAGNOSIS — C4359 Malignant melanoma of other part of trunk: Secondary | ICD-10-CM | POA: Diagnosis not present

## 2019-06-13 DIAGNOSIS — D485 Neoplasm of uncertain behavior of skin: Secondary | ICD-10-CM | POA: Diagnosis not present

## 2019-06-13 DIAGNOSIS — D1801 Hemangioma of skin and subcutaneous tissue: Secondary | ICD-10-CM | POA: Diagnosis not present

## 2019-06-13 DIAGNOSIS — L570 Actinic keratosis: Secondary | ICD-10-CM | POA: Diagnosis not present

## 2019-06-13 DIAGNOSIS — Z85820 Personal history of malignant melanoma of skin: Secondary | ICD-10-CM | POA: Diagnosis not present

## 2019-06-13 DIAGNOSIS — L821 Other seborrheic keratosis: Secondary | ICD-10-CM | POA: Diagnosis not present

## 2019-06-13 DIAGNOSIS — Z85828 Personal history of other malignant neoplasm of skin: Secondary | ICD-10-CM | POA: Diagnosis not present

## 2019-06-13 DIAGNOSIS — D2261 Melanocytic nevi of right upper limb, including shoulder: Secondary | ICD-10-CM | POA: Diagnosis not present

## 2019-06-23 DIAGNOSIS — C4359 Malignant melanoma of other part of trunk: Secondary | ICD-10-CM | POA: Diagnosis not present

## 2019-06-23 DIAGNOSIS — D225 Melanocytic nevi of trunk: Secondary | ICD-10-CM | POA: Diagnosis not present

## 2019-06-23 DIAGNOSIS — Z85828 Personal history of other malignant neoplasm of skin: Secondary | ICD-10-CM | POA: Diagnosis not present

## 2019-06-23 DIAGNOSIS — Z85820 Personal history of malignant melanoma of skin: Secondary | ICD-10-CM | POA: Diagnosis not present

## 2019-06-23 DIAGNOSIS — L821 Other seborrheic keratosis: Secondary | ICD-10-CM | POA: Diagnosis not present

## 2019-06-26 ENCOUNTER — Other Ambulatory Visit: Payer: Self-pay | Admitting: Family Medicine

## 2019-07-06 ENCOUNTER — Other Ambulatory Visit: Payer: Self-pay | Admitting: Family Medicine

## 2019-07-16 ENCOUNTER — Telehealth: Payer: Self-pay | Admitting: Family Medicine

## 2019-07-16 ENCOUNTER — Encounter: Payer: Self-pay | Admitting: Family Medicine

## 2019-07-16 ENCOUNTER — Ambulatory Visit (INDEPENDENT_AMBULATORY_CARE_PROVIDER_SITE_OTHER): Payer: Medicare HMO | Admitting: Family Medicine

## 2019-07-16 DIAGNOSIS — R197 Diarrhea, unspecified: Secondary | ICD-10-CM | POA: Diagnosis not present

## 2019-07-16 DIAGNOSIS — Z86010 Personal history of colonic polyps: Secondary | ICD-10-CM | POA: Diagnosis not present

## 2019-07-16 MED ORDER — SACCHAROMYCES BOULARDII 250 MG PO CAPS: 250.0000 mg | ORAL_CAPSULE | Freq: Two times a day (BID) | ORAL | 1 refills | Status: DC

## 2019-07-16 NOTE — Patient Instructions (Addendum)
Timothy Guerrero  Schedule appointment with gastroenterology for repeat colonoscopy.

## 2019-07-16 NOTE — Progress Notes (Signed)
Virtual Visit via Telephone Note  I connected with Timothy Guerrero on 07/16/19 at 8:09 AM by telephone and verified that I am speaking with the correct person using two identifiers. Timothy Guerrero is currently located at home and nobody is currently with him during this visit. The provider, Loman Brooklyn, FNP is located in their home at time of visit.  I discussed the limitations, risks, security and privacy concerns of performing an evaluation and management service by telephone and the availability of in person appointments. I also discussed with the patient that there may be a patient responsible charge related to this service. The patient expressed understanding and agreed to proceed.  Subjective: PCP: Dettinger, Fransisca Kaufmann, MD  Chief Complaint  Patient presents with  . Abdominal Pain  . Diarrhea   Diarrhea: Patient complains of diarrhea. Onset of diarrhea was a couple of months ago; he reports it comes and goes.  Patient reports in between episodes of diarrhea last about 2 to 3 weeks.  The current episode of diarrhea has been going on for 2 weeks.  Diarrhea is occurring approximately 6-8 times per day. Patient describes diarrhea as watery. Diarrhea has been associated with RLQ abdominal pain described as gas, belching, and decreased appetite.  Patient denies blood in stool, fever, illness in household contacts, recent antibiotic use.  Previous visits for diarrhea: none.  Patient has had three negative COVID-19 tests due to his symptoms.  He has tried taking Pepto-Bismol and his girlfriends colestipol which has had no effect.  Patient has a history of a colon polyp that was early cancer back in 2007 one year later he did not have any polyps.  It was recommended he have his next colonoscopy in 2010 which he did not do.  He has not had any colonoscopies since that time.  He reports his provider at the [place] has also been encouraging him to have another colonoscopy.  He has been taking his  Protonix 40 mg once daily.   ROS: Per HPI  Current Outpatient Medications:  .  acetaminophen (TYLENOL) 325 MG tablet, Take 2 tablets (650 mg total) by mouth every 6 (six) hours as needed for mild pain, fever or headache (or Fever >/= 101)., Disp: 12 tablet, Rfl: 1 .  albuterol (PROVENTIL HFA;VENTOLIN HFA) 108 (90 Base) MCG/ACT inhaler, Inhale 1 puff into the lungs every 6 (six) hours as needed for wheezing or shortness of breath., Disp: , Rfl:  .  amLODipine (NORVASC) 10 MG tablet, Take 1 tablet (10 mg total) by mouth daily., Disp: 90 tablet, Rfl: 3 .  ASCORBIC ACID PO, Take by mouth., Disp: , Rfl:  .  cholecalciferol (VITAMIN D3) 25 MCG (1000 UT) tablet, Take 1,000 Units by mouth daily., Disp: , Rfl:  .  Cyanocobalamin (VITAMIN B-12 PO), Take by mouth., Disp: , Rfl:  .  fluticasone furoate-vilanterol (BREO ELLIPTA) 200-25 MCG/INH AEPB, Inhale 1 puff into the lungs daily., Disp: 1 each, Rfl: 6 .  furosemide (LASIX) 20 MG tablet, TAKE 1 TABLET BY MOUTH EVERY DAY, Disp: 90 tablet, Rfl: 0 .  gabapentin (NEURONTIN) 300 MG capsule, Take 300 mg by mouth 3 (three) times daily., Disp: , Rfl:  .  insulin aspart protamine- aspart (NOVOLOG MIX 70/30) (70-30) 100 UNIT/ML injection, Inject 35-40 Units into the skin., Disp: , Rfl:  .  levothyroxine (SYNTHROID) 50 MCG tablet, TAKE 1 TABLET (50 MCG TOTAL) BY MOUTH DAILY BEFORE BREAKFAST. ((FOR THYROID)), Disp: 90 tablet, Rfl: 2 .  losartan (COZAAR) 50 MG tablet, Take 25 mg by mouth daily., Disp: , Rfl:  .  magnesium oxide (MAG-OX) 400 MG tablet, Take 400 mg by mouth 2 (two) times daily., Disp: , Rfl:  .  metFORMIN (GLUCOPHAGE) 1000 MG tablet, TAKE 1 TABLET (1,000 MG TOTAL) BY MOUTH 2 (TWO) TIMES DAILY WITH A MEAL., Disp: 180 tablet, Rfl: 0 .  metoprolol (TOPROL-XL) 200 MG 24 hr tablet, Take 1 tablet (200 mg total) by mouth daily., Disp: 90 tablet, Rfl: 3 .  pantoprazole (PROTONIX) 40 MG tablet, Take 1 tablet (40 mg total) by mouth daily., Disp: 90 tablet, Rfl:  3 .  Potassium Chloride ER 20 MEQ TBCR, Take 20 mEq by mouth daily. Take only on days when taking lasix, Disp: 90 tablet, Rfl: 2 .  rosuvastatin (CRESTOR) 20 MG tablet, Take 20 mg by mouth daily., Disp: , Rfl:  .  Semaglutide (OZEMPIC) 0.25 or 0.5 MG/DOSE SOPN, Inject 0.5 mg into the skin once a week. (Patient taking differently: Inject 1 mg into the skin once a week. ), Disp: 12 pen, Rfl: 1 .  Tiotropium Bromide-Olodaterol (STIOLTO RESPIMAT IN), Inhale 1 puff into the lungs daily as needed (shortness of breath)., Disp: , Rfl:  .  triamcinolone cream (KENALOG) 0.1 %, Apply 1 application topically 2 (two) times daily. (Patient not taking: Reported on 04/16/2019), Disp: 30 g, Rfl: 0 .  valACYclovir (VALTREX) 1000 MG tablet, Take 1,000 mg by mouth daily as needed (cold sores). , Disp: , Rfl:   No Known Allergies Past Medical History:  Diagnosis Date  . Anemia   . Arthritis   . Atrial flutter ([place])    post op following lobectomy  . Benign essential tremor   . Blood transfusion without reported diagnosis   . Cataract   . CHF (congestive heart failure) ([place])   . Colon polyp    2007, polyp with early cancer, one year f/u no residual polyp. next TCS 2010, see PSH. [facility].  [name] COPD (chronic obstructive pulmonary disease) ([place])   . Diabetes mellitus without complication ([place])    10 years  . GERD (gastroesophageal reflux disease)   . HOH (hard of hearing)   . Hypercholesteremia   . Hypertension    10 years  . Lung cancer ([place])   . Prostate cancer ([place])   . Shortness of breath dyspnea    due to lung mass  . Skin melanoma ([place])   . Sleep apnea    wears CPAP sometimes, cannot tolerate all the time.    Observations/Objective: A&O  No respiratory distress or wheezing audible over the phone Mood, judgement, and thought processes all WNL  Assessment and Plan: 1-2. Diarrhea, unspecified type/History of colon polyps - Encouraged patient to try Florastor probiotic and Beano  gas to help with current symptoms.  Ultimately discussed that given his history of a colon polyp that was early cancer 13 years ago he really needs to have a colonoscopy.  Strongly encouraged him to reach out to his gastroenterologist to schedule an appointment. - saccharomyces boulardii (FLORASTOR) 250 MG capsule; Take 1 capsule (250 mg total) by mouth 2 (two) times daily.  Dispense: 60 capsule; Refill: 1   Follow Up Instructions:  I discussed the assessment and treatment plan with the patient. The patient was provided an opportunity to ask questions and all were answered. The patient agreed with the plan and demonstrated an understanding of the instructions.   The patient was advised to call back or  seek an in-person evaluation if the symptoms worsen or if the condition fails to improve as anticipated.  The above assessment and management plan was discussed with the patient. The patient verbalized understanding of and has agreed to the management plan. Patient is aware to call the clinic if symptoms persist or worsen. Patient is aware when to return to the clinic for a follow-up visit. Patient educated on when it is appropriate to go to the emergency department.   Time call ended: 8:20 AM  I provided 13 minutes of non-face-to-face time during this encounter.  Hendricks Limes, MSN, APRN, FNP-C [facility] 07/16/19

## 2019-07-16 NOTE — Telephone Encounter (Signed)
Per chart was sent to [facility]

## 2019-07-18 NOTE — Telephone Encounter (Signed)
Pt wants to talk to nurse about rx sent in---he does not remember the name. Please call back

## 2019-07-18 NOTE — Telephone Encounter (Signed)
Attempted to contact patient - NA °

## 2019-07-24 ENCOUNTER — Other Ambulatory Visit: Payer: Self-pay | Admitting: Family Medicine

## 2019-07-24 NOTE — Telephone Encounter (Signed)
Last seen 04/2019 Last cmp 04/2019

## 2019-07-25 NOTE — Telephone Encounter (Signed)
Prescription sent to pharmacy.

## 2019-08-12 ENCOUNTER — Inpatient Hospital Stay (HOSPITAL_COMMUNITY): Payer: Medicare HMO | Attending: Hematology

## 2019-08-12 ENCOUNTER — Other Ambulatory Visit: Payer: Self-pay

## 2019-08-12 DIAGNOSIS — Z923 Personal history of irradiation: Secondary | ICD-10-CM | POA: Diagnosis not present

## 2019-08-12 DIAGNOSIS — Z794 Long term (current) use of insulin: Secondary | ICD-10-CM | POA: Insufficient documentation

## 2019-08-12 DIAGNOSIS — Z8546 Personal history of malignant neoplasm of prostate: Secondary | ICD-10-CM | POA: Diagnosis not present

## 2019-08-12 DIAGNOSIS — Z8249 Family history of ischemic heart disease and other diseases of the circulatory system: Secondary | ICD-10-CM | POA: Diagnosis not present

## 2019-08-12 DIAGNOSIS — Z79899 Other long term (current) drug therapy: Secondary | ICD-10-CM | POA: Diagnosis not present

## 2019-08-12 DIAGNOSIS — I509 Heart failure, unspecified: Secondary | ICD-10-CM | POA: Insufficient documentation

## 2019-08-12 DIAGNOSIS — Z7951 Long term (current) use of inhaled steroids: Secondary | ICD-10-CM | POA: Diagnosis not present

## 2019-08-12 DIAGNOSIS — I110 Hypertensive heart disease with heart failure: Secondary | ICD-10-CM | POA: Diagnosis not present

## 2019-08-12 DIAGNOSIS — Z809 Family history of malignant neoplasm, unspecified: Secondary | ICD-10-CM | POA: Insufficient documentation

## 2019-08-12 DIAGNOSIS — D61818 Other pancytopenia: Secondary | ICD-10-CM

## 2019-08-12 DIAGNOSIS — E119 Type 2 diabetes mellitus without complications: Secondary | ICD-10-CM | POA: Diagnosis not present

## 2019-08-12 DIAGNOSIS — J449 Chronic obstructive pulmonary disease, unspecified: Secondary | ICD-10-CM | POA: Insufficient documentation

## 2019-08-12 DIAGNOSIS — G4730 Sleep apnea, unspecified: Secondary | ICD-10-CM | POA: Insufficient documentation

## 2019-08-12 DIAGNOSIS — Z87891 Personal history of nicotine dependence: Secondary | ICD-10-CM | POA: Insufficient documentation

## 2019-08-12 DIAGNOSIS — Z85820 Personal history of malignant melanoma of skin: Secondary | ICD-10-CM | POA: Insufficient documentation

## 2019-08-12 DIAGNOSIS — E7800 Pure hypercholesterolemia, unspecified: Secondary | ICD-10-CM | POA: Insufficient documentation

## 2019-08-12 DIAGNOSIS — Z8042 Family history of malignant neoplasm of prostate: Secondary | ICD-10-CM | POA: Insufficient documentation

## 2019-08-12 DIAGNOSIS — Z803 Family history of malignant neoplasm of breast: Secondary | ICD-10-CM | POA: Insufficient documentation

## 2019-08-12 DIAGNOSIS — Z85118 Personal history of other malignant neoplasm of bronchus and lung: Secondary | ICD-10-CM | POA: Diagnosis not present

## 2019-08-12 LAB — CBC WITH DIFFERENTIAL/PLATELET
Abs Immature Granulocytes: 0.01 10*3/uL (ref 0.00–0.07)
Basophils Absolute: 0.1 10*3/uL (ref 0.0–0.1)
Basophils Relative: 1 %
Eosinophils Absolute: 0.3 10*3/uL (ref 0.0–0.5)
Eosinophils Relative: 6 %
HCT: 40.8 % (ref 39.0–52.0)
Hemoglobin: 12.1 g/dL — ABNORMAL LOW (ref 13.0–17.0)
Immature Granulocytes: 0 %
Lymphocytes Relative: 20 %
Lymphs Abs: 0.9 10*3/uL (ref 0.7–4.0)
MCH: 24.5 pg — ABNORMAL LOW (ref 26.0–34.0)
MCHC: 29.7 g/dL — ABNORMAL LOW (ref 30.0–36.0)
MCV: 82.6 fL (ref 80.0–100.0)
Monocytes Absolute: 0.6 10*3/uL (ref 0.1–1.0)
Monocytes Relative: 12 %
Neutro Abs: 2.9 10*3/uL (ref 1.7–7.7)
Neutrophils Relative %: 61 %
Platelets: 209 10*3/uL (ref 150–400)
RBC: 4.94 MIL/uL (ref 4.22–5.81)
RDW: 17.2 % — ABNORMAL HIGH (ref 11.5–15.5)
WBC: 4.7 10*3/uL (ref 4.0–10.5)
nRBC: 0 % (ref 0.0–0.2)

## 2019-08-12 LAB — COMPREHENSIVE METABOLIC PANEL
ALT: 14 U/L (ref 0–44)
AST: 16 U/L (ref 15–41)
Albumin: 4 g/dL (ref 3.5–5.0)
Alkaline Phosphatase: 62 U/L (ref 38–126)
Anion gap: 8 (ref 5–15)
BUN: 14 mg/dL (ref 8–23)
CO2: 29 mmol/L (ref 22–32)
Calcium: 9.3 mg/dL (ref 8.9–10.3)
Chloride: 104 mmol/L (ref 98–111)
Creatinine, Ser: 0.83 mg/dL (ref 0.61–1.24)
GFR calc Af Amer: >60 mL/min (ref >60)
GFR calc non Af Amer: >60 mL/min (ref >60)
Glucose, Bld: 382 mg/dL — ABNORMAL HIGH (ref 70–99)
Potassium: 4.2 mmol/L (ref 3.5–5.1)
Sodium: 141 mmol/L (ref 135–145)
Total Bilirubin: 0.7 mg/dL (ref 0.3–1.2)
Total Protein: 6.7 g/dL (ref 6.5–8.1)

## 2019-08-12 LAB — LACTATE DEHYDROGENASE: LDH: 133 U/L (ref 98–192)

## 2019-08-19 ENCOUNTER — Other Ambulatory Visit: Payer: Self-pay

## 2019-08-19 ENCOUNTER — Inpatient Hospital Stay (HOSPITAL_BASED_OUTPATIENT_CLINIC_OR_DEPARTMENT_OTHER): Payer: Medicare HMO | Admitting: Hematology

## 2019-08-19 ENCOUNTER — Encounter (HOSPITAL_COMMUNITY): Payer: Self-pay | Admitting: Hematology

## 2019-08-19 VITALS — BP 147/75 | HR 97 | Temp 96.9°F | Resp 18 | Wt 195.7 lb

## 2019-08-19 DIAGNOSIS — E119 Type 2 diabetes mellitus without complications: Secondary | ICD-10-CM | POA: Diagnosis not present

## 2019-08-19 DIAGNOSIS — I509 Heart failure, unspecified: Secondary | ICD-10-CM | POA: Diagnosis not present

## 2019-08-19 DIAGNOSIS — D61818 Other pancytopenia: Secondary | ICD-10-CM

## 2019-08-19 DIAGNOSIS — E7800 Pure hypercholesterolemia, unspecified: Secondary | ICD-10-CM | POA: Diagnosis not present

## 2019-08-19 DIAGNOSIS — I110 Hypertensive heart disease with heart failure: Secondary | ICD-10-CM | POA: Diagnosis not present

## 2019-08-19 DIAGNOSIS — Z923 Personal history of irradiation: Secondary | ICD-10-CM | POA: Diagnosis not present

## 2019-08-19 DIAGNOSIS — Z85118 Personal history of other malignant neoplasm of bronchus and lung: Secondary | ICD-10-CM | POA: Diagnosis not present

## 2019-08-19 DIAGNOSIS — J449 Chronic obstructive pulmonary disease, unspecified: Secondary | ICD-10-CM | POA: Diagnosis not present

## 2019-08-19 DIAGNOSIS — Z8546 Personal history of malignant neoplasm of prostate: Secondary | ICD-10-CM | POA: Diagnosis not present

## 2019-08-19 DIAGNOSIS — G4730 Sleep apnea, unspecified: Secondary | ICD-10-CM | POA: Diagnosis not present

## 2019-08-19 NOTE — Assessment & Plan Note (Addendum)
1.  Pancytopenia: -Hospital admission from 03/09/2019 through 03/11/2019 with fevers, found to have severe pancytopenia.  Extensive work-up for nutritional deficiencies, infectious causes were negative. -Last blood transfusion in November 2017 when hemoglobin dropped to 6.6. -We reviewed his CBC.  White count improved to 4.7 with normal ANC.  Platelet count also normalized.  Slightly anemic which is stable. -No further work-up needed.  We will recheck his CBC in 6 months.  2.  Right lung cancer: -Underwent right lower lobectomy on 12/17/2014 showing 2.8 cm squamous cell carcinoma, pT1b, PN 0, margins negative, no visceral pleural invasion. -Bronchial washings for hemoptysis on 11/02/2017 showed malignant cells consistent with non-small cell lung cancer. -He underwent radiation from 12/18/2017 through 01/31/2018 for recurrent lung cancer versus new primary tracheal carcinoma. -Most recent CT of the chest without contrast on 06/04/2019 showed no evidence of recurrence or metastatic disease. -He has another scan scheduled in June.  3.  Diabetes: -His last hemoglobin A1c was around 9.  His NovoLog was increased to 45 units twice daily.  He is also on Ozempic weekly.

## 2019-08-19 NOTE — Patient Instructions (Signed)
[facility] Discharge Instructions  You were seen today by Dr. Katragadda. He went over your recent lab results. He will see you back in 6 months for labs and follow up.   Thank you for choosing [facility] at [facility] to provide your oncology and hematology care.  To afford each patient quality time with our provider, please arrive at least 15 minutes before your scheduled appointment time.   If you have a lab appointment with the Cancer Center please come in thru the  Main Entrance and check in at the main information desk  You need to re-schedule your appointment should you arrive 10 or more minutes late.  We strive to give you quality time with our providers, and arriving late affects you and other patients whose appointments are after yours.  Also, if you no show three or more times for appointments you may be dismissed from the clinic at the providers discretion.     Again, thank you for choosing [facility].  Our hope is that these requests will decrease the amount of time that you wait before being seen by our physicians.       _____________________________________________________________  Should you have questions after your visit to [facility], please contact our office at [phone] between the hours of 8:00 a.m. and 4:30 p.m.  Voicemails left after 4:00 p.m. will not be returned until the following business day.  For prescription refill requests, have your pharmacy contact our office and allow 72 hours.    Cancer Center Support Programs:   > Cancer Support Group  2nd Tuesday of the month 1pm-2pm, Journey Room    

## 2019-08-19 NOTE — Progress Notes (Signed)
[place], [place] [address]   CLINIC:  Medical Oncology/Hematology  PCP:  Dettinger, Fransisca Kaufmann, MD [address] [place] [address] [phone]   REASON FOR VISIT:  Follow-up for pancytopenia.  CURRENT THERAPY: Observation.  BRIEF ONCOLOGIC HISTORY:  Oncology History  Squamous cell lung cancer ([place])  03/09/2015 Initial Diagnosis   Squamous cell lung cancer ([place])   12/21/2015 Imaging   No evidence of local tumor recurrence in the right lung. No definite findings of metastatic disease in the chest. Solitary subpleural 3 mm left lower lobe pulmonary nodule is probably benign, however was not definitely seen on 10/25/2010 chest CT.       CANCER STAGING: Cancer Staging Squamous cell lung cancer ([place]) Staging form: Lung, AJCC 7th Edition - Clinical stage from 03/09/2015: T2, N0, M0 - Unsigned - Pathologic stage from 03/09/2015: Stage IA (T1b, N0, cM0) - Unsigned    INTERVAL HISTORY:  Timothy Guerrero 79 y.o. male seen for follow-up of pancytopenia and a lung cancer.  Reports appetite and energy levels of 100%.  However has mucus which is clear when he coughs.  Denies any hemoptysis.  Denies any chest pains.  No fevers, night sweats or weight loss.  Reports that his insulin dose was increased last Friday when his hemoglobin A1c was high.  He reports that he will undergo left shoulder surgery if his A1c drops below 7.    REVIEW OF SYSTEMS:  Review of Systems  All other systems reviewed and are negative.    PAST MEDICAL/SURGICAL HISTORY:  Past Medical History:  Diagnosis Date  . Anemia   . Arthritis   . Atrial flutter ([place])    post op following lobectomy  . Benign essential tremor   . Blood transfusion without reported diagnosis   . Cataract   . CHF (congestive heart failure) ([place])   . Colon polyp    2007, polyp with early cancer, one year f/u no residual polyp. next TCS 2010, see PSH. [facility].  [name] COPD (chronic  obstructive pulmonary disease) ([place])   . Diabetes mellitus without complication ([place])    10 years  . GERD (gastroesophageal reflux disease)   . HOH (hard of hearing)   . Hypercholesteremia   . Hypertension    10 years  . Lung cancer ([place])   . Prostate cancer ([place])   . Shortness of breath dyspnea    due to lung mass  . Skin melanoma ([place])   . Sleep apnea    wears CPAP sometimes, cannot tolerate all the time.   Past Surgical History:  Procedure Laterality Date  . bottom lobe of right lung removed  12/17/14  . CARDIAC CATHETERIZATION     no PCI  . CATARACT EXTRACTION Left   . CATARACT EXTRACTION W/PHACO Left 01/03/2016   Procedure: CATARACT EXTRACTION PHACO AND INTRAOCULAR LENS PLACEMENT LEFT EYE CDE=7.78;  Surgeon: Williams Che, MD;  Location: [facility];  Service: Ophthalmology;  Laterality: Left;  . CATARACT EXTRACTION W/PHACO Right 03/20/2016   Procedure: CATARACT EXTRACTION PHACO AND INTRAOCULAR LENS PLACEMENT; CDE:  9.58;  Surgeon: Williams Che, MD;  Location: [facility];  Service: Ophthalmology;  Laterality: Right;  . COLONOSCOPY  08/2008   Dr. Leafy Half, [place]: normal, internal grade 1 hemorrhoids. next TCS 08/2013  . COLONOSCOPY N/A 03/04/2014   Procedure: COLONOSCOPY;  Surgeon: Daneil Dolin, MD;  Location: [facility];  Service: Endoscopy;  Laterality: N/A;  7:30am  . EYE SURGERY    . knee replacements     bilateral. over 10 years ago  . LOBECTOMY Right 12/17/2014   Procedure: RIGHT LOWER LOBECTOMY;  Surgeon: Melrose Nakayama, MD;  Location: [facility];  Service: Thoracic;  Laterality: Right;  . LYMPH NODE DISSECTION Right 12/17/2014   Procedure: LYMPH NODE DISSECTION;  Surgeon: Melrose Nakayama, MD;  Location: [facility];  Service: Thoracic;  Laterality: Right;  . NECK SURGERY     melanoma removed.  [name] PROSTATE SURGERY     prostatectomy, radical  . TOTAL HIP ARTHROPLASTY  2007   left  . total shoulder replacement  2011   left  . VIDEO ASSISTED  THORACOSCOPY (VATS)/WEDGE RESECTION Right 12/17/2014   Procedure: RIGHT VIDEO ASSISTED THORACOSCOPY (VATS);  Surgeon: Melrose Nakayama, MD;  Location: [facility];  Service: Thoracic;  Laterality: Right;  [name] VIDEO BRONCHOSCOPY Bilateral 11/17/2016   Procedure: VIDEO BRONCHOSCOPY WITHOUT FLUORO;  Surgeon: Javier Glazier, MD;  Location: [facility];  Service: Cardiopulmonary;  Laterality: Bilateral;  . VIDEO BRONCHOSCOPY N/A 11/01/2017   Procedure: VIDEO BRONCHOSCOPY;  Surgeon: Melrose Nakayama, MD;  Location: [facility];  Service: Thoracic;  Laterality: N/A;     SOCIAL HISTORY:  Social History   Socioeconomic History  . Marital status: Divorced    Spouse name: Not on file  . Number of children: 3  . Years of education: associates  . Highest education level: Associate degree: occupational, Hotel manager, or vocational program  Occupational History  . Occupation: retired    Comment: truck Geophysicist/field seismologist  Tobacco Use  . Smoking status: Former Smoker    Packs/day: 2.00    Years: 32.00    Pack years: 64.00    Types: Cigarettes    Quit date: 07/03/1986    Years since quitting: 33.1  . Smokeless tobacco: Never Used  Substance and Sexual Activity  . Alcohol use: Yes    Alcohol/week: 0.0 standard drinks    Comment: rarely wine  . Drug use: No  . Sexual activity: Yes  Other Topics Concern  . Not on file  Social History Narrative   [employer].  Lives alone.      [facility]:   Originally from [facility]. Served in [employer] with significant exposure to asbestos. 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 He has also lived in [place], [place], & [place]. He has lived in [place] since 1979. 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 No pets currently. Remote bird exposure.       Patient is divorced but has had a girlfriend for 7 months. He enjoys going dancing several times a week. He had 3 children but one passed away at 43 from prostate cancer. He has 17 grandchildren.    Social Determinants of Health   Financial Resource  Strain:   . Difficulty of Paying Living Expenses: Not on file  Food Insecurity:   . Worried About Charity fundraiser in the Last Year: Not on file  . Ran Out of Food in the Last Year: Not on file  Transportation Needs:   . Lack of Transportation (Medical): Not on file  . Lack of Transportation (Non-Medical): Not on file  Physical Activity:   . Days of Exercise per Week: Not on file  . Minutes of Exercise per Session: Not on file  Stress: No Stress Concern Present  . Feeling of Stress : Not at all  Social Connections:   . Frequency  of Communication with Friends and Family: Not on file  . Frequency of Social Gatherings with Friends and Family: Not on file  . Attends Religious Services: Not on file  . Active Member of Clubs or Organizations: Not on file  . Attends Archivist Meetings: Not on file  . Marital Status: Not on file  Intimate Partner Violence:   . Fear of Current or Ex-Partner: Not on file  . Emotionally Abused: Not on file  . Physically Abused: Not on file  . Sexually Abused: Not on file    FAMILY HISTORY:  Family History  Problem Relation Age of Onset  . Cancer Mother        unknown type  . COPD Sister   . Breast cancer Sister   . Cancer Sister 38       breast  . Heart attack Daughter   . Prostate cancer Son   . Colon cancer Neg Hx     CURRENT MEDICATIONS:  Outpatient Encounter Medications as of 08/19/2019  Medication Sig  . amLODipine (NORVASC) 10 MG tablet TAKE 1 TABLET BY MOUTH EVERY DAY  . ASCORBIC ACID PO Take by mouth.  . cholecalciferol (VITAMIN D3) 25 MCG (1000 UT) tablet Take 1,000 Units by mouth daily.  . Cyanocobalamin (VITAMIN B-12 PO) Take by mouth.  . fluticasone furoate-vilanterol (BREO ELLIPTA) 200-25 MCG/INH AEPB Inhale 1 puff into the lungs daily.  . furosemide (LASIX) 20 MG tablet TAKE 1 TABLET BY MOUTH EVERY DAY  . gabapentin (NEURONTIN) 300 MG capsule Take 300 mg by mouth 3 (three) times daily.  . insulin aspart protamine-  aspart (NOVOLOG MIX 70/30) (70-30) 100 UNIT/ML injection Inject 30-45 Units into the skin 2 (two) times daily.   [name] levothyroxine (SYNTHROID) 50 MCG tablet TAKE 1 TABLET (50 MCG TOTAL) BY MOUTH DAILY BEFORE BREAKFAST. ((FOR THYROID))  . losartan (COZAAR) 50 MG tablet Take 25 mg by mouth daily.  . magnesium oxide (MAG-OX) 400 MG tablet Take 400 mg by mouth 2 (two) times daily.  . metFORMIN (GLUCOPHAGE) 1000 MG tablet TAKE 1 TABLET (1,000 MG TOTAL) BY MOUTH 2 (TWO) TIMES DAILY WITH A MEAL.  . metoprolol (TOPROL-XL) 200 MG 24 hr tablet Take 1 tablet (200 mg total) by mouth daily.  . mupirocin ointment (BACTROBAN) 2 % APPLY TO AFFECTED AREA EVERY DAY  . pantoprazole (PROTONIX) 40 MG tablet Take 1 tablet (40 mg total) by mouth daily.  . Potassium Chloride ER 20 MEQ TBCR Take 20 mEq by mouth daily. Take only on days when taking lasix  . rosuvastatin (CRESTOR) 20 MG tablet Take 20 mg by mouth daily.  [name] saccharomyces boulardii (FLORASTOR) 250 MG capsule Take 1 capsule (250 mg total) by mouth 2 (two) times daily.  . Semaglutide (OZEMPIC) 0.25 or 0.5 MG/DOSE SOPN Inject 0.5 mg into the skin once a week. (Patient taking differently: Inject 1 mg into the skin once a week. )  . acetaminophen (TYLENOL) 325 MG tablet Take 2 tablets (650 mg total) by mouth every 6 (six) hours as needed for mild pain, fever or headache (or Fever >/= 101). (Patient not taking: Reported on 08/19/2019)  . albuterol (PROVENTIL HFA;VENTOLIN HFA) 108 (90 Base) MCG/ACT inhaler Inhale 1 puff into the lungs every 6 (six) hours as needed for wheezing or shortness of breath.  . Tiotropium Bromide-Olodaterol (STIOLTO RESPIMAT IN) Inhale 1 puff into the lungs daily as needed (shortness of breath).  . triamcinolone cream (KENALOG) 0.1 % Apply 1 application topically 2 (two) times daily. (  Patient not taking: Reported on 04/16/2019)  . valACYclovir (VALTREX) 1000 MG tablet Take 1,000 mg by mouth daily as needed (cold sores).    No  facility-administered encounter medications on file as of 08/19/2019.    ALLERGIES:  No Known Allergies   PHYSICAL EXAM:  ECOG Performance status: 1  Vitals:   08/19/19 1429  BP: (!) 147/75  Pulse: 97  Resp: 18  Temp: (!) 96.9 F (36.1 C)  SpO2: 100%   Filed Weights   08/19/19 1429  Weight: 195 lb 11.2 oz (88.8 kg)    Physical Exam Vitals reviewed.  Constitutional:      Appearance: Normal appearance.  Cardiovascular:     Rate and Rhythm: Normal rate and regular rhythm.     Heart sounds: Normal heart sounds.  Pulmonary:     Effort: Pulmonary effort is normal.     Breath sounds: Normal breath sounds.  Abdominal:     General: There is no distension.     Palpations: Abdomen is soft. There is no mass.  Musculoskeletal:        General: No swelling.  Skin:    General: Skin is warm.  Neurological:     General: No focal deficit present.     Mental Status: He is alert and oriented to person, place, and time.  Psychiatric:        Mood and Affect: Mood normal.        Behavior: Behavior normal.      LABORATORY DATA:  I have reviewed the labs as listed.  CBC    Component Value Date/Time   WBC 4.7 08/12/2019 1106   RBC 4.94 08/12/2019 1106   HGB 12.1 (L) 08/12/2019 1106   HGB 12.3 (L) 03/25/2019 1304   HCT 40.8 08/12/2019 1106   HCT 39.2 03/25/2019 1304   PLT 209 08/12/2019 1106   PLT 261 03/25/2019 1304   MCV 82.6 08/12/2019 1106   MCV 84 03/25/2019 1304   MCH 24.5 (L) 08/12/2019 1106   MCHC 29.7 (L) 08/12/2019 1106   RDW 17.2 (H) 08/12/2019 1106   RDW 16.7 (H) 03/25/2019 1304   LYMPHSABS 0.9 08/12/2019 1106   LYMPHSABS 1.4 03/25/2019 1304   MONOABS 0.6 08/12/2019 1106   EOSABS 0.3 08/12/2019 1106   EOSABS 0.0 03/25/2019 1304   BASOSABS 0.1 08/12/2019 1106   BASOSABS 0.0 03/25/2019 1304   CMP Latest Ref Rng & Units 08/12/2019 04/11/2019 03/18/2019  Glucose 70 - 99 mg/dL 382(H) 71 210(H)  BUN 8 - 23 mg/dL 14 19 15   Creatinine 0.61 - 1.24 mg/dL 0.83 0.94  0.82  Sodium 135 - 145 mmol/L 141 141 139  Potassium 3.5 - 5.1 mmol/L 4.2 4.0 4.7  Chloride 98 - 111 mmol/L 104 107 103  CO2 22 - 32 mmol/L 29 26 27   Calcium 8.9 - 10.3 mg/dL 9.3 9.1 9.0  Total Protein 6.5 - 8.1 g/dL 6.7 6.9 7.0  Total Bilirubin 0.3 - 1.2 mg/dL 0.7 0.6 0.6  Alkaline Phos 38 - 126 U/L 62 70 74  AST 15 - 41 U/L 16 16 18   ALT 0 - 44 U/L 14 12 23        DIAGNOSTIC IMAGING:  I have independently reviewed the scans and discussed with the patient.      ASSESSMENT & PLAN:   Pancytopenia ([place]) 1.  Pancytopenia: -Hospital admission from 03/09/2019 through 03/11/2019 with fevers, found to have severe pancytopenia.  Extensive work-up for nutritional deficiencies, infectious causes were negative. -Last blood transfusion in November  2017 when hemoglobin dropped to 6.6. -We reviewed his CBC.  White count improved to 4.7 with normal ANC.  Platelet count also normalized.  Slightly anemic which is stable. -No further work-up needed.  We will recheck his CBC in 6 months.  2.  Right lung cancer: -Underwent right lower lobectomy on 12/17/2014 showing 2.8 cm squamous cell carcinoma, pT1b, PN 0, margins negative, no visceral pleural invasion. -Bronchial washings for hemoptysis on 11/02/2017 showed malignant cells consistent with non-small cell lung cancer. -He underwent radiation from 12/18/2017 through 01/31/2018 for recurrent lung cancer versus new primary tracheal carcinoma. -Most recent CT of the chest without contrast on 06/04/2019 showed no evidence of recurrence or metastatic disease. -He has another scan scheduled in June.  3.  Diabetes: -His last hemoglobin A1c was around 9.  His NovoLog was increased to 45 units twice daily.  He is also on Ozempic weekly.      Orders placed this encounter:  Orders Placed This Encounter  Procedures  . CBC with Differential/Platelet  . Comprehensive metabolic panel      Derek Jack, MD [place] [phone]

## 2019-09-23 DIAGNOSIS — L821 Other seborrheic keratosis: Secondary | ICD-10-CM | POA: Diagnosis not present

## 2019-09-23 DIAGNOSIS — L570 Actinic keratosis: Secondary | ICD-10-CM | POA: Diagnosis not present

## 2019-09-23 DIAGNOSIS — D2261 Melanocytic nevi of right upper limb, including shoulder: Secondary | ICD-10-CM | POA: Diagnosis not present

## 2019-09-23 DIAGNOSIS — Z85820 Personal history of malignant melanoma of skin: Secondary | ICD-10-CM | POA: Diagnosis not present

## 2019-09-23 DIAGNOSIS — Z85828 Personal history of other malignant neoplasm of skin: Secondary | ICD-10-CM | POA: Diagnosis not present

## 2019-09-23 DIAGNOSIS — D1801 Hemangioma of skin and subcutaneous tissue: Secondary | ICD-10-CM | POA: Diagnosis not present

## 2019-09-23 DIAGNOSIS — H903 Sensorineural hearing loss, bilateral: Secondary | ICD-10-CM | POA: Diagnosis not present

## 2019-09-24 ENCOUNTER — Other Ambulatory Visit: Payer: Self-pay | Admitting: Family Medicine

## 2019-09-24 ENCOUNTER — Telehealth: Payer: Self-pay | Admitting: *Deleted

## 2019-09-24 NOTE — Telephone Encounter (Signed)
On 09/24/19 [facility]. Ok per Dr Sondra Come

## 2019-09-28 ENCOUNTER — Other Ambulatory Visit: Payer: Self-pay | Admitting: Family Medicine

## 2019-10-08 ENCOUNTER — Other Ambulatory Visit: Payer: Self-pay | Admitting: Family Medicine

## 2019-10-16 ENCOUNTER — Other Ambulatory Visit: Payer: Self-pay | Admitting: Family Medicine

## 2019-10-16 NOTE — Telephone Encounter (Signed)
Dettinger. NTBS 30 days given 09/24/19

## 2019-10-31 ENCOUNTER — Other Ambulatory Visit: Payer: Self-pay | Admitting: Family Medicine

## 2019-10-31 NOTE — Telephone Encounter (Signed)
Dettinger. NTBS 30 days given 10/08/19

## 2019-11-03 ENCOUNTER — Encounter: Payer: Self-pay | Admitting: Family Medicine

## 2019-11-03 ENCOUNTER — Other Ambulatory Visit: Payer: Self-pay | Admitting: Family Medicine

## 2019-11-03 ENCOUNTER — Other Ambulatory Visit: Payer: Self-pay

## 2019-11-03 ENCOUNTER — Ambulatory Visit (INDEPENDENT_AMBULATORY_CARE_PROVIDER_SITE_OTHER): Payer: Medicare HMO | Admitting: Family Medicine

## 2019-11-03 VITALS — BP 174/77 | HR 94 | Temp 97.6°F | Ht 72.0 in | Wt 198.2 lb

## 2019-11-03 DIAGNOSIS — E1159 Type 2 diabetes mellitus with other circulatory complications: Secondary | ICD-10-CM | POA: Diagnosis not present

## 2019-11-03 DIAGNOSIS — E039 Hypothyroidism, unspecified: Secondary | ICD-10-CM

## 2019-11-03 DIAGNOSIS — J449 Chronic obstructive pulmonary disease, unspecified: Secondary | ICD-10-CM

## 2019-11-03 DIAGNOSIS — I10 Essential (primary) hypertension: Secondary | ICD-10-CM

## 2019-11-03 DIAGNOSIS — I5022 Chronic systolic (congestive) heart failure: Secondary | ICD-10-CM | POA: Diagnosis not present

## 2019-11-03 LAB — BAYER DCA HB A1C WAIVED: HB A1C (BAYER DCA - WAIVED): 8.2 % — ABNORMAL HIGH (ref <7.0)

## 2019-11-03 MED ORDER — FUROSEMIDE 20 MG PO TABS: 20.0000 mg | ORAL_TABLET | Freq: Every day | ORAL | 3 refills | Status: AC

## 2019-11-03 MED ORDER — METOPROLOL SUCCINATE ER 200 MG PO TB24: 200.0000 mg | ORAL_TABLET | Freq: Every day | ORAL | 3 refills | Status: AC

## 2019-11-03 MED ORDER — POTASSIUM CHLORIDE ER 20 MEQ PO TBCR: 20.0000 meq | EXTENDED_RELEASE_TABLET | Freq: Every day | ORAL | 3 refills | Status: AC

## 2019-11-03 MED ORDER — METFORMIN HCL 1000 MG PO TABS: 1000.0000 mg | ORAL_TABLET | Freq: Two times a day (BID) | ORAL | 3 refills | Status: AC

## 2019-11-03 MED ORDER — OZEMPIC (1 MG/DOSE) 2 MG/1.5ML ~~LOC~~ SOPN: 1.0000 mg | PEN_INJECTOR | SUBCUTANEOUS | 3 refills | Status: AC

## 2019-11-03 MED ORDER — BREO ELLIPTA 200-25 MCG/INH IN AEPB: 1.0000 | INHALATION_SPRAY | Freq: Every day | RESPIRATORY_TRACT | 6 refills | Status: AC

## 2019-11-03 MED ORDER — LEVOTHYROXINE SODIUM 50 MCG PO TABS: 50.0000 ug | ORAL_TABLET | Freq: Every day | ORAL | 3 refills | Status: DC

## 2019-11-03 MED ORDER — PANTOPRAZOLE SODIUM 40 MG PO TBEC: 40.0000 mg | DELAYED_RELEASE_TABLET | Freq: Every day | ORAL | 3 refills | Status: DC

## 2019-11-03 NOTE — Progress Notes (Signed)
BP (!) 174/77   Pulse 94   Temp 97.6 F (36.4 C)   Ht 6' (1.829 m)   Wt 198 lb 4 oz (89.9 kg)   BMI 26.89 kg/m    Subjective:   Patient ID: Timothy Guerrero, male    DOB: October 10, 1940, 79 y.o.   MRN: [id number]  HPI: Timothy Guerrero is a 79 y.o. male presenting on 11/03/2019 for Medical Management of Chronic Issues, Diabetes (Sugars have been good per pt), and Hypertension (BP has been elevated per pt)   HPI Type 2 diabetes mellitus Patient comes in today for recheck of his diabetes. Patient has been currently taking NovoLog 70/30 and Ozempic and Metformin, 50 units a.m. and 50 units p.m., just changed a few days ago by the [place].  His A1c there was 8.3 just a few days ago.. Patient is currently on an ACE inhibitor/ARB. Patient has not seen an ophthalmologist this year. Patient denies any issues with their feet.  Patient has CHF.  Hypothyroidism recheck Patient is coming in for thyroid recheck today as well. They deny any issues with hair changes or heat or cold problems or diarrhea or constipation. They deny any chest pain or palpitations. They are currently on levothyroxine 50 micrograms   Hypertension and COPD and CHF Patient is currently on losartan and amlodipine and furosemide and metoprolol, and their blood pressure today is 174/77 and then 150/77. Patient denies any lightheadedness or dizziness. Patient denies headaches, blurred vision, chest pains, shortness of breath, or weakness. Denies any side effects from medication and is content with current medication.   Hyperlipidemia Patient is coming in for recheck of his hyperlipidemia. The patient is currently taking Crestor. They deny any issues with myalgias or history of liver damage from it. They deny any focal numbness or weakness or chest pain.   Relevant past medical, surgical, family and social history reviewed and updated as indicated. Interim medical history since our last visit reviewed. Allergies and medications  reviewed and updated.  Review of Systems  Constitutional: Negative for chills and fever.  Eyes: Negative for visual disturbance.  Respiratory: Negative for shortness of breath and wheezing.   Cardiovascular: Negative for chest pain and leg swelling.  Musculoskeletal: Negative for back pain and gait problem.  Skin: Negative for rash.  Neurological: Negative for dizziness, weakness and light-headedness.  All other systems reviewed and are negative.   Per HPI unless specifically indicated above   Allergies as of 11/03/2019   No Known Allergies     Medication List       Accurate as of Nov 03, 2019  2:00 PM. If you have any questions, ask your nurse or doctor.        acetaminophen 325 MG tablet Commonly known as: TYLENOL Take 2 tablets (650 mg total) by mouth every 6 (six) hours as needed for mild pain, fever or headache (or Fever >/= 101).   albuterol 108 (90 Base) MCG/ACT inhaler Commonly known as: VENTOLIN HFA Inhale 1 puff into the lungs every 6 (six) hours as needed for wheezing or shortness of breath.   amLODipine 10 MG tablet Commonly known as: NORVASC TAKE 1 TABLET BY MOUTH EVERY DAY   ASCORBIC ACID PO Take by mouth.   cholecalciferol 25 MCG (1000 UNIT) tablet Commonly known as: VITAMIN D3 Take 1,000 Units by mouth daily.   fluticasone furoate-vilanterol 200-25 MCG/INH Aepb Commonly known as: Breo Ellipta Inhale 1 puff into the lungs daily.   furosemide 20 MG  tablet Commonly known as: LASIX Take 1 tablet (20 mg total) by mouth daily.   gabapentin 300 MG capsule Commonly known as: NEURONTIN Take 300 mg by mouth 3 (three) times daily.   insulin aspart protamine- aspart (70-30) 100 UNIT/ML injection Commonly known as: NOVOLOG MIX 70/30 Inject 30-45 Units into the skin 2 (two) times daily.   levothyroxine 50 MCG tablet Commonly known as: SYNTHROID TAKE 1 TABLET (50 MCG TOTAL) BY MOUTH DAILY BEFORE BREAKFAST. ((FOR THYROID))   losartan 50 MG  tablet Commonly known as: COZAAR Take 25 mg by mouth daily.   magnesium oxide 400 MG tablet Commonly known as: MAG-OX Take 400 mg by mouth 2 (two) times daily.   metFORMIN 1000 MG tablet Commonly known as: GLUCOPHAGE Take 1 tablet (1,000 mg total) by mouth 2 (two) times daily with a meal. Needs to be seen before next refill.   metoprolol 200 MG 24 hr tablet Commonly known as: TOPROL-XL Take 1 tablet (200 mg total) by mouth daily.   mupirocin ointment 2 % Commonly known as: BACTROBAN APPLY TO AFFECTED AREA EVERY DAY   pantoprazole 40 MG tablet Commonly known as: PROTONIX Take 1 tablet (40 mg total) by mouth daily. (Needs to be seen before next refill)   Potassium Chloride ER 20 MEQ Tbcr Take 20 mEq by mouth daily. Take only on days when taking lasix   rosuvastatin 20 MG tablet Commonly known as: CRESTOR Take 20 mg by mouth daily.   saccharomyces boulardii 250 MG capsule Commonly known as: Florastor Take 1 capsule (250 mg total) by mouth 2 (two) times daily.   Semaglutide(0.25 or 0.5MG/DOS) 2 MG/1.5ML Sopn Commonly known as: Ozempic (0.25 or 0.5 MG/DOSE) Inject 0.5 mg into the skin once a week. What changed: how much to take   STIOLTO RESPIMAT IN Inhale 1 puff into the lungs daily as needed (shortness of breath).   triamcinolone cream 0.1 % Commonly known as: KENALOG Apply 1 application topically 2 (two) times daily.   Valtrex 1000 MG tablet Generic drug: valACYclovir Take 1,000 mg by mouth daily as needed (cold sores).   VITAMIN B-12 PO Take by mouth.        Objective:   BP (!) 174/77   Pulse 94   Temp 97.6 F (36.4 C)   Ht 6' (1.829 m)   Wt 198 lb 4 oz (89.9 kg)   BMI 26.89 kg/m   Wt Readings from Last 3 Encounters:  11/03/19 198 lb 4 oz (89.9 kg)  08/19/19 195 lb 11.2 oz (88.8 kg)  06/05/19 194 lb 6 oz (88.2 kg)    Physical Exam Vitals and nursing note reviewed.  Constitutional:      General: He is not in acute distress.    Appearance: He  is well-developed. He is not diaphoretic.  Eyes:     General: No scleral icterus.    Conjunctiva/sclera: Conjunctivae normal.  Neck:     Thyroid: No thyromegaly.  Cardiovascular:     Rate and Rhythm: Normal rate and regular rhythm.     Heart sounds: Normal heart sounds. No murmur.  Pulmonary:     Effort: Pulmonary effort is normal. No respiratory distress.     Breath sounds: Normal breath sounds. No wheezing.  Musculoskeletal:        General: Normal range of motion.     Cervical back: Neck supple.  Lymphadenopathy:     Cervical: No cervical adenopathy.  Skin:    General: Skin is warm and dry.       Findings: No rash.  Neurological:     Mental Status: He is alert and oriented to person, place, and time.     Coordination: Coordination normal.  Psychiatric:        Behavior: Behavior normal.       Assessment & Plan:   Problem List Items Addressed This Visit      Cardiovascular and Mediastinum   Essential hypertension   Relevant Medications   furosemide (LASIX) 20 MG tablet   metoprolol (TOPROL-XL) 200 MG 24 hr tablet   Other Relevant Orders   CMP14+EGFR   CBC with Differential/Platelet   Lipid panel   TSH   Chronic systolic HF (heart failure) (HCC)   Relevant Medications   furosemide (LASIX) 20 MG tablet   metoprolol (TOPROL-XL) 200 MG 24 hr tablet   Potassium Chloride ER 20 MEQ TBCR   Other Relevant Orders   CMP14+EGFR   CBC with Differential/Platelet   Lipid panel   TSH     Respiratory   COPD, moderate (HCC)   Relevant Medications   fluticasone furoate-vilanterol (BREO ELLIPTA) 200-25 MCG/INH AEPB   Other Relevant Orders   CMP14+EGFR   CBC with Differential/Platelet   Lipid panel   TSH     Endocrine   Diabetes mellitus (HCC) - Primary   Relevant Medications   metFORMIN (GLUCOPHAGE) 1000 MG tablet   Semaglutide, 1 MG/DOSE, (OZEMPIC, 1 MG/DOSE,) 2 MG/1.5ML SOPN   Other Relevant Orders   Bayer DCA Hb A1c Waived (Completed)   CMP14+EGFR   CBC with  Differential/Platelet   Lipid panel   TSH   Hypothyroidism   Relevant Medications   levothyroxine (SYNTHROID) 50 MCG tablet   metoprolol (TOPROL-XL) 200 MG 24 hr tablet   Other Relevant Orders   CMP14+EGFR   CBC with Differential/Platelet   Lipid panel   TSH      Continue patient current medication.  No major changes, did some refills for the patient, [facility] and [facility] is already made adjustments by increasing the 70/30 insulin so we will not make any further adjustments today, 8.2 A1c today. Follow up plan: Return in about 6 months (around 05/05/2020) for Recheck diabetes hypertension and COPD.  Counseling provided for all of the vaccine components Orders Placed This Encounter  Procedures  . Bayer DCA Hb A1c Waived    Joshua Dettinger, MD [facility] 11/03/2019, 2:00 PM     

## 2019-11-03 NOTE — Telephone Encounter (Signed)
Appointment scheduled for 5/3.

## 2019-11-04 LAB — CBC WITH DIFFERENTIAL/PLATELET
Basophils Absolute: 0.1 10*3/uL (ref 0.0–0.2)
Basos: 1 %
EOS (ABSOLUTE): 0.1 10*3/uL (ref 0.0–0.4)
Eos: 2 %
Hematocrit: 39.3 % (ref 37.5–51.0)
Hemoglobin: 11.9 g/dL — ABNORMAL LOW (ref 13.0–17.7)
Immature Grans (Abs): 0 10*3/uL (ref 0.0–0.1)
Immature Granulocytes: 0 %
Lymphocytes Absolute: 0.9 10*3/uL (ref 0.7–3.1)
Lymphs: 13 %
MCH: 25.2 pg — ABNORMAL LOW (ref 26.6–33.0)
MCHC: 30.3 g/dL — ABNORMAL LOW (ref 31.5–35.7)
MCV: 83 fL (ref 79–97)
Monocytes Absolute: 0.8 10*3/uL (ref 0.1–0.9)
Monocytes: 11 %
Neutrophils Absolute: 5.1 10*3/uL (ref 1.4–7.0)
Neutrophils: 73 %
Platelets: 304 10*3/uL (ref 150–450)
RBC: 4.73 x10E6/uL (ref 4.14–5.80)
RDW: 16.6 % — ABNORMAL HIGH (ref 11.6–15.4)
WBC: 6.9 10*3/uL (ref 3.4–10.8)

## 2019-11-04 LAB — CMP14+EGFR
ALT: 12 IU/L (ref 0–44)
AST: 21 IU/L (ref 0–40)
Albumin/Globulin Ratio: 1.5 (ref 1.2–2.2)
Albumin: 3.9 g/dL (ref 3.7–4.7)
Alkaline Phosphatase: 76 IU/L (ref 39–117)
BUN/Creatinine Ratio: 16 (ref 10–24)
BUN: 16 mg/dL (ref 8–27)
Bilirubin Total: 0.6 mg/dL (ref 0.0–1.2)
CO2: 23 mmol/L (ref 20–29)
Calcium: 9.7 mg/dL (ref 8.6–10.2)
Chloride: 105 mmol/L (ref 96–106)
Creatinine, Ser: 1 mg/dL (ref 0.76–1.27)
GFR calc Af Amer: 83 mL/min/{1.73_m2} (ref >59)
GFR calc non Af Amer: 72 mL/min/{1.73_m2} (ref >59)
Globulin, Total: 2.6 g/dL (ref 1.5–4.5)
Glucose: 229 mg/dL — ABNORMAL HIGH (ref 65–99)
Potassium: 4.3 mmol/L (ref 3.5–5.2)
Sodium: 144 mmol/L (ref 134–144)
Total Protein: 6.5 g/dL (ref 6.0–8.5)

## 2019-11-04 LAB — LIPID PANEL
Chol/HDL Ratio: 3.8 ratio (ref 0.0–5.0)
Cholesterol, Total: 176 mg/dL (ref 100–199)
HDL: 46 mg/dL (ref >39)
LDL Chol Calc (NIH): 106 mg/dL — ABNORMAL HIGH (ref 0–99)
Triglycerides: 132 mg/dL (ref 0–149)
VLDL Cholesterol Cal: 24 mg/dL (ref 5–40)

## 2019-11-04 LAB — TSH: TSH: 1.7 u[IU]/mL (ref 0.450–4.500)

## 2019-12-05 ENCOUNTER — Telehealth: Payer: Self-pay | Admitting: *Deleted

## 2019-12-05 NOTE — Telephone Encounter (Signed)
Called patient to inform that he doesn't need to come for fu on 12-08-19, due to scan not being done, scan and fu will be rescheduled, once scan has been pre-auth, patient verified understanding this

## 2019-12-08 ENCOUNTER — Ambulatory Visit: Payer: No Typology Code available for payment source | Admitting: Radiation Oncology

## 2019-12-29 DIAGNOSIS — L986 Other infiltrative disorders of the skin and subcutaneous tissue: Secondary | ICD-10-CM | POA: Diagnosis not present

## 2019-12-29 DIAGNOSIS — D485 Neoplasm of uncertain behavior of skin: Secondary | ICD-10-CM | POA: Diagnosis not present

## 2019-12-29 DIAGNOSIS — C44311 Basal cell carcinoma of skin of nose: Secondary | ICD-10-CM | POA: Diagnosis not present

## 2019-12-29 DIAGNOSIS — L570 Actinic keratosis: Secondary | ICD-10-CM | POA: Diagnosis not present

## 2019-12-29 DIAGNOSIS — D1801 Hemangioma of skin and subcutaneous tissue: Secondary | ICD-10-CM | POA: Diagnosis not present

## 2019-12-29 DIAGNOSIS — Z85820 Personal history of malignant melanoma of skin: Secondary | ICD-10-CM | POA: Diagnosis not present

## 2019-12-29 DIAGNOSIS — Z85828 Personal history of other malignant neoplasm of skin: Secondary | ICD-10-CM | POA: Diagnosis not present

## 2019-12-29 DIAGNOSIS — L821 Other seborrheic keratosis: Secondary | ICD-10-CM | POA: Diagnosis not present

## 2019-12-29 DIAGNOSIS — D2272 Melanocytic nevi of left lower limb, including hip: Secondary | ICD-10-CM | POA: Diagnosis not present

## 2020-01-15 ENCOUNTER — Telehealth: Payer: Self-pay | Admitting: *Deleted

## 2020-01-15 NOTE — Telephone Encounter (Signed)
CALLED PATIENT TO INFORM OF CT FOR 01-21-20 - ARRIVAL TIME- 6:45 PM @ [facility], NO RESTRICTIONS TO TEST, PATIENT TO RECEIVE CT RESULTS FROM 01-22-20 @ 10:30 AM FROM DR. KINARD, LVM FOR A RETURN CALL

## 2020-01-20 ENCOUNTER — Telehealth: Payer: Self-pay | Admitting: *Deleted

## 2020-01-20 NOTE — Telephone Encounter (Signed)
CALLED  PATIENT TO INFORM OF CT FOR 01-21-20 - ARRIVAL TIME- 6:45 PM  @ [facility], NO RESTRICTIONS TO TEST, PATIENT TO RECEIVE RESULTS FROM DR. KINARD ON 01-22-20 @ 10:30 AM, LVM ON VM PER PATIENT REQUEST

## 2020-01-20 NOTE — Progress Notes (Signed)
Radiation Oncology         [phone]) [phone] ________________________________  Name: Timothy Guerrero MRN: [id number]  Date: 01/22/2020  DOB: 06-Feb-1941  Follow-Up Visit Note  CC: Dettinger, Fransisca Kaufmann, MD  Melrose Nakayama, *    ICD-10-CM   1. Squamous cell carcinoma of right lung (HCC)  C34.91 CT Chest Wo Contrast  2. 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  Radiation treatment dates:12/18/2017 - 01/31/2018  Site/dose: 1. The Chest was treated to 46 Gy in 23 fractions of 2 Gy. 2. The Chest was boosted to 20 Gy in 10 fractions of 2 Gy.(66 Gy)  Narrative:  The patient returns today for routine follow-up. He is doing well overall. He was last seen by Dr. Delton Coombes on 08/19/2019, during which time he remained under observation.   Chest CT scan performed yesterday, 01/21/2020, did not show any evidence of local tumor recurrence status post right lower lobectomy. There was no evidence of metastatic disease in the chest.  On review of systems, he reports pain in his right shoulder.  He has been receiving injections for this issue.  Thus far of these injections have not helped his pain.  ALLERGIES:  has No Known Allergies.  Meds: Current Outpatient Medications  Medication Sig Dispense Refill  . acetaminophen (TYLENOL) 325 MG tablet Take 2 tablets (650 mg total) by mouth every 6 (six) hours as needed for mild pain, fever or headache (or Fever >/= 101). (Patient not taking: Reported on 08/19/2019) 12 tablet 1  . albuterol (PROVENTIL HFA;VENTOLIN HFA) 108 (90 Base) MCG/ACT inhaler Inhale 1 puff into the lungs every 6 (six) hours as needed for wheezing or shortness of breath.    [name] amLODipine (NORVASC) 10 MG tablet TAKE 1 TABLET BY MOUTH EVERY DAY 90 tablet 3  . ASCORBIC ACID PO Take by mouth.    . cholecalciferol  (VITAMIN D3) 25 MCG (1000 UT) tablet Take 1,000 Units by mouth daily.    . clonazePAM (KLONOPIN) 0.5 MG tablet Take 1 tablet (0.5 mg total) by mouth once for 1 dose. 1 tablet 0  . Cyanocobalamin (VITAMIN B-12 PO) Take by mouth.    . fluticasone furoate-vilanterol (BREO ELLIPTA) 200-25 MCG/INH AEPB Inhale 1 puff into the lungs daily. 1 each 6  . furosemide (LASIX) 20 MG tablet Take 1 tablet (20 mg total) by mouth daily. 90 tablet 3  . gabapentin (NEURONTIN) 300 MG capsule Take 300 mg by mouth 3 (three) times daily.    . insulin aspart protamine- aspart (NOVOLOG MIX 70/30) (70-30) 100 UNIT/ML injection Inject 30-45 Units into the skin 2 (two) times daily.     [name] levothyroxine (SYNTHROID) 50 MCG tablet Take 1 tablet (50 mcg total) by mouth daily before breakfast. ((for thyroid)) 90 tablet 3  . losartan (COZAAR) 50 MG tablet Take 25 mg by mouth daily.    . magnesium oxide (MAG-OX) 400 MG tablet Take 400 mg by mouth 2 (two) times daily.    . metFORMIN (GLUCOPHAGE) 1000 MG tablet Take 1 tablet (1,000 mg total) by mouth 2 (two) times daily with a meal. Needs to be seen before next refill. 180 tablet 3  . metoprolol (TOPROL-XL) 200 MG 24 hr tablet Take 1 tablet (200 mg total) by mouth daily. 90 tablet 3  . mupirocin ointment (BACTROBAN) 2 % APPLY TO AFFECTED  AREA EVERY DAY    . pantoprazole (PROTONIX) 40 MG tablet Take 1 tablet (40 mg total) by mouth daily. 90 tablet 3  . Potassium Chloride ER 20 MEQ TBCR Take 20 mEq by mouth daily. Take only on days when taking lasix 90 tablet 3  . rosuvastatin (CRESTOR) 20 MG tablet Take 20 mg by mouth daily.    [name] saccharomyces boulardii (FLORASTOR) 250 MG capsule Take 1 capsule (250 mg total) by mouth 2 (two) times daily. 60 capsule 1  . Semaglutide, 1 MG/DOSE, (OZEMPIC, 1 MG/DOSE,) 2 MG/1.5ML SOPN Inject 1 mg into the skin once a week. 1 pen 3  . Tiotropium Bromide-Olodaterol (STIOLTO RESPIMAT IN) Inhale 1 puff into the lungs daily as needed (shortness of breath).    .  triamcinolone cream (KENALOG) 0.1 % Apply 1 application topically 2 (two) times daily. (Patient not taking: Reported on 04/16/2019) 30 g 0  . valACYclovir (VALTREX) 1000 MG tablet Take 1,000 mg by mouth daily as needed (cold sores).      No current facility-administered medications for this encounter.    Physical Findings: The patient is in no acute distress. Patient is alert and oriented.  weight is 199 lb 9.6 oz (90.5 kg). His temperature is 98.2 F (36.8 C). His blood pressure is 160/80 (abnormal) and his pulse is 99. His respiration is 20 and oxygen saturation is 97%.   Lungs are clear to auscultation bilaterally, no wheezing. Heart has regular rate and rhythm. No palpable cervical, supraclavicular, or axillary adenopathy. Abdomen soft, non-tender, normal bowel sounds.     Lab Findings: Lab Results  Component Value Date   WBC 6.9 11/03/2019   HGB 11.9 (L) 11/03/2019   HCT 39.3 11/03/2019   MCV 83 11/03/2019   PLT 304 11/03/2019    Radiographic Findings: CT Chest Wo Contrast  Result Date: 01/22/2020 CLINICAL DATA:  Non-small cell stage IA right lower lobe lung cancer status post right lower lobectomy June 2016 and radiation therapy. Additional history of melanoma and prostate cancer. Restaging. EXAM: CT CHEST WITHOUT CONTRAST TECHNIQUE: Multidetector CT imaging of the chest was performed following the standard protocol without IV contrast. COMPARISON:  06/04/2019 chest CT. FINDINGS: Cardiovascular: Normal heart size. No significant pericardial effusion/thickening. Three-vessel coronary atherosclerosis. Atherosclerotic nonaneurysmal thoracic aorta. Normal caliber pulmonary arteries. Mediastinum/Nodes: No discrete thyroid nodules. Unremarkable esophagus. No pathologically enlarged axillary, mediastinal or hilar lymph nodes, noting limited sensitivity for the detection of hilar adenopathy on this noncontrast study. Lungs/Pleura: No pneumothorax. Calcified bilateral pleural plaques,  unchanged. No pleural effusions. Status post right lower lobectomy. Mild centrilobular and paraseptal emphysema. Stable calcified subcentimeter lingular granuloma. Subpleural 4 mm peripheral left lower lobe pulmonary nodule (series 5/image 109) is stable since at least 11/20/2018 CT and considered benign. No acute consolidative airspace disease or new significant pulmonary nodules. Upper abdomen: Stable 1.6 cm right adrenal nodule with density 9 HU, compatible with a benign adenoma. Simple 4.2 cm upper right renal cyst. Musculoskeletal: No aggressive appearing focal osseous lesions. Left shoulder arthroplasty. Moderate thoracic spondylosis. IMPRESSION: 1. No evidence of local tumor recurrence status post right lower lobectomy. 2. No evidence of metastatic disease in the chest. 3. Three-vessel coronary atherosclerosis. 4. Stable right adrenal adenoma. 5. Aortic Atherosclerosis (ICD10-I70.0) and Emphysema (ICD10-J43.9). Electronically Signed   By: Ilona Sorrel M.D.   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   No evidence of recurrence on clinical exam today. Chest CT scan from yesterday  did not show any evidence of of local tumor recurrence or metastatic disease.  Plan: The patient is scheduled to follow-up with Dr. Delton Coombes on 02/17/2020. He will follow-up with radiation oncology in six months with a chest CT scan to be done prior to that visit.  Total time spent in this encounter was 20 minutes which included reviewing the patient's most recent follow-up, chest CT scan, physical examination, documentation, and ordering of future chest CT scan.  ____________________________________  Blair Promise, PhD, MD  This document serves as a record of services personally performed by Gery Pray, MD. It was created on his behalf by Clerance Lav, a trained medical scribe. The creation of this record is based on the scribe's  personal observations and the provider's statements to them. This document has been checked and approved by the attending provider.

## 2020-01-21 ENCOUNTER — Other Ambulatory Visit: Payer: Self-pay

## 2020-01-21 ENCOUNTER — Ambulatory Visit (HOSPITAL_COMMUNITY)
Admission: RE | Admit: 2020-01-21 | Discharge: 2020-01-21 | Disposition: A | Discharge status: Home / Self Care | Payer: Medicare HMO | Source: Ambulatory Visit | Attending: Radiation Oncology | Admitting: Radiation Oncology

## 2020-01-21 ENCOUNTER — Telehealth: Payer: Self-pay | Admitting: Family Medicine

## 2020-01-21 DIAGNOSIS — C3491 Malignant neoplasm of unspecified part of right bronchus or lung: Secondary | ICD-10-CM

## 2020-01-21 DIAGNOSIS — I700 Atherosclerosis of aorta: Secondary | ICD-10-CM | POA: Diagnosis not present

## 2020-01-21 DIAGNOSIS — I2510 Atherosclerotic heart disease of native coronary artery without angina pectoris: Secondary | ICD-10-CM | POA: Diagnosis not present

## 2020-01-21 DIAGNOSIS — C3431 Malignant neoplasm of lower lobe, right bronchus or lung: Secondary | ICD-10-CM | POA: Diagnosis not present

## 2020-01-21 DIAGNOSIS — J432 Centrilobular emphysema: Secondary | ICD-10-CM | POA: Diagnosis not present

## 2020-01-21 MED ORDER — CLONAZEPAM 0.5 MG PO TABS
0.5000 mg | ORAL_TABLET | Freq: Once | ORAL | 0 refills | Status: DC
Start: 2020-01-21 — End: 2020-03-10

## 2020-01-21 NOTE — Telephone Encounter (Signed)
Patient aware and verbalized understanding. °

## 2020-01-21 NOTE — Telephone Encounter (Signed)
Sent 1 dose of Klonopin that he can take prior to the exam but make sure he has somebody else drive him.

## 2020-01-22 ENCOUNTER — Ambulatory Visit
Admission: RE | Admit: 2020-01-22 | Discharge: 2020-01-22 | Disposition: A | Discharge status: Home / Self Care | Payer: Medicare HMO | Source: Ambulatory Visit | Attending: Radiation Oncology | Admitting: Radiation Oncology

## 2020-01-22 ENCOUNTER — Other Ambulatory Visit: Payer: Self-pay

## 2020-01-22 ENCOUNTER — Encounter: Payer: Self-pay | Admitting: Radiation Oncology

## 2020-01-22 VITALS — BP 160/80 | HR 99 | Temp 98.2°F | Resp 20 | Wt 199.6 lb

## 2020-01-22 DIAGNOSIS — Z85820 Personal history of malignant melanoma of skin: Secondary | ICD-10-CM | POA: Insufficient documentation

## 2020-01-22 DIAGNOSIS — J439 Emphysema, unspecified: Secondary | ICD-10-CM | POA: Diagnosis not present

## 2020-01-22 DIAGNOSIS — Z923 Personal history of irradiation: Secondary | ICD-10-CM | POA: Diagnosis not present

## 2020-01-22 DIAGNOSIS — Z8546 Personal history of malignant neoplasm of prostate: Secondary | ICD-10-CM | POA: Diagnosis not present

## 2020-01-22 DIAGNOSIS — I700 Atherosclerosis of aorta: Secondary | ICD-10-CM | POA: Insufficient documentation

## 2020-01-22 DIAGNOSIS — Z79899 Other long term (current) drug therapy: Secondary | ICD-10-CM | POA: Insufficient documentation

## 2020-01-22 DIAGNOSIS — C3491 Malignant neoplasm of unspecified part of right bronchus or lung: Secondary | ICD-10-CM

## 2020-01-22 DIAGNOSIS — Z85118 Personal history of other malignant neoplasm of bronchus and lung: Secondary | ICD-10-CM | POA: Insufficient documentation

## 2020-01-22 DIAGNOSIS — Z08 Encounter for follow-up examination after completed treatment for malignant neoplasm: Secondary | ICD-10-CM | POA: Diagnosis not present

## 2020-01-22 NOTE — Progress Notes (Addendum)
Patient here for his chest CT results from 7/21/20201. Patient finished radiation in 02/19/2020. Patient denies any problems at this time.  BP (!) 160/80   Pulse 99   Temp 98.2 F (36.8 C)   Resp 20   Wt 199 lb 9.6 oz (90.5 kg)   SpO2 97%   BMI 27.07 kg/m    Wt Readings from Last 3 Encounters:  01/22/20 199 lb 9.6 oz (90.5 kg)  11/03/19 198 lb 4 oz (89.9 kg)  08/19/19 195 lb 11.2 oz (88.8 kg)   Patient reports 2 Melanomas on his face that will be removed soon.

## 2020-01-28 DIAGNOSIS — C44311 Basal cell carcinoma of skin of nose: Secondary | ICD-10-CM | POA: Diagnosis not present

## 2020-01-28 DIAGNOSIS — Z85820 Personal history of malignant melanoma of skin: Secondary | ICD-10-CM | POA: Diagnosis not present

## 2020-01-28 DIAGNOSIS — Z85828 Personal history of other malignant neoplasm of skin: Secondary | ICD-10-CM | POA: Diagnosis not present

## 2020-02-05 DIAGNOSIS — Z85820 Personal history of malignant melanoma of skin: Secondary | ICD-10-CM | POA: Diagnosis not present

## 2020-02-05 DIAGNOSIS — D485 Neoplasm of uncertain behavior of skin: Secondary | ICD-10-CM | POA: Diagnosis not present

## 2020-02-05 DIAGNOSIS — Z85828 Personal history of other malignant neoplasm of skin: Secondary | ICD-10-CM | POA: Diagnosis not present

## 2020-02-06 ENCOUNTER — Inpatient Hospital Stay (HOSPITAL_COMMUNITY): Payer: Medicare HMO | Attending: Hematology

## 2020-02-06 ENCOUNTER — Other Ambulatory Visit: Payer: Self-pay

## 2020-02-06 DIAGNOSIS — Z87891 Personal history of nicotine dependence: Secondary | ICD-10-CM | POA: Diagnosis not present

## 2020-02-06 DIAGNOSIS — Z794 Long term (current) use of insulin: Secondary | ICD-10-CM | POA: Diagnosis not present

## 2020-02-06 DIAGNOSIS — I10 Essential (primary) hypertension: Secondary | ICD-10-CM | POA: Insufficient documentation

## 2020-02-06 DIAGNOSIS — Z79899 Other long term (current) drug therapy: Secondary | ICD-10-CM | POA: Diagnosis not present

## 2020-02-06 DIAGNOSIS — Z8546 Personal history of malignant neoplasm of prostate: Secondary | ICD-10-CM | POA: Insufficient documentation

## 2020-02-06 DIAGNOSIS — Z85118 Personal history of other malignant neoplasm of bronchus and lung: Secondary | ICD-10-CM | POA: Insufficient documentation

## 2020-02-06 DIAGNOSIS — E119 Type 2 diabetes mellitus without complications: Secondary | ICD-10-CM | POA: Diagnosis not present

## 2020-02-06 DIAGNOSIS — D61818 Other pancytopenia: Secondary | ICD-10-CM | POA: Insufficient documentation

## 2020-02-06 LAB — CBC WITH DIFFERENTIAL/PLATELET
Abs Immature Granulocytes: 0.02 10*3/uL (ref 0.00–0.07)
Basophils Absolute: 0.1 10*3/uL (ref 0.0–0.1)
Basophils Relative: 1 %
Eosinophils Absolute: 0.3 10*3/uL (ref 0.0–0.5)
Eosinophils Relative: 4 %
HCT: 40.9 % (ref 39.0–52.0)
Hemoglobin: 11.8 g/dL — ABNORMAL LOW (ref 13.0–17.0)
Immature Granulocytes: 0 %
Lymphocytes Relative: 19 %
Lymphs Abs: 1.2 10*3/uL (ref 0.7–4.0)
MCH: 23.9 pg — ABNORMAL LOW (ref 26.0–34.0)
MCHC: 28.9 g/dL — ABNORMAL LOW (ref 30.0–36.0)
MCV: 82.8 fL (ref 80.0–100.0)
Monocytes Absolute: 0.9 10*3/uL (ref 0.1–1.0)
Monocytes Relative: 14 %
Neutro Abs: 3.9 10*3/uL (ref 1.7–7.7)
Neutrophils Relative %: 62 %
Platelets: 281 10*3/uL (ref 150–400)
RBC: 4.94 MIL/uL (ref 4.22–5.81)
RDW: 18.6 % — ABNORMAL HIGH (ref 11.5–15.5)
WBC: 6.3 10*3/uL (ref 4.0–10.5)
nRBC: 0 % (ref 0.0–0.2)

## 2020-02-06 LAB — COMPREHENSIVE METABOLIC PANEL
ALT: 13 U/L (ref 0–44)
AST: 15 U/L (ref 15–41)
Albumin: 4.1 g/dL (ref 3.5–5.0)
Alkaline Phosphatase: 62 U/L (ref 38–126)
Anion gap: 9 (ref 5–15)
BUN: 14 mg/dL (ref 8–23)
CO2: 27 mmol/L (ref 22–32)
Calcium: 9.5 mg/dL (ref 8.9–10.3)
Chloride: 107 mmol/L (ref 98–111)
Creatinine, Ser: 0.76 mg/dL (ref 0.61–1.24)
GFR calc Af Amer: >60 mL/min (ref >60)
GFR calc non Af Amer: >60 mL/min (ref >60)
Glucose, Bld: 71 mg/dL (ref 70–99)
Potassium: 3.6 mmol/L (ref 3.5–5.1)
Sodium: 143 mmol/L (ref 135–145)
Total Bilirubin: 0.5 mg/dL (ref 0.3–1.2)
Total Protein: 6.9 g/dL (ref 6.5–8.1)

## 2020-02-10 ENCOUNTER — Other Ambulatory Visit (HOSPITAL_COMMUNITY): Payer: No Typology Code available for payment source

## 2020-02-17 ENCOUNTER — Other Ambulatory Visit: Payer: Self-pay

## 2020-02-17 ENCOUNTER — Inpatient Hospital Stay (HOSPITAL_BASED_OUTPATIENT_CLINIC_OR_DEPARTMENT_OTHER): Payer: Medicare HMO | Admitting: Hematology

## 2020-02-17 VITALS — BP 154/70 | HR 98 | Temp 97.8°F | Resp 18 | Wt 201.3 lb

## 2020-02-17 DIAGNOSIS — C3491 Malignant neoplasm of unspecified part of right bronchus or lung: Secondary | ICD-10-CM | POA: Diagnosis not present

## 2020-02-17 DIAGNOSIS — D61818 Other pancytopenia: Secondary | ICD-10-CM

## 2020-02-17 NOTE — Progress Notes (Signed)
[place] [place], [place] [address]   CLINIC:  Medical Oncology/Hematology  PCP:  Guerrero, Timothy Kaufmann, MD [place] / [place] [address]  [phone]  REASON FOR VISIT:  Follow-up for pancytopenia  PRIOR THERAPY: Blood transfusion in 05/2016  CURRENT THERAPY: Observation  INTERVAL HISTORY:  [name], a 79 y.o. male, returns for routine follow-up for his pancytopenia. Timothy Guerrero was last seen on 08/19/2019.  Today he reports feeling well. His cough is stable and he denies having chest pain, recent infections, F/C, or night sweats. His ventral hernia retracts when he lies down and he denies having any issues with it.   REVIEW OF SYSTEMS:  Review of Systems  Constitutional: Positive for fatigue (moderate). Negative for appetite change, chills, diaphoresis and fever.  Respiratory: Positive for shortness of breath.   Cardiovascular: Negative for chest pain.  Hematological: Bruises/bleeds easily (easy bruising).  All other systems reviewed and are negative.   PAST MEDICAL/SURGICAL HISTORY:  Past Medical History:  Diagnosis Date   Anemia    Arthritis    Atrial flutter ([place])    post op following lobectomy   Benign essential tremor    Blood transfusion without reported diagnosis    Cataract    CHF (congestive heart failure) ([place])    Colon polyp    2007, polyp with early cancer, one year f/u no residual polyp. next TCS 2010, see P[facility]. [facility].   COPD (chronic obstructive pulmonary disease) (HCC)    Diabetes mellitus without complication (HCC)    10 years   GERD (gastroesophageal reflux disease)    HOH (hard of hearing)    Hypercholesteremia    Hypertension    10 years   Lung cancer ([place])    Prostate cancer (HCC)    Shortness of breath dyspnea    due to lung mass   Skin melanoma ([place])    Sleep apnea    wears CPAP sometimes, cannot tolerate all the time.   Past Surgical History:    Procedure Laterality Date   bottom lobe of right lung removed  12/17/14   CARDIAC CATHETERIZATION     no PCI   CATARACT EXTRACTION Left    CATARACT EXTRACTION W/PHACO Left 01/03/2016   Procedure: CATARACT EXTRACTION PHACO AND INTRAOCULAR LENS PLACEMENT LEFT EYE CDE=7.78;  Surgeon: Williams Che, MD;  Location: [facility];  Service: Ophthalmology;  Laterality: Left;   CATARACT EXTRACTION W/PHACO Right 03/20/2016   Procedure: CATARACT EXTRACTION PHACO AND INTRAOCULAR LENS PLACEMENT; CDE:  9.58;  Surgeon: Williams Che, MD;  Location: [facility];  Service: Ophthalmology;  Laterality: Right;   COLONOSCOPY  08/2008   Dr. Leafy Half, [place]: normal, internal grade 1 hemorrhoids. next TCS 08/2013   COLONOSCOPY N/A 03/04/2014   Procedure: COLONOSCOPY;  Surgeon: Daneil Dolin, MD;  Location: [facility];  Service: Endoscopy;  Laterality: N/A;  7:30am   EYE SURGERY     knee replacements     bilateral. over 10 years ago   LOBECTOMY Right 12/17/2014   Procedure: RIGHT LOWER LOBECTOMY;  Surgeon: Melrose Nakayama, MD;  Location: [facility];  Service: Thoracic;  Laterality: Right;   LYMPH NODE DISSECTION Right 12/17/2014   Procedure: LYMPH NODE DISSECTION;  Surgeon: Melrose Nakayama, MD;  Location: [facility];  Service: Thoracic;  Laterality: Right;   NECK SURGERY     melanoma removed.   PROSTATE SURGERY  prostatectomy, radical   TOTAL HIP ARTHROPLASTY  2007   left   total shoulder replacement  2011   left   VIDEO ASSISTED THORACOSCOPY (VATS)/WEDGE RESECTION Right 12/17/2014   Procedure: RIGHT VIDEO ASSISTED THORACOSCOPY (VATS);  Surgeon: Melrose Nakayama, MD;  Location: [facility];  Service: Thoracic;  Laterality: Right;   VIDEO BRONCHOSCOPY Bilateral 11/17/2016   Procedure: VIDEO BRONCHOSCOPY WITHOUT FLUORO;  Surgeon: Javier Glazier, MD;  Location: [facility];  Service: Cardiopulmonary;  Laterality: Bilateral;   VIDEO BRONCHOSCOPY N/A 11/01/2017   Procedure:  VIDEO BRONCHOSCOPY;  Surgeon: Melrose Nakayama, MD;  Location: [facility];  Service: Thoracic;  Laterality: N/A;    SOCIAL HISTORY:  Social History   Socioeconomic History   Marital status: Divorced    Spouse name: Not on file   Number of children: 3   Years of education: associates   Highest education level: Associate degree: occupational, Hotel manager, or vocational program  Occupational History   Occupation: retired    Comment: truck driver  Tobacco Use   Smoking status: Former Smoker    Packs/day: 2.00    Years: 32.00    Pack years: 64.00    Types: Cigarettes    Quit date: 07/03/1986    Years since quitting: 33.6   Smokeless tobacco: Never Used  Vaping Use   Vaping Use: Never used  Substance and Sexual Activity   Alcohol use: Yes    Alcohol/week: 0.0 standard drinks    Comment: rarely wine   Drug use: No   Sexual activity: Yes  Other Topics Concern   Not on file  Social History Narrative   [employer].  Lives alone.      [facility]:   Originally from [facility]. Served in [employer] with significant exposure to asbestos. 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 He has also lived in [place], [place], & [place]. He has lived in [place] since 1979. 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 No pets currently. Remote bird exposure.       Patient is divorced but has had a girlfriend for 7 months. He enjoys going dancing several times a week. He had 3 children but one passed away at 62 from prostate cancer. He has 17 grandchildren.    Social Determinants of Health   Financial Resource Strain:    Difficulty of Paying Living Expenses:   Food Insecurity:    Worried About Charity fundraiser in the Last Year:    Arboriculturist in the Last Year:   Transportation Needs:    Film/video editor (Medical):    Lack of Transportation (Non-Medical):   Physical Activity:    Days of Exercise per Week:    Minutes of Exercise per Session:   Stress: No Stress Concern Present   Feeling of  Stress : Not at all  Social Connections:    Frequency of Communication with Friends and Family:    Frequency of Social Gatherings with Friends and Family:    Attends Religious Services:    Active Member of Clubs or Organizations:    Attends Music therapist:    Marital Status:   Intimate Partner Violence:    Fear of Current or Ex-Partner:    Emotionally Abused:    Physically Abused:    Sexually Abused:     FAMILY HISTORY:  Family History  Problem Relation Age of Onset   Cancer Mother        unknown type  COPD Sister    Breast cancer Sister    Cancer Sister 40       breast   Heart attack Daughter    Prostate cancer Son    Colon cancer Neg Hx     CURRENT MEDICATIONS:  Current Outpatient Medications  Medication Sig Dispense Refill   acetaminophen (TYLENOL) 325 MG tablet Take 2 tablets (650 mg total) by mouth every 6 (six) hours as needed for mild pain, fever or headache (or Fever >/= 101). (Patient not taking: Reported on 08/19/2019) 12 tablet 1   albuterol (PROVENTIL HFA;VENTOLIN HFA) 108 (90 Base) MCG/ACT inhaler Inhale 1 puff into the lungs every 6 (six) hours as needed for wheezing or shortness of breath.     amLODipine (NORVASC) 10 MG tablet TAKE 1 TABLET BY MOUTH EVERY DAY 90 tablet 3   ASCORBIC ACID PO Take by mouth.     cholecalciferol (VITAMIN D3) 25 MCG (1000 UT) tablet Take 1,000 Units by mouth daily.     clonazePAM (KLONOPIN) 0.5 MG tablet Take 1 tablet (0.5 mg total) by mouth once for 1 dose. 1 tablet 0   Cyanocobalamin (VITAMIN B-12 PO) Take by mouth.     fluticasone furoate-vilanterol (BREO ELLIPTA) 200-25 MCG/INH AEPB Inhale 1 puff into the lungs daily. 1 each 6   furosemide (LASIX) 20 MG tablet Take 1 tablet (20 mg total) by mouth daily. 90 tablet 3   gabapentin (NEURONTIN) 300 MG capsule Take 300 mg by mouth 3 (three) times daily.     insulin aspart protamine- aspart (NOVOLOG MIX 70/30) (70-30) 100 UNIT/ML injection  Inject 30-45 Units into the skin 2 (two) times daily.      levothyroxine (SYNTHROID) 50 MCG tablet Take 1 tablet (50 mcg total) by mouth daily before breakfast. ((for thyroid)) 90 tablet 3   losartan (COZAAR) 50 MG tablet Take 25 mg by mouth daily.     magnesium oxide (MAG-OX) 400 MG tablet Take 400 mg by mouth 2 (two) times daily.     metFORMIN (GLUCOPHAGE) 1000 MG tablet Take 1 tablet (1,000 mg total) by mouth 2 (two) times daily with a meal. Needs to be seen before next refill. 180 tablet 3   metoprolol (TOPROL-XL) 200 MG 24 hr tablet Take 1 tablet (200 mg total) by mouth daily. 90 tablet 3   mupirocin ointment (BACTROBAN) 2 % APPLY TO AFFECTED AREA EVERY DAY     pantoprazole (PROTONIX) 40 MG tablet Take 1 tablet (40 mg total) by mouth daily. 90 tablet 3   Potassium Chloride ER 20 MEQ TBCR Take 20 mEq by mouth daily. Take only on days when taking lasix 90 tablet 3   rosuvastatin (CRESTOR) 20 MG tablet Take 20 mg by mouth daily.     saccharomyces boulardii (FLORASTOR) 250 MG capsule Take 1 capsule (250 mg total) by mouth 2 (two) times daily. 60 capsule 1   Semaglutide, 1 MG/DOSE, (OZEMPIC, 1 MG/DOSE,) 2 MG/1.5ML SOPN Inject 1 mg into the skin once a week. 1 pen 3   Tiotropium Bromide-Olodaterol (STIOLTO RESPIMAT IN) Inhale 1 puff into the lungs daily as needed (shortness of breath).     triamcinolone cream (KENALOG) 0.1 % Apply 1 application topically 2 (two) times daily. (Patient not taking: Reported on 04/16/2019) 30 g 0   valACYclovir (VALTREX) 1000 MG tablet Take 1,000 mg by mouth daily as needed (cold sores).      No current facility-administered medications for this visit.    ALLERGIES:  No Known Allergies  PHYSICAL EXAM:  Performance status (ECOG): 1 - Symptomatic but completely ambulatory  Vitals:   02/17/20 1505  BP: (!) 154/70  Pulse: 98  Resp: 18  Temp: 97.8 F (36.6 C)  SpO2: 98%   Wt Readings from Last 3 Encounters:  02/17/20 201 lb 4.8 oz (91.3 kg)    01/22/20 199 lb 9.6 oz (90.5 kg)  11/03/19 198 lb 4 oz (89.9 kg)   Physical Exam Vitals reviewed.  Constitutional:      Appearance: Normal appearance.  Cardiovascular:     Rate and Rhythm: Normal rate and regular rhythm.     Pulses: Normal pulses.     Heart sounds: Normal heart sounds.  Pulmonary:     Effort: Pulmonary effort is normal.     Breath sounds: Normal breath sounds.  Abdominal:     Palpations: Abdomen is soft. There is no mass.     Tenderness: There is no abdominal tenderness.     Hernia: A hernia is present. Hernia is present in the ventral area (above umbilicus).  Neurological:     General: No focal deficit present.     Mental Status: He is alert and oriented to person, place, and time.  Psychiatric:        Mood and Affect: Mood normal.        Behavior: Behavior normal.     LABORATORY DATA:  I have reviewed the labs as listed.  CBC Latest Ref Rng & Units 02/06/2020 11/03/2019 08/12/2019  WBC 4.0 - 10.5 K/uL 6.3 6.9 4.7  Hemoglobin 13.0 - 17.0 g/dL 11.8(L) 11.9(L) 12.1(L)  Hematocrit 39 - 52 % 40.9 39.3 40.8  Platelets 150 - 400 K/uL 281 304 209   CMP Latest Ref Rng & Units 02/06/2020 11/03/2019 08/12/2019  Glucose 70 - 99 mg/dL 71 229(H) 382(H)  BUN 8 - 23 mg/dL 14 16 14   Creatinine 0.61 - 1.24 mg/dL 0.76 1.00 0.83  Sodium 135 - 145 mmol/L 143 144 141  Potassium 3.5 - 5.1 mmol/L 3.6 4.3 4.2  Chloride 98 - 111 mmol/L 107 105 104  CO2 22 - 32 mmol/L 27 23 29   Calcium 8.9 - 10.3 mg/dL 9.5 9.7 9.3  Total Protein 6.5 - 8.1 g/dL 6.9 6.5 6.7  Total Bilirubin 0.3 - 1.2 mg/dL 0.5 0.6 0.7  Alkaline Phos 38 - 126 U/L 62 76 62  AST 15 - 41 U/L 15 21 16   ALT 0 - 44 U/L 13 12 14       Component Value Date/Time   RBC 4.94 02/06/2020 0903   MCV 82.8 02/06/2020 0903   MCV 83 11/03/2019 1334   MCH 23.9 (L) 02/06/2020 0903   MCHC 28.9 (L) 02/06/2020 0903   RDW 18.6 (H) 02/06/2020 0903   RDW 16.6 (H) 11/03/2019 1334   LYMPHSABS 1.2 02/06/2020 0903   LYMPHSABS 0.9 11/03/2019  1334   MONOABS 0.9 02/06/2020 0903   EOSABS 0.3 02/06/2020 0903   EOSABS 0.1 11/03/2019 1334   BASOSABS 0.1 02/06/2020 0903   BASOSABS 0.1 11/03/2019 1334    DIAGNOSTIC IMAGING:  I have independently reviewed the scans and discussed with the patient. CT Chest Wo Contrast  Result Date: 01/22/2020 CLINICAL DATA:  Non-small cell stage IA right lower lobe lung cancer status post right lower lobectomy June 2016 and radiation therapy. Additional history of melanoma and prostate cancer. Restaging. EXAM: CT CHEST WITHOUT CONTRAST TECHNIQUE: Multidetector CT imaging of the chest was performed following the standard protocol without IV contrast. COMPARISON:  06/04/2019 chest CT. FINDINGS: Cardiovascular: Normal heart  size. No significant pericardial effusion/thickening. Three-vessel coronary atherosclerosis. Atherosclerotic nonaneurysmal thoracic aorta. Normal caliber pulmonary arteries. Mediastinum/Nodes: No discrete thyroid nodules. Unremarkable esophagus. No pathologically enlarged axillary, mediastinal or hilar lymph nodes, noting limited sensitivity for the detection of hilar adenopathy on this noncontrast study. Lungs/Pleura: No pneumothorax. Calcified bilateral pleural plaques, unchanged. No pleural effusions. Status post right lower lobectomy. Mild centrilobular and paraseptal emphysema. Stable calcified subcentimeter lingular granuloma. Subpleural 4 mm peripheral left lower lobe pulmonary nodule (series 5/image 109) is stable since at least 11/20/2018 CT and considered benign. No acute consolidative airspace disease or new significant pulmonary nodules. Upper abdomen: Stable 1.6 cm right adrenal nodule with density 9 HU, compatible with a benign adenoma. Simple 4.2 cm upper right renal cyst. Musculoskeletal: No aggressive appearing focal osseous lesions. Left shoulder arthroplasty. Moderate thoracic spondylosis. IMPRESSION: 1. No evidence of local tumor recurrence status post right lower lobectomy. 2. No  evidence of metastatic disease in the chest. 3. Three-vessel coronary atherosclerosis. 4. Stable right adrenal adenoma. 5. Aortic Atherosclerosis (ICD10-I70.0) and Emphysema (ICD10-J43.9). Electronically Signed   By: Ilona Sorrel M.D.   On: 01/22/2020 10:58     ASSESSMENT:  1.  Pancytopenia: -Hospital admission from 03/09/2019 through 03/11/2019 with fevers, found to have severe pancytopenia.  Extensive work-up for nutritional deficiencies, infectious causes were negative. -Last blood transfusion in November 2017 when hemoglobin dropped to 6.6. -We reviewed his CBC.  White count improved to 4.7 with normal ANC.  Platelet count also normalized.  Slightly anemic which is stable. -No further work-up needed.  We will recheck his CBC in 6 months.  2.  Right lung cancer: -Underwent right lower lobectomy on 12/17/2014 showing 2.8 cm squamous cell carcinoma, pT1b, PN 0, margins negative, no visceral pleural invasion. -Bronchial washings for hemoptysis on 11/02/2017 showed malignant cells consistent with non-small cell lung cancer. -He underwent radiation from 12/18/2017 through 01/31/2018 for recurrent lung cancer versus new primary tracheal carcinoma. -Most recent CT of the chest without contrast on 06/04/2019 showed no evidence of recurrence or metastatic disease.    PLAN:  1.  Pancytopenia: -CBC on 02/06/2020 shows normal white count and platelet count.  Hemoglobin is 11.8 with normal MCV of 82. -We will repeat labs in 6 months.  I will check ferritin and iron panel along with B12.  2.  Right lung cancer: -We reviewed CT scan of the chest without contrast from 01/21/2020 which showed no evidence of local tumor recurrence and metastatic disease.  Stable right adrenal adenoma. -Plan to repeat scan in 6 months.   Orders placed this encounter:  No orders of the defined types were placed in this encounter.    Derek Jack, MD [place] [phone]   I, Milinda Antis, am  acting as a scribe for Dr. Sanda Linger.  I, Derek Jack MD, have reviewed the above documentation for accuracy and completeness, and I agree with the above.

## 2020-02-17 NOTE — Patient Instructions (Signed)
[place] at [facility] Discharge Instructions  You were seen today by Dr. Delton Coombes. He went over your recent results. You will be scheduled for a CT scan of your chest before your next visit. Dr. Delton Coombes will see you back in 6 months for labs and follow up.   Thank you for choosing [place] at [facility] to provide your oncology and hematology care.  To afford each patient quality time with our provider, please arrive at least 15 minutes before your scheduled appointment time.   If you have a lab appointment with the [facility] please come in thru the Main Entrance and check in at the main information desk  You need to re-schedule your appointment should you arrive 10 or more minutes late.  We strive to give you quality time with our providers, and arriving late affects you and other patients whose appointments are after yours.  Also, if you no show three or more times for appointments you may be dismissed from the clinic at the providers discretion.     Again, thank you for choosing [facility].  Our hope is that these requests will decrease the amount of time that you wait before being seen by our physicians.       _____________________________________________________________  Should you have questions after your visit to [facility], please contact our office at [phone] between the hours of 8:00 a.m. and 4:30 p.m.  Voicemails left after 4:00 p.m. will not be returned until the following business day.  For prescription refill requests, have your pharmacy contact our office and allow 72 hours.    Cancer Center Support Programs:   > Cancer Support Group  2nd Tuesday of the month 1pm-2pm, Journey Room

## 2020-03-09 DIAGNOSIS — I480 Paroxysmal atrial fibrillation: Secondary | ICD-10-CM | POA: Insufficient documentation

## 2020-03-09 DIAGNOSIS — I2510 Atherosclerotic heart disease of native coronary artery without angina pectoris: Secondary | ICD-10-CM | POA: Insufficient documentation

## 2020-03-09 DIAGNOSIS — Z7189 Other specified counseling: Secondary | ICD-10-CM | POA: Insufficient documentation

## 2020-03-09 NOTE — Progress Notes (Signed)
Cardiology Office Note   Date:  03/10/2020   ID:  Timothy Guerrero, DOB 10-Feb-1941, MRN [id number]  PCP:  Dettinger, Fransisca Kaufmann, MD  Cardiologist:   Minus Breeding, MD   Chief Complaint  Patient presents with  . Neuroapthy     History of Present Illness: Timothy Guerrero is a 79 y.o. male who presents for follow-up of atrial flutter.  [facility] did not think ablation was indicated. In the past he's had nonobstructive disease and small vessel disease with high-grade stenosis and a nondominant right coronary artery on cath in 2013 in the [place]. He had a stress test in 2015 which was negative. He's been managed medically. He's had a well preserved ejection fraction.  He has had continued problems with HTN and has had adjustments to his meds. He has been anemic. He was on anticoagulation at that time and this was stopped. He actually required transfusion.   An echo in December 2017 demonstrated an EF of 25 - 30%.   Perfusion study demonstrated an EF of 35% with a fixed medium sized mid inferior perfusion defect.  He has remained off of anticoagulation because of his bleed.   He refused atrial flutter ablation.  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  He started radiation therapy.  He said that since I saw him he has had remission of this and his previous lung cancer and surveillance is demonstrated no recurrence.  Late last year I repeated an echo and his EF was 60%.  Since I last saw him he has had some slightly progressive shortness of breath with activities.  He is not describing new PND or orthopnea.  Is not reporting palpitations, presyncope or syncope.  He has no chest pressure, neck or arm discomfort.  Despite his shortness of breath he does do traveling and he denies any severe limitations with this.   Past Medical History:  Diagnosis Date  . Anemia   . Arthritis   . Atrial flutter ([place])    post op following lobectomy  . Benign essential  tremor   . Blood transfusion without reported diagnosis   . Cataract   . CHF (congestive heart failure) ([place])   . Colon polyp    2007, polyp with early cancer, one year f/u no residual polyp. next TCS 2010, see P[facility]. [facility].  [name] COPD (chronic obstructive pulmonary disease) ([place])   . Diabetes mellitus without complication ([place])    10 years  . GERD (gastroesophageal reflux disease)   . HOH (hard of hearing)   . Hypercholesteremia   . Hypertension    10 years  . Lung cancer ([place])   . Prostate cancer ([place])   . Shortness of breath dyspnea    due to lung mass  . Skin melanoma ([place])   . Sleep apnea    wears CPAP sometimes, cannot tolerate all the time.    Past Surgical History:  Procedure Laterality Date  . bottom lobe of right lung removed  12/17/14  . CARDIAC CATHETERIZATION     no PCI  . CATARACT EXTRACTION Left   . CATARACT EXTRACTION W/PHACO Left 01/03/2016   Procedure: CATARACT EXTRACTION PHACO AND INTRAOCULAR LENS PLACEMENT LEFT EYE CDE=7.78;  Surgeon: Williams Che, MD;  Location: [facility];  Service: Ophthalmology;  Laterality: Left;  . CATARACT EXTRACTION W/PHACO Right 03/20/2016   Procedure: CATARACT EXTRACTION PHACO AND INTRAOCULAR LENS PLACEMENT; CDE:  9.58;  Surgeon: Williams Che, MD;  Location: [facility];  Service: Ophthalmology;  Laterality: Right;  . COLONOSCOPY  08/2008   Dr. Leafy Half, [place]: normal, internal grade 1 hemorrhoids. next TCS 08/2013  . COLONOSCOPY N/A 03/04/2014   Procedure: COLONOSCOPY;  Surgeon: Daneil Dolin, MD;  Location: [facility];  Service: Endoscopy;  Laterality: N/A;  7:30am  . EYE SURGERY    . knee replacements     bilateral. over 10 years ago  . LOBECTOMY Right 12/17/2014   Procedure: RIGHT LOWER LOBECTOMY;  Surgeon: Melrose Nakayama, MD;  Location: [place];  Service: Thoracic;  Laterality: Right;  . LYMPH NODE DISSECTION Right 12/17/2014   Procedure: LYMPH NODE DISSECTION;  Surgeon: Melrose Nakayama, MD;  Location: [place];  Service: Thoracic;  Laterality: Right;  . NECK SURGERY     melanoma removed.  [name] PROSTATE SURGERY     prostatectomy, radical  . TOTAL HIP ARTHROPLASTY  2007   left  . total shoulder replacement  2011   left  . VIDEO ASSISTED THORACOSCOPY (VATS)/WEDGE RESECTION Right 12/17/2014   Procedure: RIGHT VIDEO ASSISTED THORACOSCOPY (VATS);  Surgeon: Melrose Nakayama, MD;  Location: [place];  Service: Thoracic;  Laterality: Right;  [name] VIDEO BRONCHOSCOPY Bilateral 11/17/2016   Procedure: VIDEO BRONCHOSCOPY WITHOUT FLUORO;  Surgeon: Javier Glazier, MD;  Location: [facility];  Service: Cardiopulmonary;  Laterality: Bilateral;  . VIDEO BRONCHOSCOPY N/A 11/01/2017   Procedure: VIDEO BRONCHOSCOPY;  Surgeon: Melrose Nakayama, MD;  Location: [facility];  Service: Thoracic;  Laterality: N/A;     Current Outpatient Medications  Medication Sig Dispense Refill  . acetaminophen (TYLENOL) 325 MG tablet Take 2 tablets (650 mg total) by mouth every 6 (six) hours as needed for mild pain, fever or headache (or Fever >/= 101). 12 tablet 1  . albuterol (PROVENTIL HFA;VENTOLIN HFA) 108 (90 Base) MCG/ACT inhaler Inhale 1 puff into the lungs every 6 (six) hours as needed for wheezing or shortness of breath.    [name] amLODipine (NORVASC) 10 MG tablet TAKE 1 TABLET BY MOUTH EVERY DAY 90 tablet 3  . ASCORBIC ACID PO Take by mouth.    . cholecalciferol (VITAMIN D3) 25 MCG (1000 UT) tablet Take 1,000 Units by mouth daily.    . Cyanocobalamin (VITAMIN B-12 PO) Take by mouth.    . fluticasone furoate-vilanterol (BREO ELLIPTA) 200-25 MCG/INH AEPB Inhale 1 puff into the lungs daily. 1 each 6  . furosemide (LASIX) 20 MG tablet Take 1 tablet (20 mg total) by mouth daily. 90 tablet 3  . gabapentin (NEURONTIN) 300 MG capsule Take 300 mg by mouth 3 (three) times daily.    . insulin aspart protamine- aspart (NOVOLOG MIX 70/30) (70-30) 100 UNIT/ML injection Inject 30-45 Units into the skin 2 (two)  times daily.     [name] levothyroxine (SYNTHROID) 50 MCG tablet Take 1 tablet (50 mcg total) by mouth daily before breakfast. ((for thyroid)) 90 tablet 3  . losartan (COZAAR) 50 MG tablet Take 25 mg by mouth daily.     . magnesium oxide (MAG-OX) 400 MG tablet Take 400 mg by mouth 2 (two) times daily.    . metFORMIN (GLUCOPHAGE) 1000 MG tablet Take 1 tablet (1,000 mg total) by mouth 2 (two) times daily with a meal. Needs to be seen before next refill. 180 tablet 3  . metoprolol (TOPROL-XL) 200 MG 24 hr tablet Take 1 tablet (200 mg total) by mouth daily. 90 tablet 3  .  mupirocin ointment (BACTROBAN) 2 % APPLY TO AFFECTED AREA EVERY DAY    . pantoprazole (PROTONIX) 40 MG tablet Take 1 tablet (40 mg total) by mouth daily. 90 tablet 3  . Potassium Chloride ER 20 MEQ TBCR Take 20 mEq by mouth daily. Take only on days when taking lasix 90 tablet 3  . Semaglutide, 1 MG/DOSE, (OZEMPIC, 1 MG/DOSE,) 2 MG/1.5ML SOPN Inject 1 mg into the skin once a week. 1 pen 3  . Tiotropium Bromide-Olodaterol (STIOLTO RESPIMAT IN) Inhale 1 puff into the lungs daily as needed (shortness of breath).    . triamcinolone cream (KENALOG) 0.1 % Apply 1 application topically 2 (two) times daily. 30 g 0  . valACYclovir (VALTREX) 1000 MG tablet Take 1,000 mg by mouth daily as needed (cold sores).     . rosuvastatin (CRESTOR) 40 MG tablet Take 1 tablet (40 mg total) by mouth daily. 90 tablet 3   No current facility-administered medications for this visit.    Allergies:   Patient has no known allergies.    ROS:  Please see the history of present illness.   Otherwise, review of systems are positive for neuropathy.   All other systems are reviewed and negative.    PHYSICAL EXAM: VS:  BP 120/70   Pulse 99   Wt 198 lb (89.8 kg)   BMI 26.85 kg/m  , BMI Body mass index is 26.85 kg/m.  Marland KitchenGENERAL:  Well appearing NECK:  No jugular venous distention, waveform within normal limits, carotid upstroke brisk and symmetric, no bruits, no  thyromegaly LUNGS:  Clear to auscultation bilaterally CHEST:  Unremarkable HEART:  PMI not displaced or sustained,S1 and S2 within normal limits, no S3, no S4, no clicks, no rubs, no murmurs ABD:  Flat, positive bowel sounds normal in frequency in pitch, no bruits, no rebound, no guarding, no midline pulsatile mass, no hepatomegaly, no splenomegaly EXT:  2 plus pulses throughout, no edema, no cyanosis no clubbing   EKG:  EKG is  done today. Sinus rhythm, rate 99, axis within normal limits, intervals within normal limits, no acute ST-T wave changes.  PVCs  Recent Labs: 04/11/2019: Magnesium 2.0 11/03/2019: TSH 1.700 02/06/2020: ALT 13; BUN 14; Creatinine, Ser 0.76; Hemoglobin 11.8; Platelets 281; Potassium 3.6; Sodium 143    Lipid Panel    Component Value Date/Time   CHOL 176 11/03/2019 1334   TRIG 132 11/03/2019 1334   HDL 46 11/03/2019 1334   CHOLHDL 3.8 11/03/2019 1334   LDLCALC 106 (H) 11/03/2019 1334      Wt Readings from Last 3 Encounters:  03/10/20 198 lb (89.8 kg)  02/17/20 201 lb 4.8 oz (91.3 kg)  01/22/20 199 lb 9.6 oz (90.5 kg)     Other studies Reviewed: Additional studies/ records that were reviewed today include:  Labs Review of the above records demonstrates:   See elsewhere   ASSESSMENT AND PLAN:   ATRIAL FLUTTER: [name] has a CHA2DS2 - VASc score of 4 .   He has not had symptomatic paroxysms.  He tolerates anticoagulation.  No change in therapy.  HTN:   His blood pressure is at target.  No change in therapy.   CAD: He has no anginal symptoms.  He will continue with risk reduction.   CHRONIC SYSTOLIC HF:    EF was normal in December.   He does have some increased shortness of breath so I will check a BNP level and if this is at all elevated increase his diuretic.  DM:   His A1c was 8.2 which is down from 9.2.   This is managed by Dettinger, Fransisca Kaufmann, MD  DYSLIPIDEMIA: LDL was 106.  I am going to increase his Crestor to 40 mg daily.    NEUROPATHY: I suggested trying benfotiamine   Current medicines are reviewed at length with the patient today.  The patient does have concerns regarding medicines.  The following changes have been made:  As above  Labs/ tests ordered today include:     Orders Placed This Encounter  Procedures  . Pro b natriuretic peptide  . Lipid panel  . Hepatic function panel  . EKG 12-Lead     Disposition:   FU with me in 12 months.     Signed, Minus Breeding, MD  03/10/2020 4:24 PM    [facility]

## 2020-03-10 ENCOUNTER — Other Ambulatory Visit: Payer: Self-pay

## 2020-03-10 ENCOUNTER — Ambulatory Visit (INDEPENDENT_AMBULATORY_CARE_PROVIDER_SITE_OTHER): Payer: Medicare HMO | Admitting: Cardiology

## 2020-03-10 ENCOUNTER — Other Ambulatory Visit: Payer: Medicare HMO

## 2020-03-10 ENCOUNTER — Encounter: Payer: Self-pay | Admitting: Cardiology

## 2020-03-10 VITALS — BP 120/70 | HR 99 | Wt 198.0 lb

## 2020-03-10 DIAGNOSIS — Z79899 Other long term (current) drug therapy: Secondary | ICD-10-CM

## 2020-03-10 DIAGNOSIS — I2510 Atherosclerotic heart disease of native coronary artery without angina pectoris: Secondary | ICD-10-CM | POA: Diagnosis not present

## 2020-03-10 DIAGNOSIS — R0602 Shortness of breath: Secondary | ICD-10-CM

## 2020-03-10 DIAGNOSIS — E118 Type 2 diabetes mellitus with unspecified complications: Secondary | ICD-10-CM

## 2020-03-10 DIAGNOSIS — E785 Hyperlipidemia, unspecified: Secondary | ICD-10-CM | POA: Diagnosis not present

## 2020-03-10 DIAGNOSIS — I10 Essential (primary) hypertension: Secondary | ICD-10-CM | POA: Diagnosis not present

## 2020-03-10 DIAGNOSIS — I480 Paroxysmal atrial fibrillation: Secondary | ICD-10-CM

## 2020-03-10 DIAGNOSIS — I5022 Chronic systolic (congestive) heart failure: Secondary | ICD-10-CM

## 2020-03-10 MED ORDER — ROSUVASTATIN CALCIUM 40 MG PO TABS: 40.0000 mg | ORAL_TABLET | Freq: Every day | ORAL | 3 refills | Status: AC

## 2020-03-10 NOTE — Patient Instructions (Signed)
Medication Instructions:  Please increase your Crestor to 40 mg a day. Continue all other medications as listed.  *If you need a refill on your cardiac medications before your next appointment, please call your pharmacy*  Lab Work: Please have blood work today (Pro-BNP) Have fasting lipid and liver in 10 weeks (around 05/19/2020)  If you have labs (blood work) drawn today and your tests are completely normal, you will receive your results only by: [name] MyChart Message (if you have MyChart) OR . A paper copy in the mail If you have any lab test that is abnormal or we need to change your treatment, we will call you to review the results.  Follow-Up: At [facility], you and your health needs are our priority.  As part of our continuing mission to provide you with exceptional heart care, we have created designated Provider Care Teams.  These Care Teams include your primary Cardiologist (physician) and Advanced Practice Providers (APPs -  Physician Assistants and Nurse Practitioners) who all work together to provide you with the care you need, when you need it.  We recommend signing up for the patient portal called "MyChart".  Sign up information is provided on this After Visit Summary.  MyChart is used to connect with patients for Virtual Visits (Telemedicine).  Patients are able to view lab/test results, encounter notes, upcoming appointments, etc.  Non-urgent messages can be sent to your provider as well.   To learn more about what you can do with MyChart, go to [email or web address].    Your next appointment:   12 month(s)  The format for your next appointment:   In Person  Provider:   Minus Breeding, MD   Thank you for choosing [facility]!!     Benfotiamine (B9)

## 2020-03-11 LAB — LIPID PANEL
Chol/HDL Ratio: 3.4 ratio (ref 0.0–5.0)
Cholesterol, Total: 164 mg/dL (ref 100–199)
HDL: 48 mg/dL (ref >39)
LDL Chol Calc (NIH): 82 mg/dL (ref 0–99)
Triglycerides: 205 mg/dL — ABNORMAL HIGH (ref 0–149)
VLDL Cholesterol Cal: 34 mg/dL (ref 5–40)

## 2020-03-11 LAB — HEPATIC FUNCTION PANEL
ALT: 10 IU/L (ref 0–44)
AST: 14 IU/L (ref 0–40)
Albumin: 4.6 g/dL (ref 3.7–4.7)
Alkaline Phosphatase: 69 IU/L (ref 48–121)
Bilirubin Total: 0.2 mg/dL (ref 0.0–1.2)
Bilirubin, Direct: 0.1 mg/dL (ref 0.00–0.40)
Total Protein: 6.8 g/dL (ref 6.0–8.5)

## 2020-03-11 LAB — PRO B NATRIURETIC PEPTIDE: NT-Pro BNP: 149 pg/mL (ref 0–486)

## 2020-03-16 DIAGNOSIS — I509 Heart failure, unspecified: Secondary | ICD-10-CM | POA: Diagnosis not present

## 2020-03-16 DIAGNOSIS — E1165 Type 2 diabetes mellitus with hyperglycemia: Secondary | ICD-10-CM | POA: Diagnosis not present

## 2020-03-16 DIAGNOSIS — J439 Emphysema, unspecified: Secondary | ICD-10-CM | POA: Diagnosis not present

## 2020-03-16 DIAGNOSIS — B009 Herpesviral infection, unspecified: Secondary | ICD-10-CM | POA: Diagnosis not present

## 2020-03-16 DIAGNOSIS — E785 Hyperlipidemia, unspecified: Secondary | ICD-10-CM | POA: Diagnosis not present

## 2020-03-16 DIAGNOSIS — E1142 Type 2 diabetes mellitus with diabetic polyneuropathy: Secondary | ICD-10-CM | POA: Diagnosis not present

## 2020-03-16 DIAGNOSIS — E876 Hypokalemia: Secondary | ICD-10-CM | POA: Diagnosis not present

## 2020-03-16 DIAGNOSIS — E039 Hypothyroidism, unspecified: Secondary | ICD-10-CM | POA: Diagnosis not present

## 2020-03-16 DIAGNOSIS — Z794 Long term (current) use of insulin: Secondary | ICD-10-CM | POA: Diagnosis not present

## 2020-03-16 DIAGNOSIS — I110 Hypertensive heart disease with heart failure: Secondary | ICD-10-CM | POA: Diagnosis not present

## 2020-03-17 ENCOUNTER — Other Ambulatory Visit: Payer: Self-pay | Admitting: *Deleted

## 2020-03-17 DIAGNOSIS — E785 Hyperlipidemia, unspecified: Secondary | ICD-10-CM

## 2020-03-17 MED ORDER — EZETIMIBE 10 MG PO TABS: 10.0000 mg | ORAL_TABLET | Freq: Every day | ORAL | 3 refills | Status: AC

## 2020-04-01 ENCOUNTER — Ambulatory Visit (INDEPENDENT_AMBULATORY_CARE_PROVIDER_SITE_OTHER): Payer: Medicare HMO | Admitting: *Deleted

## 2020-04-01 ENCOUNTER — Other Ambulatory Visit: Payer: Self-pay

## 2020-04-01 DIAGNOSIS — Z0000 Encounter for general adult medical examination without abnormal findings: Secondary | ICD-10-CM | POA: Diagnosis not present

## 2020-04-01 DIAGNOSIS — Z23 Encounter for immunization: Secondary | ICD-10-CM

## 2020-04-01 NOTE — Progress Notes (Addendum)
MEDICARE ANNUAL WELLNESS VISIT  04/01/2020  Telephone Visit Disclaimer This Medicare AWV was conducted by telephone due to national recommendations for restrictions regarding the COVID-19 Pandemic (e.g. social distancing).  I verified, using two identifiers, that I am speaking with Timothy Guerrero or their authorized healthcare agent. I discussed the limitations, risks, security, and privacy concerns of performing an evaluation and management service by telephone and the potential availability of an in-person appointment in the future. The patient expressed understanding and agreed to proceed.  Location of Patient: Home Location of Provider (nurse):  [facility]  Subjective:    Timothy Guerrero is a 79 y.o. male patient of Dettinger, Fransisca Kaufmann, MD who had a Medicare Annual Wellness Visit today via telephone. [place] is Retired and lives alone. he has 2 adult children living and 1 son deceased. he reports that he is socially active and does interact with friends/family regularly. he is minimally physically active and enjoys traveling and dancing.  Patient Care Team: Dettinger, Fransisca Kaufmann, MD as PCP - General (Family Medicine) Gala Romney, Cristopher Estimable, MD as Consulting Physician (Gastroenterology) Martinique, Amy, MD as Consulting Physician (Dermatology) Minus Breeding, MD as Consulting Physician (Cardiology) Javier Glazier, MD as Consulting Physician (Pulmonary Disease) Neva Seat, MD as Consulting Physician (Hematology)  Advanced Directives 04/01/2020 02/17/2020 01/22/2020 08/19/2019 06/05/2019 04/16/2019 04/01/2019  Does Patient Have a Medical Advance Directive? Yes Yes Yes No Yes Yes Yes  Type of Paramedic of [place];Living will [place];Living will [place];Living will - [place];Living will [place];Living will [place];Living will  Does patient want to make changes to medical advance directive? - No - Patient declined No - Patient declined - - No - Patient declined No - Patient declined  Copy of [place] in Chart? No - copy requested No - copy requested - - No - copy requested No - copy requested No - copy requested  Would patient like information on creating a medical advance directive? - - - No - Patient declined - - [facility] Utilization Over the Past 12 Months: # of hospitalizations or ER visits: 0 # of surgeries: 0  Review of Systems    Patient reports that his overall health is unchanged compared to last year.  History obtained from chart review and the patient  Patient Reported Readings (BP, Pulse, CBG, Weight, etc) CBG:214  Pain Assessment Pain : No/denies pain     Current Medications & Allergies (verified) Allergies as of 04/01/2020   No Known Allergies     Medication List       Accurate as of April 01, 2020  8:55 AM. If you have any questions, ask your nurse or doctor.        acetaminophen 325 MG tablet Commonly known as: TYLENOL Take 2 tablets (650 mg total) by mouth every 6 (six) hours as needed for mild pain, fever or headache (or Fever >/= 101).   albuterol 108 (90 Base) MCG/ACT inhaler Commonly known as: VENTOLIN HFA Inhale 1 puff into the lungs every 6 (six) hours as needed for wheezing or shortness of breath.   amLODipine 10 MG tablet Commonly known as: NORVASC TAKE 1 TABLET BY MOUTH EVERY DAY   ASCORBIC ACID PO Take by mouth.   Breo Ellipta 200-25 MCG/INH Aepb Generic drug: fluticasone furoate-vilanterol Inhale 1 puff into the lungs daily.  cholecalciferol 25 MCG (1000 UNIT) tablet Commonly known as: VITAMIN D3 Take 1,000 Units by mouth daily.   ezetimibe 10 MG tablet Commonly known as: ZETIA Take 1 tablet (10 mg total) by mouth daily.   furosemide 20 MG tablet Commonly known as: LASIX Take 1 tablet (20 mg total)  by mouth daily.   gabapentin 300 MG capsule Commonly known as: NEURONTIN Take 300 mg by mouth 3 (three) times daily.   insulin aspart protamine- aspart (70-30) 100 UNIT/ML injection Commonly known as: NOVOLOG MIX 70/30 Inject 50 Units into the skin 2 (two) times daily.   levothyroxine 50 MCG tablet Commonly known as: SYNTHROID Take 1 tablet (50 mcg total) by mouth daily before breakfast. ((for thyroid))   losartan 50 MG tablet Commonly known as: COZAAR Take 25 mg by mouth daily.   magnesium oxide 400 MG tablet Commonly known as: MAG-OX Take 400 mg by mouth 2 (two) times daily.   metFORMIN 1000 MG tablet Commonly known as: GLUCOPHAGE Take 1 tablet (1,000 mg total) by mouth 2 (two) times daily with a meal. Needs to be seen before next refill.   metoprolol 200 MG 24 hr tablet Commonly known as: TOPROL-XL Take 1 tablet (200 mg total) by mouth daily.   mupirocin ointment 2 % Commonly known as: BACTROBAN APPLY TO AFFECTED AREA EVERY DAY   Ozempic (1 MG/DOSE) 2 MG/1.5ML Sopn Generic drug: Semaglutide (1 MG/DOSE) Inject 1 mg into the skin once a week.   pantoprazole 40 MG tablet Commonly known as: PROTONIX Take 1 tablet (40 mg total) by mouth daily.   Potassium Chloride ER 20 MEQ Tbcr Take 20 mEq by mouth daily. Take only on days when taking lasix   rosuvastatin 40 MG tablet Commonly known as: CRESTOR Take 1 tablet (40 mg total) by mouth daily.   STIOLTO RESPIMAT IN Inhale 1 puff into the lungs daily as needed (shortness of breath).   triamcinolone cream 0.1 % Commonly known as: KENALOG Apply 1 application topically 2 (two) times daily.   Valtrex 1000 MG tablet Generic drug: valACYclovir Take 1,000 mg by mouth daily as needed (cold sores).   VITAMIN B-12 PO Take by mouth.       History (reviewed): Past Medical History:  Diagnosis Date  . Anemia   . Arthritis   . Atrial flutter ([place])    post op following lobectomy  . Benign essential tremor   . Blood  transfusion without reported diagnosis   . Cataract   . CHF (congestive heart failure) ([place])   . Colon polyp    2007, polyp with early cancer, one year f/u no residual polyp. next TCS 2010, see PSH. [facility].  [name] COPD (chronic obstructive pulmonary disease) ([place])   . Diabetes mellitus without complication ([place])    10 years  . GERD (gastroesophageal reflux disease)   . HOH (hard of hearing)   . Hypercholesteremia   . Hypertension    10 years  . Lung cancer ([place])   . Prostate cancer ([place])   . Shortness of breath dyspnea    due to lung mass  . Skin melanoma ([place])   . Sleep apnea    wears CPAP sometimes, cannot tolerate all the time.   Past Surgical History:  Procedure Laterality Date  . bottom lobe of right lung removed  12/17/14  . CARDIAC CATHETERIZATION     no PCI  . CATARACT EXTRACTION Left   . CATARACT EXTRACTION W/PHACO Left 01/03/2016   Procedure: CATARACT  EXTRACTION PHACO AND INTRAOCULAR LENS PLACEMENT LEFT EYE CDE=7.78;  Surgeon: Williams Che, MD;  Location: [facility];  Service: Ophthalmology;  Laterality: Left;  . CATARACT EXTRACTION W/PHACO Right 03/20/2016   Procedure: CATARACT EXTRACTION PHACO AND INTRAOCULAR LENS PLACEMENT; CDE:  9.58;  Surgeon: Williams Che, MD;  Location: [facility];  Service: Ophthalmology;  Laterality: Right;  . COLONOSCOPY  08/2008   Dr. Leafy Half, [place]: normal, internal grade 1 hemorrhoids. next TCS 08/2013  . COLONOSCOPY N/A 03/04/2014   Procedure: COLONOSCOPY;  Surgeon: Daneil Dolin, MD;  [facility]: [facility];  Service: Endoscopy;  Laterality: N/A;  7:30am  . EYE SURGERY    . knee replacements     bilateral. over 10 years ago  . LOBECTOMY Right 12/17/2014   Procedure: RIGHT LOWER LOBECTOMY;  Surgeon: Melrose Nakayama, MD;  Location: [place];  Service: Thoracic;  Laterality: Right;  . LYMPH NODE DISSECTION Right 12/17/2014   Procedure: LYMPH NODE DISSECTION;  Surgeon: Melrose Nakayama, MD;   Location: [place];  Service: Thoracic;  Laterality: Right;  . NECK SURGERY     melanoma removed.  [name] PROSTATE SURGERY     prostatectomy, radical  . TOTAL HIP ARTHROPLASTY  2007   left  . total shoulder replacement  2011   left  . VIDEO ASSISTED THORACOSCOPY (VATS)/WEDGE RESECTION Right 12/17/2014   Procedure: RIGHT VIDEO ASSISTED THORACOSCOPY (VATS);  Surgeon: Melrose Nakayama, MD;  Location: [place];  Service: Thoracic;  Laterality: Right;  [name] VIDEO BRONCHOSCOPY Bilateral 11/17/2016   Procedure: VIDEO BRONCHOSCOPY WITHOUT FLUORO;  Surgeon: Javier Glazier, MD;  Location: [facility];  Service: Cardiopulmonary;  Laterality: Bilateral;  . VIDEO BRONCHOSCOPY N/A 11/01/2017   Procedure: VIDEO BRONCHOSCOPY;  Surgeon: Melrose Nakayama, MD;  Location[facility];  Service: Thoracic;  Laterality: N/A;   Family History  Problem Relation Age of Onset  . Cancer Mother        unknown type  . COPD Sister   . Breast cancer Sister   . Cancer Sister 61       breast  . Heart attack Daughter   . Prostate cancer Son   . Colon cancer Neg Hx    Social History   Socioeconomic History  . Marital status: Divorced    Spouse name: Not on file  . Number of children: 3  . Years of education: associates  . Highest education level: Associate degree: occupational, Hotel manager, or vocational program  Occupational History  . Occupation: retired    Comment: truck Geophysicist/field seismologist  Tobacco Use  . Smoking status: Former Smoker    Packs/day: 2.00    Years: 32.00    Pack years: 64.00    Types: Cigarettes    Quit date: 07/03/1986    Years since quitting: 33.7  . Smokeless tobacco: Never Used  Vaping Use  . Vaping Use: Never used  Substance and Sexual Activity  . Alcohol use: Yes    Alcohol/week: 0.0 standard drinks    Comment: rarely wine  . Drug use: No  . Sexual activity: Yes  Other Topics Concern  . Not on file  Social History Narrative   [employer].  Lives alone.      [facility]:   Originally from [facility] [place] [facility].  Served in [employer] with significant exposure to asbestos. 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 He has also lived in [place], [place], & [place]. He has lived in [place] since 1979. 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 No pets currently. Remote bird exposure.       Patient is divorced but has had a girlfriend for 7 months. He enjoys going dancing several times a week. He had 3 children but one passed away at 27 from prostate cancer. He has 17 grandchildren.    Social Determinants of Health   Financial Resource Strain:   . Difficulty of Paying Living Expenses: Not on file  Food Insecurity:   . Worried About Charity fundraiser in the Last Year: Not on file  . Ran Out of Food in the Last Year: Not on file  Transportation Needs:   . Lack of Transportation (Medical): Not on file  . Lack of Transportation (Non-Medical): Not on file  Physical Activity:   . Days of Exercise per Week: Not on file  . Minutes of Exercise per Session: Not on file  Stress:   . Feeling of Stress : Not on file  Social Connections:   . Frequency of Communication with Friends and Family: Not on file  . Frequency of Social Gatherings with Friends and Family: Not on file  . Attends Religious Services: Not on file  . Active Member of Clubs or Organizations: Not on file  . Attends Archivist Meetings: Not on file  . Marital Status: Not on file    Activities of Daily Living In your present state of health, do you have any difficulty performing the following activities: 04/01/2020  Hearing? Y  Comment wears hearing aids  Vision? N  Comment Wears glasses  Difficulty concentrating or making decisions? Y  Comment Remembering at times  Walking or climbing stairs? N  Dressing or bathing? N  Doing errands, shopping? N  Preparing Food and eating ? N  Using the Toilet? N  In the past six months, have you accidently leaked urine? N  Do you have problems with loss of bowel control? N  Managing your Medications? N    Managing your Finances? N  Housekeeping or managing your Housekeeping? N  Some recent data might be hidden    Patient Education/ Literacy How often do you need to have someone help you when you read instructions, pamphlets, or other written materials from your doctor or pharmacy?: 1 - Never What is the last grade level you completed in school?: 2 years of College  Exercise Current Exercise Habits: Home exercise routine, Type of exercise: Other - see comments (Exercise machines at [employer]), Time (Minutes): 30, Frequency (Times/Week): 3, Weekly Exercise (Minutes/Week): 90, Intensity: Mild, Exercise limited by: respiratory conditions(s);cardiac condition(s)  Diet Patient reports consuming 2 meals a day and 2 snack(s) a day Patient reports that his primary diet is: Regular Patient reports that she does have regular access to food.   Depression Screen PHQ 2/9 Scores 04/01/2020 11/03/2019 04/01/2019 03/25/2019 08/26/2018 05/27/2018 05/14/2018  PHQ - 2 Score 0 0 4 1 1  0 0  PHQ- 9 Score - - 8 - - - -     Fall Risk Fall Risk  04/01/2020 11/03/2019 04/01/2019 03/25/2019 05/27/2018  Falls in the past year? 0 0 0 0 0  Number falls in past yr: - - 0 - -  Injury with Fall? - - 0 - -  Risk for fall due to : - - - - -  Follow up - - Falls prevention discussed - -  Comment - - Get rid of all throw rugs in the house, adequate lighting in the walkways and grab  bars in the bathroom - -     Objective:  Timothy Guerrero seemed alert and oriented and he participated appropriately during our telephone visit.  Blood Pressure Weight BMI  BP Readings from Last 3 Encounters:  03/10/20 120/70  02/17/20 (!) 154/70  01/22/20 (!) 160/80   Wt Readings from Last 3 Encounters:  03/10/20 198 lb (89.8 kg)  02/17/20 201 lb 4.8 oz (91.3 kg)  01/22/20 199 lb 9.6 oz (90.5 kg)   BMI Readings from Last 1 Encounters:  03/10/20 26.85 kg/m    *Unable to obtain current vital signs, weight, and BMI due to telephone  visit type  Hearing/Vision  . Eduard did not seem to have difficulty with hearing/understanding during the telephone conversation . Reports that he has had a formal eye exam by an eye care professional within the past year . Reports that he has had a formal hearing evaluation within the past year *Unable to fully assess hearing and vision during telephone visit type  Cognitive Function: 6CIT Screen 04/01/2020 04/01/2019  What Year? 0 points 0 points  What month? 0 points 0 points  What time? 0 points 0 points  Count back from 20 0 points 0 points  Months in reverse 0 points 0 points  Repeat phrase 0 points 2 points  Total Score 0 2   (Normal:0-7, Significant for Dysfunction: >8)  Normal Cognitive Function Screening: Yes   Immunization & Health Maintenance Record Immunization History  Administered Date(s) Administered  . Fluad Quad(high Dose 65+) 06/05/2019  . Influenza, High Dose Seasonal PF 04/19/2016, 04/19/2018  . Influenza,inj,Quad PF,6+ Mos 03/31/2015  . Influenza,inj,quad, With Preservative 01/31/2017  . Pneumococcal Conjugate-13 07/30/2015  . Pneumococcal Polysaccharide-23 08/16/2017  . Tdap 04/24/2016    Health Maintenance  Topic Date Due  . INFLUENZA VACCINE  04/01/2020 (Originally 02/01/2020)  . HEMOGLOBIN A1C  05/05/2020  . OPHTHALMOLOGY EXAM  07/06/2020  . FOOT EXAM  11/02/2020  . TETANUS/TDAP  04/24/2026  . Hepatitis C Screening  Completed  . PNA vac Low Risk Adult  Completed  . COVID-19 Vaccine  Discontinued       Assessment  This is a routine wellness examination for [employer].  Health Maintenance: Due or Overdue There are no preventive care reminders to display for this patient.  Timothy Guerrero does not need a referral for Community Assistance: Care Management:   no Social Work:    no Prescription Assistance:  no Nutrition/Diabetes Education:  no   Plan:   Goals Addressed            This Visit's Progress   . AWV        04/01/2020 AWV Goal: Fall Prevention  . Over the next year, patient will decrease their risk for falls by: o Using assistive devices, such as a cane or walker, as needed o Identifying fall risks within their home and correcting them by: - Removing throw rugs - Adding handrails to stairs or ramps - Removing clutter and keeping a clear pathway throughout the home - Increasing light, especially at night - Adding shower handles/bars - Raising toilet seat o Identifying potential personal risk factors for falls: - Medication side effects - Incontinence/urgency - Vestibular dysfunction - Hearing loss - Musculoskeletal disorders - Neurological disorders - Orthostatic hypotension  04/01/2020 AWV Goal: Diabetes Management  . Patient will maintain an A1C level below 8.0 . Patient will not develop any diabetic foot complications . Patient will not experience any hypoglycemic episodes over  the next 3 months . Patient will notify our office of any CBG readings outside of the provider recommended range by calling [phone] . Patient will adhere to provider recommendations for diabetes management  Patient Self Management Activities . take all medications as prescribed and report any negative side effects . monitor and record blood sugar readings as directed . adhere to a low carbohydrate diet that incorporates lean proteins, vegetables, whole grains, low glycemic fruits . check feet daily noting any sores, cracks, injuries, or callous formations . see PCP or podiatrist if he notices any changes in his legs, feet, or toenails . Patient will visit PCP and have an A1C level checked every 3 to 6 months as directed  . have a yearly eye exam to monitor for vascular changes associated with diabetes and will request that the report be sent to his pcp.  . consult with his PCP regarding any changes in his health or new or worsening symptoms       Personalized Health Maintenance & Screening  Recommendations  Influenza vaccine  Lung Cancer Screening Recommended: no (Low Dose CT Chest recommended if Age 17-80 years, 30 pack-year currently smoking OR have quit w/in past 15 years) Hepatitis C Screening recommended: no HIV Screening recommended: no  Advanced Directives: Written information was not prepared per patient's request.  Referrals & Orders No orders of the defined types were placed in this encounter.   Follow-up Plan . Follow-up with Dettinger, Fransisca Kaufmann, MD as planned . Appointment given for today at 2:15pm for flu shot.  . AWV printed and mailed to patient   I have personally reviewed and noted the following in the patient's chart:   . Medical and social history . Use of alcohol, tobacco or illicit drugs  . Current medications and supplements . Functional ability and status . Nutritional status . Physical activity . Advanced directives . List of other physicians . Hospitalizations, surgeries, and ER visits in previous 12 months . Vitals . Screenings to include cognitive, depression, and falls . Referrals and appointments  In addition, I have reviewed and discussed with Timothy Guerrero certain preventive protocols, quality metrics, and best practice recommendations. A written personalized care plan for preventive services as well as general preventive health recommendations is available and can be mailed to the patient at his request.      Lynnea Ferrier, LPN  [id number]

## 2020-04-01 NOTE — Patient Instructions (Signed)
  Timothy Guerrero Maintenance Summary and Written Plan of Care  [name] ,  Thank you for allowing me to perform your Medicare Annual Wellness Visit and for your ongoing commitment to your health.   Health Maintenance & Immunization History Health Maintenance  Topic Date Due  . INFLUENZA VACCINE  04/01/2020 (Originally 02/01/2020)  . HEMOGLOBIN A1C  05/05/2020  . OPHTHALMOLOGY EXAM  07/06/2020  . FOOT EXAM  11/02/2020  . TETANUS/TDAP  04/24/2026  . Hepatitis C Screening  Completed  . PNA vac Low Risk Adult  Completed  . COVID-19 Vaccine  Discontinued   Immunization History  Administered Date(s) Administered  . Fluad Quad(high Dose 65+) 06/05/2019  . Influenza, High Dose Seasonal PF 04/19/2016, 04/19/2018  . Influenza,inj,Quad PF,6+ Mos 03/31/2015  . Influenza,inj,quad, With Preservative 01/31/2017  . Pneumococcal Conjugate-13 07/30/2015  . Pneumococcal Polysaccharide-23 08/16/2017  . Tdap 04/24/2016    These are the patient goals that we discussed: Goals Addressed            This Visit's Progress   . AWV       04/01/2020 AWV Goal: Fall Prevention  . Over the next year, patient will decrease their risk for falls by: o Using assistive devices, such as a cane or walker, as needed o Identifying fall risks within their home and correcting them by: - Removing throw rugs - Adding handrails to stairs or ramps - Removing clutter and keeping a clear pathway throughout the home - Increasing light, especially at night - Adding shower handles/bars - Raising toilet seat o Identifying potential personal risk factors for falls: - Medication side effects - Incontinence/urgency - Vestibular dysfunction - Hearing loss - Musculoskeletal disorders - Neurological disorders - Orthostatic hypotension  04/01/2020 AWV Goal: Diabetes Management  . Patient will maintain an A1C level below 8.0 . Patient will not develop any diabetic foot complications . Patient  will not experience any hypoglycemic episodes over the next 3 months . Patient will notify our office of any CBG readings outside of the provider recommended range by calling [phone] . Patient will adhere to provider recommendations for diabetes management  Patient Self Management Activities . take all medications as prescribed and report any negative side effects . monitor and record blood sugar readings as directed . adhere to a low carbohydrate diet that incorporates lean proteins, vegetables, whole grains, low glycemic fruits . check feet daily noting any sores, cracks, injuries, or callous formations . see PCP or podiatrist if he notices any changes in his legs, feet, or toenails . Patient will visit PCP and have an A1C level checked every 3 to 6 months as directed  . have a yearly eye exam to monitor for vascular changes associated with diabetes and will request that the report be sent to his pcp.  . consult with his PCP regarding any changes in his health or new or worsening symptoms         This is a list of Health Maintenance Items that are overdue or due now: There are no preventive care reminders to display for this patient.   Orders/Referrals Placed Today: No orders of the defined types were placed in this encounter.  (Contact our referral department at [phone] if you have not spoken with someone about your referral appointment within the next 5 days)    Follow-up Plan . Follow-up with Dettinger, Fransisca Kaufmann, MD as planned Appointment given for today at 2:15pm for flu shot.

## 2020-06-02 DIAGNOSIS — L814 Other melanin hyperpigmentation: Secondary | ICD-10-CM | POA: Diagnosis not present

## 2020-06-02 DIAGNOSIS — Z85820 Personal history of malignant melanoma of skin: Secondary | ICD-10-CM | POA: Diagnosis not present

## 2020-06-02 DIAGNOSIS — D1801 Hemangioma of skin and subcutaneous tissue: Secondary | ICD-10-CM | POA: Diagnosis not present

## 2020-06-02 DIAGNOSIS — Z85828 Personal history of other malignant neoplasm of skin: Secondary | ICD-10-CM | POA: Diagnosis not present

## 2020-06-22 ENCOUNTER — Ambulatory Visit (INDEPENDENT_AMBULATORY_CARE_PROVIDER_SITE_OTHER): Payer: Medicare HMO | Admitting: Family

## 2020-06-22 ENCOUNTER — Encounter: Payer: Self-pay | Admitting: Family

## 2020-06-22 DIAGNOSIS — J449 Chronic obstructive pulmonary disease, unspecified: Secondary | ICD-10-CM | POA: Diagnosis not present

## 2020-06-22 MED ORDER — AZITHROMYCIN 250 MG PO TABS: ORAL_TABLET | ORAL | 0 refills | Status: DC

## 2020-06-22 NOTE — Progress Notes (Signed)
   Virtual Visit via telephone Note Due to COVID-19 pandemic this visit was conducted virtually. This visit type was conducted due to national recommendations for restrictions regarding the COVID-19 Pandemic (e.g. social distancing, sheltering in place) in an effort to limit this patient's exposure and mitigate transmission in our community. All issues noted in this document were discussed and addressed.  A physical exam was not performed with this format.  I connected with Timothy Guerrero on 06/22/20 at 2:00 pm  by telephone and verified that I am speaking with the correct person using two identifiers. Timothy Guerrero is currently located at car and no one is currently with him during visit. The provider, Evelina Dun, FNP is located in their office at time of visit.  I discussed the limitations, risks, security and privacy concerns of performing an evaluation and management service by telephone and the availability of in person appointments. I also discussed with the patient that there may be a patient responsible charge related to this service. The patient expressed understanding and agreed to proceed.   History and Present Illness:  Pt calls the office today with cough and fever that started Friday. He reports he had a negative  COVID test.  Cough This is a new problem. The current episode started in the past 7 days. The problem has been gradually worsening. The problem occurs every few minutes. The cough is non-productive. Associated symptoms include a fever and myalgias. Pertinent negatives include no ear congestion, ear pain, headaches, nasal congestion, postnasal drip, sore throat, shortness of breath or wheezing. He has tried rest for the symptoms.      Review of Systems  Constitutional: Positive for fever.  HENT: Negative for ear pain, postnasal drip and sore throat.   Respiratory: Positive for cough. Negative for shortness of breath and wheezing.   Musculoskeletal: Positive for  myalgias.  Neurological: Negative for headaches.     Observations/Objective: No SOB or distress noted   Assessment and Plan: 1. Chronic obstructive pulmonary disease, unspecified COPD type ([place]) - Take meds as prescribed - Use a cool mist humidifier  -Use saline nose sprays frequently -Force fluids -For any cough or congestion  Use plain Mucinex- regular strength or max strength is fine -For fever or aces or pains- take tylenol or ibuprofen. -Throat lozenges if help -Call if symptoms worsen or do not improve  - azithromycin (ZITHROMAX) 250 MG tablet; Take 500 mg once, then 250 mg for four days  Dispense: 6 tablet; Refill: 0     I discussed the assessment and treatment plan with the patient. The patient was provided an opportunity to ask questions and all were answered. The patient agreed with the plan and demonstrated an understanding of the instructions.   The patient was advised to call back or seek an in-person evaluation if the symptoms worsen or if the condition fails to improve as anticipated.  The above assessment and management plan was discussed with the patient. The patient verbalized understanding of and has agreed to the management plan. Patient is aware to call the clinic if symptoms persist or worsen. Patient is aware when to return to the clinic for a follow-up visit. Patient educated on when it is appropriate to go to the emergency department.   Time call ended:  2:11 pm   I provided 11 minutes of non-face-to-face time during this encounter.    Evelina Dun, FNP

## 2020-06-26 ENCOUNTER — Other Ambulatory Visit: Payer: Self-pay

## 2020-06-26 ENCOUNTER — Emergency Department (HOSPITAL_COMMUNITY): Payer: No Typology Code available for payment source

## 2020-06-26 ENCOUNTER — Observation Stay (HOSPITAL_COMMUNITY)
Admission: EM | Admit: 2020-06-26 | Discharge: 2020-06-27 | Disposition: A | Discharge status: Home / Self Care | Payer: No Typology Code available for payment source | Attending: Internal Medicine | Admitting: Internal Medicine

## 2020-06-26 ENCOUNTER — Encounter (HOSPITAL_COMMUNITY): Payer: Self-pay | Admitting: *Deleted

## 2020-06-26 DIAGNOSIS — I110 Hypertensive heart disease with heart failure: Secondary | ICD-10-CM | POA: Insufficient documentation

## 2020-06-26 DIAGNOSIS — G4730 Sleep apnea, unspecified: Secondary | ICD-10-CM | POA: Diagnosis present

## 2020-06-26 DIAGNOSIS — I480 Paroxysmal atrial fibrillation: Secondary | ICD-10-CM | POA: Diagnosis present

## 2020-06-26 DIAGNOSIS — U071 COVID-19: Principal | ICD-10-CM | POA: Insufficient documentation

## 2020-06-26 DIAGNOSIS — F339 Major depressive disorder, recurrent, unspecified: Secondary | ICD-10-CM | POA: Diagnosis present

## 2020-06-26 DIAGNOSIS — Z96642 Presence of left artificial hip joint: Secondary | ICD-10-CM | POA: Insufficient documentation

## 2020-06-26 DIAGNOSIS — J1282 Pneumonia due to coronavirus disease 2019: Secondary | ICD-10-CM | POA: Diagnosis not present

## 2020-06-26 DIAGNOSIS — Z79899 Other long term (current) drug therapy: Secondary | ICD-10-CM | POA: Diagnosis not present

## 2020-06-26 DIAGNOSIS — K219 Gastro-esophageal reflux disease without esophagitis: Secondary | ICD-10-CM | POA: Diagnosis present

## 2020-06-26 DIAGNOSIS — Z7951 Long term (current) use of inhaled steroids: Secondary | ICD-10-CM | POA: Insufficient documentation

## 2020-06-26 DIAGNOSIS — J449 Chronic obstructive pulmonary disease, unspecified: Secondary | ICD-10-CM | POA: Insufficient documentation

## 2020-06-26 DIAGNOSIS — E119 Type 2 diabetes mellitus without complications: Secondary | ICD-10-CM | POA: Insufficient documentation

## 2020-06-26 DIAGNOSIS — Z96612 Presence of left artificial shoulder joint: Secondary | ICD-10-CM | POA: Insufficient documentation

## 2020-06-26 DIAGNOSIS — Z8546 Personal history of malignant neoplasm of prostate: Secondary | ICD-10-CM | POA: Insufficient documentation

## 2020-06-26 DIAGNOSIS — I509 Heart failure, unspecified: Secondary | ICD-10-CM | POA: Insufficient documentation

## 2020-06-26 DIAGNOSIS — Z9079 Acquired absence of other genital organ(s): Secondary | ICD-10-CM | POA: Diagnosis not present

## 2020-06-26 DIAGNOSIS — R509 Fever, unspecified: Secondary | ICD-10-CM | POA: Diagnosis present

## 2020-06-26 DIAGNOSIS — Z96653 Presence of artificial knee joint, bilateral: Secondary | ICD-10-CM | POA: Insufficient documentation

## 2020-06-26 DIAGNOSIS — Z794 Long term (current) use of insulin: Secondary | ICD-10-CM | POA: Insufficient documentation

## 2020-06-26 DIAGNOSIS — Z85118 Personal history of other malignant neoplasm of bronchus and lung: Secondary | ICD-10-CM | POA: Diagnosis not present

## 2020-06-26 DIAGNOSIS — J1281 Pneumonia due to SARS-associated coronavirus: Secondary | ICD-10-CM | POA: Diagnosis not present

## 2020-06-26 DIAGNOSIS — J189 Pneumonia, unspecified organism: Secondary | ICD-10-CM

## 2020-06-26 DIAGNOSIS — D509 Iron deficiency anemia, unspecified: Secondary | ICD-10-CM | POA: Diagnosis present

## 2020-06-26 DIAGNOSIS — E039 Hypothyroidism, unspecified: Secondary | ICD-10-CM | POA: Diagnosis not present

## 2020-06-26 DIAGNOSIS — I10 Essential (primary) hypertension: Secondary | ICD-10-CM | POA: Diagnosis present

## 2020-06-26 LAB — RESP PANEL BY RT-PCR (FLU A&B, COVID) ARPGX2
Influenza A by PCR: NEGATIVE
Influenza B by PCR: NEGATIVE
SARS Coronavirus 2 by RT PCR: POSITIVE — AB

## 2020-06-26 LAB — BASIC METABOLIC PANEL
Anion gap: 9 (ref 5–15)
BUN: 16 mg/dL (ref 8–23)
CO2: 24 mmol/L (ref 22–32)
Calcium: 8.3 mg/dL — ABNORMAL LOW (ref 8.9–10.3)
Chloride: 102 mmol/L (ref 98–111)
Creatinine, Ser: 0.85 mg/dL (ref 0.61–1.24)
GFR, Estimated: >60 mL/min (ref >60)
Glucose, Bld: 233 mg/dL — ABNORMAL HIGH (ref 70–99)
Potassium: 3.8 mmol/L (ref 3.5–5.1)
Sodium: 135 mmol/L (ref 135–145)

## 2020-06-26 LAB — C-REACTIVE PROTEIN: CRP: 11.5 mg/dL — ABNORMAL HIGH (ref <1.0)

## 2020-06-26 LAB — FERRITIN: Ferritin: 33 ng/mL (ref 24–336)

## 2020-06-26 LAB — CBC
HCT: 32.6 % — ABNORMAL LOW (ref 39.0–52.0)
Hemoglobin: 9.3 g/dL — ABNORMAL LOW (ref 13.0–17.0)
MCH: 21.2 pg — ABNORMAL LOW (ref 26.0–34.0)
MCHC: 28.5 g/dL — ABNORMAL LOW (ref 30.0–36.0)
MCV: 74.3 fL — ABNORMAL LOW (ref 80.0–100.0)
Platelets: 202 10*3/uL (ref 150–400)
RBC: 4.39 MIL/uL (ref 4.22–5.81)
RDW: 19.9 % — ABNORMAL HIGH (ref 11.5–15.5)
WBC: 7.2 10*3/uL (ref 4.0–10.5)
nRBC: 0 % (ref 0.0–0.2)

## 2020-06-26 LAB — BRAIN NATRIURETIC PEPTIDE: B Natriuretic Peptide: 81 pg/mL (ref 0.0–100.0)

## 2020-06-26 LAB — HEPATIC FUNCTION PANEL
ALT: 13 U/L (ref 0–44)
AST: 21 U/L (ref 15–41)
Albumin: 3.1 g/dL — ABNORMAL LOW (ref 3.5–5.0)
Alkaline Phosphatase: 38 U/L (ref 38–126)
Bilirubin, Direct: 0.1 mg/dL (ref 0.0–0.2)
Indirect Bilirubin: 0.4 mg/dL (ref 0.3–0.9)
Total Bilirubin: 0.5 mg/dL (ref 0.3–1.2)
Total Protein: 6.4 g/dL — ABNORMAL LOW (ref 6.5–8.1)

## 2020-06-26 LAB — PHOSPHORUS: Phosphorus: 2.2 mg/dL — ABNORMAL LOW (ref 2.5–4.6)

## 2020-06-26 LAB — PROCALCITONIN: Procalcitonin: <0.1 ng/mL

## 2020-06-26 LAB — CBG MONITORING, ED: Glucose-Capillary: 183 mg/dL — ABNORMAL HIGH (ref 70–99)

## 2020-06-26 LAB — D-DIMER, QUANTITATIVE: D-Dimer, Quant: 0.62 ug/mL-FEU — ABNORMAL HIGH (ref 0.00–0.50)

## 2020-06-26 LAB — MAGNESIUM: Magnesium: 1.8 mg/dL (ref 1.7–2.4)

## 2020-06-26 MED ORDER — ONDANSETRON HCL 4 MG PO TABS: 4.0000 mg | ORAL_TABLET | Freq: Four times a day (QID) | ORAL | Status: DC | PRN

## 2020-06-26 MED ORDER — DEXAMETHASONE SODIUM PHOSPHATE 10 MG/ML IJ SOLN
6.0000 mg | INTRAMUSCULAR | Status: DC
  Administered 2020-06-26: 6 mg via INTRAVENOUS
  Filled 2020-06-26: qty 1

## 2020-06-26 MED ORDER — SODIUM CHLORIDE 0.9 % IV SOLN: 100.0000 mg | INTRAVENOUS | Status: AC

## 2020-06-26 MED ORDER — ASCORBIC ACID 500 MG PO TABS
500.0000 mg | ORAL_TABLET | Freq: Every day | ORAL | Status: DC
  Administered 2020-06-26 – 2020-06-27 (×2): 500 mg via ORAL
  Filled 2020-06-26 (×2): qty 1

## 2020-06-26 MED ORDER — EZETIMIBE 10 MG PO TABS
10.0000 mg | ORAL_TABLET | Freq: Every day | ORAL | Status: DC
  Administered 2020-06-27: 10 mg via ORAL
  Filled 2020-06-26 (×2): qty 1

## 2020-06-26 MED ORDER — HYDROCOD POLST-CPM POLST ER 10-8 MG/5ML PO SUER: 5.0000 mL | Freq: Two times a day (BID) | ORAL | Status: DC | PRN

## 2020-06-26 MED ORDER — ACETAMINOPHEN 325 MG PO TABS: 650.0000 mg | ORAL_TABLET | Freq: Four times a day (QID) | ORAL | Status: DC | PRN

## 2020-06-26 MED ORDER — LINAGLIPTIN 5 MG PO TABS
5.0000 mg | ORAL_TABLET | Freq: Every day | ORAL | Status: DC
  Administered 2020-06-27 (×2): 5 mg via ORAL
  Filled 2020-06-26 (×3): qty 1

## 2020-06-26 MED ORDER — LOSARTAN POTASSIUM 25 MG PO TABS
25.0000 mg | ORAL_TABLET | Freq: Every day | ORAL | Status: DC
  Administered 2020-06-27: 25 mg via ORAL
  Filled 2020-06-26: qty 1

## 2020-06-26 MED ORDER — ACETAMINOPHEN 500 MG PO TABS
1000.0000 mg | ORAL_TABLET | Freq: Once | ORAL | Status: AC
  Administered 2020-06-26: 1000 mg via ORAL
  Filled 2020-06-26: qty 2

## 2020-06-26 MED ORDER — ALBUTEROL SULFATE HFA 108 (90 BASE) MCG/ACT IN AERS
2.0000 | INHALATION_SPRAY | Freq: Four times a day (QID) | RESPIRATORY_TRACT | Status: DC
  Administered 2020-06-26 – 2020-06-27 (×3): 2 via RESPIRATORY_TRACT
  Filled 2020-06-26 (×2): qty 6.7

## 2020-06-26 MED ORDER — SODIUM CHLORIDE 0.9 % IV SOLN
2.0000 g | Freq: Once | INTRAVENOUS | Status: AC
  Administered 2020-06-26: 2 g via INTRAVENOUS
  Filled 2020-06-26: qty 20

## 2020-06-26 MED ORDER — INSULIN ASPART PROT & ASPART (70-30 MIX) 100 UNIT/ML ~~LOC~~ SUSP
50.0000 [IU] | Freq: Two times a day (BID) | SUBCUTANEOUS | Status: DC
  Administered 2020-06-27: 50 [IU] via SUBCUTANEOUS
  Filled 2020-06-26: qty 10

## 2020-06-26 MED ORDER — ACETAMINOPHEN 650 MG RE SUPP: 650.0000 mg | Freq: Four times a day (QID) | RECTAL | Status: DC | PRN

## 2020-06-26 MED ORDER — ZINC SULFATE 220 (50 ZN) MG PO CAPS
220.0000 mg | ORAL_CAPSULE | Freq: Every day | ORAL | Status: DC
  Administered 2020-06-26 – 2020-06-27 (×2): 220 mg via ORAL
  Filled 2020-06-26 (×2): qty 1

## 2020-06-26 MED ORDER — SODIUM CHLORIDE 0.9 % IV SOLN: 100.0000 mg | Freq: Every day | INTRAVENOUS | Status: DC

## 2020-06-26 MED ORDER — GABAPENTIN 300 MG PO CAPS
300.0000 mg | ORAL_CAPSULE | Freq: Three times a day (TID) | ORAL | Status: DC
Start: 2020-06-27 — End: 2020-06-27
  Administered 2020-06-27 (×2): 300 mg via ORAL
  Filled 2020-06-26 (×2): qty 1

## 2020-06-26 MED ORDER — SODIUM CHLORIDE 0.9 % IV BOLUS
500.0000 mL | Freq: Once | INTRAVENOUS | Status: AC
  Administered 2020-06-26: 500 mL via INTRAVENOUS

## 2020-06-26 MED ORDER — LEVOTHYROXINE SODIUM 50 MCG PO TABS
50.0000 ug | ORAL_TABLET | Freq: Every day | ORAL | Status: DC
  Administered 2020-06-27: 50 ug via ORAL
  Filled 2020-06-26: qty 1

## 2020-06-26 MED ORDER — SODIUM CHLORIDE 0.9 % IV SOLN
500.0000 mg | INTRAVENOUS | Status: DC
  Administered 2020-06-26: 500 mg via INTRAVENOUS
  Filled 2020-06-26: qty 500

## 2020-06-26 MED ORDER — GUAIFENESIN-DM 100-10 MG/5ML PO SYRP: 10.0000 mL | ORAL_SOLUTION | ORAL | Status: DC | PRN

## 2020-06-26 MED ORDER — INSULIN ASPART 100 UNIT/ML ~~LOC~~ SOLN
0.0000 [IU] | SUBCUTANEOUS | Status: DC
  Administered 2020-06-27: 7 [IU] via SUBCUTANEOUS
  Administered 2020-06-27: 4 [IU] via SUBCUTANEOUS
  Administered 2020-06-27: 11 [IU] via SUBCUTANEOUS
  Filled 2020-06-26 (×3): qty 1

## 2020-06-26 MED ORDER — INSULIN DETEMIR 100 UNIT/ML ~~LOC~~ SOLN
0.1500 [IU]/kg | Freq: Two times a day (BID) | SUBCUTANEOUS | Status: DC
  Filled 2020-06-26 (×4): qty 0.13

## 2020-06-26 MED ORDER — ENOXAPARIN SODIUM 40 MG/0.4ML ~~LOC~~ SOLN
40.0000 mg | SUBCUTANEOUS | Status: DC
  Administered 2020-06-26: 40 mg via SUBCUTANEOUS
  Filled 2020-06-26: qty 0.4

## 2020-06-26 MED ORDER — SODIUM CHLORIDE 0.9 % IV SOLN
200.0000 mg | Freq: Once | INTRAVENOUS | Status: DC
  Filled 2020-06-26: qty 40

## 2020-06-26 MED ORDER — ONDANSETRON HCL 4 MG/2ML IJ SOLN: 4.0000 mg | Freq: Four times a day (QID) | INTRAMUSCULAR | Status: DC | PRN

## 2020-06-26 MED ORDER — METOPROLOL SUCCINATE ER 50 MG PO TB24
200.0000 mg | ORAL_TABLET | Freq: Every day | ORAL | Status: DC
  Administered 2020-06-27: 200 mg via ORAL
  Filled 2020-06-26 (×2): qty 2

## 2020-06-26 MED ORDER — VITAMIN D 25 MCG (1000 UNIT) PO TABS
5000.0000 [IU] | ORAL_TABLET | Freq: Every day | ORAL | Status: DC
  Administered 2020-06-27: 5000 [IU] via ORAL
  Filled 2020-06-26: qty 5

## 2020-06-26 MED ORDER — VALACYCLOVIR HCL 500 MG PO TABS: 1000.0000 mg | ORAL_TABLET | Freq: Every day | ORAL | Status: DC | PRN

## 2020-06-26 MED ORDER — PANTOPRAZOLE SODIUM 40 MG PO TBEC
40.0000 mg | DELAYED_RELEASE_TABLET | Freq: Every day | ORAL | Status: DC
  Administered 2020-06-27: 40 mg via ORAL
  Filled 2020-06-26: qty 1

## 2020-06-26 NOTE — ED Notes (Signed)
Pt. States that " My daughter who is a dentist told me to not take any medications that start with an R for covid." Pt. Is currently wishing to not take any medications that start with an R.

## 2020-06-26 NOTE — H&P (Signed)
History and Physical    Timothy Guerrero MVH:[id number] DOB: 08-11-1940 DOA: 06/26/2020  PCP: Dettinger, Fransisca Kaufmann, MD   Patient coming from: Home.   I have personally briefly reviewed patient's old medical records in [place]  Chief Complaint: Fever for a week.  HPI: Timothy Guerrero is a 79 y.o. male with medical history significant of microcytic anemia, osteoarthritis, paroxysmal atrial fibrillation/atrial flutter, benign essential tremor, history of blood transfusion, cataract, chronic diastolic CHF, COPD, type 2 diabetes, hypercholesterolemia, hypertension, lung cancer with history of RLL lobectomy complicated by postop atrial flutter, history of prostate cancer, skin melanoma, sleep apnea not on CPAP who is coming to the emergency department due to fever for a week associated with 3-4 episodes of loose stools daily, decreased appetite, fatigue, malaise, hypersomnia (sleeping 10 to 12 hours a day) and occasionally productive cough.  He denies headache, rhinorrhea, sore throat, wheezing or hemoptysis.  No chest pain, palpitations, diaphoresis, PND, orthopnea or pitting edema of the lower extremities.  He has felt mildly lightheaded at times.  He denies abdominal pain, emesis, constipation, melena or hematochezia.  No dysuria, frequency or hematuria.  Denies polyuria, polydipsia, polyphagia or blurred vision.  ED Course: Initial vital signs were temperature 98.8 F, pulse 109, respiration 18, BP 121/90 mmHg O2 sat 90% on room air.  The patient received a 500 mL NS bolus, ceftriaxone and azithromycin IVPB.  The patient declined the remdesivir infusion.  Labwork: CBC shows a white count 7.2, hemoglobin 9.3 g/dL and platelets 202.  In early August, hemoglobin lites 11.8 g/dL.  D-dimer was 0.62 mcg/mL.  Improved problem BMP/chemistry shows a glucose of 233, phosphorus was 2.2 and calcium of 8.3 mg/dL.  Renal function was normal.  LFTs showed total albumin 6.4 and albumin of 3.1 g/dL, the  rest of the hepatic functions are within expected range.  CRP was 11.5 mg/dL.  Coronavirus PCR was positive.  Influenza PCR was negative.  Flu calcitonin was less than 0.10 ng/mL.  Imaging: Radiological findings consistent for left midlung pneumonia.  Please see image and full regular report for further detail.  Review of Systems: As per HPI otherwise all other systems reviewed and are negative.  Past Medical History:  Diagnosis Date  . Anemia   . Arthritis   . Atrial flutter ([place])    post op following lobectomy  . Benign essential tremor   . Blood transfusion without reported diagnosis   . Cataract   . CHF (congestive heart failure) ([place])   . Colon polyp    2007, polyp with early cancer, one year f/u no residual polyp. next TCS 2010, see PSH. [facility].  [name] COPD (chronic obstructive pulmonary disease) ([place])   . Diabetes mellitus without complication ([place])    10 years  . GERD (gastroesophageal reflux disease)   . HOH (hard of hearing)   . Hypercholesteremia   . Hypertension    10 years  . Lung cancer ([place])   . Prostate cancer ([place])   . Shortness of breath dyspnea    due to lung mass  . Skin melanoma ([place])   . Sleep apnea    wears CPAP sometimes, cannot tolerate all the time.   Past Surgical History:  Procedure Laterality Date  . bottom lobe of right lung removed  12/17/14  . CARDIAC CATHETERIZATION     no PCI  . CATARACT EXTRACTION Left   . CATARACT EXTRACTION W/PHACO Left 01/03/2016   Procedure: CATARACT EXTRACTION PHACO  AND INTRAOCULAR LENS PLACEMENT LEFT EYE CDE=7.78;  Surgeon: Williams Che, MD;  Location: [facility];  Service: Ophthalmology;  Laterality: Left;  . CATARACT EXTRACTION W/PHACO Right 03/20/2016   Procedure: CATARACT EXTRACTION PHACO AND INTRAOCULAR LENS PLACEMENT; CDE:  9.58;  Surgeon: Williams Che, MD;  Location: [facility];  Service: Ophthalmology;  Laterality: Right;  . COLONOSCOPY  08/2008   Dr. Leafy Half, [place]:  normal, internal grade 1 hemorrhoids. next TCS 08/2013  . COLONOSCOPY N/A 03/04/2014   Procedure: COLONOSCOPY;  Surgeon: Daneil Dolin, MD;  Location: [facility];  Service: Endoscopy;  Laterality: N/A;  7:30am  . EYE SURGERY    . knee replacements     bilateral. over 10 years ago  . LOBECTOMY Right 12/17/2014   Procedure: RIGHT LOWER LOBECTOMY;  Surgeon: Melrose Nakayama, MD;  Location: [place];  Service: Thoracic;  Laterality: Right;  . LYMPH NODE DISSECTION Right 12/17/2014   Procedure: LYMPH NODE DISSECTION;  Surgeon: Melrose Nakayama, MD;  Location: [place];  Service: Thoracic;  Laterality: Right;  . NECK SURGERY     melanoma removed.  [name] PROSTATE SURGERY     prostatectomy, radical  . TOTAL HIP ARTHROPLASTY  2007   left  . total shoulder replacement  2011   left  . VIDEO ASSISTED THORACOSCOPY (VATS)/WEDGE RESECTION Right 12/17/2014   Procedure: RIGHT VIDEO ASSISTED THORACOSCOPY (VATS);  Surgeon: Melrose Nakayama, MD;  Location: [place];  Service: Thoracic;  Laterality: Right;  [name] VIDEO BRONCHOSCOPY Bilateral 11/17/2016   Procedure: VIDEO BRONCHOSCOPY WITHOUT FLUORO;  Surgeon: Javier Glazier, MD;  Location: [facility];  Service: Cardiopulmonary;  Laterality: Bilateral;  . VIDEO BRONCHOSCOPY N/A 11/01/2017   Procedure: VIDEO BRONCHOSCOPY;  Surgeon: Melrose Nakayama, MD;  Location: [facility];  Service: Thoracic;  Laterality: N/A;   Social History  reports that he quit smoking about 34 years ago. His smoking use included cigarettes. He has a 64.00 pack-year smoking history. He has never used smokeless tobacco. He reports current alcohol use. He reports that he does not use drugs.  No Known Allergies  Family History  Problem Relation Age of Onset  . Cancer Mother        unknown type  . COPD Sister   . Breast cancer Sister   . Cancer Sister 68       breast  . Heart attack Daughter   . Prostate cancer Son   . Colon cancer Neg Hx    Prior to Admission medications    Medication Sig Start Date End Date Taking? Authorizing Provider  acetaminophen (TYLENOL) 325 MG tablet Take 2 tablets (650 mg total) by mouth every 6 (six) hours as needed for mild pain, fever or headache (or Fever >/= 101). 03/11/19   Emokpae, Courage, MD  albuterol (PROVENTIL HFA;VENTOLIN HFA) 108 (90 Base) MCG/ACT inhaler Inhale 1 puff into the lungs every 6 (six) hours as needed for wheezing or shortness of breath.    [provider]  amLODipine (NORVASC) 10 MG tablet TAKE 1 TABLET BY MOUTH EVERY DAY 07/24/19   Dettinger, Fransisca Kaufmann, MD  ASCORBIC ACID PO Take by mouth.    [provider]  azithromycin (ZITHROMAX) 250 MG tablet Take 500 mg once, then 250 mg for four days 06/22/20   Evelina Dun A, FNP  cholecalciferol (VITAMIN D3) 25 MCG (1000 UT) tablet Take 1,000 Units by mouth daily.    [provider]  Cyanocobalamin (VITAMIN B-12 PO) Take by  mouth.    [provider]  doxycycline (VIBRAMYCIN) 100 MG capsule Take 100 mg by mouth 2 (two) times daily. 01/28/20   [provider]  ezetimibe (ZETIA) 10 MG tablet Take 1 tablet (10 mg total) by mouth daily. Patient not taking: Reported on 04/01/2020 03/17/20 06/15/20  Minus Breeding, MD  fluticasone furoate-vilanterol (BREO ELLIPTA) 200-25 MCG/INH AEPB Inhale 1 puff into the lungs daily. 11/03/19   Dettinger, Fransisca Kaufmann, MD  furosemide (LASIX) 20 MG tablet Take 1 tablet (20 mg total) by mouth daily. 11/03/19   Dettinger, Fransisca Kaufmann, MD  gabapentin (NEURONTIN) 300 MG capsule Take 300 mg by mouth 3 (three) times daily.    [provider]  insulin aspart protamine- aspart (NOVOLOG MIX 70/30) (70-30) 100 UNIT/ML injection Inject 50 Units into the skin 2 (two) times daily.     [provider]  levothyroxine (SYNTHROID) 50 MCG tablet Take 1 tablet (50 mcg total) by mouth daily before breakfast. ((for thyroid)) 11/03/19   Dettinger, Fransisca Kaufmann, MD  losartan (COZAAR) 50 MG tablet Take 25 mg by mouth daily.   01/03/19   [provider]  magnesium oxide (MAG-OX) 400 MG tablet Take 400 mg by mouth 2 (two) times daily.    [provider]  metFORMIN (GLUCOPHAGE) 1000 MG tablet Take 1 tablet (1,000 mg total) by mouth 2 (two) times daily with a meal. Needs to be seen before next refill. 11/03/19   Dettinger, Fransisca Kaufmann, MD  metoprolol (TOPROL-XL) 200 MG 24 hr tablet Take 1 tablet (200 mg total) by mouth daily. 11/03/19   Dettinger, Fransisca Kaufmann, MD  mupirocin ointment (BACTROBAN) 2 % APPLY TO AFFECTED AREA EVERY DAY 06/23/19   [provider]  pantoprazole (PROTONIX) 40 MG tablet Take 1 tablet (40 mg total) by mouth daily. 11/03/19   Dettinger, Fransisca Kaufmann, MD  Potassium Chloride ER 20 MEQ TBCR Take 20 mEq by mouth daily. Take only on days when taking lasix 11/03/19 04/01/20  Dettinger, Fransisca Kaufmann, MD  rosuvastatin (CRESTOR) 40 MG tablet Take 1 tablet (40 mg total) by mouth daily. 03/10/20 06/08/20  Minus Breeding, MD  Semaglutide, 1 MG/DOSE, (OZEMPIC, 1 MG/DOSE,) 2 MG/1.5ML SOPN Inject 1 mg into the skin once a week. 11/03/19   Dettinger, Fransisca Kaufmann, MD  Tiotropium Bromide-Olodaterol (STIOLTO RESPIMAT IN) Inhale 1 puff into the lungs daily as needed (shortness of breath).    [provider]  triamcinolone cream (KENALOG) 0.1 % Apply 1 application topically 2 (two) times daily. 01/19/18   [name], MD  valACYclovir (VALTREX) 1000 MG tablet Take 1,000 mg by mouth daily as needed (cold sores).     [provider]   Physical Exam: Vitals:   06/26/20 1900 06/26/20 1930 06/26/20 2000 06/26/20 2030  BP: 125/68 127/66 134/66 119/64  Pulse: 96 (!) 106 91 89  Resp: (!) 30 (!) 23 (!) 21 (!) 27  Temp:      TempSrc:      SpO2: 95% 93% 97% 96%  Weight:      Height:       Constitutional: Looks acutely ill, but nontoxic. Eyes: PERRL, lids and conjunctivae mildly injected. ENMT: Mucous membranes are moist. Posterior pharynx clear of any exudate or lesions. Neck: normal, supple, no masses,  no thyromegaly Respiratory: Bilateral scattered crackles [place] with mild wheezing. Normal respiratory effort. No accessory muscle use.  Cardiovascular: Regular rate and rhythm, no murmurs / rubs / gallops. No extremity edema. 2+ pedal pulses. No carotid bruits.  Abdomen:  Nondistended.  Bowel sounds positive.  Soft, no tenderness, no masses palpated. No hepatosplenomegaly. Musculoskeletal: no clubbing / cyanosis. Good ROM, no contractures. Normal muscle tone.  Skin: no rashes, lesions, ulcers on very limited dermatological examination. Neurologic: CN 2-12 grossly intact. Sensation intact, DTR normal. Strength 5/5 in all 4.  Psychiatric: Normal judgment and insight. Alert and oriented x 3. Normal mood.   Labs on Admission: I have personally reviewed following labs and imaging studies  CBC: Recent Labs  Lab 06/26/20 1844  WBC 7.2  HGB 9.3*  HCT 32.6*  MCV 74.3*  PLT 469    Basic Metabolic Panel: Recent Labs  Lab 06/26/20 1844  NA 135  K 3.8  CL 102  CO2 24  GLUCOSE 233*  BUN 16  CREATININE 0.85  CALCIUM 8.3*  MG 1.8  PHOS 2.2*    GFR: Estimated Creatinine Clearance: 77.3 mL/min (by C-G formula based on SCr of 0.85 mg/dL).  Liver Function Tests: No results for input(s): AST, ALT, ALKPHOS, BILITOT, PROT, ALBUMIN in the last 168 hours.  Radiological Exams on Admission: DG Chest Port 1 View  Result Date: 06/26/2020 CLINICAL DATA:  Pain. EXAM: PORTABLE CHEST 1 VIEW COMPARISON:  03/09/2019 FINDINGS: There is an airspace opacity in the left mid lung zone, new since prior study. The heart size is stable. Aortic calcifications are noted. There is no pneumothorax. Chronic interstitial lung markings are noted. There is a trace right-sided pleural effusion. There are advanced degenerative changes of both glenohumeral joints. Postsurgical changes related to right lower lobe lobectomy. Emphysematous changes are noted IMPRESSION: 1. Findings consistent with left mid lung zone pneumonia  (viral or bacterial). 2. There are postsurgical changes related prior right lower lobe lobectomy. 3. Aortic Atherosclerosis (ICD10-I70.0) and Emphysema (ICD10-J43.9). Electronically Signed   By: Constance Holster M.D.   On: 06/26/2020 18:12   03/10/2019 IMPRESSIONS   1. The left ventricle has normal systolic function with an ejection  fraction of 60-65%. The cavity size was normal. There is mildly increased  left ventricular wall thickness. Left ventricular diastolic Doppler  parameters are consistent with impaired  relaxation.  2. The right ventricle has normal systolic function. The cavity was  normal. There is no increase in right ventricular wall thickness. Right  ventricular systolic pressure is normal with an estimated pressure of 34.4  mmHg.  3. The aortic valve was not well visualized. Mild sclerosis of the aortic  valve.  4. The aorta is normal unless otherwise noted.  5. The inferior vena cava was normal in size with <50% respiratory  variability.   EKG: Independently reviewed.  Vent. rate 97 BPM PR interval * ms QRS duration 92 ms QT/QTc 353/449 ms P-R-T axes 51 -41 31 Sinus rhythm Left axis deviation Minimal ST depression, inferior leads  Assessment/Plan Principal Problem:   Pneumonia due to COVID-19 virus Observation/telemetry. Continue supplemental oxygen. Flutter valve and incentive spirometry. Continue albuterol MDI 2 puffs every 6 hours. Analgesics as needed. Antiemetics as needed. Antitussives as needed Continue dexamethasone 6 mg IVP daily. Remdesivir per pharmacy. (Patient declined remdesivir earlier). Follow CBC, CMP and inflammatory markers.  Active Problems:   COPD (chronic obstructive pulmonary disease) ([place]) Pneumonia treatment as above. Supplemental oxygen bronchodilators as needed.    Type 2 diabetes mellitus (HCC) Carbohydrate modified diet. Continue 70/30 insulin 50 units SQ twice daily. Continue Metformin 1000 mg p.o. twice  daily. No longer on Crestor for hyperlipidemia.    Essential hypertension Continue losartan 25 mg p.o. daily. Continue amlodipine 10 mg  p.o. daily. Continue metoprolol succinate 200 mg p.o. daily. Monitor BP, HR, renal function electrolytes.    Sleep apnea Not on CPAP.    Hypothyroidism Currently not using levothyroxine Check TSH level.    Depression, recurrent ([place]) No longer on Celexa. No depressive symptoms. Follow-up with PCP as an outpatient.    PAF (paroxysmal atrial fibrillation) (HCC) CHA?DS?-VASc Score of at least 6. (Age > 28, DM, HTN,DCHF and atherosclerosis) Declined to resume anticoagulation despite stroke risk.    Hypophosphatemia K-Phos p.o. Follow-up phosphorus level.    Microcytic anemia Monitor H&H.    GERD (gastroesophageal reflux disease) On pantoprazole 40 mg p.o. daily.   DVT prophylaxis: Lovenox SQ. Code Status:   Full code. Family Communication: Disposition Plan:   Patient is from:  Home.  Anticipated DC to:  Home.  Anticipated DC date:  06/28/2020.  Anticipated DC barriers: Clinical condition. Consults called: Admission status:  Observation/telemetry.  Severity of Illness:  Reubin Milan MD [facility]  How to contact the [facility] Attending or Consulting provider [place] or covering provider during after hours [place], for this patient?   1. Check the care team in [facility] and look for a) attending/consulting [facility] provider listed and b) the [facility] team listed 2. Log into [email or web address] and use [facility]'s universal password to access. If you do not have the password, please contact the hospital operator. 3. Locate the [facility] provider you are looking for under [facility] and page to a number that you can be directly reached. 4. If you still have difficulty reaching the provider, please page the [facility] (Director on Call) for the Hospitalists listed on amion for assistance.  06/26/2020, 9:57 PM   This document was prepared using  Par[employer]ic and may contain some unintended transcription errors.

## 2020-06-26 NOTE — ED Triage Notes (Signed)
Pt here for fever for a week.  Has been tested for covid and was negative per pt. Productive cough.

## 2020-06-26 NOTE — ED Notes (Signed)
Ambulated pt without O2 pt. Dropped to 87. When pt sat down their O2 went to 92 on RA in less than a minute. Pt. Is currently not on O2 and sats are 96 on room air.

## 2020-06-26 NOTE — ED Provider Notes (Signed)
[facility] Emergency Department Provider Note MRN:  [id number]  Arrival date & time: 06/26/20     Chief Complaint   Fever   History of Present Illness   Timothy Guerrero is a 79 y.o. year-old male with a history of diabetes, CHF, lung cancer presenting to the ED with chief complaint of fever.  1 week of persistent fever, shaking chills, productive cough, general malaise, dyspnea on exertion.  Symptoms are constant, moderate, no exacerbating or alleviating factors.  Denies chest pain, no abdominal pain, no numbness or weakness to the arms or legs, no leg pain or swelling.  Review of Systems  A complete 10 system review of systems was obtained and all systems are negative except as noted in the HPI and PMH.   Patient's Health History    Past Medical History:  Diagnosis Date  . Anemia   . Arthritis   . Atrial flutter ([place])    post op following lobectomy  . Benign essential tremor   . Blood transfusion without reported diagnosis   . Cataract   . CHF (congestive heart failure) ([place])   . Colon polyp    2007, polyp with early cancer, one year f/u no residual polyp. next TCS 2010, see PSH. [facility].  [name] COPD (chronic obstructive pulmonary disease) ([place])   . Diabetes mellitus without complication ([place])    10 years  . GERD (gastroesophageal reflux disease)   . HOH (hard of hearing)   . Hypercholesteremia   . Hypertension    10 years  . Lung cancer ([place])   . Prostate cancer ([place])   . Shortness of breath dyspnea    due to lung mass  . Skin melanoma ([place])   . Sleep apnea    wears CPAP sometimes, cannot tolerate all the time.    Past Surgical History:  Procedure Laterality Date  . bottom lobe of right lung removed  12/17/14  . CARDIAC CATHETERIZATION     no PCI  . CATARACT EXTRACTION Left   . CATARACT EXTRACTION W/PHACO Left 01/03/2016   Procedure: CATARACT EXTRACTION PHACO AND INTRAOCULAR LENS PLACEMENT LEFT EYE CDE=7.78;  Surgeon:  Williams Che, MD;  Location: [facility];  Service: Ophthalmology;  Laterality: Left;  . CATARACT EXTRACTION W/PHACO Right 03/20/2016   Procedure: CATARACT EXTRACTION PHACO AND INTRAOCULAR LENS PLACEMENT; CDE:  9.58;  Surgeon: Williams Che, MD;  Location: [facility];  Service: Ophthalmology;  Laterality: Right;  . COLONOSCOPY  08/2008   Dr. Leafy Half, [place]: normal, internal grade 1 hemorrhoids. next TCS 08/2013  . COLONOSCOPY N/A 03/04/2014   Procedure: COLONOSCOPY;  Surgeon: Daneil Dolin, MD;  Location: [facility];  Service: Endoscopy;  Laterality: N/A;  7:30am  . EYE SURGERY    . knee replacements     bilateral. over 10 years ago  . LOBECTOMY Right 12/17/2014   Procedure: RIGHT LOWER LOBECTOMY;  Surgeon: Melrose Nakayama, MD;  Location: [place];  Service: Thoracic;  Laterality: Right;  . LYMPH NODE DISSECTION Right 12/17/2014   Procedure: LYMPH NODE DISSECTION;  Surgeon: Melrose Nakayama, MD;  Location: [place];  Service: Thoracic;  Laterality: Right;  . NECK SURGERY     melanoma removed.  [name] PROSTATE SURGERY     prostatectomy, radical  . TOTAL HIP ARTHROPLASTY  2007   left  . total shoulder replacement  2011   left  . VIDEO ASSISTED THORACOSCOPY (VATS)/WEDGE RESECTION Right 12/17/2014  Procedure: RIGHT VIDEO ASSISTED THORACOSCOPY (VATS);  Surgeon: Melrose Nakayama, MD;  Location: [place];  Service: Thoracic;  Laterality: Right;  [name] VIDEO BRONCHOSCOPY Bilateral 11/17/2016   Procedure: VIDEO BRONCHOSCOPY WITHOUT FLUORO;  Surgeon: Javier Glazier, MD;  Location: [facility];  Service: Cardiopulmonary;  Laterality: Bilateral;  . VIDEO BRONCHOSCOPY N/A 11/01/2017   Procedure: VIDEO BRONCHOSCOPY;  Surgeon: Melrose Nakayama, MD;  [facility];  Service: Thoracic;  Laterality: N/A;    Family History  Problem Relation Age of Onset  . Cancer Mother        unknown type  . COPD Sister   . Breast cancer Sister   . Cancer Sister 64       breast  . Heart  attack Daughter   . Prostate cancer Son   . Colon cancer Neg Hx     Social History   Socioeconomic History  . Marital status: Divorced    Spouse name: Not on file  . Number of children: 3  . Years of education: associates  . Highest education level: Associate degree: occupational, Hotel manager, or vocational program  Occupational History  . Occupation: retired    Comment: truck Geophysicist/field seismologist  Tobacco Use  . Smoking status: Former Smoker    Packs/day: 2.00    Years: 32.00    Pack years: 64.00    Types: Cigarettes    Quit date: 07/03/1986    Years since quitting: 34.0  . Smokeless tobacco: Never Used  Vaping Use  . Vaping Use: Never used  Substance and Sexual Activity  . Alcohol use: Yes    Alcohol/week: 0.0 standard drinks    Comment: rarely wine  . Drug use: No  . Sexual activity: Yes  Other Topics Concern  . Not on file  Social History Narrative   [employer].  Lives alone.      [facility]:   Originally from [facility]. Served in [employer] with significant exposure to asbestos. 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 He has also lived in [place], [place], & [place]. He has lived in [place] since 1979. 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 No pets currently. Remote bird exposure.       Patient is divorced but has had a girlfriend for 7 months. He enjoys going dancing several times a week. He had 3 children but one passed away at 61 from prostate cancer. He has 17 grandchildren.    Social Determinants of Health   Financial Resource Strain: Not on file  Food Insecurity: Not on file  Transportation Needs: Not on file  Physical Activity: Not on file  Stress: Not on file  Social Connections: Not on file  Intimate Partner Violence: Not on file     Physical Exam   Vitals:   06/26/20 1734 06/26/20 1853  BP: 121/90 126/89  Pulse: (!) 109 (!) 102  Resp: 18 (!) 23  Temp: 98.8 F (37.1 C)   SpO2: 90% 96%    CONSTITUTIONAL: Well-appearing, NAD NEURO:  Alert and oriented x 3, no focal  deficits EYES:  eyes equal and reactive ENT/NECK:  no LAD, no JVD CARDIO: Tachycardic rate, well-perfused, normal S1 and S2 PULM: Crackles left anterior chest, slight tachypnea GI/GU:  normal bowel sounds, non-distended, non-tender MSK/SPINE:  No gross deformities, no edema SKIN:  no rash, atraumatic PSYCH:  Appropriate speech and behavior  *Additional and/or pertinent findings included in MDM below  Diagnostic and Interventional Summary    EKG Interpretation  Date/Time:  Saturday June 26 2020 18:17:50 EST Ventricular Rate:  97 PR Interval:    QRS Duration: 92 QT Interval:  353 QTC Calculation: 449 R Axis:   -41 Text Interpretation: Sinus rhythm Left axis deviation Minimal ST depression, inferior leads Confirmed by Gerlene Fee [phone]) on 06/26/2020 7:45:39 PM      Labs Reviewed  CBC - Abnormal; Notable for the following components:      Result Value   Hemoglobin 9.3 (*)    HCT 32.6 (*)    MCV 74.3 (*)    MCH 21.2 (*)    MCHC 28.5 (*)    RDW 19.9 (*)    All other components within normal limits  BASIC METABOLIC PANEL - Abnormal; Notable for the following components:   Glucose, Bld 233 (*)    Calcium 8.3 (*)    All other components within normal limits  RESP PANEL BY RT-PCR (FLU A&B, COVID) ARPGX2  CULTURE, BLOOD (SINGLE)    DG Chest Port 1 View  Final Result      Medications  azithromycin (ZITHROMAX) 500 mg in sodium chloride 0.9 % 250 mL IVPB (has no administration in time range)  sodium chloride 0.9 % bolus 500 mL (500 mLs Intravenous New Bag/Given 06/26/20 1944)  cefTRIAXone (ROCEPHIN) 2 g in sodium chloride 0.9 % 100 mL IVPB (2 g Intravenous New Bag/Given 06/26/20 1852)  acetaminophen (TYLENOL) tablet 1,000 mg (1,000 mg Oral Given 06/26/20 1818)     Procedures  /  Critical Care Procedures  ED Course and Medical Decision Making  I have reviewed the triage vital signs, the nursing notes, and pertinent available records from the EMR.  Listed above  are laboratory and imaging tests that I personally ordered, reviewed, and interpreted and then considered in my medical decision making (see below for details).  Suspect community-acquired pneumonia in this 79 year old male with multiple comorbidities.  I was able to ambulate patient with pulse ox and he desaturated to 87%.  Will need admission, awaiting labs, chest x-ray     X-ray with evidence of pneumonia, admitted to hospital service for further care.  Barth Kirks. Sedonia Small, MD [place] [email or web address]  Final Clinical Impressions(s) / ED Diagnoses     ICD-10-CM   1. Community acquired pneumonia of left lung, unspecified part of lung  J18.9     ED Discharge Orders    None       Discharge Instructions Discussed with and Provided to Patient:   Discharge Instructions   None       Maudie Flakes, MD 06/26/20 1946

## 2020-06-27 DIAGNOSIS — U071 COVID-19: Secondary | ICD-10-CM | POA: Diagnosis not present

## 2020-06-27 DIAGNOSIS — J1282 Pneumonia due to coronavirus disease 2019: Secondary | ICD-10-CM | POA: Diagnosis not present

## 2020-06-27 LAB — CBC WITH DIFFERENTIAL/PLATELET
Abs Immature Granulocytes: 0.03 10*3/uL (ref 0.00–0.07)
Basophils Absolute: 0 10*3/uL (ref 0.0–0.1)
Basophils Relative: 0 %
Eosinophils Absolute: 0 10*3/uL (ref 0.0–0.5)
Eosinophils Relative: 0 %
HCT: 32.5 % — ABNORMAL LOW (ref 39.0–52.0)
Hemoglobin: 9 g/dL — ABNORMAL LOW (ref 13.0–17.0)
Immature Granulocytes: 1 %
Lymphocytes Relative: 6 %
Lymphs Abs: 0.4 10*3/uL — ABNORMAL LOW (ref 0.7–4.0)
MCH: 20.8 pg — ABNORMAL LOW (ref 26.0–34.0)
MCHC: 27.7 g/dL — ABNORMAL LOW (ref 30.0–36.0)
MCV: 75.1 fL — ABNORMAL LOW (ref 80.0–100.0)
Monocytes Absolute: 0.2 10*3/uL (ref 0.1–1.0)
Monocytes Relative: 2 %
Neutro Abs: 5.6 10*3/uL (ref 1.7–7.7)
Neutrophils Relative %: 91 %
Platelets: 217 10*3/uL (ref 150–400)
RBC: 4.33 MIL/uL (ref 4.22–5.81)
RDW: 20 % — ABNORMAL HIGH (ref 11.5–15.5)
WBC: 6.2 10*3/uL (ref 4.0–10.5)
nRBC: 0 % (ref 0.0–0.2)

## 2020-06-27 LAB — COMPREHENSIVE METABOLIC PANEL
ALT: 13 U/L (ref 0–44)
AST: 21 U/L (ref 15–41)
Albumin: 2.9 g/dL — ABNORMAL LOW (ref 3.5–5.0)
Alkaline Phosphatase: 41 U/L (ref 38–126)
Anion gap: 9 (ref 5–15)
BUN: 16 mg/dL (ref 8–23)
CO2: 25 mmol/L (ref 22–32)
Calcium: 8.1 mg/dL — ABNORMAL LOW (ref 8.9–10.3)
Chloride: 103 mmol/L (ref 98–111)
Creatinine, Ser: 0.9 mg/dL (ref 0.61–1.24)
GFR, Estimated: >60 mL/min (ref >60)
Glucose, Bld: 297 mg/dL — ABNORMAL HIGH (ref 70–99)
Potassium: 4.4 mmol/L (ref 3.5–5.1)
Sodium: 137 mmol/L (ref 135–145)
Total Bilirubin: 0.5 mg/dL (ref 0.3–1.2)
Total Protein: 6.3 g/dL — ABNORMAL LOW (ref 6.5–8.1)

## 2020-06-27 LAB — PHOSPHORUS: Phosphorus: 2.8 mg/dL (ref 2.5–4.6)

## 2020-06-27 LAB — D-DIMER, QUANTITATIVE: D-Dimer, Quant: 0.61 ug/mL-FEU — ABNORMAL HIGH (ref 0.00–0.50)

## 2020-06-27 LAB — MAGNESIUM: Magnesium: 1.9 mg/dL (ref 1.7–2.4)

## 2020-06-27 LAB — CBG MONITORING, ED
Glucose-Capillary: 236 mg/dL — ABNORMAL HIGH (ref 70–99)
Glucose-Capillary: 278 mg/dL — ABNORMAL HIGH (ref 70–99)
Glucose-Capillary: 259 mg/dL — ABNORMAL HIGH (ref 70–99)

## 2020-06-27 LAB — FERRITIN: Ferritin: 35 ng/mL (ref 24–336)

## 2020-06-27 LAB — C-REACTIVE PROTEIN: CRP: 11.4 mg/dL — ABNORMAL HIGH (ref <1.0)

## 2020-06-27 MED ORDER — K PHOS MONO-SOD PHOS DI & MONO 155-852-130 MG PO TABS
500.0000 mg | ORAL_TABLET | Freq: Three times a day (TID) | ORAL | Status: DC
  Administered 2020-06-27: 500 mg via ORAL
  Filled 2020-06-27 (×3): qty 2

## 2020-06-27 MED ORDER — GUAIFENESIN-DM 100-10 MG/5ML PO SYRP: 10.0000 mL | ORAL_SOLUTION | ORAL | 0 refills | Status: DC | PRN

## 2020-06-27 MED ORDER — PREDNISONE 20 MG PO TABS: 20.0000 mg | ORAL_TABLET | Freq: Every day | ORAL | 0 refills | Status: AC

## 2020-06-27 NOTE — ED Notes (Signed)
94% upon ambulating and moving in room on Room [email or web address]

## 2020-06-27 NOTE — ED Notes (Signed)
Pt unhappy breakfast tray is not here at this time.  Dietary called earlier will follow up.

## 2020-06-27 NOTE — Discharge Instructions (Signed)
COVID-19 COVID-19 is a respiratory infection that is caused by a virus called severe acute respiratory syndrome coronavirus 2 (SARS-CoV-2). The disease is also known as coronavirus disease or novel coronavirus. In some people, the virus may not cause any symptoms. In others, it may cause a serious infection. The infection can get worse quickly and can lead to complications, such as:  Pneumonia, or infection of the lungs.  Acute respiratory distress syndrome or ARDS. This is a condition in which fluid build-up in the lungs prevents the lungs from filling with air and passing oxygen into the blood.  Acute respiratory failure. This is a condition in which there is not enough oxygen passing from the lungs to the body or when carbon dioxide is not passing from the lungs out of the body.  Sepsis or septic shock. This is a serious bodily reaction to an infection.  Blood clotting problems.  Secondary infections due to bacteria or fungus.  Organ failure. This is when your body's organs stop working. The virus that causes COVID-19 is contagious. This means that it can spread from person to person through droplets from coughs and sneezes (respiratory secretions). What are the causes? This illness is caused by a virus. You may catch the virus by:  Breathing in droplets from an infected person. Droplets can be spread by a person breathing, speaking, singing, coughing, or sneezing.  Touching something, like a table or a doorknob, that was exposed to the virus (contaminated) and then touching your mouth, nose, or eyes. What increases the risk? Risk for infection You are more likely to be infected with this virus if you:  Are within 6 feet (2 meters) of a person with COVID-19.  Provide care for or live with a person who is infected with COVID-19.  Spend time in crowded indoor spaces or live in shared housing. Risk for serious illness You are more likely to become seriously ill from the virus if  you:  Are 75 years of age or older. The higher your age, the more you are at risk for serious illness.  Live in a nursing home or long-term care facility.  Have cancer.  Have a long-term (chronic) disease such as: ? Chronic lung disease, including chronic obstructive pulmonary disease or asthma. ? A long-term disease that lowers your body's ability to fight infection (immunocompromised). ? Heart disease, including heart failure, a condition in which the arteries that lead to the heart become narrow or blocked (coronary artery disease), a disease which makes the heart muscle thick, weak, or stiff (cardiomyopathy). ? Diabetes. ? Chronic kidney disease. ? Sickle cell disease, a condition in which red blood cells have an abnormal "sickle" shape. ? Liver disease.  Are obese. What are the signs or symptoms? Symptoms of this condition can range from mild to severe. Symptoms may appear any time from 2 to 14 days after being exposed to the virus. They include:  A fever or chills.  A cough.  Difficulty breathing.  Headaches, body aches, or muscle aches.  Runny or stuffy (congested) nose.  A sore throat.  New loss of taste or smell. Some people may also have stomach problems, such as nausea, vomiting, or diarrhea. Other people may not have any symptoms of COVID-19. How is this diagnosed? This condition may be diagnosed based on:  Your signs and symptoms, especially if: ? You live in an area with a COVID-19 outbreak. ? You recently traveled to or from an area where the virus is common. ? You  provide care for or live with a person who was diagnosed with COVID-19. ? You were exposed to a person who was diagnosed with COVID-19.  A physical exam.  Lab tests, which may include: ? Taking a sample of fluid from the back of your nose and throat (nasopharyngeal fluid), your nose, or your throat using a swab. ? A sample of mucus from your lungs (sputum). ? Blood tests.  Imaging tests,  which may include, X-rays, CT scan, or ultrasound. How is this treated? At present, there is no medicine to treat COVID-19. Medicines that treat other diseases are being used on a trial basis to see if they are effective against COVID-19. Your health care provider will talk with you about ways to treat your symptoms. For most people, the infection is mild and can be managed at home with rest, fluids, and over-the-counter medicines. Treatment for a serious infection usually takes places in a hospital intensive care unit (ICU). It may include one or more of the following treatments. These treatments are given until your symptoms improve.  Receiving fluids and medicines through an IV.  Supplemental oxygen. Extra oxygen is given through a tube in the nose, a face mask, or a hood.  Positioning you to lie on your stomach (prone position). This makes it easier for oxygen to get into the lungs.  Continuous positive airway pressure (CPAP) or bi-level positive airway pressure (BPAP) machine. This treatment uses mild air pressure to keep the airways open. A tube that is connected to a motor delivers oxygen to the body.  Ventilator. This treatment moves air into and out of the lungs by using a tube that is placed in your windpipe.  Tracheostomy. This is a procedure to create a hole in the neck so that a breathing tube can be inserted.  Extracorporeal membrane oxygenation (ECMO). This procedure gives the lungs a chance to recover by taking over the functions of the heart and lungs. It supplies oxygen to the body and removes carbon dioxide. Follow these instructions at home: Lifestyle  If you are sick, stay home except to get medical care. Your health care provider will tell you how long to stay home. Call your health care provider before you go for medical care.  Rest at home as told by your health care provider.  Do not use any products that contain nicotine or tobacco, such as cigarettes,  e-cigarettes, and chewing tobacco. If you need help quitting, ask your health care provider.  Return to your normal activities as told by your health care provider. Ask your health care provider what activities are safe for you. General instructions  Take over-the-counter and prescription medicines only as told by your health care provider.  Drink enough fluid to keep your urine pale yellow.  Keep all follow-up visits as told by your health care provider. This is important. How is this prevented?  There is no vaccine to help prevent COVID-19 infection. However, there are steps you can take to protect yourself and others from this virus. To protect yourself:   Do not travel to areas where COVID-19 is a risk. The areas where COVID-19 is reported change often. To identify high-risk areas and travel restrictions, check the CDC travel website: FatFares.com.br  If you live in, or must travel to, an area where COVID-19 is a risk, take precautions to avoid infection. ? Stay away from people who are sick. ? Wash your hands often with soap and water for 20 seconds. If soap and water  are not available, use an alcohol-based hand sanitizer. ? Avoid touching your mouth, face, eyes, or nose. ? Avoid going out in public, follow guidance from your state and local health authorities. ? If you must go out in public, wear a cloth face covering or face mask. Make sure your mask covers your nose and mouth. ? Avoid crowded indoor spaces. Stay at least 6 feet (2 meters) away from others. ? Disinfect objects and surfaces that are frequently touched every day. This may include:  Counters and tables.  Doorknobs and light switches.  Sinks and faucets.  Electronics, such as phones, remote controls, keyboards, computers, and tablets. To protect others: If you have symptoms of COVID-19, take steps to prevent the virus from spreading to others.  If you think you have a COVID-19 infection, contact  your health care provider right away. Tell your health care team that you think you may have a COVID-19 infection.  Stay home. Leave your house only to seek medical care. Do not use public transport.  Do not travel while you are sick.  Wash your hands often with soap and water for 20 seconds. If soap and water are not available, use alcohol-based hand sanitizer.  Stay away from other members of your household. Let healthy household members care for children and pets, if possible. If you have to care for children or pets, wash your hands often and wear a mask. If possible, stay in your own room, separate from others. Use a different bathroom.  Make sure that all people in your household wash their hands well and often.  Cough or sneeze into a tissue or your sleeve or elbow. Do not cough or sneeze into your hand or into the air.  Wear a cloth face covering or face mask. Make sure your mask covers your nose and mouth. Where to find more information  Centers for Disease Control and Prevention: PurpleGadgets.be  World Health Organization: https://www.castaneda.info/ Contact a health care provider if:  You live in or have traveled to an area where COVID-19 is a risk and you have symptoms of the infection.  You have had contact with someone who has COVID-19 and you have symptoms of the infection. Get help right away if:  You have trouble breathing.  You have pain or pressure in your chest.  You have confusion.  You have bluish lips and fingernails.  You have difficulty waking from sleep.  You have symptoms that get worse. These symptoms may represent a serious problem that is an emergency. Do not wait to see if the symptoms will go away. Get medical help right away. Call your local emergency services (911 in the [place] Do not drive yourself to the hospital. Let the emergency medical personnel know if you think you have  COVID-19. Summary  COVID-19 is a respiratory infection that is caused by a virus. It is also known as coronavirus disease or novel coronavirus. It can cause serious infections, such as pneumonia, acute respiratory distress syndrome, acute respiratory failure, or sepsis.  The virus that causes COVID-19 is contagious. This means that it can spread from person to person through droplets from breathing, speaking, singing, coughing, or sneezing.  You are more likely to develop a serious illness if you are 79 years of age or older, have a weak immune system, live in a nursing home, or have chronic disease.  There is no medicine to treat COVID-19. Your health care provider will talk with you about ways to treat your symptoms.  Take steps to protect yourself and others from infection. Wash your hands often and disinfect objects and surfaces that are frequently touched every day. Stay away from people who are sick and wear a mask if you are sick. This information is not intended to replace advice given to you by your health care provider. Make sure you discuss any questions you have with your health care provider. Document Revised: 04/18/2019 Document Reviewed: 07/25/2018 Elsevier Patient Education  [place] Pneumonia, Adult Pneumonia is an infection of the lungs. It causes swelling in the airways of the lungs. Mucus and fluid may also build up inside the airways. One type of pneumonia can happen while a person is in a hospital. A different type can happen when a person is not in a hospital (community-acquired pneumonia).  What are the causes?  This condition is caused by germs (viruses, bacteria, or fungi). Some types of germs can be passed from one person to another. This can happen when you breathe in droplets from the cough or sneeze of an infected person. What increases the risk? You are more likely to develop this condition if you:  Have a long-term (chronic)  disease, such as: ? Chronic obstructive pulmonary disease (COPD). ? Asthma. ? Cystic fibrosis. ? Congestive heart failure. ? Diabetes. ? Kidney disease.  Have HIV.  Have sickle cell disease.  Have had your spleen removed.  Do not take good care of your teeth and mouth (poor dental hygiene).  Have a medical condition that increases the risk of breathing in droplets from your own mouth and nose.  Have a weakened body defense system (immune system).  Are a smoker.  Travel to areas where the germs that cause this illness are common.  Are around certain animals or the places they live. What are the signs or symptoms?  A dry cough.  A wet (productive) cough.  Fever.  Sweating.  Chest pain. This often happens when breathing deeply or coughing.  Fast breathing or trouble breathing.  Shortness of breath.  Shaking chills.  Feeling tired (fatigue).  Muscle aches. How is this treated? Treatment for this condition depends on many things. Most adults can be treated at home. In some cases, treatment must happen in a hospital. Treatment may include:  Medicines given by mouth or through an IV tube.  Being given extra oxygen.  Respiratory therapy. In rare cases, treatment for very bad pneumonia may include:  Using a machine to help you breathe.  Having a procedure to remove fluid from around your lungs. Follow these instructions at home: Medicines  Take over-the-counter and prescription medicines only as told by your doctor. ? Only take cough medicine if you are losing sleep.  If you were prescribed an antibiotic medicine, take it as told by your doctor. Do not stop taking the antibiotic even if you start to feel better. General instructions   Sleep with your head and neck raised (elevated). You can do this by sleeping in a recliner or by putting a few pillows under your head.  Rest as needed. Get at least 8 hours of sleep each night.  Drink enough water to keep  your pee (urine) pale yellow.  Eat a healthy diet that includes plenty of vegetables, fruits, whole grains, low-fat dairy products, and lean protein.  Do not use any products that contain nicotine or tobacco. These include cigarettes, e-cigarettes, and chewing tobacco. If you need help quitting, ask your doctor.  Keep all follow-up visits as told  by your doctor. This is important. How is this prevented? A shot (vaccine) can help prevent pneumonia. Shots are often suggested for:  People older than 79 years of age.  People older than 79 years of age who: ? Are having cancer treatment. ? Have long-term (chronic) lung disease. ? Have problems with their body's defense system. You may also prevent pneumonia if you take these actions:  Get the flu (influenza) shot every year.  Go to the dentist as often as told.  Wash your hands often. If you cannot use soap and water, use hand sanitizer. Contact a doctor if:  You have a fever.  You lose sleep because your cough medicine does not help. Get help right away if:  You are short of breath and it gets worse.  You have more chest pain.  Your sickness gets worse. This is very serious if: ? You are an older adult. ? Your body's defense system is weak.  You cough up blood. Summary  Pneumonia is an infection of the lungs.  Most adults can be treated at home. Some will need treatment in a hospital.  Drink enough water to keep your pee pale yellow.  Get at least 8 hours of sleep each night. This information is not intended to replace advice given to you by your health care provider. Make sure you discuss any questions you have with your health care provider. Document Revised: 10/09/2018 Document Reviewed: 02/14/2018 Elsevier Patient Education  [place].

## 2020-06-27 NOTE — Discharge Summary (Signed)
Physician Discharge Summary  Timothy Guerrero NAT:[id number] DOB: [birth year] DOA: 06/26/2020  PCP: Dettinger, Fransisca Kaufmann, MD  Admit date: 06/26/2020  Discharge date: 06/27/2020  Admitted From:Home  Disposition:  Home  Recommendations for Outpatient Follow-up:  1. Follow up with PCP in 1-2 weeks 2. Please obtain BMP/CBC in one week 3. Continue on prednisone as prescribed for several more days 4. Use home breathing treatments as needed for shortness of breath or wheezing 5. Patient has refused use of remdesivir during his brief inpatient stay  Home Health: None  Equipment/Devices: None  Discharge Condition:Stable  CODE STATUS: Full  Diet recommendation: Heart Healthy/carb modified  Brief/Interim Summary: Per HPI: Timothy Guerrero is a 79 y.o. male with medical history significant of microcytic anemia, osteoarthritis, paroxysmal atrial fibrillation/atrial flutter, benign essential tremor, history of blood transfusion, cataract, chronic diastolic CHF, COPD, type 2 diabetes, hypercholesterolemia, hypertension, lung cancer with history of RLL lobectomy complicated by postop atrial flutter, history of prostate cancer, skin melanoma, sleep apnea not on CPAP who is coming to the emergency department due to fever for a week associated with 3-4 episodes of loose stools daily, decreased appetite, fatigue, malaise, hypersomnia (sleeping 10 to 12 hours a day) and occasionally productive cough.  He denies headache, rhinorrhea, sore throat, wheezing or hemoptysis.  No chest pain, palpitations, diaphoresis, PND, orthopnea or pitting edema of the lower extremities.  He has felt mildly lightheaded at times.  He denies abdominal pain, emesis, constipation, melena or hematochezia.  No dysuria, frequency or hematuria.  Denies polyuria, polydipsia, polyphagia or blurred vision.  -Patient was admitted with acute hypoxemic respiratory failure secondary to COVID-19 pneumonia and was started on steroids and  remdesivir, however he declined remdesivir treatment.  He has improved with the use of steroids and other symptomatic treatments and is now on room air.  He is eager for discharge and refuses any further treatments aside from steroids at this time.  No other acute events noted throughout the course of this admission.  He is stable for discharge.  Discharge Diagnoses:  Principal Problem:   Pneumonia due to COVID-19 virus Active Problems:   Type 2 diabetes mellitus ([place])   Essential hypertension   Sleep apnea   Hypothyroidism   Depression, recurrent (HCC)   PAF (paroxysmal atrial fibrillation) (HCC)   Hypophosphatemia   COPD (chronic obstructive pulmonary disease) (HCC)   Microcytic anemia   GERD (gastroesophageal reflux disease)  Principal discharge diagnosis: Acute hypoxemic respiratory failure secondary to COVID-19 pneumonia-refused remdesivir treatment.  Discharge Instructions  Discharge Instructions    Diet - low sodium heart healthy   Complete by: As directed    Increase activity slowly   Complete by: As directed      Allergies as of 06/27/2020   No Known Allergies     Medication List    TAKE these medications   acetaminophen 325 MG tablet Commonly known as: TYLENOL Take 2 tablets (650 mg total) by mouth every 6 (six) hours as needed for mild pain, fever or headache (or Fever >/= 101).   albuterol 108 (90 Base) MCG/ACT inhaler Commonly known as: VENTOLIN HFA Inhale 1 puff into the lungs every 6 (six) hours as needed for wheezing or shortness of breath.   amLODipine 10 MG tablet Commonly known as: NORVASC TAKE 1 TABLET BY MOUTH EVERY DAY   ASCORBIC ACID PO Take 1 tablet by mouth daily.   azithromycin 250 MG tablet Commonly known as: ZITHROMAX Take 500 mg once, then 250 mg for  four days   Breo Ellipta 200-25 MCG/INH Aepb Generic drug: fluticasone furoate-vilanterol Inhale 1 puff into the lungs daily.   cholecalciferol 25 MCG (1000 UNIT) tablet Commonly  known as: VITAMIN D3 Take 1,000 Units by mouth daily.   ezetimibe 10 MG tablet Commonly known as: ZETIA Take 1 tablet (10 mg total) by mouth daily.   furosemide 20 MG tablet Commonly known as: LASIX Take 1 tablet (20 mg total) by mouth daily.   gabapentin 300 MG capsule Commonly known as: NEURONTIN Take 300 mg by mouth 3 (three) times daily.   guaiFENesin-dextromethorphan 100-10 MG/5ML syrup Commonly known as: ROBITUSSIN DM Take 10 mLs by mouth every 4 (four) hours as needed for cough.   insulin aspart protamine- aspart (70-30) 100 UNIT/ML injection Commonly known as: NOVOLOG MIX 70/30 Inject 50 Units into the skin 2 (two) times daily.   levothyroxine 50 MCG tablet Commonly known as: SYNTHROID Take 1 tablet (50 mcg total) by mouth daily before breakfast. ((for thyroid))   losartan 50 MG tablet Commonly known as: COZAAR Take 25 mg by mouth daily.   magnesium oxide 400 MG tablet Commonly known as: MAG-OX Take 400 mg by mouth 2 (two) times daily.   metFORMIN 1000 MG tablet Commonly known as: GLUCOPHAGE Take 1 tablet (1,000 mg total) by mouth 2 (two) times daily with a meal. Needs to be seen before next refill.   metoprolol 200 MG 24 hr tablet Commonly known as: TOPROL-XL Take 1 tablet (200 mg total) by mouth daily.   mupirocin ointment 2 % Commonly known as: BACTROBAN APPLY TO AFFECTED AREA EVERY DAY   Ozempic (1 MG/DOSE) 2 MG/1.5ML Sopn Generic drug: Semaglutide (1 MG/DOSE) Inject 1 mg into the skin once a week.   pantoprazole 40 MG tablet Commonly known as: PROTONIX Take 1 tablet (40 mg total) by mouth daily.   Potassium Chloride ER 20 MEQ Tbcr Take 20 mEq by mouth daily. Take only on days when taking lasix   predniSONE 20 MG tablet Commonly known as: Deltasone Take 1 tablet (20 mg total) by mouth daily for 5 days.   rosuvastatin 40 MG tablet Commonly known as: CRESTOR Take 1 tablet (40 mg total) by mouth daily.   STIOLTO RESPIMAT IN Inhale 1 puff  into the lungs daily as needed (shortness of breath).   triamcinolone 0.1 % Commonly known as: KENALOG Apply 1 application topically 2 (two) times daily.   valACYclovir 1000 MG tablet Commonly known as: VALTREX Take 1,000 mg by mouth daily as needed (cold sores).   VITAMIN B-12 PO Take 1 tablet by mouth daily.       Follow-up Information    Dettinger, Fransisca Kaufmann, MD Follow up in 2 week(s).   Specialties: Family Medicine, Cardiology Contact information: [place] [address] [phone]              No Known Allergies  Consultations:  None   Procedures/Studies: DG Chest Port 1 View  Result Date: 06/26/2020 CLINICAL DATA:  Pain. EXAM: PORTABLE CHEST 1 VIEW COMPARISON:  03/09/2019 FINDINGS: There is an airspace opacity in the left mid lung zone, new since prior study. The heart size is stable. Aortic calcifications are noted. There is no pneumothorax. Chronic interstitial lung markings are noted. There is a trace right-sided pleural effusion. There are advanced degenerative changes of both glenohumeral joints. Postsurgical changes related to right lower lobe lobectomy. Emphysematous changes are noted IMPRESSION: 1. Findings consistent with left mid lung zone pneumonia (viral or bacterial).  2. There are postsurgical changes related prior right lower lobe lobectomy. 3. Aortic Atherosclerosis (ICD10-I70.0) and Emphysema (ICD10-J43.9). Electronically Signed   By: Constance Holster M.D.   On: 06/26/2020 18:12      Discharge Exam: Vitals:   06/27/20 1330 06/27/20 1401  BP: 101/75   Pulse: 78   Resp: (!) 24   Temp:    SpO2: 92% 92%   Vitals:   06/27/20 1230 06/27/20 1300 06/27/20 1330 06/27/20 1401  BP: 117/68 117/75 101/75   Pulse: 76 79 78   Resp: (!) 25 (!) 22 (!) 24   Temp:      TempSrc:      SpO2: 98% 97% 92% 92%  Weight:      Height:        General: Pt is alert, awake, not in acute distress Cardiovascular: RRR, S1/S2 +, no rubs, no  gallops Respiratory: CTA bilaterally, no wheezing, no rhonchi Abdominal: Soft, NT, ND, bowel sounds + Extremities: no edema, no cyanosis    The results of significant diagnostics from this hospitalization (including imaging, microbiology, ancillary and laboratory) are listed below for reference.     Microbiology: Recent Results (from the past 240 hour(s))  Resp Panel by RT-PCR (Flu A&B, Covid) Nasopharyngeal Swab     Status: Abnormal   Collection Time: 06/26/20  6:21 PM   Specimen: Nasopharyngeal Swab; Nasopharyngeal(NP) swabs in vial transport medium  Result Value Ref Range Status   SARS Coronavirus 2 by RT PCR POSITIVE (A) NEGATIVE Final    Comment: RESULT CALLED TO, READ BACK BY AND VERIFIED WITH: LURZ,G @ 2042 ON 12/35/21 BY JUW (NOTE) SARS-CoV-2 target nucleic acids are DETECTED.  The SARS-CoV-2 RNA is generally detectable in upper respiratory specimens during the acute phase of infection. Positive results are indicative of the presence of the identified virus, but do not rule out bacterial infection or co-infection with other pathogens not detected by the test. Clinical correlation with patient history and other diagnostic information is necessary to determine patient infection status. The expected result is Negative.  Fact Sheet for Patients: EntrepreneurPulse.com.au  Fact Sheet for Healthcare Providers: IncredibleEmployment.be  This test is not yet approved or cleared by the [place] FDA and  has been authorized for detection and/or diagnosis of SARS-CoV-2 by FDA under an Emergency Use Authorization (EUA).  This EUA will remain in effect (meaning this test can be  used) for the duration of  the COVID-19 declaration under Section 564(b)(1) of the Act, 21 U.S.C. section 360bbb-3(b)(1), unless the authorization is terminated or revoked sooner.     Influenza A by PCR NEGATIVE NEGATIVE Final   Influenza B by PCR NEGATIVE  NEGATIVE Final    Comment: (NOTE) The Xpert Xpress SARS-CoV-2/FLU/RSV plus assay is intended as an aid in the diagnosis of influenza from Nasopharyngeal swab specimens and should not be used as a sole basis for treatment. Nasal washings and aspirates are unacceptable for Xpert Xpress SARS-CoV-2/FLU/RSV testing.  Fact Sheet for Patients: EntrepreneurPulse.com.au  Fact Sheet for Healthcare Providers: IncredibleEmployment.be  This test is not yet approved or cleared by the [place] FDA and has been authorized for detection and/or diagnosis of SARS-CoV-2 by FDA under an Emergency Use Authorization (EUA). This EUA will remain in effect (meaning this test can be used) for the duration of the COVID-19 declaration under Section 564(b)(1) of the Act, 21 U.S.C. section 360bbb-3(b)(1), unless the authorization is terminated or revoked.  Performed at [facility], [address] [place], [place] [address]  Labs: BNP (last 3 results) Recent Labs    06/26/20 1844  BNP 27.0   Basic Metabolic Panel: Recent Labs  Lab 06/26/20 1844 06/27/20 0617  NA 135 137  K 3.8 4.4  CL 102 103  CO2 24 25  GLUCOSE 233* 297*  BUN 16 16  CREATININE 0.85 0.90  CALCIUM 8.3* 8.1*  MG 1.8 1.9  PHOS 2.2* 2.8   Liver Function Tests: Recent Labs  Lab 06/26/20 2157 06/27/20 0617  AST 21 21  ALT 13 13  ALKPHOS 38 41  BILITOT 0.5 0.5  PROT 6.4* 6.3*  ALBUMIN 3.1* 2.9*   No results for input(s): LIPASE, AMYLASE in the last 168 hours. No results for input(s): AMMONIA in the last 168 hours. CBC: Recent Labs  Lab 06/26/20 1844 06/27/20 0617  WBC 7.2 6.2  NEUTROABS  --  5.6  HGB 9.3* 9.0*  HCT 32.6* 32.5*  MCV 74.3* 75.1*  PLT 202 217   Cardiac Enzymes: No results for input(s): CKTOTAL, CKMB, CKMBINDEX, TROPONINI in the last 168 hours. BNP: Invalid input(s): POCBNP CBG: Recent Labs  Lab 06/26/20 2308 06/27/20 0446 06/27/20 0746  06/27/20 1213  GLUCAP 183* 236* 278* 259*   D-Dimer Recent Labs    06/26/20 1844 06/27/20 0617  DDIMER 0.62* 0.61*   Hgb A1c No results for input(s): HGBA1C in the last 72 hours. Lipid Profile No results for input(s): CHOL, HDL, LDLCALC, TRIG, CHOLHDL, LDLDIRECT in the last 72 hours. Thyroid function studies No results for input(s): TSH, T4TOTAL, T3FREE, THYROIDAB in the last 72 hours.  Invalid input(s): FREET3 Anemia work up Recent Labs    06/26/20 1844 06/27/20 0617  FERRITIN 33 35   Urinalysis    Component Value Date/Time   COLORURINE YELLOW 03/10/2019 0030   APPEARANCEUR CLEAR 03/10/2019 0030   APPEARANCEUR Clear 03/07/2019 1643   LABSPEC 1.027 03/10/2019 0030   PHURINE 5.0 03/10/2019 0030   GLUCOSEU >=500 (A) 03/10/2019 0030   HGBUR NEGATIVE 03/10/2019 0030   BILIRUBINUR NEGATIVE 03/10/2019 0030   BILIRUBINUR Negative 03/07/2019 1643   KETONESUR NEGATIVE 03/10/2019 0030   PROTEINUR 100 (A) 03/10/2019 0030   UROBILINOGEN 0.2 12/15/2014 1111   NITRITE NEGATIVE 03/10/2019 0030   LEUKOCYTESUR NEGATIVE 03/10/2019 0030   Sepsis Labs Invalid input(s): PROCALCITONIN,  WBC,  LACTICIDVEN Microbiology Recent Results (from the past 240 hour(s))  Resp Panel by RT-PCR (Flu A&B, Covid) Nasopharyngeal Swab     Status: Abnormal   Collection Time: 06/26/20  6:21 PM   Specimen: Nasopharyngeal Swab; Nasopharyngeal(NP) swabs in vial transport medium  Result Value Ref Range Status   SARS Coronavirus 2 by RT PCR POSITIVE (A) NEGATIVE Final    Comment: RESULT CALLED TO, READ BACK BY AND VERIFIED WITH: LURZ,G @ 2042 ON 12/35/21 BY JUW (NOTE) SARS-CoV-2 target nucleic acids are DETECTED.  The SARS-CoV-2 RNA is generally detectable in upper respiratory specimens during the acute phase of infection. Positive results are indicative of the presence of the identified virus, but do not rule out bacterial infection or co-infection with other pathogens not detected by the test.  Clinical correlation with patient history and other diagnostic information is necessary to determine patient infection status. The expected result is Negative.  Fact Sheet for Patients: EntrepreneurPulse.com.au  Fact Sheet for Healthcare Providers: IncredibleEmployment.be  This test is not yet approved or cleared by the [place] FDA and  has been authorized for detection and/or diagnosis of SARS-CoV-2 by FDA under an Emergency Use Authorization (EUA).  This EUA will remain in  effect (meaning this test can be  used) for the duration of  the COVID-19 declaration under Section 564(b)(1) of the Act, 21 U.S.C. section 360bbb-3(b)(1), unless the authorization is terminated or revoked sooner.     Influenza A by PCR NEGATIVE NEGATIVE Final   Influenza B by PCR NEGATIVE NEGATIVE Final    Comment: (NOTE) The Xpert Xpress SARS-CoV-2/FLU/RSV plus assay is intended as an aid in the diagnosis of influenza from Nasopharyngeal swab specimens and should not be used as a sole basis for treatment. Nasal washings and aspirates are unacceptable for Xpert Xpress SARS-CoV-2/FLU/RSV testing.  Fact Sheet for Patients: EntrepreneurPulse.com.au  Fact Sheet for Healthcare Providers: IncredibleEmployment.be  This test is not yet approved or cleared by the [place] FDA and has been authorized for detection and/or diagnosis of SARS-CoV-2 by FDA under an Emergency Use Authorization (EUA). This EUA will remain in effect (meaning this test can be used) for the duration of the COVID-19 declaration under Section 564(b)(1) of the Act, 21 U.S.C. section 360bbb-3(b)(1), unless the authorization is terminated or revoked.  Performed at [facility], [address], [place], [place] [address]      Time coordinating discharge: 35 minutes  SIGNED:   Rodena Goldmann, DO [facility] 06/27/2020, 2:13 PM  If 7PM-7AM, please  contact night-coverage [email or web address]

## 2020-06-27 NOTE — Clinical Social Work Note (Signed)
[place] notification done [phone]

## 2020-06-27 NOTE — ED Notes (Signed)
Awaiting on pts ride.

## 2020-06-28 ENCOUNTER — Encounter: Payer: Self-pay | Admitting: *Deleted

## 2020-06-28 ENCOUNTER — Telehealth: Payer: Self-pay

## 2020-06-28 DIAGNOSIS — Z20822 Contact with and (suspected) exposure to covid-19: Secondary | ICD-10-CM | POA: Diagnosis not present

## 2020-06-28 NOTE — Telephone Encounter (Signed)
Opened in error

## 2020-06-28 NOTE — Telephone Encounter (Signed)
Appointment scheduled.

## 2020-07-01 ENCOUNTER — Telehealth: Payer: Self-pay | Admitting: *Deleted

## 2020-07-01 ENCOUNTER — Encounter (HOSPITAL_COMMUNITY): Payer: Self-pay | Admitting: *Deleted

## 2020-07-01 ENCOUNTER — Emergency Department (HOSPITAL_COMMUNITY)
Admission: EM | Admit: 2020-07-01 | Discharge: 2020-07-02 | Disposition: A | Discharge status: Home / Self Care | Payer: No Typology Code available for payment source | Attending: Emergency Medicine | Admitting: Emergency Medicine

## 2020-07-01 DIAGNOSIS — Z85828 Personal history of other malignant neoplasm of skin: Secondary | ICD-10-CM | POA: Diagnosis not present

## 2020-07-01 DIAGNOSIS — E119 Type 2 diabetes mellitus without complications: Secondary | ICD-10-CM | POA: Diagnosis not present

## 2020-07-01 DIAGNOSIS — U071 COVID-19: Secondary | ICD-10-CM

## 2020-07-01 DIAGNOSIS — E039 Hypothyroidism, unspecified: Secondary | ICD-10-CM | POA: Insufficient documentation

## 2020-07-01 DIAGNOSIS — I5022 Chronic systolic (congestive) heart failure: Secondary | ICD-10-CM | POA: Insufficient documentation

## 2020-07-01 DIAGNOSIS — J449 Chronic obstructive pulmonary disease, unspecified: Secondary | ICD-10-CM | POA: Diagnosis not present

## 2020-07-01 DIAGNOSIS — I25110 Atherosclerotic heart disease of native coronary artery with unstable angina pectoris: Secondary | ICD-10-CM | POA: Diagnosis not present

## 2020-07-01 DIAGNOSIS — Z85118 Personal history of other malignant neoplasm of bronchus and lung: Secondary | ICD-10-CM | POA: Insufficient documentation

## 2020-07-01 DIAGNOSIS — Z8546 Personal history of malignant neoplasm of prostate: Secondary | ICD-10-CM | POA: Insufficient documentation

## 2020-07-01 DIAGNOSIS — I110 Hypertensive heart disease with heart failure: Secondary | ICD-10-CM | POA: Diagnosis not present

## 2020-07-01 DIAGNOSIS — R0902 Hypoxemia: Secondary | ICD-10-CM | POA: Diagnosis present

## 2020-07-01 LAB — CULTURE, BLOOD (SINGLE)
Culture: NO GROWTH
Special Requests: ADEQUATE

## 2020-07-01 MED ORDER — ROSUVASTATIN CALCIUM 20 MG PO TABS
40.0000 mg | ORAL_TABLET | Freq: Every day | ORAL | Status: DC
  Administered 2020-07-02: 40 mg via ORAL
  Filled 2020-07-01 (×4): qty 1

## 2020-07-01 MED ORDER — AMLODIPINE BESYLATE 5 MG PO TABS
10.0000 mg | ORAL_TABLET | Freq: Every day | ORAL | Status: DC
  Administered 2020-07-02: 10 mg via ORAL
  Filled 2020-07-01: qty 2

## 2020-07-01 MED ORDER — ACETAMINOPHEN 325 MG PO TABS
650.0000 mg | ORAL_TABLET | Freq: Four times a day (QID) | ORAL | Status: DC | PRN
Start: 2020-07-01 — End: 2020-07-02

## 2020-07-01 MED ORDER — SEMAGLUTIDE (1 MG/DOSE) 2 MG/1.5ML ~~LOC~~ SOPN: 1.0000 mg | PEN_INJECTOR | SUBCUTANEOUS | Status: DC

## 2020-07-01 MED ORDER — LOSARTAN POTASSIUM 25 MG PO TABS
25.0000 mg | ORAL_TABLET | Freq: Every day | ORAL | Status: DC
  Administered 2020-07-02: 25 mg via ORAL
  Filled 2020-07-01: qty 1

## 2020-07-01 MED ORDER — FUROSEMIDE 40 MG PO TABS
20.0000 mg | ORAL_TABLET | Freq: Every day | ORAL | Status: DC
  Administered 2020-07-02: 20 mg via ORAL
  Filled 2020-07-01: qty 1

## 2020-07-01 MED ORDER — PANTOPRAZOLE SODIUM 40 MG PO TBEC
40.0000 mg | DELAYED_RELEASE_TABLET | Freq: Every day | ORAL | Status: DC
  Administered 2020-07-02: 40 mg via ORAL
  Filled 2020-07-01: qty 1

## 2020-07-01 MED ORDER — PREDNISONE 20 MG PO TABS
20.0000 mg | ORAL_TABLET | Freq: Every day | ORAL | Status: DC
  Administered 2020-07-02: 20 mg via ORAL
  Filled 2020-07-01: qty 1

## 2020-07-01 MED ORDER — LEVOTHYROXINE SODIUM 50 MCG PO TABS
50.0000 ug | ORAL_TABLET | Freq: Every day | ORAL | Status: DC
  Administered 2020-07-02: 50 ug via ORAL
  Filled 2020-07-01: qty 1

## 2020-07-01 MED ORDER — INSULIN ASPART PROT & ASPART (70-30 MIX) 100 UNIT/ML ~~LOC~~ SUSP
50.0000 [IU] | Freq: Two times a day (BID) | SUBCUTANEOUS | Status: DC
  Administered 2020-07-02 (×2): 50 [IU] via SUBCUTANEOUS
  Filled 2020-07-01 (×2): qty 10

## 2020-07-01 MED ORDER — METFORMIN HCL 500 MG PO TABS
1000.0000 mg | ORAL_TABLET | Freq: Two times a day (BID) | ORAL | Status: DC
  Administered 2020-07-02: 1000 mg via ORAL
  Filled 2020-07-01: qty 2

## 2020-07-01 MED ORDER — LEVOTHYROXINE SODIUM 50 MCG PO TABS: 50.0000 ug | ORAL_TABLET | Freq: Every day | ORAL | Status: DC

## 2020-07-01 MED ORDER — ALBUTEROL SULFATE HFA 108 (90 BASE) MCG/ACT IN AERS: 1.0000 | INHALATION_SPRAY | Freq: Four times a day (QID) | RESPIRATORY_TRACT | Status: DC | PRN

## 2020-07-01 MED ORDER — AZITHROMYCIN 250 MG PO TABS
250.0000 mg | ORAL_TABLET | Freq: Every day | ORAL | Status: AC
  Administered 2020-07-02: 250 mg via ORAL
  Filled 2020-07-01: qty 1

## 2020-07-01 MED ORDER — FLUTICASONE FUROATE-VILANTEROL 200-25 MCG/INH IN AEPB: 1.0000 | INHALATION_SPRAY | Freq: Every day | RESPIRATORY_TRACT | Status: DC

## 2020-07-01 MED ORDER — EZETIMIBE 10 MG PO TABS
10.0000 mg | ORAL_TABLET | Freq: Every day | ORAL | Status: DC
  Administered 2020-07-02: 10 mg via ORAL
  Filled 2020-07-01 (×4): qty 1

## 2020-07-01 MED ORDER — METOPROLOL SUCCINATE ER 25 MG PO TB24
200.0000 mg | ORAL_TABLET | Freq: Every day | ORAL | Status: DC
  Administered 2020-07-02: 200 mg via ORAL
  Filled 2020-07-01: qty 8

## 2020-07-01 MED ORDER — POTASSIUM CHLORIDE CRYS ER 20 MEQ PO TBCR
20.0000 meq | EXTENDED_RELEASE_TABLET | Freq: Every day | ORAL | Status: DC
  Administered 2020-07-02: 20 meq via ORAL
  Filled 2020-07-01: qty 1
  Filled 2020-07-01 (×4): qty 2

## 2020-07-01 MED ORDER — GABAPENTIN 300 MG PO CAPS
300.0000 mg | ORAL_CAPSULE | Freq: Three times a day (TID) | ORAL | Status: DC
  Administered 2020-07-02 (×2): 300 mg via ORAL
  Filled 2020-07-01 (×2): qty 1

## 2020-07-01 MED ORDER — GUAIFENESIN-DM 100-10 MG/5ML PO SYRP: 10.0000 mL | ORAL_SOLUTION | ORAL | Status: DC | PRN

## 2020-07-01 NOTE — ED Notes (Signed)
Barnett Applebaum, RN, [facility] made aware of need for novolog

## 2020-07-01 NOTE — ED Notes (Signed)
Pt given bag lunch and beverage

## 2020-07-01 NOTE — ED Provider Notes (Signed)
[facility] EMERGENCY DEPARTMENT Provider Note   CSN: [id number] Arrival date & time: 07/01/20  1446     History No chief complaint on file.   Timothy Guerrero is a 79 y.o. male.  HPI   79 year old male with a history of anemia, atrial flutter, tremor, CHF, COPD, diabetes, GERD, hard of hearing, hyperlipidemia, hypertension, lung cancer, prostate cancer, shortness of breath, sleep apnea, who presents to the emergency department today for evaluation of hypoxia.  Patient was diagnosed with Covid 5 days ago.  Is admitted to the hospital for a period of time and started on steroids however he refused remdesivir and ultimately was discharged.  He states that since he has been home he has been taking his prednisone but he has grown increasingly short of breath he took his oxygen at home which was 83% on room air.  He is here today requesting a prescription for oxygen.  He declines any other work-up including labs, chest x-ray or other further imaging.  Past Medical History:  Diagnosis Date  . Anemia   . Arthritis   . Atrial flutter ([place])    post op following lobectomy  . Benign essential tremor   . Blood transfusion without reported diagnosis   . Cataract   . CHF (congestive heart failure) ([place])   . Colon polyp    2007, polyp with early cancer, one year f/u no residual polyp. next TCS 2010, see PSH. [facility].  [name] COPD (chronic obstructive pulmonary disease) ([place])   . Diabetes mellitus without complication ([place])    10 years  . GERD (gastroesophageal reflux disease)   . HOH (hard of hearing)   . Hypercholesteremia   . Hypertension    10 years  . Lung cancer ([place])   . Prostate cancer ([place])   . Shortness of breath dyspnea    due to lung mass  . Skin melanoma ([place])   . Sleep apnea    wears CPAP sometimes, cannot tolerate all the time.    Patient Active Problem List   Diagnosis Date Noted  . Pneumonia due to COVID-19 virus 06/26/2020  . Hypophosphatemia  06/26/2020  . COPD (chronic obstructive pulmonary disease) ([place])   . Microcytic anemia   . GERD (gastroesophageal reflux disease)   . Educated about COVID-19 virus infection 03/09/2020  . PAF (paroxysmal atrial fibrillation) ([place]) 03/09/2020  . Coronary artery disease involving native coronary artery of native heart without angina pectoris 03/09/2020  . Pancytopenia ([place]) 03/10/2019  . Depression, recurrent ([place]) 04/03/2018  . Localized, primary osteoarthritis of shoulder region 07/12/2017  . Blood loss anemia 11/07/2016  . Hypothyroidism 11/07/2016  . Chronic systolic HF (heart failure) ([place]) 10/18/2016  . Ischemic cardiomyopathy 10/18/2016  . COPD, moderate ([place]) 06/13/2016  . Restrictive lung disease 06/13/2016  . HOH (hard of hearing)   . History of lung cancer   . Benign essential tremor   . Sleep apnea   . Arthritis   . Syncope 12/02/2015  . Herpes labialis 06/01/2015  . Squamous cell lung cancer ([place]) 03/09/2015  . Atrial flutter ([place])   . S/P lobectomy of lung 12/17/2014  . History of prostate cancer 12/08/2014  . Type 2 diabetes mellitus ([place]) 12/08/2014  . Essential hypertension 12/08/2014  . Melanoma of skin, site unspecified 12/31/2013  . History of colonic polyps 12/08/2013    Past Surgical History:  Procedure Laterality Date  . bottom lobe of right lung removed  12/17/14  . CARDIAC CATHETERIZATION  no PCI  . CATARACT EXTRACTION Left   . CATARACT EXTRACTION W/PHACO Left 01/03/2016   Procedure: CATARACT EXTRACTION PHACO AND INTRAOCULAR LENS PLACEMENT LEFT EYE CDE=7.78;  Surgeon: Williams Che, MD;  Location: [facility];  Service: Ophthalmology;  Laterality: Left;  . CATARACT EXTRACTION W/PHACO Right 03/20/2016   Procedure: CATARACT EXTRACTION PHACO AND INTRAOCULAR LENS PLACEMENT; CDE:  9.58;  Surgeon: Williams Che, MD;  Location: [facility];  Service: Ophthalmology;  Laterality: Right;  . COLONOSCOPY  08/2008   Dr. Leafy Half, [place]:  normal, internal grade 1 hemorrhoids. next TCS 08/2013  . COLONOSCOPY N/A 03/04/2014   Procedure: COLONOSCOPY;  Surgeon: Daneil Dolin, MD;  Location: [facility];  Service: Endoscopy;  Laterality: N/A;  7:30am  . EYE SURGERY    . knee replacements     bilateral. over 10 years ago  . LOBECTOMY Right 12/17/2014   Procedure: RIGHT LOWER LOBECTOMY;  Surgeon: Melrose Nakayama, MD;  Location: [facility];  Service: Thoracic;  Laterality: Right;  . LYMPH NODE DISSECTION Right 12/17/2014   Procedure: LYMPH NODE DISSECTION;  Surgeon: Melrose Nakayama, MD;  Location: [facility];  Service: Thoracic;  Laterality: Right;  . NECK SURGERY     melanoma removed.  [name] PROSTATE SURGERY     prostatectomy, radical  . TOTAL HIP ARTHROPLASTY  2007   left  . total shoulder replacement  2011   left  . VIDEO ASSISTED THORACOSCOPY (VATS)/WEDGE RESECTION Right 12/17/2014   Procedure: RIGHT VIDEO ASSISTED THORACOSCOPY (VATS);  Surgeon: Melrose Nakayama, MD;  Location: [facility];  Service: Thoracic;  Laterality: Right;  [name] VIDEO BRONCHOSCOPY Bilateral 11/17/2016   Procedure: VIDEO BRONCHOSCOPY WITHOUT FLUORO;  Surgeon: Javier Glazier, MD;  Location: [facility];  Service: Cardiopulmonary;  Laterality: Bilateral;  . VIDEO BRONCHOSCOPY N/A 11/01/2017   Procedure: VIDEO BRONCHOSCOPY;  Surgeon: Melrose Nakayama, MD;  Location: [facility] OR;  Service: Thoracic;  Laterality: N/A;       Family History  Problem Relation Age of Onset  . Cancer Mother        unknown type  . COPD Sister   . Breast cancer Sister   . Cancer Sister 6       breast  . Heart attack Daughter   . Prostate cancer Son   . Colon cancer Neg Hx     Social History   Tobacco Use  . Smoking status: Former Smoker    Packs/day: 2.00    Years: 32.00    Pack years: 64.00    Types: Cigarettes    Quit date: 07/03/1986    Years since quitting: 34.0  . Smokeless tobacco: Never Used  Vaping Use  . Vaping Use: Never used  Substance Use Topics  . Alcohol  use: Yes    Alcohol/week: 0.0 standard drinks    Comment: rarely wine  . Drug use: No    Home Medications Prior to Admission medications   Medication Sig Start Date End Date Taking? Authorizing Provider  acetaminophen (TYLENOL) 325 MG tablet Take 2 tablets (650 mg total) by mouth every 6 (six) hours as needed for mild pain, fever or headache (or Fever >/= 101). 03/11/19   Emokpae, Courage, MD  albuterol (PROVENTIL HFA;VENTOLIN HFA) 108 (90 Base) MCG/ACT inhaler Inhale 1 puff into the lungs every 6 (six) hours as needed for wheezing or shortness of breath.    [provider]  amLODipine (NORVASC) 10 MG tablet TAKE 1 TABLET BY MOUTH EVERY DAY  07/24/19   Dettinger, Fransisca Kaufmann, MD  ASCORBIC ACID PO Take 1 tablet by mouth daily.    [provider]  azithromycin (ZITHROMAX) 250 MG tablet Take 500 mg once, then 250 mg for four days 06/22/20   Evelina Dun A, FNP  cholecalciferol (VITAMIN D3) 25 MCG (1000 UT) tablet Take 1,000 Units by mouth daily.    [provider]  Cyanocobalamin (VITAMIN B-12 PO) Take 1 tablet by mouth daily.    [provider]  ezetimibe (ZETIA) 10 MG tablet Take 1 tablet (10 mg total) by mouth daily. Patient not taking: Reported on 04/01/2020 03/17/20 06/15/20  Minus Breeding, MD  fluticasone furoate-vilanterol (BREO ELLIPTA) 200-25 MCG/INH AEPB Inhale 1 puff into the lungs daily. Patient not taking: No sig reported 11/03/19   Dettinger, Fransisca Kaufmann, MD  furosemide (LASIX) 20 MG tablet Take 1 tablet (20 mg total) by mouth daily. Patient not taking: No sig reported 11/03/19   Dettinger, Fransisca Kaufmann, MD  gabapentin (NEURONTIN) 300 MG capsule Take 300 mg by mouth 3 (three) times daily.    [provider]  guaiFENesin-dextromethorphan (ROBITUSSIN DM) 100-10 MG/5ML syrup Take 10 mLs by mouth every 4 (four) hours as needed for cough. 06/27/20   Manuella Ghazi, Pratik D, DO  insulin aspart protamine- aspart (NOVOLOG MIX 70/30) (70-30) 100 UNIT/ML injection Inject  50 Units into the skin 2 (two) times daily.     [provider]  levothyroxine (SYNTHROID) 50 MCG tablet Take 1 tablet (50 mcg total) by mouth daily before breakfast. ((for thyroid)) Patient not taking: No sig reported 11/03/19   Dettinger, Fransisca Kaufmann, MD  losartan (COZAAR) 50 MG tablet Take 25 mg by mouth daily.  01/03/19   [provider]  magnesium oxide (MAG-OX) 400 MG tablet Take 400 mg by mouth 2 (two) times daily.    [provider]  metFORMIN (GLUCOPHAGE) 1000 MG tablet Take 1 tablet (1,000 mg total) by mouth 2 (two) times daily with a meal. Needs to be seen before next refill. 11/03/19   Dettinger, Fransisca Kaufmann, MD  metoprolol (TOPROL-XL) 200 MG 24 hr tablet Take 1 tablet (200 mg total) by mouth daily. 11/03/19   Dettinger, Fransisca Kaufmann, MD  mupirocin ointment (BACTROBAN) 2 % APPLY TO AFFECTED AREA EVERY DAY Patient not taking: No sig reported 06/23/19   [provider]  pantoprazole (PROTONIX) 40 MG tablet Take 1 tablet (40 mg total) by mouth daily. 11/03/19   Dettinger, Fransisca Kaufmann, MD  Potassium Chloride ER 20 MEQ TBCR Take 20 mEq by mouth daily. Take only on days when taking lasix 11/03/19 04/01/20  Dettinger, Fransisca Kaufmann, MD  predniSONE (DELTASONE) 20 MG tablet Take 1 tablet (20 mg total) by mouth daily for 5 days. 06/27/20 07/02/20  Manuella Ghazi, Pratik D, DO  rosuvastatin (CRESTOR) 40 MG tablet Take 1 tablet (40 mg total) by mouth daily. 03/10/20 06/08/20  Minus Breeding, MD  Semaglutide, 1 MG/DOSE, (OZEMPIC, 1 MG/DOSE,) 2 MG/1.5ML SOPN Inject 1 mg into the skin once a week. 11/03/19   Dettinger, Fransisca Kaufmann, MD  Tiotropium Bromide-Olodaterol (STIOLTO RESPIMAT IN) Inhale 1 puff into the lungs daily as needed (shortness of breath). Patient not taking: Reported on 06/26/2020    [provider]  triamcinolone cream (KENALOG) 0.1 % Apply 1 application topically 2 (two) times daily. Patient not taking: Reported on 06/26/2020 01/19/18   [name], MD  valACYclovir (VALTREX) 1000  MG tablet Take 1,000 mg by mouth daily as needed (cold sores).  [provider]    Allergies    Patient has no known allergies.  Review of Systems   Review of Systems  Constitutional: Negative for fever.  HENT: Negative for ear pain and sore throat.   Eyes: Negative for pain and visual disturbance.  Respiratory: Positive for cough and shortness of breath.   Cardiovascular: Negative for chest pain.  Gastrointestinal: Negative for abdominal pain, constipation, diarrhea, nausea and vomiting.  Genitourinary: Negative for dysuria and hematuria.  Musculoskeletal: Negative for back pain.  Skin: Negative for rash.  Neurological: Negative for syncope.  All other systems reviewed and are negative.   Physical Exam Updated Vital Signs BP 139/78   Pulse 90   Temp 98.5 F (36.9 C) (Oral)   Resp (!) 25   SpO2 93%   Physical Exam Vitals and nursing note reviewed.  Constitutional:      Appearance: He is well-developed and well-nourished.  HENT:     Head: Normocephalic and atraumatic.  Eyes:     Conjunctiva/sclera: Conjunctivae normal.  Cardiovascular:     Rate and Rhythm: Normal rate and regular rhythm.     Heart sounds: Normal heart sounds. No murmur heard.   Pulmonary:     Effort: Pulmonary effort is normal. No respiratory distress.     Breath sounds: Normal breath sounds. No wheezing, rhonchi or rales.  Abdominal:     General: Bowel sounds are normal.     Palpations: Abdomen is soft.     Tenderness: There is no abdominal tenderness. There is no guarding or rebound.  Musculoskeletal:        General: No edema.     Cervical back: Neck supple.  Skin:    General: Skin is warm and dry.  Neurological:     Mental Status: He is alert.  Psychiatric:        Mood and Affect: Mood and affect normal.     ED Results / Procedures / Treatments   Labs (all labs ordered are listed, but only abnormal results are displayed) Labs Reviewed - No data to  display  EKG None  Radiology No results found.  Procedures Procedures (including critical care time)  Medications Ordered in ED Medications  acetaminophen (TYLENOL) tablet 650 mg (has no administration in time range)  albuterol (VENTOLIN HFA) 108 (90 Base) MCG/ACT inhaler 1 puff (has no administration in time range)  amLODipine (NORVASC) tablet 10 mg (has no administration in time range)  azithromycin (ZITHROMAX) tablet 250 mg (has no administration in time range)  metFORMIN (GLUCOPHAGE) tablet 1,000 mg (has no administration in time range)  fluticasone furoate-vilanterol (BREO ELLIPTA) 200-25 MCG/INH 1 puff (has no administration in time range)  furosemide (LASIX) tablet 20 mg (has no administration in time range)  gabapentin (NEURONTIN) capsule 300 mg (has no administration in time range)  guaiFENesin-dextromethorphan (ROBITUSSIN DM) 100-10 MG/5ML syrup 10 mL (has no administration in time range)  insulin aspart protamine- aspart (NOVOLOG MIX 70/30) injection 50 Units (has no administration in time range)  metoprolol succinate (TOPROL-XL) 24 hr tablet 200 mg (has no administration in time range)  pantoprazole (PROTONIX) EC tablet 40 mg (has no administration in time range)  potassium chloride (KLOR-CON) CR tablet 20 mEq (has no administration in time range)  predniSONE (DELTASONE) tablet 20 mg (has no administration in time range)  rosuvastatin (CRESTOR) tablet 40 mg (has no administration in time range)  Semaglutide (1 MG/DOSE) SOPN 1 mg (has no administration in time range)  losartan (COZAAR) tablet 25 mg (has no  administration in time range)  ezetimibe (ZETIA) tablet 10 mg (has no administration in time range)  levothyroxine (SYNTHROID) tablet 50 mcg (has no administration in time range)    ED Course  I have reviewed the triage vital signs and the nursing notes.  Pertinent labs & imaging results that were available during my care of the patient were reviewed by me and  considered in my medical decision making (see chart for details).      Durable Medical Equipment  (From admission, onward)         Start     Ordered   07/01/20 2157  For home use only DME oxygen  Once       Question Answer Comment  Length of Need 6 Months   Mode or (Route) Nasal cannula   Liters per Minute 2   Frequency Continuous (stationary and portable oxygen unit needed)   Oxygen conserving device Yes   Oxygen delivery system Gas      07/01/20 2156            MDM Rules/Calculators/A&P                          79 year old male with known Covid presenting to the emergency department today for evaluation of hypoxia.  He is requesting oxygen therapy and would like to be discharged home with this.  He is refusing remdesivir.  He has already been on steroids at home.  He is referred refusing any other work-up such as laboratory work, chest x-ray or further imaging.  9:55 PM consult with social work who states that it would not be possible to get oxygen tonight as all of the company is are closed since it is 10 PM however she recommended I place an order in the patient's chart and social work will work on getting him oxygen to be discharged home with tomorrow morning.  Reassessed the patient.  Discussed plan to board in the emergency department in anticipation of getting set up with oxygen at home.  He does state that if he requires admission he would be open to this in order to get oxygen however he continues to decline remdesivir or further work-up at this time.  I have ordered his home medications.  At shift change, care was transitioned to Dr. Christy Gentles with plan to follow-up on social work consults and oxygen orders tomorrow  Final Clinical Impression(s) / ED Diagnoses Final diagnoses:  COVID  Hypoxia    Rx / DC Orders ED Discharge Orders    None       Bishop Dublin 07/01/20 2258    Varney Biles, MD 07/02/20 1546

## 2020-07-01 NOTE — Discharge Instructions (Signed)
Please follow up with your primary care provider within 5-7 days for re-evaluation of your symptoms. If you do not have a primary care provider, information for a healthcare clinic has been provided for you to make arrangements for follow up care. Please return to the emergency department for any new or worsening symptoms. ° °

## 2020-07-01 NOTE — ED Triage Notes (Signed)
States his oxygen was running 83 percent at home. States he is not on home oxygen

## 2020-07-01 NOTE — Telephone Encounter (Signed)
Pt called and stated that his o2 level was at 83%. He said he is Covid positve. Told pt he should go to ED immediately. He said he didn't want to stay all night. I instructed him to go now.

## 2020-07-01 NOTE — Clinical Social Work Note (Signed)
CSW received call from [employer], PA-C that pt is in need of home O2 and if it could be done tonight. CSW informed that due to time there are no companies still open or that have drivers this late in the evening. CSW updated that TOC can f/u in AM to set up home O2 if pt meets requirements. CSW informedMs. Couture, PA that pt will need home O2 orders and a sat qualification note. CSW was asked if pt would need to be admitted, CSW informed Ms. Couture, PA that TOC cannot make that decision. TOC to follow in AM to set up home O2.

## 2020-07-02 NOTE — ED Notes (Signed)
Pt was unable to walk without O2. O2 levels dropped to 78% upon standing.

## 2020-07-02 NOTE — Clinical Social Work Note (Signed)
Transition of Care [facility]) - Emergency Department Mini Assessment  Patient Details  Name: Timothy Guerrero MRN: [id number] Date of Birth: 03/20/1941  Transition of Care [facility]) CM/SW Contact:    Sherie Don, LCSW Phone Number: 07/02/2020, 11:50 AM  Clinical Narrative: Patient is a 79 year old male who presented to the ED for low oxygen. TOC received consult for home O2. CSW spoke with patient and received verbal permission to make referral to [place].  CSW called [employer] and made referral to [place]. CSW received call from [place] with [employer] that the technician is en route with O2. CSW updated patient and RN. TOC signing off.  ED Mini Assessment: What brought you to the Emergency Department? : Low oxygen Barriers to Discharge: ED DME delivery,ED Barriers Resolved Barrier interventions: O2 referral made to [facility] [place] of departure: Car Interventions which prevented an admission or readmission: DME Provided  Patient Contact and Communications Key Contact 1: [employer] Spoke with: Skip Mayer Date: 07/02/20 Contact time: 32 Contact Phone Number: [phone] Call outcome: O2 will be delivered to ED Patient states their goals for this hospitalization and ongoing recovery are:: Discharge home with oxygen CMS Medicare.gov Compare Post Acute Care list provided to:: Patient Choice offered to / list presented to : Patient  Admission diagnosis:  low O2 Patient Active Problem List   Diagnosis Date Noted  . Pneumonia due to COVID-19 virus 06/26/2020  . Hypophosphatemia 06/26/2020  . COPD (chronic obstructive pulmonary disease) ([place])   . Microcytic anemia   . GERD (gastroesophageal reflux disease)   . Educated about COVID-19 virus infection 03/09/2020  . PAF (paroxysmal atrial fibrillation) ([place]) 03/09/2020  . Coronary artery disease involving native coronary artery of native heart without angina pectoris 03/09/2020  . Pancytopenia ([place]) 03/10/2019  . Depression, recurrent  ([place]) 04/03/2018  . Localized, primary osteoarthritis of shoulder region 07/12/2017  . Blood loss anemia 11/07/2016  . Hypothyroidism 11/07/2016  . Chronic systolic HF (heart failure) ([place]) 10/18/2016  . Ischemic cardiomyopathy 10/18/2016  . COPD, moderate ([place]) 06/13/2016  . Restrictive lung disease 06/13/2016  . HOH (hard of hearing)   . History of lung cancer   . Benign essential tremor   . Sleep apnea   . Arthritis   . Syncope 12/02/2015  . Herpes labialis 06/01/2015  . Squamous cell lung cancer ([place]) 03/09/2015  . Atrial flutter ([place])   . S/P lobectomy of lung 12/17/2014  . History of prostate cancer 12/08/2014  . Type 2 diabetes mellitus ([place]) 12/08/2014  . Essential hypertension 12/08/2014  . Melanoma of skin, site unspecified 12/31/2013  . History of colonic polyps 12/08/2013   PCP:  Dettinger, Fransisca Kaufmann, MD Pharmacy:   [id number] - [place], [place] [address] Phone: [phone] Fax: [phone]

## 2020-07-02 NOTE — ED Notes (Addendum)
SATURATION QUALIFICATIONS: (This note is used to comply with regulatory documentation for home oxygen)  Patient Saturations on Room Air at Rest = 89%  Patient Saturations on Room Air while Ambulating = 78%  Patient Saturations on 2 Liters of oxygen while Ambulating = 92%  Please briefly explain why patient needs home oxygen:sob, hypoxia

## 2020-07-02 NOTE — ED Notes (Signed)
Pt stable- a/w social work consult in the morning.

## 2020-07-07 DIAGNOSIS — U071 COVID-19: Secondary | ICD-10-CM | POA: Diagnosis not present

## 2020-07-15 ENCOUNTER — Encounter: Payer: Self-pay | Admitting: Family Medicine

## 2020-07-15 ENCOUNTER — Other Ambulatory Visit: Payer: Self-pay

## 2020-07-15 ENCOUNTER — Ambulatory Visit (INDEPENDENT_AMBULATORY_CARE_PROVIDER_SITE_OTHER): Payer: Medicare HMO | Admitting: Family Medicine

## 2020-07-15 VITALS — BP 152/81 | HR 104 | Ht 72.0 in | Wt 193.0 lb

## 2020-07-15 DIAGNOSIS — U071 COVID-19: Secondary | ICD-10-CM

## 2020-07-15 DIAGNOSIS — J1282 Pneumonia due to coronavirus disease 2019: Secondary | ICD-10-CM

## 2020-07-15 DIAGNOSIS — E1159 Type 2 diabetes mellitus with other circulatory complications: Secondary | ICD-10-CM | POA: Diagnosis not present

## 2020-07-15 DIAGNOSIS — I10 Essential (primary) hypertension: Secondary | ICD-10-CM | POA: Diagnosis not present

## 2020-07-15 DIAGNOSIS — E039 Hypothyroidism, unspecified: Secondary | ICD-10-CM

## 2020-07-15 LAB — BAYER DCA HB A1C WAIVED: HB A1C (BAYER DCA - WAIVED): 7.2 % — ABNORMAL HIGH (ref <7.0)

## 2020-07-15 MED ORDER — AMLODIPINE BESYLATE 10 MG PO TABS: 10.0000 mg | ORAL_TABLET | Freq: Every day | ORAL | 3 refills | Status: AC

## 2020-07-15 MED ORDER — LEVOTHYROXINE SODIUM 50 MCG PO TABS: 50.0000 ug | ORAL_TABLET | Freq: Every day | ORAL | 3 refills | Status: AC

## 2020-07-15 NOTE — Progress Notes (Signed)
BP (!) 152/81   Pulse (!) 104   Ht 6' (1.829 m)   Wt 193 lb (87.5 kg)   SpO2 98%   BMI 26.18 kg/m    Subjective:   Patient ID: Timothy Guerrero., male    DOB: Jul 19, 1940, 80 y.o.   MRN: [id number]  HPI: Timothy Guerrero. is a 80 y.o. male presenting on 07/15/2020 for Hospitalization Follow-up (S/P covid)   HPI COVID-pneumonia hospital follow-up Patient is coming in today for COVID-pneumonia hospital follow-up.  He was in the ER initially on 06/26/2020 and then again on 07/01/2020.  He is nonvaccinated. He feels like his breathing is back to normal and he has some fatigue but otherwise doing well  Type 2 diabetes mellitus Patient comes in today for recheck of his diabetes. Patient has been currently taking metformin. Patient is currently on an ACE inhibitor/ARB. Patient has not seen an ophthalmologist this year. Patient denies any issues with their feet. The symptom started onset as an adult hypothyroid and htn and CHF ARE RELATED TO DM   Hypothyroidism recheck Patient is coming in for thyroid recheck today as well. They deny any issues with hair changes or heat or cold problems or diarrhea or constipation. They deny any chest pain or palpitations. They are currently on levothyroxine 15mcrograms   Hypertension Patient is currently on furosemide and losartan and metoprolol amlodipine, and their blood pressure today is 152/81. Patient denies any lightheadedness or dizziness. Patient denies headaches, blurred vision, chest pains, shortness of breath, or weakness. Denies any side effects from medication and is content with current medication.   Relevant past medical, surgical, family and social history reviewed and updated as indicated. Interim medical history since our last visit reviewed. Allergies and medications reviewed and updated.  Review of Systems  Constitutional: Positive for fatigue. Negative for chills and fever.  HENT: Negative for congestion.   Eyes:  Negative for discharge.  Respiratory: Positive for cough. Negative for shortness of breath and wheezing.   Cardiovascular: Negative for chest pain and leg swelling.  Musculoskeletal: Negative for back pain and gait problem.  Skin: Negative for rash.  All other systems reviewed and are negative.   Per HPI unless specifically indicated above   Allergies as of 07/15/2020   No Known Allergies     Medication List       Accurate as of July 15, 2020 11:59 PM. If you have any questions, ask your nurse or doctor.        STOP taking these medications   azithromycin 250 MG tablet Commonly known as: ZITHROMAX Stopped by: JFransisca KaufmannDettinger, MD   guaiFENesin-dextromethorphan 100-10 MG/5ML syrup Commonly known as: ROBITUSSIN DM Stopped by: JFransisca KaufmannDettinger, MD     TAKE these medications   acetaminophen 325 MG tablet Commonly known as: TYLENOL Take 2 tablets (650 mg total) by mouth every 6 (six) hours as needed for mild pain, fever or headache (or Fever >/= 101).   albuterol 108 (90 Base) MCG/ACT inhaler Commonly known as: VENTOLIN HFA Inhale 1 puff into the lungs every 6 (six) hours as needed for wheezing or shortness of breath.   amLODipine 10 MG tablet Commonly known as: NORVASC Take 1 tablet (10 mg total) by mouth daily.   ASCORBIC ACID PO Take 1 tablet by mouth daily.   Breo Ellipta 200-25 MCG/INH Aepb Generic drug: fluticasone furoate-vilanterol Inhale 1 puff into the lungs daily.   cholecalciferol 25 MCG (1000 UNIT) tablet Commonly known as: VITAMIN  D3 Take 1,000 Units by mouth daily.   ezetimibe 10 MG tablet Commonly known as: ZETIA Take 1 tablet (10 mg total) by mouth daily.   furosemide 20 MG tablet Commonly known as: LASIX Take 1 tablet (20 mg total) by mouth daily.   gabapentin 300 MG capsule Commonly known as: NEURONTIN Take 300 mg by mouth 3 (three) times daily.   insulin aspart protamine- aspart (70-30) 100 UNIT/ML injection Commonly known as:  NOVOLOG MIX 70/30 Inject 50 Units into the skin 2 (two) times daily.   levothyroxine 50 MCG tablet Commonly known as: SYNTHROID Take 1 tablet (50 mcg total) by mouth daily before breakfast. ((for thyroid))   losartan 50 MG tablet Commonly known as: COZAAR Take 25 mg by mouth daily.   magnesium oxide 400 MG tablet Commonly known as: MAG-OX Take 400 mg by mouth 2 (two) times daily.   metFORMIN 1000 MG tablet Commonly known as: GLUCOPHAGE Take 1 tablet (1,000 mg total) by mouth 2 (two) times daily with a meal. Needs to be seen before next refill.   metoprolol 200 MG 24 hr tablet Commonly known as: TOPROL-XL Take 1 tablet (200 mg total) by mouth daily.   mupirocin ointment 2 % Commonly known as: BACTROBAN APPLY TO AFFECTED AREA EVERY DAY   Ozempic (1 MG/DOSE) 2 MG/1.5ML Sopn Generic drug: Semaglutide (1 MG/DOSE) Inject 1 mg into the skin once a week.   pantoprazole 40 MG tablet Commonly known as: PROTONIX Take 1 tablet (40 mg total) by mouth daily.   Potassium Chloride ER 20 MEQ Tbcr Take 20 mEq by mouth daily. Take only on days when taking lasix   rosuvastatin 40 MG tablet Commonly known as: CRESTOR Take 1 tablet (40 mg total) by mouth daily.   STIOLTO RESPIMAT IN Inhale 1 puff into the lungs daily as needed (shortness of breath).   triamcinolone 0.1 % Commonly known as: KENALOG Apply 1 application topically 2 (two) times daily.   valACYclovir 1000 MG tablet Commonly known as: VALTREX Take 1,000 mg by mouth daily as needed (cold sores).   VITAMIN B-12 PO Take 1 tablet by mouth daily.        Objective:   BP (!) 152/81   Pulse (!) 104   Ht 6' (1.829 m)   Wt 193 lb (87.5 kg)   SpO2 98%   BMI 26.18 kg/m   Wt Readings from Last 3 Encounters:  07/15/20 193 lb (87.5 kg)  06/26/20 190 lb (86.2 kg)  03/10/20 198 lb (89.8 kg)    Physical Exam Vitals and nursing note reviewed.  Constitutional:      General: He is not in acute distress.    Appearance: He  is well-developed and well-nourished. He is not diaphoretic.  Eyes:     General: No scleral icterus.    Extraocular Movements: EOM normal.     Conjunctiva/sclera: Conjunctivae normal.  Neck:     Thyroid: No thyromegaly.  Cardiovascular:     Rate and Rhythm: Normal rate and regular rhythm.     Pulses: Intact distal pulses.     Heart sounds: Normal heart sounds. No murmur heard.   Pulmonary:     Effort: Pulmonary effort is normal. No respiratory distress.     Breath sounds: Normal breath sounds. No wheezing.  Musculoskeletal:        General: No edema. Normal range of motion.     Cervical back: Neck supple.  Lymphadenopathy:     Cervical: No cervical adenopathy.  Skin:  General: Skin is warm and dry.     Findings: No rash.  Neurological:     Mental Status: He is alert and oriented to person, place, and time.     Coordination: Coordination normal.  Psychiatric:        Mood and Affect: Mood and affect normal.        Behavior: Behavior normal.       Assessment & Plan:   Problem List Items Addressed This Visit      Cardiovascular and Mediastinum   Essential hypertension   Relevant Medications   amLODipine (NORVASC) 10 MG tablet   Other Relevant Orders   CBC with Differential/Platelet (Completed)   CMP14+EGFR (Completed)   Lipid panel (Completed)     Respiratory   Pneumonia due to COVID-19 virus - Primary   Relevant Orders   CBC with Differential/Platelet (Completed)   CMP14+EGFR (Completed)     Endocrine   Type 2 diabetes mellitus ([place])   Relevant Orders   Bayer DCA Hb A1c Waived (Completed)   CBC with Differential/Platelet (Completed)   Hypothyroidism   Relevant Medications   levothyroxine (SYNTHROID) 50 MCG tablet   Other Relevant Orders   TSH (Completed)      Will do blood work, patient's breathing is doing better but he still using the occasional oxygen at home 2 L nasal cannula, his oxygen is 98% on room air here right after he walked in.  Continue  to use the oxygen, he is mostly using it at night right now and he is monitoring.  He says it is mostly staying above 90 now. Follow up plan: Return in about 3 months (around 10/13/2020), or if symptoms worsen or fail to improve, for Recheck thyroid and breathing.  And diabetes.  Counseling provided for all of the vaccine components Orders Placed This Encounter  Procedures  . Bayer DCA Hb A1c Waived  . CBC with Differential/Platelet  . CMP14+EGFR  . Lipid panel  . TSH    Caryl Pina, MD [facility] 07/25/2020, 9:52 PM

## 2020-07-16 LAB — CMP14+EGFR
ALT: 6 IU/L (ref 0–44)
AST: 13 IU/L (ref 0–40)
Albumin/Globulin Ratio: 1.4 (ref 1.2–2.2)
Albumin: 3.6 g/dL — ABNORMAL LOW (ref 3.7–4.7)
Alkaline Phosphatase: 68 IU/L (ref 44–121)
BUN/Creatinine Ratio: 9 — ABNORMAL LOW (ref 10–24)
BUN: 10 mg/dL (ref 8–27)
Bilirubin Total: 0.3 mg/dL (ref 0.0–1.2)
CO2: 27 mmol/L (ref 20–29)
Calcium: 9.6 mg/dL (ref 8.6–10.2)
Chloride: 104 mmol/L (ref 96–106)
Creatinine, Ser: 1.08 mg/dL (ref 0.76–1.27)
GFR calc Af Amer: 75 mL/min/{1.73_m2} (ref >59)
GFR calc non Af Amer: 65 mL/min/{1.73_m2} (ref >59)
Globulin, Total: 2.5 g/dL (ref 1.5–4.5)
Glucose: 261 mg/dL — ABNORMAL HIGH (ref 65–99)
Potassium: 4.3 mmol/L (ref 3.5–5.2)
Sodium: 146 mmol/L — ABNORMAL HIGH (ref 134–144)
Total Protein: 6.1 g/dL (ref 6.0–8.5)

## 2020-07-16 LAB — CBC WITH DIFFERENTIAL/PLATELET
Basophils Absolute: 0 10*3/uL (ref 0.0–0.2)
Basos: 0 %
EOS (ABSOLUTE): 0.3 10*3/uL (ref 0.0–0.4)
Eos: 3 %
Hematocrit: 35.2 % — ABNORMAL LOW (ref 37.5–51.0)
Hemoglobin: 10 g/dL — ABNORMAL LOW (ref 13.0–17.7)
Immature Grans (Abs): 0 10*3/uL (ref 0.0–0.1)
Immature Granulocytes: 0 %
Lymphocytes Absolute: 1.2 10*3/uL (ref 0.7–3.1)
Lymphs: 13 %
MCH: 21.3 pg — ABNORMAL LOW (ref 26.6–33.0)
MCHC: 28.4 g/dL — ABNORMAL LOW (ref 31.5–35.7)
MCV: 75 fL — ABNORMAL LOW (ref 79–97)
Monocytes Absolute: 0.9 10*3/uL (ref 0.1–0.9)
Monocytes: 9 %
Neutrophils Absolute: 6.9 10*3/uL (ref 1.4–7.0)
Neutrophils: 75 %
Platelets: 383 10*3/uL (ref 150–450)
RBC: 4.69 x10E6/uL (ref 4.14–5.80)
RDW: 19.7 % — ABNORMAL HIGH (ref 11.6–15.4)
WBC: 9.3 10*3/uL (ref 3.4–10.8)

## 2020-07-16 LAB — LIPID PANEL
Chol/HDL Ratio: 3.9 ratio (ref 0.0–5.0)
Cholesterol, Total: 168 mg/dL (ref 100–199)
HDL: 43 mg/dL (ref >39)
LDL Chol Calc (NIH): 99 mg/dL (ref 0–99)
Triglycerides: 146 mg/dL (ref 0–149)
VLDL Cholesterol Cal: 26 mg/dL (ref 5–40)

## 2020-07-16 LAB — TSH: TSH: 3.59 u[IU]/mL (ref 0.450–4.500)

## 2020-07-26 ENCOUNTER — Ambulatory Visit: Payer: Self-pay | Admitting: Radiation Oncology

## 2020-07-26 ENCOUNTER — Telehealth: Payer: Self-pay | Admitting: *Deleted

## 2020-07-26 NOTE — Telephone Encounter (Signed)
Called patient to inform that his fu for today has been moved to 08/26/20, due to his scan not being done until 08-13-20, lvm for a return call

## 2020-08-07 DIAGNOSIS — U071 COVID-19: Secondary | ICD-10-CM | POA: Diagnosis not present

## 2020-08-09 ENCOUNTER — Telehealth: Payer: Self-pay

## 2020-08-09 MED ORDER — CLONAZEPAM 0.5 MG PO TABS: 0.5000 mg | ORAL_TABLET | Freq: Once | ORAL | 0 refills | Status: AC

## 2020-08-09 NOTE — Telephone Encounter (Signed)
I sent a dose of clonazepam for the patient.

## 2020-08-09 NOTE — Telephone Encounter (Signed)
Does patient need an appointment?

## 2020-08-13 ENCOUNTER — Inpatient Hospital Stay (HOSPITAL_COMMUNITY): Payer: Medicare HMO | Attending: Hematology

## 2020-08-13 ENCOUNTER — Ambulatory Visit (HOSPITAL_COMMUNITY)
Admission: RE | Admit: 2020-08-13 | Discharge: 2020-08-13 | Disposition: A | Discharge status: Home / Self Care | Payer: Medicare HMO | Source: Ambulatory Visit | Attending: Hematology | Admitting: Hematology

## 2020-08-13 ENCOUNTER — Other Ambulatory Visit: Payer: Self-pay

## 2020-08-13 DIAGNOSIS — C3491 Malignant neoplasm of unspecified part of right bronchus or lung: Secondary | ICD-10-CM | POA: Insufficient documentation

## 2020-08-13 DIAGNOSIS — Z923 Personal history of irradiation: Secondary | ICD-10-CM | POA: Diagnosis not present

## 2020-08-13 DIAGNOSIS — C3431 Malignant neoplasm of lower lobe, right bronchus or lung: Secondary | ICD-10-CM | POA: Diagnosis not present

## 2020-08-13 DIAGNOSIS — D61818 Other pancytopenia: Secondary | ICD-10-CM | POA: Insufficient documentation

## 2020-08-13 DIAGNOSIS — Z8546 Personal history of malignant neoplasm of prostate: Secondary | ICD-10-CM | POA: Diagnosis not present

## 2020-08-13 DIAGNOSIS — Z85820 Personal history of malignant melanoma of skin: Secondary | ICD-10-CM | POA: Diagnosis not present

## 2020-08-13 DIAGNOSIS — J432 Centrilobular emphysema: Secondary | ICD-10-CM | POA: Diagnosis not present

## 2020-08-13 LAB — CBC WITH DIFFERENTIAL/PLATELET
Abs Immature Granulocytes: 0.01 10*3/uL (ref 0.00–0.07)
Basophils Absolute: 0.1 10*3/uL (ref 0.0–0.1)
Basophils Relative: 1 %
Eosinophils Absolute: 0.2 10*3/uL (ref 0.0–0.5)
Eosinophils Relative: 3 %
HCT: 32.3 % — ABNORMAL LOW (ref 39.0–52.0)
Hemoglobin: 9.2 g/dL — ABNORMAL LOW (ref 13.0–17.0)
Immature Granulocytes: 0 %
Lymphocytes Relative: 15 %
Lymphs Abs: 1 10*3/uL (ref 0.7–4.0)
MCH: 21.7 pg — ABNORMAL LOW (ref 26.0–34.0)
MCHC: 28.5 g/dL — ABNORMAL LOW (ref 30.0–36.0)
MCV: 76.4 fL — ABNORMAL LOW (ref 80.0–100.0)
Monocytes Absolute: 0.8 10*3/uL (ref 0.1–1.0)
Monocytes Relative: 12 %
Neutro Abs: 4.6 10*3/uL (ref 1.7–7.7)
Neutrophils Relative %: 69 %
Platelets: 324 10*3/uL (ref 150–400)
RBC: 4.23 MIL/uL (ref 4.22–5.81)
RDW: 20.2 % — ABNORMAL HIGH (ref 11.5–15.5)
WBC: 6.5 10*3/uL (ref 4.0–10.5)
nRBC: 0 % (ref 0.0–0.2)

## 2020-08-13 LAB — IRON AND TIBC
Iron: 10 ug/dL — ABNORMAL LOW (ref 45–182)
Saturation Ratios: 2 % — ABNORMAL LOW (ref 17.9–39.5)
TIBC: 479 ug/dL — ABNORMAL HIGH (ref 250–450)
UIBC: 469 ug/dL

## 2020-08-13 LAB — COMPREHENSIVE METABOLIC PANEL
ALT: 12 U/L (ref 0–44)
AST: 21 U/L (ref 15–41)
Albumin: 3.9 g/dL (ref 3.5–5.0)
Alkaline Phosphatase: 51 U/L (ref 38–126)
Anion gap: 11 (ref 5–15)
BUN: 13 mg/dL (ref 8–23)
CO2: 26 mmol/L (ref 22–32)
Calcium: 9.5 mg/dL (ref 8.9–10.3)
Chloride: 101 mmol/L (ref 98–111)
Creatinine, Ser: 0.86 mg/dL (ref 0.61–1.24)
GFR, Estimated: >60 mL/min (ref >60)
Glucose, Bld: 290 mg/dL — ABNORMAL HIGH (ref 70–99)
Potassium: 4 mmol/L (ref 3.5–5.1)
Sodium: 138 mmol/L (ref 135–145)
Total Bilirubin: 0.7 mg/dL (ref 0.3–1.2)
Total Protein: 6.9 g/dL (ref 6.5–8.1)

## 2020-08-13 LAB — FERRITIN: Ferritin: 8 ng/mL — ABNORMAL LOW (ref 24–336)

## 2020-08-13 MED ORDER — IOHEXOL 300 MG/ML  SOLN
75.0000 mL | Freq: Once | INTRAMUSCULAR | Status: AC | PRN
  Administered 2020-08-13: 75 mL via INTRAVENOUS

## 2020-08-19 ENCOUNTER — Inpatient Hospital Stay (HOSPITAL_COMMUNITY): Payer: Medicare HMO | Admitting: Hematology

## 2020-08-26 ENCOUNTER — Other Ambulatory Visit: Payer: Self-pay

## 2020-08-26 ENCOUNTER — Encounter: Payer: Self-pay | Admitting: *Deleted

## 2020-08-26 ENCOUNTER — Ambulatory Visit
Admission: RE | Admit: 2020-08-26 | Discharge: 2020-08-26 | Disposition: A | Discharge status: Home / Self Care | Payer: Medicare HMO | Source: Ambulatory Visit | Attending: Radiation Oncology | Admitting: Radiation Oncology

## 2020-08-26 DIAGNOSIS — R5383 Other fatigue: Secondary | ICD-10-CM | POA: Insufficient documentation

## 2020-08-26 DIAGNOSIS — Z79899 Other long term (current) drug therapy: Secondary | ICD-10-CM | POA: Diagnosis not present

## 2020-08-26 DIAGNOSIS — J439 Emphysema, unspecified: Secondary | ICD-10-CM | POA: Diagnosis not present

## 2020-08-26 DIAGNOSIS — Z8616 Personal history of COVID-19: Secondary | ICD-10-CM | POA: Insufficient documentation

## 2020-08-26 DIAGNOSIS — I700 Atherosclerosis of aorta: Secondary | ICD-10-CM | POA: Insufficient documentation

## 2020-08-26 DIAGNOSIS — Z8546 Personal history of malignant neoplasm of prostate: Secondary | ICD-10-CM | POA: Diagnosis not present

## 2020-08-26 DIAGNOSIS — Z85820 Personal history of malignant melanoma of skin: Secondary | ICD-10-CM | POA: Diagnosis not present

## 2020-08-26 DIAGNOSIS — C3491 Malignant neoplasm of unspecified part of right bronchus or lung: Secondary | ICD-10-CM

## 2020-08-26 DIAGNOSIS — Z08 Encounter for follow-up examination after completed treatment for malignant neoplasm: Secondary | ICD-10-CM | POA: Diagnosis not present

## 2020-08-26 DIAGNOSIS — Z923 Personal history of irradiation: Secondary | ICD-10-CM | POA: Diagnosis not present

## 2020-08-26 DIAGNOSIS — Z85118 Personal history of other malignant neoplasm of bronchus and lung: Secondary | ICD-10-CM | POA: Insufficient documentation

## 2020-08-26 DIAGNOSIS — D3501 Benign neoplasm of right adrenal gland: Secondary | ICD-10-CM | POA: Insufficient documentation

## 2020-08-26 DIAGNOSIS — Z7984 Long term (current) use of oral hypoglycemic drugs: Secondary | ICD-10-CM | POA: Diagnosis not present

## 2020-08-26 HISTORY — DX: Personal history of irradiation: Z92.3

## 2020-08-26 NOTE — Progress Notes (Signed)
Patient is here today for radiation follow up completed August 2019 to the right lung. Patient reports pain in the right shoulder 8 out of 10.  States he is seeing a Restaurant manager, fast food for that.  Patient reports weight loss since covid (18 lbs).   Patient reports fatigue post covid as well but feels like it is getting better in the last couple of week.  He has insomnia.  Patient reports shortness of breath, states related to COPD.  Denies cough.    Patient requests copy of his CT scan results.  Vitals:   08/26/20 1154  BP: 130/71  Pulse: (!) 101  Resp: 20  Temp: 98.2 F (36.8 C)  SpO2: 100%  Weight: 193 lb (87.5 kg)  Height: 6' (1.829 m)

## 2020-08-26 NOTE — Progress Notes (Signed)
Radiation Oncology         [phone]) [phone] ________________________________  Name: Timothy Guerrero. MRN: [id number]  Date: 08/26/2020  DOB: 02-Dec-1940  Follow-Up Visit Note  CC: Dettinger, Fransisca Kaufmann, MD  Melrose Nakayama, *    ICD-10-CM   1. 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 The Chest was treated to 46 Gy in 23 fractions of 2 Gy. 2. The Chest was boosted to 20 Gy in 10 fractions of 2 Gy.(66 Gy)  Narrative:    Of note, the patient did undergo a hospital admission from 06/26/2020 - 06/27/2021 for acute hypoxemic respiratory failure secondary to COVID-19 pneumonia. Of note, he was seen in the ED again on 07/01/2020 for similar symptoms.  Patient's breathing improved with the use of steroids.  He refused to receive remdesivir while he was briefly in the hospital.  He tells me that he would refuse intubation if it came to that in the future.  He has never received the COVID-19 vaccine and does not wish to receive this vaccine.  Chest CT scan on 08/13/2020 showed new moderate patchy peripheral ground-glass opacities in both lungs, asymmetrically prominent in the left lung, most prominent in the left upper lobe. Findings were non-specific with differentials including atypical/viral infection such as due to COVID-19, post-inflammatory fibrosis, or other inflammatory etiologies. There were no findings suspicious for local tumor recurrence in the right lung status post right lower lobectomy. There was no evidence of metastatic disease in the chest.  On review of systems, he reports improvement in his fatigue the further he gets away from its COVID-19 infection his breathing also slowly improves.  He is on no supplemental  oxygen at home.  He denies any pain within the chest area significant cough or hemoptysis.[name]   ALLERGIES:  has No Known Allergies.  Meds: Current Outpatient Medications  Medication Sig Dispense Refill  . acetaminophen (TYLENOL) 325 MG tablet Take 2 tablets (650 mg total) by mouth every 6 (six) hours as needed for mild pain, fever or headache (or Fever >/= 101). 12 tablet 1  . albuterol (PROVENTIL HFA;VENTOLIN HFA) 108 (90 Base) MCG/ACT inhaler Inhale 1 puff into the lungs every 6 (six) hours as needed for wheezing or shortness of breath.    [name] amLODipine (NORVASC) 10 MG tablet Take 1 tablet (10 mg total) by mouth daily. 90 tablet 3  . ASCORBIC ACID PO Take 1 tablet by mouth daily.    . cholecalciferol (VITAMIN D3) 25 MCG (1000 UT) tablet Take 1,000 Units by mouth daily.    . Cyanocobalamin (VITAMIN B-12 PO) Take 1 tablet by mouth daily.    . fluticasone furoate-vilanterol (BREO ELLIPTA) 200-25 MCG/INH AEPB Inhale 1 puff into the lungs daily. 1 each 6  . furosemide (LASIX) 20 MG tablet Take 1 tablet (20 mg total) by mouth daily. 90 tablet 3  . gabapentin (NEURONTIN) 300 MG capsule Take 300 mg by mouth 3 (three) times daily.    . insulin aspart protamine- aspart (NOVOLOG MIX 70/30) (70-30) 100 UNIT/ML injection Inject 50 Units into the skin 2 (two) times daily.     [name] levothyroxine (SYNTHROID) 50 MCG tablet Take 1 tablet (50 mcg total) by mouth daily before breakfast. ((for thyroid)) 90 tablet 3  .  losartan (COZAAR) 50 MG tablet Take 25 mg by mouth daily.     . magnesium oxide (MAG-OX) 400 MG tablet Take 400 mg by mouth 2 (two) times daily.    . metFORMIN (GLUCOPHAGE) 1000 MG tablet Take 1 tablet (1,000 mg total) by mouth 2 (two) times daily with a meal. Needs to be seen before next refill. 180 tablet 3  . metoprolol (TOPROL-XL) 200 MG 24 hr tablet Take 1 tablet (200 mg total) by mouth daily. 90 tablet 3  . mupirocin ointment (BACTROBAN) 2 % APPLY TO AFFECTED AREA EVERY DAY    . pantoprazole  (PROTONIX) 40 MG tablet Take 1 tablet (40 mg total) by mouth daily. 90 tablet 3  . Semaglutide, 1 MG/DOSE, (OZEMPIC, 1 MG/DOSE,) 2 MG/1.5ML SOPN Inject 1 mg into the skin once a week. 1 pen 3  . Tiotropium Bromide-Olodaterol (STIOLTO RESPIMAT IN) Inhale 1 puff into the lungs daily as needed (shortness of breath).    . triamcinolone cream (KENALOG) 0.1 % Apply 1 application topically 2 (two) times daily. 30 g 0  . valACYclovir (VALTREX) 1000 MG tablet Take 1,000 mg by mouth daily as needed (cold sores).     . clonazePAM (KLONOPIN) 0.5 MG tablet Take 1-2 tablets (0.5-1 mg total) by mouth once for 1 dose. 2 tablet 0  . ezetimibe (ZETIA) 10 MG tablet Take 1 tablet (10 mg total) by mouth daily. (Patient not taking: Reported on 04/01/2020) 90 tablet 3  . Potassium Chloride ER 20 MEQ TBCR Take 20 mEq by mouth daily. Take only on days when taking lasix 90 tablet 3  . rosuvastatin (CRESTOR) 40 MG tablet Take 1 tablet (40 mg total) by mouth daily. 90 tablet 3   No current facility-administered medications for this encounter.    Physical Findings: The patient is in no acute distress. Patient is alert and oriented.  height is 6' (1.829 m) and weight is 193 lb (87.5 kg). His temperature is 98.2 F (36.8 C). His blood pressure is 130/71 and his pulse is 101 (abnormal). His respiration is 20 and oxygen saturation is 100%.   Lungs are clear to auscultation bilaterally, no wheezing. Heart has regular rate and rhythm. No palpable cervical, supraclavicular, or axillary adenopathy. Abdomen soft, non-tender, normal bowel sounds.   Lab Findings: Lab Results  Component Value Date   WBC 6.5 08/13/2020   HGB 9.2 (L) 08/13/2020   HCT 32.3 (L) 08/13/2020   MCV 76.4 (L) 08/13/2020   PLT 324 08/13/2020    Radiographic Findings: CT Chest W Contrast  Result Date: 08/15/2020 CLINICAL DATA:  History of right lower lobectomy in 2016 for stage IA non-small cell lung cancer. Additional history of melanoma and prostate  cancer. Pancytopenia. Restaging. EXAM: CT CHEST WITH CONTRAST TECHNIQUE: Multidetector CT imaging of the chest was performed during intravenous contrast administration. CONTRAST:  34mL OMNIPAQUE IOHEXOL 300 MG/ML  SOLN COMPARISON:  01/21/2020 chest CT. FINDINGS: Cardiovascular: Normal heart size. No significant pericardial effusion/thickening. Left main and 3 vessel coronary atherosclerosis. Atherosclerotic nonaneurysmal thoracic aorta. Normal caliber pulmonary arteries. No central pulmonary emboli. Mediastinum/Nodes: No discrete thyroid nodules. Unremarkable esophagus. No pathologically enlarged axillary, mediastinal or hilar lymph nodes. Lungs/Pleura: No pneumothorax. Stable small calcified bilateral pleural plaques, unchanged. No pleural effusions. Status post right lower lobectomy. Mild centrilobular and paraseptal emphysema. No acute consolidative airspace disease, lung masses or significant pulmonary nodules. New moderate patchy peripheral ground-glass opacities in both lungs, asymmetrically prominent in the left lung, most prominent in the left upper lobe. Upper abdomen:  Stable 1.6 cm right adrenal nodule with density 42 HU compatible with an adenoma as described on 2019 PET-CT. Simple 4.1 cm upper right renal cyst. Musculoskeletal: No aggressive appearing focal osseous lesions. Moderate thoracic spondylosis. Partially visualized left shoulder arthroplasty. IMPRESSION: 1. New moderate patchy peripheral ground-glass opacities in both lungs, asymmetrically prominent in the left lung, most prominent in the left upper lobe. Findings are nonspecific, with differential including atypical/viral infection such as due to COVID-19, postinflammatory fibrosis or other inflammatory etiologies. Suggest attention on follow-up chest CT. 2. No findings suspicious for local tumor recurrence in the right lung status post right lower lobectomy. 3. No evidence of metastatic disease in the chest. 4. Stable right adrenal adenoma.  5. Aortic Atherosclerosis (ICD10-I70.0) and Emphysema (ICD10-J43.9). Electronically Signed   By: Ilona Sorrel M.D.   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   No evidence of recurrence on clinical exam today. Chest CT scan from yesterday did not show any evidence of of local tumor recurrence or metastatic disease.  I reviewed the patient's images with him.  He continues to have groundglass opacities more prominent in the left lung consistent with recent COVID-19 infection.  Plan: The patient is scheduled to follow-up with Dr. Delton Coombes on 08/30/2020.  At this time the patient would like to follow-up with Dr. Delton Coombes alone given the close proximity to his home.  He will follow up in radiation oncology as needed.  Total time spent in this encounter was 15 minutes which included reviewing the patient's most recent follow-up with Dr. Delton Coombes, chest CT scan, ED visits, hospital admission, physical examination, documentation, and ordering of future chest CT scan.  ____________________________________  Blair Promise, PhD, MD  This document serves as a record of services personally performed by Gery Pray, MD. It was created on his behalf by Clerance Lav, a trained medical scribe. The creation of this record is based on the scribe's personal observations and the provider's statements to them. This document has been checked and approved by the attending provider.

## 2020-08-30 ENCOUNTER — Ambulatory Visit (HOSPITAL_COMMUNITY): Payer: Medicare HMO | Admitting: Hematology

## 2020-09-01 ENCOUNTER — Other Ambulatory Visit: Payer: Self-pay | Admitting: Family Medicine

## 2020-09-01 ENCOUNTER — Telehealth: Payer: Self-pay

## 2020-09-01 DIAGNOSIS — K2100 Gastro-esophageal reflux disease with esophagitis, without bleeding: Secondary | ICD-10-CM

## 2020-09-01 IMAGING — CT CT CHEST W/ CM
3 of 5 series · 14 of 36 positions shown, 16 images · IV contrast (omnipaque)
Comparison: Chest CT on 11/20/2018 and PET-CT on 11/14/2017

CLINICAL DATA: Fever of unknown origin for 1 week. Nausea and
vomiting. Pancytopenia. Personal history of lung carcinoma.

EXAM:
CT CHEST, ABDOMEN, AND PELVIS WITH CONTRAST
TECHNIQUE: Multidetector CT imaging of the chest, abdomen and pelvis was
performed following the standard protocol during bolus
administration of intravenous contrast.
CONTRAST:  100mL OMNIPAQUE IOHEXOL 300 MG/ML  SOLN

[Series 2: cap with · axial · 0.79mm/px · z∈[-882,-342]mm · 8 of 140 slices shown, 10 images]
[im 16/140  mediastinal]
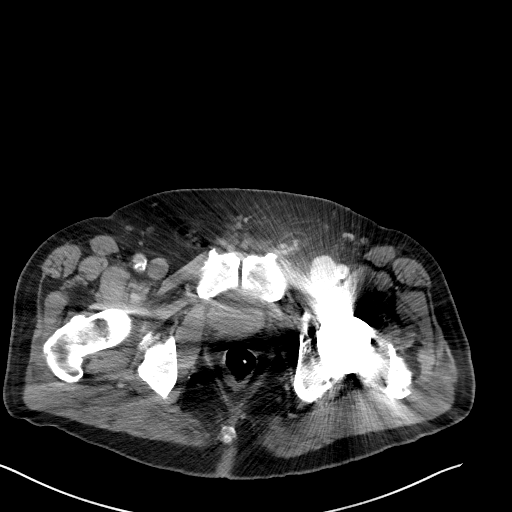
[im 16/140  lung]
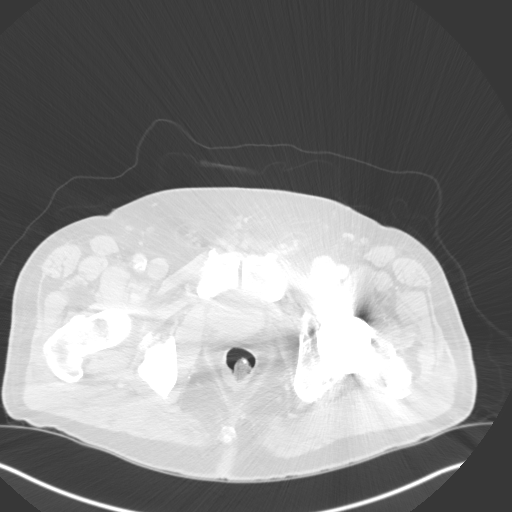
[im 31/140  lung]
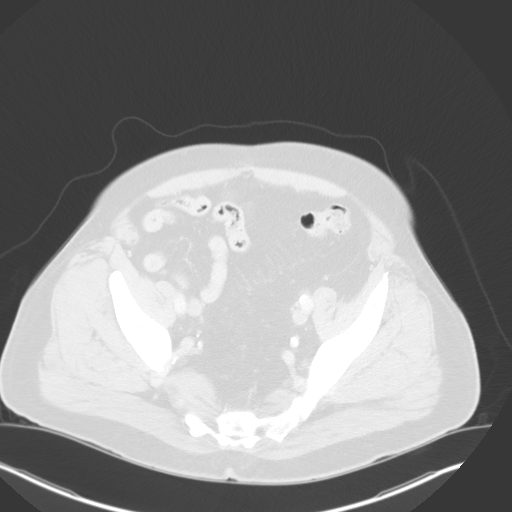
[im 47/140  lung]
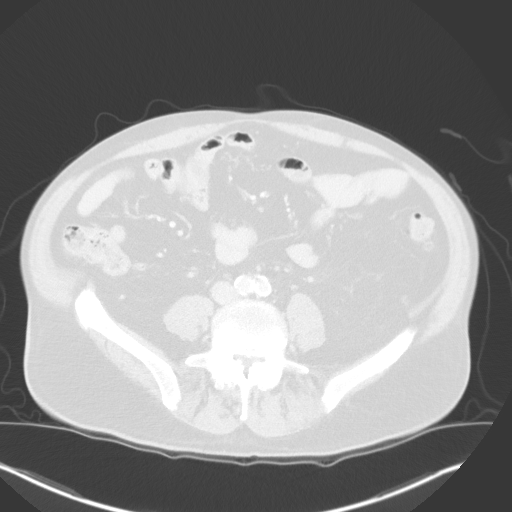
[im 62/140  lung]
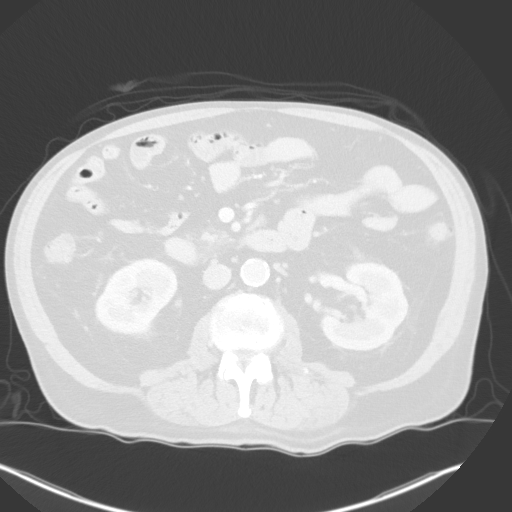
[im 78/140  mediastinal]
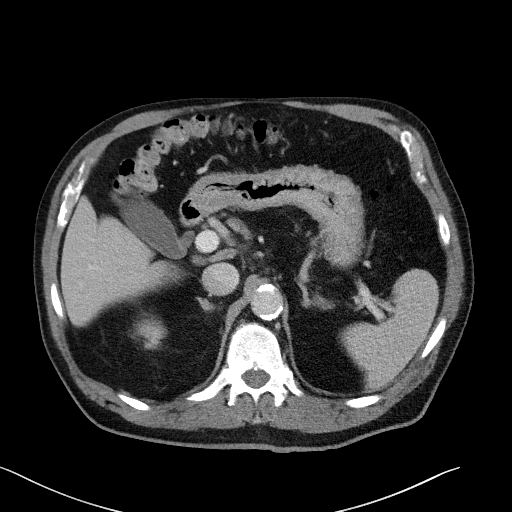
[im 78/140  lung]
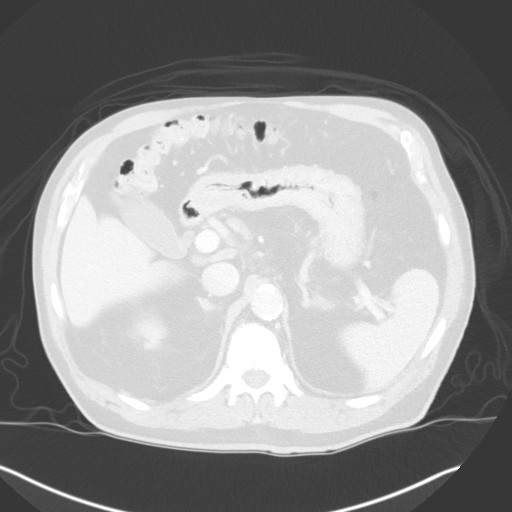
[im 93/140  lung]
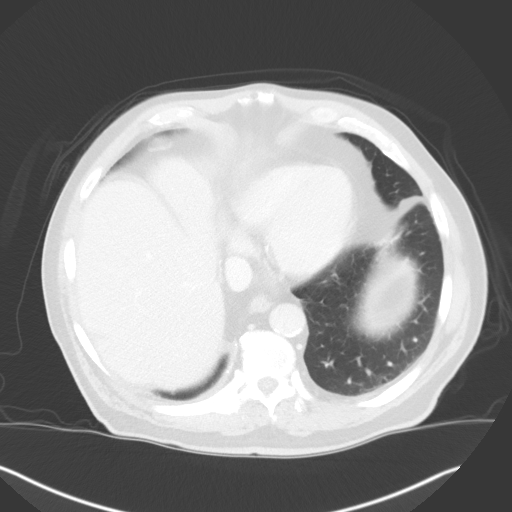
[im 109/140  lung]
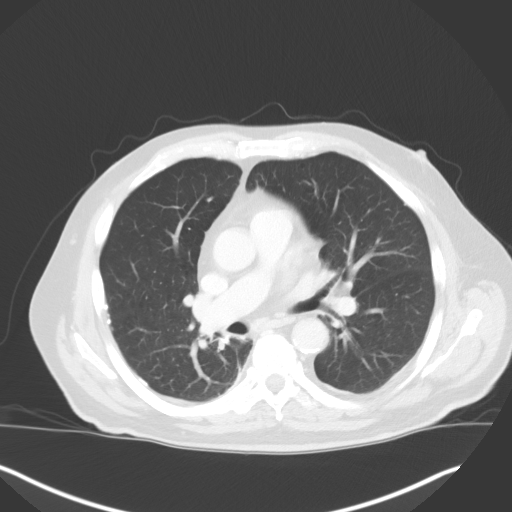
[im 124/140  lung]
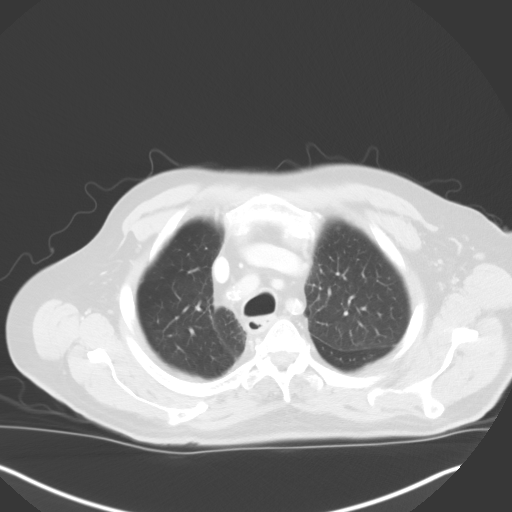

[Series 3: lung · axial · 0.79mm/px · z∈[-566,-482]mm · 3 of 166 slices shown]
[im 14/166  lung]
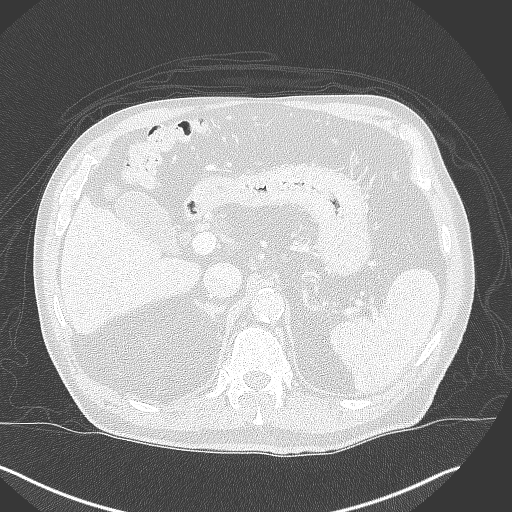
[im 42/166  lung]
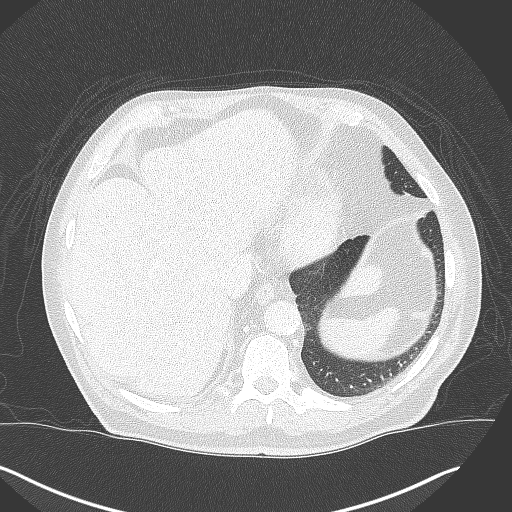
[im 56/166  lung]
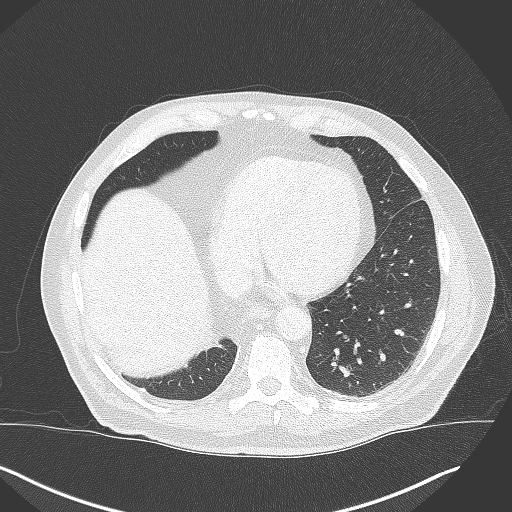

[Series 4: coronals · coronal · 0.94mm/px · 3 of 187 slices shown]
[im 38/187  lung]
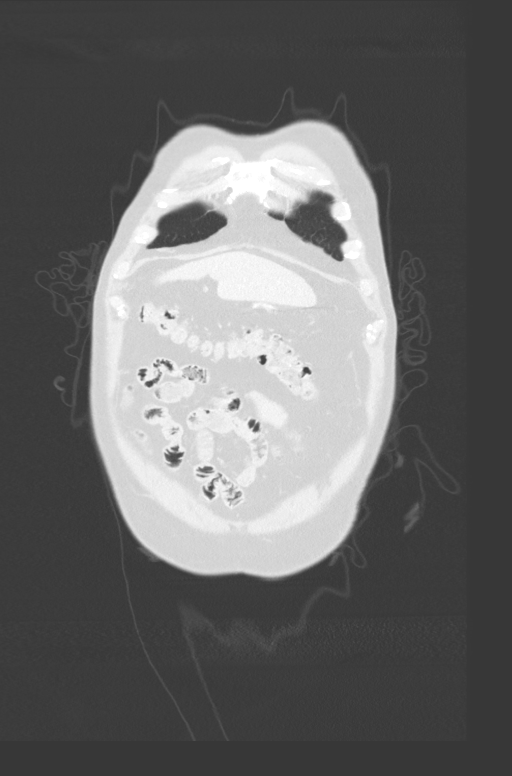
[im 75/187  lung]
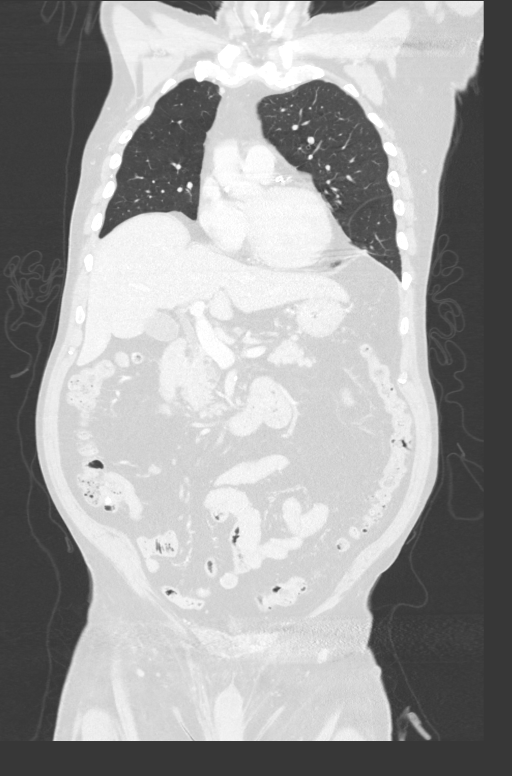
[im 112/187  lung]
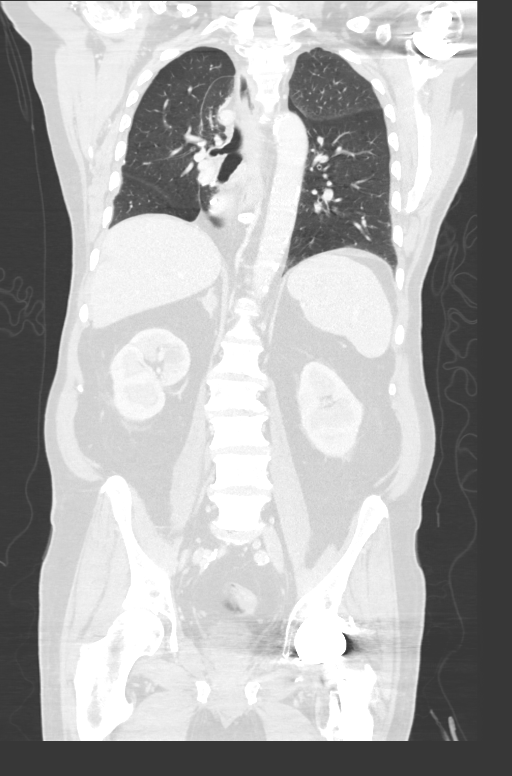

[14 of 36 positions shown; findings below may reference images not displayed]

FINDINGS: CT CHEST FINDINGS

Cardiovascular: No acute findings. Aortic and coronary artery
atherosclerosis.

Mediastinum/Lymph Nodes: No masses or pathologically enlarged lymph
nodes identified.

Lungs/Pleura: Stable postop changes from right lower lobectomy. Mild
centrilobular emphysema noted. A 6 mm pulmonary nodule in the
lingula on image 100/3 remains stable, consistent with benign
etiology. No new or enlarging pulmonary nodules or masses
identified. No evidence of pulmonary infiltrate or pleural effusion.
Mild bilateral pleural calcification is stable and consistent with
asbestos related pleural disease.

Musculoskeletal:  No suspicious bone lesions identified.

CT ABDOMEN AND PELVIS FINDINGS

Hepatobiliary: No masses identified.

Pancreas:  No mass or inflammatory changes.

Spleen: No evidence of splenomegaly. 2 small peripheral wedge-shaped
low-attenuation lesions are seen in the spleen which were not
definitely visualized on previous studies and are consistent with
small splenic infarcts.

Adrenals/Urinary tract: Stable 1.6 cm homogeneous right adrenal
mass, consistent with benign adenoma. A few right renal cysts are
also stable. No evidence of renal masses or hydronephrosis.

Stomach/Bowel: Mild colonic diverticulosis is noted, however there
is no evidence of diverticulitis. Normal appendix visualized. No
evidence of bowel obstruction, inflammatory process, or abnormal
fluid collections.

Vascular/Lymphatic: No pathologically enlarged lymph nodes
identified. Stable 3.0 cm infrarenal abdominal aortic aneurysm.

Reproductive: Clips seen from probable prior prostatectomy. Limited
visualization due to extensive beam hardening artifact from left hip
prosthesis.

Other:  None.

Musculoskeletal:  No suspicious bone lesions identified.
IMPRESSION: 1. Stable postop changes from right lower lobectomy. No evidence of
recurrent or metastatic carcinoma within the chest, abdomen, or
pelvis.
2. Two small splenic infarcts, not definitely visualized on previous
studies.
3. Stable 3.0 cm infrarenal abdominal aortic aneurysm. Recommend
followup by ultrasound in 3 years. This recommendation follows ACR
consensus guidelines: White Paper of the ACR Incidental Findings
4. Colonic diverticulosis. No radiographic evidence of
diverticulitis.

## 2020-09-01 MED ORDER — PANTOPRAZOLE SODIUM 40 MG PO TBEC: 40.0000 mg | DELAYED_RELEASE_TABLET | Freq: Two times a day (BID) | ORAL | 3 refills | Status: AC

## 2020-09-01 NOTE — Telephone Encounter (Signed)
Spoke with patient, he is taking something OTC for indigestion and it is not helping.  He is not at home at this time and he cannot remember what it is.  He said he had discussed this with you at his appointment and you mentioned you could prescribe something if the OTC did not help.  His pharmacy is [facility].

## 2020-09-01 NOTE — Progress Notes (Signed)
Have him increase his Protonix to twice daily, that should help more with the indigestion, I sent a new prescription for that

## 2020-09-01 NOTE — Progress Notes (Signed)
Patient aware.

## 2020-09-04 DIAGNOSIS — U071 COVID-19: Secondary | ICD-10-CM | POA: Diagnosis not present

## 2020-09-14 ENCOUNTER — Encounter (HOSPITAL_COMMUNITY): Payer: Self-pay | Admitting: Hematology

## 2020-09-14 ENCOUNTER — Other Ambulatory Visit: Payer: Self-pay

## 2020-09-14 ENCOUNTER — Inpatient Hospital Stay (HOSPITAL_COMMUNITY): Payer: Medicare HMO | Attending: Hematology | Admitting: Hematology

## 2020-09-14 VITALS — BP 156/62 | HR 88 | Temp 98.7°F | Resp 20 | Wt 196.0 lb

## 2020-09-14 DIAGNOSIS — C3431 Malignant neoplasm of lower lobe, right bronchus or lung: Secondary | ICD-10-CM | POA: Insufficient documentation

## 2020-09-14 DIAGNOSIS — E119 Type 2 diabetes mellitus without complications: Secondary | ICD-10-CM | POA: Insufficient documentation

## 2020-09-14 DIAGNOSIS — D509 Iron deficiency anemia, unspecified: Secondary | ICD-10-CM | POA: Diagnosis not present

## 2020-09-14 DIAGNOSIS — G479 Sleep disorder, unspecified: Secondary | ICD-10-CM | POA: Diagnosis not present

## 2020-09-14 DIAGNOSIS — I509 Heart failure, unspecified: Secondary | ICD-10-CM | POA: Insufficient documentation

## 2020-09-14 DIAGNOSIS — Z902 Acquired absence of lung [part of]: Secondary | ICD-10-CM | POA: Diagnosis not present

## 2020-09-14 DIAGNOSIS — I110 Hypertensive heart disease with heart failure: Secondary | ICD-10-CM | POA: Insufficient documentation

## 2020-09-14 DIAGNOSIS — D61818 Other pancytopenia: Secondary | ICD-10-CM | POA: Diagnosis not present

## 2020-09-14 DIAGNOSIS — Z87891 Personal history of nicotine dependence: Secondary | ICD-10-CM | POA: Diagnosis not present

## 2020-09-14 DIAGNOSIS — Z79899 Other long term (current) drug therapy: Secondary | ICD-10-CM | POA: Diagnosis not present

## 2020-09-14 DIAGNOSIS — Z8616 Personal history of COVID-19: Secondary | ICD-10-CM | POA: Diagnosis not present

## 2020-09-14 DIAGNOSIS — Z794 Long term (current) use of insulin: Secondary | ICD-10-CM | POA: Diagnosis not present

## 2020-09-14 NOTE — Progress Notes (Signed)
Timothy Guerrero, Timothy Guerrero [id number]   CLINIC:  Medical Oncology/Hematology  PCP:  Dettinger, Fransisca Kaufmann, MD [place] / [place] [address]  [phone]  REASON FOR VISIT:  Follow-up for pancytopenia  PRIOR THERAPY: Blood transfusion in 05/2016  CURRENT THERAPY: Observation  INTERVAL HISTORY:  [name]., a 80 y.o. male, returns for routine follow-up for his pancytopenia. Timothy Guerrero was last seen on 02/17/2020.  Today he reports feeling okay. He reports that his energy levels have not improved since getting infected with COVID in December 2021, but he denies having melena, hematochezia, hematuria, numbness or tingling. He also complains of having GERD symptoms since having the COVID infection in December and is taking Protonix to control his symptoms.   REVIEW OF SYSTEMS:  Review of Systems  Constitutional: Positive for appetite change (75%) and fatigue (75%).  Respiratory: Positive for shortness of breath (w/ exertion).   Gastrointestinal: Negative for blood in stool.       Indigestion  Genitourinary: Negative for hematuria.   Neurological: Positive for numbness (feet).  Psychiatric/Behavioral: Positive for sleep disturbance.  All other systems reviewed and are negative.   PAST MEDICAL/SURGICAL HISTORY:  Past Medical History:  Diagnosis Date  . Anemia   . Arthritis   . Atrial flutter ([place])    post op following lobectomy  . Benign essential tremor   . Blood transfusion without reported diagnosis   . Cataract   . CHF (congestive heart failure) ([place])   . Colon polyp    2007, polyp with early cancer, one year f/u no residual polyp. next TCS 2010, see PSH. [facility].  [name] COPD (chronic obstructive pulmonary disease) ([place])   . Diabetes mellitus without complication ([place])    10 years  . GERD (gastroesophageal reflux disease)   . History of radiation therapy 12/18/2017-01/31/2018   Right Lung; Dr. Gery Pray  . HOH (hard of hearing)   . Hypercholesteremia   . Hypertension    10 years  . Lung cancer ([place])   . Prostate cancer ([place])   . Shortness of breath dyspnea    due to lung mass  . Skin melanoma ([place])   . Sleep apnea    wears CPAP sometimes, cannot tolerate all the time.   Past Surgical History:  Procedure Laterality Date  . bottom lobe of right lung removed  12/17/14  . CARDIAC CATHETERIZATION     no PCI  . CATARACT EXTRACTION Left   . CATARACT EXTRACTION W/PHACO Left 01/03/2016   Procedure: CATARACT EXTRACTION PHACO AND INTRAOCULAR LENS PLACEMENT LEFT EYE CDE=7.78;  Surgeon: Williams Che, MD;  Location: [facility];  Service: Ophthalmology;  Laterality: Left;  . CATARACT EXTRACTION W/PHACO Right 03/20/2016   Procedure: CATARACT EXTRACTION PHACO AND INTRAOCULAR LENS PLACEMENT; CDE:  9.58;  Surgeon: Williams Che, MD;  Location: [facility];  Service: Ophthalmology;  Laterality: Right;  . COLONOSCOPY  08/2008   Dr. Leafy Half, [place]: normal, internal grade 1 hemorrhoids. next TCS 08/2013  . COLONOSCOPY N/A 03/04/2014   Procedure: COLONOSCOPY;  Surgeon: Daneil Dolin, MD;  Location: [facility];  Service: Endoscopy;  Laterality: N/A;  7:30am  . EYE SURGERY    . knee replacements     bilateral. over 10 years ago  . LOBECTOMY Right 12/17/2014   Procedure: RIGHT LOWER LOBECTOMY;  Surgeon: Melrose Nakayama, MD;  Location: [facility];  Service: Thoracic;  Laterality: Right;  . LYMPH NODE DISSECTION Right 12/17/2014   Procedure: LYMPH NODE DISSECTION;  Surgeon: Melrose Nakayama, MD;  Location: [facility];  Service: Thoracic;  Laterality: Right;  . NECK SURGERY     melanoma removed.  [name] PROSTATE SURGERY     prostatectomy, radical  . TOTAL HIP ARTHROPLASTY  2007   left  . total shoulder replacement  2011   left  . VIDEO ASSISTED THORACOSCOPY (VATS)/WEDGE RESECTION Right 12/17/2014   Procedure: RIGHT VIDEO ASSISTED THORACOSCOPY (VATS);  Surgeon: Melrose Nakayama,  MD;  Location: [facility];  Service: Thoracic;  Laterality: Right;  [name] VIDEO BRONCHOSCOPY Bilateral 11/17/2016   Procedure: VIDEO BRONCHOSCOPY WITHOUT FLUORO;  Surgeon: Javier Glazier, MD;  Location: [facility];  Service: Cardiopulmonary;  Laterality: Bilateral;  . VIDEO BRONCHOSCOPY N/A 11/01/2017   Procedure: VIDEO BRONCHOSCOPY;  Surgeon: Melrose Nakayama, MD;  Location: [facility];  Service: Thoracic;  Laterality: N/A;    SOCIAL HISTORY:  Social History   Socioeconomic History  . Marital status: Divorced    Spouse name: Not on file  . Number of children: 3  . Years of education: associates  . Highest education level: Associate degree: occupational, Hotel manager, or vocational program  Occupational History  . Occupation: retired    Comment: truck Geophysicist/field seismologist  Tobacco Use  . Smoking status: Former Smoker    Packs/day: 2.00    Years: 32.00    Pack years: 64.00    Types: Cigarettes    Quit date: 07/03/1986    Years since quitting: 34.2  . Smokeless tobacco: Never Used  Vaping Use  . Vaping Use: Never used  Substance and Sexual Activity  . Alcohol use: Yes    Alcohol/week: 0.0 standard drinks    Comment: rarely wine  . Drug use: No  . Sexual activity: Yes  Other Topics Concern  . Not on file  Social History Narrative   [employer].  Lives alone.      [facility]:   Originally from [place]. Served in [employer] with significant exposure to asbestos. 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 He has also lived in [place], [place], & [place]. He has lived in [place] since 1979. 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 No pets currently. Remote bird exposure.       Patient is divorced but has had a girlfriend for 7 months. He enjoys going dancing several times a week. He had 3 children but one passed away at 22 from prostate cancer. He has 17 grandchildren.    Social Determinants of Health   Financial Resource Strain: Not on file  Food Insecurity: Not on file  Transportation Needs: Not on file  Physical  Activity: Not on file  Stress: Not on file  Social Connections: Not on file  Intimate Partner Violence: Not on file    FAMILY HISTORY:  Family History  Problem Relation Age of Onset  . Cancer Mother        unknown type  . COPD Sister   . Breast cancer Sister   . Cancer Sister 62       breast  . Heart attack Daughter   . Prostate cancer Son   . Colon cancer Neg Hx     CURRENT MEDICATIONS:  Current Outpatient Medications  Medication Sig Dispense Refill  . acetaminophen (TYLENOL) 325 MG tablet Take 2 tablets (650 mg total) by mouth every 6 (six) hours as needed for mild pain, fever or headache (or Fever >/=  101). 12 tablet 1  . albuterol (PROVENTIL HFA;VENTOLIN HFA) 108 (90 Base) MCG/ACT inhaler Inhale 1 puff into the lungs every 6 (six) hours as needed for wheezing or shortness of breath.    [name] amLODipine (NORVASC) 10 MG tablet Take 1 tablet (10 mg total) by mouth daily. 90 tablet 3  . ASCORBIC ACID PO Take 1 tablet by mouth daily.    . cholecalciferol (VITAMIN D3) 25 MCG (1000 UT) tablet Take 1,000 Units by mouth daily.    . clonazePAM (KLONOPIN) 0.5 MG tablet Take 1-2 tablets (0.5-1 mg total) by mouth once for 1 dose. 2 tablet 0  . Cyanocobalamin (VITAMIN B-12 PO) Take 1 tablet by mouth daily.    [name] ezetimibe (ZETIA) 10 MG tablet Take 1 tablet (10 mg total) by mouth daily. (Patient not taking: Reported on 04/01/2020) 90 tablet 3  . fluticasone furoate-vilanterol (BREO ELLIPTA) 200-25 MCG/INH AEPB Inhale 1 puff into the lungs daily. 1 each 6  . furosemide (LASIX) 20 MG tablet Take 1 tablet (20 mg total) by mouth daily. 90 tablet 3  . gabapentin (NEURONTIN) 300 MG capsule Take 300 mg by mouth 3 (three) times daily.    . insulin aspart protamine- aspart (NOVOLOG MIX 70/30) (70-30) 100 UNIT/ML injection Inject 50 Units into the skin 2 (two) times daily.     [name] levothyroxine (SYNTHROID) 50 MCG tablet Take 1 tablet (50 mcg total) by mouth daily before breakfast. ((for thyroid)) 90 tablet 3   . losartan (COZAAR) 50 MG tablet Take 25 mg by mouth daily.     . magnesium oxide (MAG-OX) 400 MG tablet Take 400 mg by mouth 2 (two) times daily.    . metFORMIN (GLUCOPHAGE) 1000 MG tablet Take 1 tablet (1,000 mg total) by mouth 2 (two) times daily with a meal. Needs to be seen before next refill. 180 tablet 3  . metoprolol (TOPROL-XL) 200 MG 24 hr tablet Take 1 tablet (200 mg total) by mouth daily. 90 tablet 3  . mupirocin ointment (BACTROBAN) 2 % APPLY TO AFFECTED AREA EVERY DAY    . pantoprazole (PROTONIX) 40 MG tablet Take 1 tablet (40 mg total) by mouth 2 (two) times daily before a meal. 180 tablet 3  . Potassium Chloride ER 20 MEQ TBCR Take 20 mEq by mouth daily. Take only on days when taking lasix 90 tablet 3  . rosuvastatin (CRESTOR) 40 MG tablet Take 1 tablet (40 mg total) by mouth daily. 90 tablet 3  . Semaglutide, 1 MG/DOSE, (OZEMPIC, 1 MG/DOSE,) 2 MG/1.5ML SOPN Inject 1 mg into the skin once a week. 1 pen 3  . Tiotropium Bromide-Olodaterol (STIOLTO RESPIMAT IN) Inhale 1 puff into the lungs daily as needed (shortness of breath).    . triamcinolone cream (KENALOG) 0.1 % Apply 1 application topically 2 (two) times daily. 30 g 0  . valACYclovir (VALTREX) 1000 MG tablet Take 1,000 mg by mouth daily as needed (cold sores).      No current facility-administered medications for this visit.    ALLERGIES:  No Known Allergies  PHYSICAL EXAM:  Performance status (ECOG): 1 - Symptomatic but completely ambulatory  Vitals:   09/14/20 1103  BP: (!) 156/62  Pulse: 88  Resp: 20  Temp: 98.7 F (37.1 C)  SpO2: 100%   Wt Readings from Last 3 Encounters:  09/14/20 196 lb (88.9 kg)  08/26/20 193 lb (87.5 kg)  07/15/20 193 lb (87.5 kg)   Physical Exam Vitals reviewed.  Constitutional:      Appearance:  Normal appearance.  Cardiovascular:     Rate and Rhythm: Normal rate and regular rhythm.     Pulses: Normal pulses.     Heart sounds: Normal heart sounds.  Pulmonary:     Effort:  Pulmonary effort is normal.     Breath sounds: Normal breath sounds.  Abdominal:     Palpations: Abdomen is soft. There is no hepatomegaly, splenomegaly or mass.     Tenderness: There is no abdominal tenderness.     Hernia: No hernia is present.  Musculoskeletal:     Right lower leg: No edema.     Left lower leg: No edema.  Neurological:     General: No focal deficit present.     Mental Status: He is alert and oriented to person, place, and time.  Psychiatric:        Mood and Affect: Mood normal.        Behavior: Behavior normal.     LABORATORY DATA:  I have reviewed the labs as listed.  CBC Latest Ref Rng & Units 08/13/2020 07/15/2020 06/27/2020  WBC 4.0 - 10.5 K/uL 6.5 9.3 6.2  Hemoglobin 13.0 - 17.0 g/dL 9.2(L) 10.0(L) 9.0(L)  Hematocrit 39.0 - 52.0 % 32.3(L) 35.2(L) 32.5(L)  Platelets 150 - 400 K/uL 324 383 217   CMP Latest Ref Rng & Units 08/13/2020 07/15/2020 06/27/2020  Glucose 70 - 99 mg/dL 290(H) 261(H) 297(H)  BUN 8 - 23 mg/dL 13 10 16   Creatinine 0.61 - 1.24 mg/dL 0.86 1.08 0.90  Sodium 135 - 145 mmol/L 138 146(H) 137  Potassium 3.5 - 5.1 mmol/L 4.0 4.3 4.4  Chloride 98 - 111 mmol/L 101 104 103  CO2 22 - 32 mmol/L 26 27 25   Calcium 8.9 - 10.3 mg/dL 9.5 9.6 8.1(L)  Total Protein 6.5 - 8.1 g/dL 6.9 6.1 6.3(L)  Total Bilirubin 0.3 - 1.2 mg/dL 0.7 0.3 0.5  Alkaline Phos 38 - 126 U/L 51 68 41  AST 15 - 41 U/L 21 13 21   ALT 0 - 44 U/L 12 6 13       Component Value Date/Time   RBC 4.23 08/13/2020 1219   MCV 76.4 (L) 08/13/2020 1219   MCV 75 (L) 07/15/2020 1149   MCH 21.7 (L) 08/13/2020 1219   MCHC 28.5 (L) 08/13/2020 1219   RDW 20.2 (H) 08/13/2020 1219   RDW 19.7 (H) 07/15/2020 1149   LYMPHSABS 1.0 08/13/2020 1219   LYMPHSABS 1.2 07/15/2020 1149   MONOABS 0.8 08/13/2020 1219   EOSABS 0.2 08/13/2020 1219   EOSABS 0.3 07/15/2020 1149   BASOSABS 0.1 08/13/2020 1219   BASOSABS 0.0 07/15/2020 1149   Lab Results  Component Value Date   TIBC 479 (H) 08/13/2020    TIBC 317 03/10/2019   FERRITIN 8 (L) 08/13/2020   FERRITIN 35 06/27/2020   FERRITIN 33 06/26/2020   IRONPCTSAT 2 (L) 08/13/2020   IRONPCTSAT 9 (L) 03/10/2019    DIAGNOSTIC IMAGING:  I have independently reviewed the scans and discussed with the patient. No results found.   ASSESSMENT:  1. Pancytopenia: -Hospital admission from 03/09/2019 through 03/11/2019 with fevers, found to have severe pancytopenia. Extensive work-up for nutritional deficiencies, infectious causes were negative. -Last blood transfusion in November 2017 when hemoglobin dropped to 6.6. -We reviewed his CBC. White count improved to 4.7 with normal ANC. Platelet count also normalized. Slightly anemic which is stable. -No further work-up needed. We will recheck his CBC in 6 months.  2. Right lung cancer: -Underwent right lower lobectomy on 12/17/2014  showing 2.8 cm squamous cell carcinoma, pT1b, PN 0, margins negative, no visceral pleural invasion. -Bronchial washings for hemoptysis on 11/02/2017 showed malignant cells consistent with non-small cell lung cancer. -He underwent radiation from 12/18/2017 through 01/31/2018 for recurrent lung cancer versus new primary tracheal carcinoma. -Most recent CT of the chest without contrast on 06/04/2019 showed no evidence of recurrence or metastatic disease.   PLAN:  1. Microcytic anemia: -CBC shows hemoglobin 9.2 with MCV 76. -Ferritin is 8 with percent saturation of 2.  Creatinine is 0.86. -Recommend Venofer 300 mg - 300 mg-400 mg. -RTC 6 months for follow-up with repeat labs.  2. Right lung cancer: -We have reviewed CT chest from 08/13/2020 which showed new moderate patchy peripheral groundglass opacities in both lungs, asymmetrically prominent in the left lung from previous Covid infection.  No findings suspicious for tumor recurrence. -RTC 6 months.  Will consider CT in 1 year.  Orders placed this encounter:  Orders Placed This Encounter  Procedures  . CBC with  Differential/Platelet  . Comprehensive metabolic panel  . Ferritin  . Iron and TIBC     Derek Jack, MD [place] [phone]   I, Milinda Antis, am acting as a scribe for Dr. Sanda Linger.  I, Derek Jack MD, have reviewed the above documentation for accuracy and completeness, and I agree with the above.

## 2020-09-14 NOTE — Patient Instructions (Signed)
[place] at [facility] Discharge Instructions  You were seen today by Dr. Delton Coombes. He went over your recent results and scans. You will be scheduled to have 2 iron infusions given 1 week apart. Dr. Delton Coombes will see you back in 6 months for labs and follow up.   Thank you for choosing [facility] at [facility] to provide your oncology and hematology care.  To afford each patient quality time with our provider, please arrive at least 15 minutes before your scheduled appointment time.   If you have a lab appointment with the [facility] please come in thru the Main Entrance and check in at the main information desk  You need to re-schedule your appointment should you arrive 10 or more minutes late.  We strive to give you quality time with our providers, and arriving late affects you and other patients whose appointments are after yours.  Also, if you no show three or more times for appointments you may be dismissed from the clinic at the providers discretion.     Again, thank you for choosing [facility].  Our hope is that these requests will decrease the amount of time that you wait before being seen by our physicians.       _____________________________________________________________  Should you have questions after your visit to [facility], please contact our office at [phone] between the hours of 8:00 a.m. and 4:30 p.m.  Voicemails left after 4:00 p.m. will not be returned until the following business day.  For prescription refill requests, have your pharmacy contact our office and allow 72 hours.    Cancer Center Support Programs:   > Cancer Support Group  2nd Tuesday of the month 1pm-2pm, Journey Room

## 2020-09-14 NOTE — Progress Notes (Signed)
Orders placed for:  Venofer 300 mg IVPB x 2 doses and then follow with Venofer 400 mg IVPB x 1 dose.  T.O. Dr Rhys Martini, PharmD 09/14/20 @ 1500

## 2020-09-16 ENCOUNTER — Inpatient Hospital Stay (HOSPITAL_COMMUNITY): Payer: Medicare HMO

## 2020-09-16 ENCOUNTER — Other Ambulatory Visit: Payer: Self-pay

## 2020-09-16 ENCOUNTER — Encounter (HOSPITAL_COMMUNITY): Payer: Self-pay

## 2020-09-16 VITALS — BP 152/69 | HR 81 | Temp 98.4°F | Resp 18

## 2020-09-16 DIAGNOSIS — Z794 Long term (current) use of insulin: Secondary | ICD-10-CM | POA: Diagnosis not present

## 2020-09-16 DIAGNOSIS — G479 Sleep disorder, unspecified: Secondary | ICD-10-CM | POA: Diagnosis not present

## 2020-09-16 DIAGNOSIS — I509 Heart failure, unspecified: Secondary | ICD-10-CM | POA: Diagnosis not present

## 2020-09-16 DIAGNOSIS — Z8616 Personal history of COVID-19: Secondary | ICD-10-CM | POA: Diagnosis not present

## 2020-09-16 DIAGNOSIS — E119 Type 2 diabetes mellitus without complications: Secondary | ICD-10-CM | POA: Diagnosis not present

## 2020-09-16 DIAGNOSIS — I110 Hypertensive heart disease with heart failure: Secondary | ICD-10-CM | POA: Diagnosis not present

## 2020-09-16 DIAGNOSIS — Z87891 Personal history of nicotine dependence: Secondary | ICD-10-CM | POA: Diagnosis not present

## 2020-09-16 DIAGNOSIS — D509 Iron deficiency anemia, unspecified: Secondary | ICD-10-CM | POA: Diagnosis not present

## 2020-09-16 DIAGNOSIS — Z79899 Other long term (current) drug therapy: Secondary | ICD-10-CM | POA: Diagnosis not present

## 2020-09-16 DIAGNOSIS — C3431 Malignant neoplasm of lower lobe, right bronchus or lung: Secondary | ICD-10-CM | POA: Diagnosis not present

## 2020-09-16 MED ORDER — SODIUM CHLORIDE 0.9 % IV SOLN: Freq: Once | INTRAVENOUS | Status: AC

## 2020-09-16 MED ORDER — SODIUM CHLORIDE 0.9 % IV SOLN
400.0000 mg | Freq: Once | INTRAVENOUS | Status: AC
  Administered 2020-09-16: 400 mg via INTRAVENOUS
  Filled 2020-09-16: qty 20

## 2020-09-16 NOTE — Progress Notes (Signed)
Pt here for iron today.  He wanted the 400 mg dose today since it was the longest.   Tolerated iron infusion well today without incidence.  Vital signs stable prior to discharge.  Discharged in stable condition ambulatory.

## 2020-09-16 NOTE — Patient Instructions (Signed)
[place] at [facility]  Discharge Instructions:  Iron infusion today.  Follow up as scheduled. _______________________________________________________________  Thank you for choosing [place] at [facility] to provide your oncology and hematology care.  To afford each patient quality time with our providers, please arrive at least 15 minutes before your scheduled appointment.  You need to re-schedule your appointment if you arrive 10 or more minutes late.  We strive to give you quality time with our providers, and arriving late affects you and other patients whose appointments are after yours.  Also, if you no show three or more times for appointments you may be dismissed from the clinic.  Again, thank you for choosing [place] at [facility] hope is that these requests will allow you access to exceptional care and in a timely manner. _______________________________________________________________  If you have questions after your visit, please contact our office at [phone] between the hours of 8:30 a.m. and 5:00 p.m. Voicemails left after 4:30 p.m. will not be returned until the following business day. _______________________________________________________________  For prescription refill requests, have your pharmacy contact our office. _______________________________________________________________  Recommendations made by the consultant and any test results will be sent to your referring physician. _______________________________________________________________

## 2020-09-20 ENCOUNTER — Ambulatory Visit (HOSPITAL_COMMUNITY): Payer: No Typology Code available for payment source

## 2020-09-24 ENCOUNTER — Ambulatory Visit (HOSPITAL_COMMUNITY): Payer: No Typology Code available for payment source

## 2020-10-01 DEATH — deceased

## 2021-03-15 ENCOUNTER — Other Ambulatory Visit (HOSPITAL_COMMUNITY): Payer: No Typology Code available for payment source

## 2021-03-22 ENCOUNTER — Ambulatory Visit (HOSPITAL_COMMUNITY): Payer: No Typology Code available for payment source | Admitting: Hematology
